# Patient Record
Sex: Female | Born: 1935
Health system: Southern US, Community
[De-identification: ages and names within clinical notes are randomized; demographics above are authoritative.]

## PROBLEM LIST (undated history)

## (undated) DIAGNOSIS — J45909 Unspecified asthma, uncomplicated: Secondary | ICD-10-CM

## (undated) DIAGNOSIS — C50919 Malignant neoplasm of unspecified site of unspecified female breast: Secondary | ICD-10-CM

## (undated) DIAGNOSIS — R609 Edema, unspecified: Secondary | ICD-10-CM

## (undated) DIAGNOSIS — T7840XA Allergy, unspecified, initial encounter: Secondary | ICD-10-CM

## (undated) DIAGNOSIS — I10 Essential (primary) hypertension: Secondary | ICD-10-CM

## (undated) DIAGNOSIS — K219 Gastro-esophageal reflux disease without esophagitis: Secondary | ICD-10-CM

## (undated) DIAGNOSIS — F329 Major depressive disorder, single episode, unspecified: Secondary | ICD-10-CM

## (undated) DIAGNOSIS — E785 Hyperlipidemia, unspecified: Secondary | ICD-10-CM

## (undated) DIAGNOSIS — F32A Depression, unspecified: Secondary | ICD-10-CM

## (undated) DIAGNOSIS — C801 Malignant (primary) neoplasm, unspecified: Secondary | ICD-10-CM

## (undated) DIAGNOSIS — Z923 Personal history of irradiation: Secondary | ICD-10-CM

## (undated) DIAGNOSIS — M109 Gout, unspecified: Secondary | ICD-10-CM

## (undated) HISTORY — DX: Hyperlipidemia, unspecified: E78.5

## (undated) HISTORY — PX: BREAST EXCISIONAL BIOPSY: SUR124

## (undated) HISTORY — DX: Unspecified asthma, uncomplicated: J45.909

## (undated) HISTORY — PX: BREAST BIOPSY: SHX20

## (undated) HISTORY — PX: BREAST LUMPECTOMY: SHX2

## (undated) HISTORY — DX: Gout, unspecified: M10.9

## (undated) HISTORY — DX: Edema, unspecified: R60.9

## (undated) HISTORY — DX: Gastro-esophageal reflux disease without esophagitis: K21.9

## (undated) HISTORY — PX: VAGINAL HYSTERECTOMY: SUR661

## (undated) HISTORY — DX: Essential (primary) hypertension: I10

## (undated) HISTORY — DX: Allergy, unspecified, initial encounter: T78.40XA

## (undated) HISTORY — DX: Depression, unspecified: F32.A

## (undated) HISTORY — DX: Major depressive disorder, single episode, unspecified: F32.9

---

## 2004-01-22 ENCOUNTER — Ambulatory Visit: Payer: Self-pay | Admitting: Internal Medicine

## 2004-04-30 ENCOUNTER — Ambulatory Visit: Payer: Self-pay | Admitting: Internal Medicine

## 2004-05-16 ENCOUNTER — Ambulatory Visit: Payer: Self-pay | Admitting: Internal Medicine

## 2005-02-10 ENCOUNTER — Ambulatory Visit: Payer: Self-pay | Admitting: Internal Medicine

## 2005-02-13 ENCOUNTER — Ambulatory Visit: Payer: Self-pay | Admitting: Internal Medicine

## 2006-01-18 ENCOUNTER — Ambulatory Visit: Payer: Self-pay | Admitting: Internal Medicine

## 2006-02-24 ENCOUNTER — Ambulatory Visit: Payer: Self-pay

## 2006-02-25 ENCOUNTER — Inpatient Hospital Stay: Payer: Self-pay | Admitting: Internal Medicine

## 2006-04-01 ENCOUNTER — Ambulatory Visit: Payer: Self-pay | Admitting: Internal Medicine

## 2006-05-06 ENCOUNTER — Ambulatory Visit: Payer: Self-pay | Admitting: Internal Medicine

## 2007-03-30 ENCOUNTER — Ambulatory Visit: Payer: Self-pay | Admitting: Gastroenterology

## 2007-04-05 ENCOUNTER — Ambulatory Visit: Payer: Self-pay | Admitting: Internal Medicine

## 2007-04-12 ENCOUNTER — Ambulatory Visit: Payer: Self-pay | Admitting: Gastroenterology

## 2008-04-19 ENCOUNTER — Ambulatory Visit: Payer: Self-pay | Admitting: Internal Medicine

## 2008-07-12 ENCOUNTER — Ambulatory Visit: Payer: Self-pay | Admitting: Podiatry

## 2010-03-10 ENCOUNTER — Ambulatory Visit: Payer: Self-pay | Admitting: Family Medicine

## 2010-03-27 ENCOUNTER — Ambulatory Visit: Payer: Self-pay | Admitting: Family Medicine

## 2010-04-09 ENCOUNTER — Ambulatory Visit: Payer: Self-pay | Admitting: Family Medicine

## 2010-04-20 DIAGNOSIS — C801 Malignant (primary) neoplasm, unspecified: Secondary | ICD-10-CM

## 2010-04-20 HISTORY — DX: Malignant (primary) neoplasm, unspecified: C80.1

## 2010-04-29 ENCOUNTER — Ambulatory Visit: Payer: Self-pay | Admitting: Surgery

## 2010-05-06 ENCOUNTER — Ambulatory Visit: Payer: Self-pay | Admitting: Surgery

## 2010-05-23 ENCOUNTER — Ambulatory Visit: Payer: Self-pay | Admitting: Surgery

## 2010-05-23 LAB — PATHOLOGY REPORT

## 2010-05-27 LAB — PATHOLOGY REPORT

## 2010-06-05 ENCOUNTER — Ambulatory Visit: Payer: Self-pay | Admitting: Internal Medicine

## 2010-06-06 LAB — CANCER ANTIGEN 27.29: CA 27.29: 11 U/mL (ref 0.0–38.6)

## 2010-06-19 ENCOUNTER — Ambulatory Visit: Payer: Self-pay | Admitting: Internal Medicine

## 2010-07-20 ENCOUNTER — Ambulatory Visit: Payer: Self-pay | Admitting: Internal Medicine

## 2010-08-19 ENCOUNTER — Ambulatory Visit: Payer: Self-pay | Admitting: Internal Medicine

## 2010-09-05 LAB — CANCER ANTIGEN 27.29: CA 27.29: 16.4 U/mL (ref 0.0–38.6)

## 2010-09-19 ENCOUNTER — Ambulatory Visit: Payer: Self-pay | Admitting: Internal Medicine

## 2010-10-19 ENCOUNTER — Ambulatory Visit: Payer: Self-pay | Admitting: Internal Medicine

## 2010-11-17 ENCOUNTER — Emergency Department: Payer: Self-pay | Admitting: Emergency Medicine

## 2010-11-21 ENCOUNTER — Ambulatory Visit: Payer: Self-pay | Admitting: Gastroenterology

## 2010-11-25 LAB — PATHOLOGY REPORT

## 2011-01-08 ENCOUNTER — Ambulatory Visit: Payer: Self-pay | Admitting: Internal Medicine

## 2011-01-09 LAB — CANCER ANTIGEN 27.29: CA 27.29: 10.3 U/mL (ref 0.0–38.6)

## 2011-01-19 ENCOUNTER — Ambulatory Visit: Payer: Self-pay | Admitting: Internal Medicine

## 2011-01-21 ENCOUNTER — Ambulatory Visit: Payer: Self-pay | Admitting: Surgery

## 2011-01-21 DIAGNOSIS — I10 Essential (primary) hypertension: Secondary | ICD-10-CM

## 2011-01-22 ENCOUNTER — Ambulatory Visit: Payer: Self-pay | Admitting: Internal Medicine

## 2011-01-28 ENCOUNTER — Ambulatory Visit: Payer: Self-pay | Admitting: Surgery

## 2011-03-30 ENCOUNTER — Ambulatory Visit: Payer: Self-pay | Admitting: Internal Medicine

## 2011-04-29 DIAGNOSIS — F329 Major depressive disorder, single episode, unspecified: Secondary | ICD-10-CM | POA: Diagnosis not present

## 2011-04-29 DIAGNOSIS — F4321 Adjustment disorder with depressed mood: Secondary | ICD-10-CM | POA: Diagnosis not present

## 2011-06-23 DIAGNOSIS — M722 Plantar fascial fibromatosis: Secondary | ICD-10-CM | POA: Diagnosis not present

## 2011-06-23 DIAGNOSIS — M216X9 Other acquired deformities of unspecified foot: Secondary | ICD-10-CM | POA: Diagnosis not present

## 2011-06-23 DIAGNOSIS — M76829 Posterior tibial tendinitis, unspecified leg: Secondary | ICD-10-CM | POA: Diagnosis not present

## 2011-07-14 DIAGNOSIS — M76829 Posterior tibial tendinitis, unspecified leg: Secondary | ICD-10-CM | POA: Diagnosis not present

## 2011-07-14 DIAGNOSIS — M216X9 Other acquired deformities of unspecified foot: Secondary | ICD-10-CM | POA: Diagnosis not present

## 2011-07-14 DIAGNOSIS — M722 Plantar fascial fibromatosis: Secondary | ICD-10-CM | POA: Diagnosis not present

## 2011-07-16 ENCOUNTER — Ambulatory Visit: Payer: Self-pay | Admitting: Internal Medicine

## 2011-07-16 DIAGNOSIS — M1990 Unspecified osteoarthritis, unspecified site: Secondary | ICD-10-CM | POA: Diagnosis not present

## 2011-07-16 DIAGNOSIS — I10 Essential (primary) hypertension: Secondary | ICD-10-CM | POA: Diagnosis not present

## 2011-07-16 DIAGNOSIS — M109 Gout, unspecified: Secondary | ICD-10-CM | POA: Diagnosis not present

## 2011-07-16 DIAGNOSIS — J45909 Unspecified asthma, uncomplicated: Secondary | ICD-10-CM | POA: Diagnosis not present

## 2011-07-16 DIAGNOSIS — Z90710 Acquired absence of both cervix and uterus: Secondary | ICD-10-CM | POA: Diagnosis not present

## 2011-07-16 DIAGNOSIS — C50919 Malignant neoplasm of unspecified site of unspecified female breast: Secondary | ICD-10-CM | POA: Diagnosis not present

## 2011-07-16 DIAGNOSIS — Z79899 Other long term (current) drug therapy: Secondary | ICD-10-CM | POA: Diagnosis not present

## 2011-07-16 DIAGNOSIS — Z79811 Long term (current) use of aromatase inhibitors: Secondary | ICD-10-CM | POA: Diagnosis not present

## 2011-07-16 DIAGNOSIS — Z170 Estrogen receptor positive status [ER+]: Secondary | ICD-10-CM | POA: Diagnosis not present

## 2011-07-16 DIAGNOSIS — E669 Obesity, unspecified: Secondary | ICD-10-CM | POA: Diagnosis not present

## 2011-07-16 LAB — CBC CANCER CENTER
Basophil #: 0 x10 3/mm (ref 0.0–0.1)
Basophil %: 0.3 %
Eosinophil #: 0 x10 3/mm (ref 0.0–0.7)
Eosinophil %: 0.3 %
HCT: 37.5 % (ref 35.0–47.0)
HGB: 12.5 g/dL (ref 12.0–16.0)
Lymphocyte #: 2.1 x10 3/mm (ref 1.0–3.6)
Lymphocyte %: 36 %
MCH: 32.1 pg (ref 26.0–34.0)
MCHC: 33.3 g/dL (ref 32.0–36.0)
MCV: 96.5 fL (ref 80–100)
Monocyte #: 0.5 x10 3/mm (ref 0.0–0.7)
Monocyte %: 8.7 %
Neutrophil #: 3.1 x10 3/mm (ref 1.4–6.5)
Neutrophil %: 54.7 %
Platelet: 314 x10 3/mm (ref 150–440)
RBC: 3.89 10*6/uL (ref 3.80–5.20)
RDW: 13 % (ref 11.5–14.5)
WBC: 5.7 x10 3/mm (ref 3.6–11.0)

## 2011-07-16 LAB — COMPREHENSIVE METABOLIC PANEL
Albumin: 3.5 g/dL (ref 3.4–5.0)
Alkaline Phosphatase: 124 U/L (ref 50–136)
Anion Gap: 6 — ABNORMAL LOW (ref 7–16)
BUN: 17 mg/dL (ref 7–18)
Bilirubin,Total: 0.6 mg/dL (ref 0.2–1.0)
Calcium, Total: 8.5 mg/dL (ref 8.5–10.1)
Chloride: 105 mmol/L (ref 98–107)
Co2: 32 mmol/L (ref 21–32)
Creatinine: 0.93 mg/dL (ref 0.60–1.30)
EGFR (African American): >60
EGFR (Non-African Amer.): >60
Glucose: 132 mg/dL — ABNORMAL HIGH (ref 65–99)
Osmolality: 288 (ref 275–301)
Potassium: 3.9 mmol/L (ref 3.5–5.1)
SGOT(AST): 19 U/L (ref 15–37)
SGPT (ALT): 29 U/L
Sodium: 143 mmol/L (ref 136–145)
Total Protein: 6.8 g/dL (ref 6.4–8.2)

## 2011-07-20 ENCOUNTER — Ambulatory Visit: Payer: Self-pay | Admitting: Internal Medicine

## 2011-07-20 LAB — CANCER ANTIGEN 27.29: CA 27.29: 17.9 U/mL (ref 0.0–38.6)

## 2011-08-04 DIAGNOSIS — M76829 Posterior tibial tendinitis, unspecified leg: Secondary | ICD-10-CM | POA: Diagnosis not present

## 2011-08-04 DIAGNOSIS — M722 Plantar fascial fibromatosis: Secondary | ICD-10-CM | POA: Diagnosis not present

## 2011-08-11 DIAGNOSIS — M76829 Posterior tibial tendinitis, unspecified leg: Secondary | ICD-10-CM | POA: Diagnosis not present

## 2011-08-11 DIAGNOSIS — M216X9 Other acquired deformities of unspecified foot: Secondary | ICD-10-CM | POA: Diagnosis not present

## 2011-10-06 DIAGNOSIS — Z961 Presence of intraocular lens: Secondary | ICD-10-CM | POA: Diagnosis not present

## 2011-10-06 DIAGNOSIS — H2510 Age-related nuclear cataract, unspecified eye: Secondary | ICD-10-CM | POA: Diagnosis not present

## 2011-10-07 DIAGNOSIS — C50919 Malignant neoplasm of unspecified site of unspecified female breast: Secondary | ICD-10-CM | POA: Diagnosis not present

## 2011-10-27 DIAGNOSIS — M76829 Posterior tibial tendinitis, unspecified leg: Secondary | ICD-10-CM | POA: Diagnosis not present

## 2011-10-27 DIAGNOSIS — M216X9 Other acquired deformities of unspecified foot: Secondary | ICD-10-CM | POA: Diagnosis not present

## 2011-12-01 DIAGNOSIS — E785 Hyperlipidemia, unspecified: Secondary | ICD-10-CM | POA: Diagnosis not present

## 2011-12-01 DIAGNOSIS — I10 Essential (primary) hypertension: Secondary | ICD-10-CM | POA: Diagnosis not present

## 2011-12-07 DIAGNOSIS — M79609 Pain in unspecified limb: Secondary | ICD-10-CM | POA: Diagnosis not present

## 2011-12-07 DIAGNOSIS — M76829 Posterior tibial tendinitis, unspecified leg: Secondary | ICD-10-CM | POA: Diagnosis not present

## 2011-12-07 DIAGNOSIS — M216X9 Other acquired deformities of unspecified foot: Secondary | ICD-10-CM | POA: Diagnosis not present

## 2012-01-18 ENCOUNTER — Ambulatory Visit: Payer: Self-pay | Admitting: Family Medicine

## 2012-01-18 DIAGNOSIS — F329 Major depressive disorder, single episode, unspecified: Secondary | ICD-10-CM | POA: Diagnosis not present

## 2012-01-18 DIAGNOSIS — E785 Hyperlipidemia, unspecified: Secondary | ICD-10-CM | POA: Diagnosis not present

## 2012-01-18 DIAGNOSIS — J40 Bronchitis, not specified as acute or chronic: Secondary | ICD-10-CM | POA: Diagnosis not present

## 2012-01-18 DIAGNOSIS — I10 Essential (primary) hypertension: Secondary | ICD-10-CM | POA: Diagnosis not present

## 2012-01-18 DIAGNOSIS — M5137 Other intervertebral disc degeneration, lumbosacral region: Secondary | ICD-10-CM | POA: Diagnosis not present

## 2012-04-07 ENCOUNTER — Ambulatory Visit: Payer: Self-pay | Admitting: Surgery

## 2012-04-07 ENCOUNTER — Ambulatory Visit: Payer: Self-pay | Admitting: Family Medicine

## 2012-04-07 DIAGNOSIS — R0989 Other specified symptoms and signs involving the circulatory and respiratory systems: Secondary | ICD-10-CM | POA: Diagnosis not present

## 2012-04-07 DIAGNOSIS — R05 Cough: Secondary | ICD-10-CM | POA: Diagnosis not present

## 2012-04-07 DIAGNOSIS — J45909 Unspecified asthma, uncomplicated: Secondary | ICD-10-CM | POA: Diagnosis not present

## 2012-04-07 DIAGNOSIS — R059 Cough, unspecified: Secondary | ICD-10-CM | POA: Diagnosis not present

## 2012-04-07 DIAGNOSIS — J40 Bronchitis, not specified as acute or chronic: Secondary | ICD-10-CM | POA: Diagnosis not present

## 2012-04-07 DIAGNOSIS — R928 Other abnormal and inconclusive findings on diagnostic imaging of breast: Secondary | ICD-10-CM | POA: Diagnosis not present

## 2012-04-07 DIAGNOSIS — Z853 Personal history of malignant neoplasm of breast: Secondary | ICD-10-CM | POA: Diagnosis not present

## 2012-04-08 ENCOUNTER — Emergency Department: Payer: Self-pay | Admitting: Emergency Medicine

## 2012-04-08 DIAGNOSIS — R05 Cough: Secondary | ICD-10-CM | POA: Diagnosis not present

## 2012-04-08 DIAGNOSIS — R0602 Shortness of breath: Secondary | ICD-10-CM | POA: Diagnosis not present

## 2012-04-08 DIAGNOSIS — R911 Solitary pulmonary nodule: Secondary | ICD-10-CM | POA: Diagnosis not present

## 2012-04-08 DIAGNOSIS — Z79899 Other long term (current) drug therapy: Secondary | ICD-10-CM | POA: Diagnosis not present

## 2012-04-08 DIAGNOSIS — J189 Pneumonia, unspecified organism: Secondary | ICD-10-CM | POA: Diagnosis not present

## 2012-04-08 DIAGNOSIS — Z853 Personal history of malignant neoplasm of breast: Secondary | ICD-10-CM | POA: Diagnosis not present

## 2012-04-08 LAB — CBC
HCT: 38 % (ref 35.0–47.0)
HGB: 12.7 g/dL (ref 12.0–16.0)
MCH: 32 pg (ref 26.0–34.0)
MCHC: 33.5 g/dL (ref 32.0–36.0)
MCV: 95 fL (ref 80–100)
Platelet: 249 10*3/uL (ref 150–440)
RBC: 3.98 10*6/uL (ref 3.80–5.20)
RDW: 13.9 % (ref 11.5–14.5)
WBC: 4.8 10*3/uL (ref 3.6–11.0)

## 2012-04-08 LAB — COMPREHENSIVE METABOLIC PANEL
Albumin: 4.1 g/dL (ref 3.4–5.0)
Alkaline Phosphatase: 118 U/L (ref 50–136)
Anion Gap: 7 (ref 7–16)
BUN: 10 mg/dL (ref 7–18)
Bilirubin,Total: 0.8 mg/dL (ref 0.2–1.0)
Calcium, Total: 8.7 mg/dL (ref 8.5–10.1)
Chloride: 102 mmol/L (ref 98–107)
Co2: 28 mmol/L (ref 21–32)
Creatinine: 0.77 mg/dL (ref 0.60–1.30)
EGFR (African American): >60
EGFR (Non-African Amer.): >60
Glucose: 104 mg/dL — ABNORMAL HIGH (ref 65–99)
Osmolality: 273 (ref 275–301)
Potassium: 3.6 mmol/L (ref 3.5–5.1)
SGOT(AST): 28 U/L (ref 15–37)
SGPT (ALT): 25 U/L (ref 12–78)
Sodium: 137 mmol/L (ref 136–145)
Total Protein: 7.4 g/dL (ref 6.4–8.2)

## 2012-04-08 LAB — TROPONIN I: Troponin-I: <0.02 ng/mL

## 2012-04-08 LAB — PRO B NATRIURETIC PEPTIDE: B-Type Natriuretic Peptide: 109 pg/mL (ref 0–450)

## 2012-04-11 DIAGNOSIS — J189 Pneumonia, unspecified organism: Secondary | ICD-10-CM | POA: Diagnosis not present

## 2012-04-15 DIAGNOSIS — C50919 Malignant neoplasm of unspecified site of unspecified female breast: Secondary | ICD-10-CM | POA: Diagnosis not present

## 2012-04-15 DIAGNOSIS — N6019 Diffuse cystic mastopathy of unspecified breast: Secondary | ICD-10-CM | POA: Diagnosis not present

## 2012-04-15 DIAGNOSIS — N63 Unspecified lump in unspecified breast: Secondary | ICD-10-CM | POA: Diagnosis not present

## 2012-04-25 ENCOUNTER — Ambulatory Visit: Payer: Self-pay | Admitting: Family Medicine

## 2012-04-25 DIAGNOSIS — J984 Other disorders of lung: Secondary | ICD-10-CM | POA: Diagnosis not present

## 2012-04-25 DIAGNOSIS — J9819 Other pulmonary collapse: Secondary | ICD-10-CM | POA: Diagnosis not present

## 2012-04-25 DIAGNOSIS — R918 Other nonspecific abnormal finding of lung field: Secondary | ICD-10-CM | POA: Diagnosis not present

## 2012-06-30 DIAGNOSIS — J218 Acute bronchiolitis due to other specified organisms: Secondary | ICD-10-CM | POA: Diagnosis not present

## 2012-08-18 ENCOUNTER — Ambulatory Visit: Payer: Self-pay | Admitting: Internal Medicine

## 2012-08-18 DIAGNOSIS — R911 Solitary pulmonary nodule: Secondary | ICD-10-CM | POA: Diagnosis not present

## 2012-08-18 DIAGNOSIS — I10 Essential (primary) hypertension: Secondary | ICD-10-CM | POA: Diagnosis not present

## 2012-08-18 DIAGNOSIS — Z170 Estrogen receptor positive status [ER+]: Secondary | ICD-10-CM | POA: Diagnosis not present

## 2012-08-18 DIAGNOSIS — Z79899 Other long term (current) drug therapy: Secondary | ICD-10-CM | POA: Diagnosis not present

## 2012-08-18 DIAGNOSIS — M1710 Unilateral primary osteoarthritis, unspecified knee: Secondary | ICD-10-CM | POA: Diagnosis not present

## 2012-08-18 DIAGNOSIS — E669 Obesity, unspecified: Secondary | ICD-10-CM | POA: Diagnosis not present

## 2012-08-18 DIAGNOSIS — J45909 Unspecified asthma, uncomplicated: Secondary | ICD-10-CM | POA: Diagnosis not present

## 2012-08-18 DIAGNOSIS — Z79811 Long term (current) use of aromatase inhibitors: Secondary | ICD-10-CM | POA: Diagnosis not present

## 2012-08-18 DIAGNOSIS — R635 Abnormal weight gain: Secondary | ICD-10-CM | POA: Diagnosis not present

## 2012-08-18 DIAGNOSIS — M1990 Unspecified osteoarthritis, unspecified site: Secondary | ICD-10-CM | POA: Diagnosis not present

## 2012-08-18 DIAGNOSIS — C50919 Malignant neoplasm of unspecified site of unspecified female breast: Secondary | ICD-10-CM | POA: Diagnosis not present

## 2012-08-18 DIAGNOSIS — M109 Gout, unspecified: Secondary | ICD-10-CM | POA: Diagnosis not present

## 2012-09-15 DIAGNOSIS — C50919 Malignant neoplasm of unspecified site of unspecified female breast: Secondary | ICD-10-CM | POA: Diagnosis not present

## 2012-09-15 DIAGNOSIS — Z170 Estrogen receptor positive status [ER+]: Secondary | ICD-10-CM | POA: Diagnosis not present

## 2012-09-15 DIAGNOSIS — Z79811 Long term (current) use of aromatase inhibitors: Secondary | ICD-10-CM | POA: Diagnosis not present

## 2012-09-15 DIAGNOSIS — R635 Abnormal weight gain: Secondary | ICD-10-CM | POA: Diagnosis not present

## 2012-09-15 LAB — CREATININE, SERUM
Creatinine: 0.82 mg/dL (ref 0.60–1.30)
EGFR (African American): >60
EGFR (Non-African Amer.): >60

## 2012-09-15 LAB — CBC CANCER CENTER
Basophil #: 0 x10 3/mm (ref 0.0–0.1)
Basophil %: 0.6 %
Eosinophil #: 0.3 x10 3/mm (ref 0.0–0.7)
Eosinophil %: 5.1 %
HCT: 36.9 % (ref 35.0–47.0)
HGB: 12.5 g/dL (ref 12.0–16.0)
Lymphocyte #: 1.9 x10 3/mm (ref 1.0–3.6)
Lymphocyte %: 35.3 %
MCH: 31.8 pg (ref 26.0–34.0)
MCHC: 34 g/dL (ref 32.0–36.0)
MCV: 94 fL (ref 80–100)
Monocyte #: 0.4 x10 3/mm (ref 0.2–0.9)
Monocyte %: 7.3 %
Neutrophil #: 2.8 x10 3/mm (ref 1.4–6.5)
Neutrophil %: 51.7 %
Platelet: 265 x10 3/mm (ref 150–440)
RBC: 3.95 10*6/uL (ref 3.80–5.20)
RDW: 13.9 % (ref 11.5–14.5)
WBC: 5.4 x10 3/mm (ref 3.6–11.0)

## 2012-09-15 LAB — HEPATIC FUNCTION PANEL A (ARMC)
Albumin: 3.5 g/dL (ref 3.4–5.0)
Alkaline Phosphatase: 106 U/L (ref 50–136)
Bilirubin, Direct: 0.1 mg/dL (ref 0.00–0.20)
Bilirubin,Total: 0.6 mg/dL (ref 0.2–1.0)
SGOT(AST): 18 U/L (ref 15–37)
SGPT (ALT): 22 U/L (ref 12–78)
Total Protein: 7 g/dL (ref 6.4–8.2)

## 2012-09-15 LAB — TSH: Thyroid Stimulating Horm: 2.39 u[IU]/mL

## 2012-09-15 LAB — T4, FREE: Free Thyroxine: 0.67 ng/dL — ABNORMAL LOW (ref 0.76–1.46)

## 2012-09-16 LAB — CANCER ANTIGEN 27.29: CA 27.29: 15.3 U/mL (ref 0.0–38.6)

## 2012-09-18 ENCOUNTER — Ambulatory Visit: Payer: Self-pay | Admitting: Internal Medicine

## 2012-09-18 DIAGNOSIS — Z170 Estrogen receptor positive status [ER+]: Secondary | ICD-10-CM | POA: Diagnosis not present

## 2012-09-18 DIAGNOSIS — C50919 Malignant neoplasm of unspecified site of unspecified female breast: Secondary | ICD-10-CM | POA: Diagnosis not present

## 2012-09-18 DIAGNOSIS — Z79811 Long term (current) use of aromatase inhibitors: Secondary | ICD-10-CM | POA: Diagnosis not present

## 2012-09-18 DIAGNOSIS — Z13820 Encounter for screening for osteoporosis: Secondary | ICD-10-CM | POA: Diagnosis not present

## 2012-09-22 DIAGNOSIS — Z79899 Other long term (current) drug therapy: Secondary | ICD-10-CM | POA: Diagnosis not present

## 2012-10-17 DIAGNOSIS — N3090 Cystitis, unspecified without hematuria: Secondary | ICD-10-CM | POA: Diagnosis not present

## 2012-10-18 ENCOUNTER — Ambulatory Visit: Payer: Self-pay | Admitting: Internal Medicine

## 2012-11-29 DIAGNOSIS — B372 Candidiasis of skin and nail: Secondary | ICD-10-CM | POA: Diagnosis not present

## 2012-11-29 DIAGNOSIS — M65839 Other synovitis and tenosynovitis, unspecified forearm: Secondary | ICD-10-CM | POA: Diagnosis not present

## 2012-12-13 ENCOUNTER — Ambulatory Visit: Payer: Self-pay | Admitting: Family Medicine

## 2012-12-13 DIAGNOSIS — M25569 Pain in unspecified knee: Secondary | ICD-10-CM | POA: Diagnosis not present

## 2012-12-13 DIAGNOSIS — M65839 Other synovitis and tenosynovitis, unspecified forearm: Secondary | ICD-10-CM | POA: Diagnosis not present

## 2012-12-13 DIAGNOSIS — M25069 Hemarthrosis, unspecified knee: Secondary | ICD-10-CM | POA: Diagnosis not present

## 2012-12-16 DIAGNOSIS — M23329 Other meniscus derangements, posterior horn of medial meniscus, unspecified knee: Secondary | ICD-10-CM | POA: Diagnosis not present

## 2012-12-16 DIAGNOSIS — M1710 Unilateral primary osteoarthritis, unspecified knee: Secondary | ICD-10-CM | POA: Diagnosis not present

## 2012-12-21 ENCOUNTER — Ambulatory Visit: Payer: Self-pay | Admitting: Unknown Physician Specialty

## 2012-12-21 DIAGNOSIS — M25469 Effusion, unspecified knee: Secondary | ICD-10-CM | POA: Diagnosis not present

## 2012-12-21 DIAGNOSIS — M899 Disorder of bone, unspecified: Secondary | ICD-10-CM | POA: Diagnosis not present

## 2012-12-21 DIAGNOSIS — IMO0002 Reserved for concepts with insufficient information to code with codable children: Secondary | ICD-10-CM | POA: Diagnosis not present

## 2012-12-26 DIAGNOSIS — M84453A Pathological fracture, unspecified femur, initial encounter for fracture: Secondary | ICD-10-CM | POA: Diagnosis not present

## 2012-12-26 DIAGNOSIS — M1710 Unilateral primary osteoarthritis, unspecified knee: Secondary | ICD-10-CM | POA: Diagnosis not present

## 2013-01-24 DIAGNOSIS — M84453A Pathological fracture, unspecified femur, initial encounter for fracture: Secondary | ICD-10-CM | POA: Diagnosis not present

## 2013-03-24 DIAGNOSIS — Z23 Encounter for immunization: Secondary | ICD-10-CM | POA: Diagnosis not present

## 2013-03-28 DIAGNOSIS — M19049 Primary osteoarthritis, unspecified hand: Secondary | ICD-10-CM | POA: Diagnosis not present

## 2013-03-28 DIAGNOSIS — M84453A Pathological fracture, unspecified femur, initial encounter for fracture: Secondary | ICD-10-CM | POA: Diagnosis not present

## 2013-03-28 DIAGNOSIS — M654 Radial styloid tenosynovitis [de Quervain]: Secondary | ICD-10-CM | POA: Diagnosis not present

## 2013-04-11 ENCOUNTER — Ambulatory Visit: Payer: Self-pay | Admitting: Surgery

## 2013-04-11 DIAGNOSIS — R920 Mammographic microcalcification found on diagnostic imaging of breast: Secondary | ICD-10-CM | POA: Diagnosis not present

## 2013-04-11 DIAGNOSIS — R922 Inconclusive mammogram: Secondary | ICD-10-CM | POA: Diagnosis not present

## 2013-04-11 DIAGNOSIS — N63 Unspecified lump in unspecified breast: Secondary | ICD-10-CM | POA: Diagnosis not present

## 2013-04-24 DIAGNOSIS — Z853 Personal history of malignant neoplasm of breast: Secondary | ICD-10-CM | POA: Diagnosis not present

## 2013-05-04 DIAGNOSIS — I10 Essential (primary) hypertension: Secondary | ICD-10-CM | POA: Diagnosis not present

## 2013-05-04 DIAGNOSIS — K219 Gastro-esophageal reflux disease without esophagitis: Secondary | ICD-10-CM | POA: Diagnosis not present

## 2013-05-04 DIAGNOSIS — B372 Candidiasis of skin and nail: Secondary | ICD-10-CM | POA: Diagnosis not present

## 2013-05-04 DIAGNOSIS — E785 Hyperlipidemia, unspecified: Secondary | ICD-10-CM | POA: Diagnosis not present

## 2013-07-17 ENCOUNTER — Ambulatory Visit: Payer: Self-pay | Admitting: Family Medicine

## 2013-07-17 DIAGNOSIS — R059 Cough, unspecified: Secondary | ICD-10-CM | POA: Diagnosis not present

## 2013-07-17 DIAGNOSIS — J45909 Unspecified asthma, uncomplicated: Secondary | ICD-10-CM | POA: Diagnosis not present

## 2013-07-17 DIAGNOSIS — R911 Solitary pulmonary nodule: Secondary | ICD-10-CM | POA: Diagnosis not present

## 2013-07-17 DIAGNOSIS — E669 Obesity, unspecified: Secondary | ICD-10-CM | POA: Diagnosis not present

## 2013-07-17 DIAGNOSIS — J218 Acute bronchiolitis due to other specified organisms: Secondary | ICD-10-CM | POA: Diagnosis not present

## 2013-08-01 ENCOUNTER — Ambulatory Visit: Payer: Self-pay | Admitting: Family Medicine

## 2013-08-01 DIAGNOSIS — M479 Spondylosis, unspecified: Secondary | ICD-10-CM | POA: Diagnosis not present

## 2013-08-01 DIAGNOSIS — F329 Major depressive disorder, single episode, unspecified: Secondary | ICD-10-CM | POA: Diagnosis not present

## 2013-08-01 DIAGNOSIS — F3289 Other specified depressive episodes: Secondary | ICD-10-CM | POA: Diagnosis not present

## 2013-08-01 DIAGNOSIS — J218 Acute bronchiolitis due to other specified organisms: Secondary | ICD-10-CM | POA: Diagnosis not present

## 2013-08-01 DIAGNOSIS — R5381 Other malaise: Secondary | ICD-10-CM | POA: Diagnosis not present

## 2013-08-01 DIAGNOSIS — R5383 Other fatigue: Secondary | ICD-10-CM | POA: Diagnosis not present

## 2013-08-01 DIAGNOSIS — M5137 Other intervertebral disc degeneration, lumbosacral region: Secondary | ICD-10-CM | POA: Diagnosis not present

## 2013-10-19 ENCOUNTER — Ambulatory Visit: Payer: Self-pay | Admitting: Internal Medicine

## 2013-10-19 DIAGNOSIS — E669 Obesity, unspecified: Secondary | ICD-10-CM | POA: Diagnosis not present

## 2013-10-19 DIAGNOSIS — Z170 Estrogen receptor positive status [ER+]: Secondary | ICD-10-CM | POA: Diagnosis not present

## 2013-10-19 DIAGNOSIS — J45909 Unspecified asthma, uncomplicated: Secondary | ICD-10-CM | POA: Diagnosis not present

## 2013-10-19 DIAGNOSIS — M1710 Unilateral primary osteoarthritis, unspecified knee: Secondary | ICD-10-CM | POA: Diagnosis not present

## 2013-10-19 DIAGNOSIS — M1990 Unspecified osteoarthritis, unspecified site: Secondary | ICD-10-CM | POA: Diagnosis not present

## 2013-10-19 DIAGNOSIS — I10 Essential (primary) hypertension: Secondary | ICD-10-CM | POA: Diagnosis not present

## 2013-10-19 DIAGNOSIS — Z79811 Long term (current) use of aromatase inhibitors: Secondary | ICD-10-CM | POA: Diagnosis not present

## 2013-10-19 DIAGNOSIS — M109 Gout, unspecified: Secondary | ICD-10-CM | POA: Diagnosis not present

## 2013-10-19 DIAGNOSIS — Z79899 Other long term (current) drug therapy: Secondary | ICD-10-CM | POA: Diagnosis not present

## 2013-10-19 DIAGNOSIS — C50919 Malignant neoplasm of unspecified site of unspecified female breast: Secondary | ICD-10-CM | POA: Diagnosis not present

## 2013-10-19 LAB — CBC CANCER CENTER
Basophil #: 0.1 x10 3/mm (ref 0.0–0.1)
Basophil %: 1.2 %
Eosinophil #: 0.4 x10 3/mm (ref 0.0–0.7)
Eosinophil %: 5.9 %
HCT: 38.1 % (ref 35.0–47.0)
HGB: 12.8 g/dL (ref 12.0–16.0)
Lymphocyte #: 1.8 x10 3/mm (ref 1.0–3.6)
Lymphocyte %: 28.8 %
MCH: 32.3 pg (ref 26.0–34.0)
MCHC: 33.7 g/dL (ref 32.0–36.0)
MCV: 96 fL (ref 80–100)
Monocyte #: 0.4 x10 3/mm (ref 0.2–0.9)
Monocyte %: 7 %
Neutrophil #: 3.6 x10 3/mm (ref 1.4–6.5)
Neutrophil %: 57.1 %
Platelet: 301 x10 3/mm (ref 150–440)
RBC: 3.98 10*6/uL (ref 3.80–5.20)
RDW: 13.5 % (ref 11.5–14.5)
WBC: 6.3 x10 3/mm (ref 3.6–11.0)

## 2013-10-19 LAB — HEPATIC FUNCTION PANEL A (ARMC)
Albumin: 3.5 g/dL (ref 3.4–5.0)
Alkaline Phosphatase: 97 U/L
Bilirubin, Direct: 0.1 mg/dL (ref 0.00–0.20)
Bilirubin,Total: 0.6 mg/dL (ref 0.2–1.0)
SGOT(AST): 18 U/L (ref 15–37)
SGPT (ALT): 20 U/L (ref 12–78)
Total Protein: 7.2 g/dL (ref 6.4–8.2)

## 2013-10-19 LAB — CREATININE, SERUM
Creatinine: 0.8 mg/dL (ref 0.60–1.30)
EGFR (African American): >60
EGFR (Non-African Amer.): >60

## 2013-11-18 ENCOUNTER — Ambulatory Visit: Payer: Self-pay | Admitting: Internal Medicine

## 2013-12-19 DIAGNOSIS — B373 Candidiasis of vulva and vagina: Secondary | ICD-10-CM | POA: Diagnosis not present

## 2013-12-19 DIAGNOSIS — B3731 Acute candidiasis of vulva and vagina: Secondary | ICD-10-CM | POA: Diagnosis not present

## 2013-12-19 DIAGNOSIS — N3090 Cystitis, unspecified without hematuria: Secondary | ICD-10-CM | POA: Diagnosis not present

## 2014-01-26 DIAGNOSIS — Z23 Encounter for immunization: Secondary | ICD-10-CM | POA: Diagnosis not present

## 2014-03-07 DIAGNOSIS — Z86010 Personal history of colonic polyps: Secondary | ICD-10-CM | POA: Diagnosis not present

## 2014-03-22 DIAGNOSIS — M109 Gout, unspecified: Secondary | ICD-10-CM | POA: Diagnosis not present

## 2014-03-22 DIAGNOSIS — I10 Essential (primary) hypertension: Secondary | ICD-10-CM | POA: Diagnosis not present

## 2014-03-22 DIAGNOSIS — F329 Major depressive disorder, single episode, unspecified: Secondary | ICD-10-CM | POA: Diagnosis not present

## 2014-03-22 DIAGNOSIS — E784 Other hyperlipidemia: Secondary | ICD-10-CM | POA: Diagnosis not present

## 2014-03-22 DIAGNOSIS — K219 Gastro-esophageal reflux disease without esophagitis: Secondary | ICD-10-CM | POA: Diagnosis not present

## 2014-03-27 ENCOUNTER — Ambulatory Visit: Payer: Self-pay | Admitting: Gastroenterology

## 2014-03-27 DIAGNOSIS — Z86010 Personal history of colonic polyps: Secondary | ICD-10-CM | POA: Diagnosis not present

## 2014-03-27 DIAGNOSIS — J45909 Unspecified asthma, uncomplicated: Secondary | ICD-10-CM | POA: Diagnosis not present

## 2014-03-27 DIAGNOSIS — Z853 Personal history of malignant neoplasm of breast: Secondary | ICD-10-CM | POA: Diagnosis not present

## 2014-03-27 DIAGNOSIS — D123 Benign neoplasm of transverse colon: Secondary | ICD-10-CM | POA: Diagnosis not present

## 2014-03-27 DIAGNOSIS — D124 Benign neoplasm of descending colon: Secondary | ICD-10-CM | POA: Diagnosis not present

## 2014-03-27 DIAGNOSIS — I10 Essential (primary) hypertension: Secondary | ICD-10-CM | POA: Diagnosis not present

## 2014-03-27 DIAGNOSIS — K5730 Diverticulosis of large intestine without perforation or abscess without bleeding: Secondary | ICD-10-CM | POA: Diagnosis not present

## 2014-03-27 DIAGNOSIS — K219 Gastro-esophageal reflux disease without esophagitis: Secondary | ICD-10-CM | POA: Diagnosis not present

## 2014-03-27 DIAGNOSIS — E669 Obesity, unspecified: Secondary | ICD-10-CM | POA: Diagnosis not present

## 2014-04-11 DIAGNOSIS — J40 Bronchitis, not specified as acute or chronic: Secondary | ICD-10-CM | POA: Diagnosis not present

## 2014-04-17 ENCOUNTER — Ambulatory Visit: Payer: Self-pay | Admitting: Surgery

## 2014-04-17 DIAGNOSIS — Z853 Personal history of malignant neoplasm of breast: Secondary | ICD-10-CM | POA: Diagnosis not present

## 2014-04-17 DIAGNOSIS — R922 Inconclusive mammogram: Secondary | ICD-10-CM | POA: Diagnosis not present

## 2014-08-13 LAB — SURGICAL PATHOLOGY

## 2014-09-21 ENCOUNTER — Other Ambulatory Visit: Payer: Self-pay | Admitting: Family Medicine

## 2014-10-24 ENCOUNTER — Other Ambulatory Visit: Payer: Self-pay

## 2014-10-24 DIAGNOSIS — C50911 Malignant neoplasm of unspecified site of right female breast: Secondary | ICD-10-CM

## 2014-10-25 ENCOUNTER — Other Ambulatory Visit: Payer: Self-pay

## 2014-10-25 ENCOUNTER — Ambulatory Visit (INDEPENDENT_AMBULATORY_CARE_PROVIDER_SITE_OTHER): Payer: Medicare Other | Admitting: Family Medicine

## 2014-10-25 ENCOUNTER — Encounter: Payer: Self-pay | Admitting: Family Medicine

## 2014-10-25 ENCOUNTER — Ambulatory Visit (HOSPITAL_BASED_OUTPATIENT_CLINIC_OR_DEPARTMENT_OTHER): Payer: Medicare Other | Admitting: Internal Medicine

## 2014-10-25 ENCOUNTER — Inpatient Hospital Stay: Payer: Medicare Other | Attending: Internal Medicine

## 2014-10-25 VITALS — BP 120/80 | HR 72 | Ht 63.0 in | Wt 228.0 lb

## 2014-10-25 VITALS — BP 143/71 | HR 81 | Temp 98.3°F | Resp 18 | Ht 63.0 in | Wt 227.5 lb

## 2014-10-25 DIAGNOSIS — M109 Gout, unspecified: Secondary | ICD-10-CM

## 2014-10-25 DIAGNOSIS — Z79899 Other long term (current) drug therapy: Secondary | ICD-10-CM

## 2014-10-25 DIAGNOSIS — F329 Major depressive disorder, single episode, unspecified: Secondary | ICD-10-CM

## 2014-10-25 DIAGNOSIS — E790 Hyperuricemia without signs of inflammatory arthritis and tophaceous disease: Secondary | ICD-10-CM | POA: Diagnosis not present

## 2014-10-25 DIAGNOSIS — E669 Obesity, unspecified: Secondary | ICD-10-CM | POA: Insufficient documentation

## 2014-10-25 DIAGNOSIS — E7849 Other hyperlipidemia: Secondary | ICD-10-CM | POA: Insufficient documentation

## 2014-10-25 DIAGNOSIS — M170 Bilateral primary osteoarthritis of knee: Secondary | ICD-10-CM | POA: Diagnosis not present

## 2014-10-25 DIAGNOSIS — C50911 Malignant neoplasm of unspecified site of right female breast: Secondary | ICD-10-CM | POA: Diagnosis not present

## 2014-10-25 DIAGNOSIS — I10 Essential (primary) hypertension: Secondary | ICD-10-CM

## 2014-10-25 DIAGNOSIS — E785 Hyperlipidemia, unspecified: Secondary | ICD-10-CM

## 2014-10-25 DIAGNOSIS — J301 Allergic rhinitis due to pollen: Secondary | ICD-10-CM | POA: Diagnosis not present

## 2014-10-25 DIAGNOSIS — K219 Gastro-esophageal reflux disease without esophagitis: Secondary | ICD-10-CM | POA: Diagnosis not present

## 2014-10-25 DIAGNOSIS — F32A Depression, unspecified: Secondary | ICD-10-CM | POA: Insufficient documentation

## 2014-10-25 DIAGNOSIS — Z87891 Personal history of nicotine dependence: Secondary | ICD-10-CM | POA: Insufficient documentation

## 2014-10-25 DIAGNOSIS — Z90710 Acquired absence of both cervix and uterus: Secondary | ICD-10-CM

## 2014-10-25 DIAGNOSIS — M1710 Unilateral primary osteoarthritis, unspecified knee: Secondary | ICD-10-CM | POA: Insufficient documentation

## 2014-10-25 DIAGNOSIS — Z7982 Long term (current) use of aspirin: Secondary | ICD-10-CM

## 2014-10-25 DIAGNOSIS — E784 Other hyperlipidemia: Secondary | ICD-10-CM | POA: Diagnosis not present

## 2014-10-25 DIAGNOSIS — Z13820 Encounter for screening for osteoporosis: Secondary | ICD-10-CM

## 2014-10-25 DIAGNOSIS — Z170 Estrogen receptor positive status [ER+]: Secondary | ICD-10-CM | POA: Insufficient documentation

## 2014-10-25 DIAGNOSIS — K922 Gastrointestinal hemorrhage, unspecified: Secondary | ICD-10-CM | POA: Insufficient documentation

## 2014-10-25 DIAGNOSIS — F4321 Adjustment disorder with depressed mood: Secondary | ICD-10-CM | POA: Insufficient documentation

## 2014-10-25 DIAGNOSIS — Z79811 Long term (current) use of aromatase inhibitors: Secondary | ICD-10-CM | POA: Diagnosis not present

## 2014-10-25 DIAGNOSIS — Z8739 Personal history of other diseases of the musculoskeletal system and connective tissue: Secondary | ICD-10-CM | POA: Insufficient documentation

## 2014-10-25 DIAGNOSIS — M1990 Unspecified osteoarthritis, unspecified site: Secondary | ICD-10-CM | POA: Insufficient documentation

## 2014-10-25 DIAGNOSIS — J45909 Unspecified asthma, uncomplicated: Secondary | ICD-10-CM | POA: Insufficient documentation

## 2014-10-25 DIAGNOSIS — J4520 Mild intermittent asthma, uncomplicated: Secondary | ICD-10-CM

## 2014-10-25 DIAGNOSIS — R609 Edema, unspecified: Secondary | ICD-10-CM

## 2014-10-25 LAB — HEPATIC FUNCTION PANEL
ALBUMIN: 4 g/dL (ref 3.5–5.0)
ALK PHOS: 90 U/L (ref 38–126)
ALT: 13 U/L — AB (ref 14–54)
AST: 19 U/L (ref 15–41)
BILIRUBIN TOTAL: 0.7 mg/dL (ref 0.3–1.2)
Total Protein: 7.1 g/dL (ref 6.5–8.1)

## 2014-10-25 LAB — CBC WITH DIFFERENTIAL/PLATELET
Basophils Absolute: 0 10*3/uL (ref 0–0.1)
Basophils Relative: 1 %
EOS ABS: 0.3 10*3/uL (ref 0–0.7)
Eosinophils Relative: 5 %
HCT: 37.2 % (ref 35.0–47.0)
Hemoglobin: 12.5 g/dL (ref 12.0–16.0)
Lymphocytes Relative: 34 %
Lymphs Abs: 1.9 10*3/uL (ref 1.0–3.6)
MCH: 31.1 pg (ref 26.0–34.0)
MCHC: 33.5 g/dL (ref 32.0–36.0)
MCV: 92.9 fL (ref 80.0–100.0)
Monocytes Absolute: 0.4 10*3/uL (ref 0.2–0.9)
Monocytes Relative: 7 %
Neutro Abs: 3.1 10*3/uL (ref 1.4–6.5)
Neutrophils Relative %: 53 %
PLATELETS: 286 10*3/uL (ref 150–440)
RBC: 4 MIL/uL (ref 3.80–5.20)
RDW: 14 % (ref 11.5–14.5)
WBC: 5.7 10*3/uL (ref 3.6–11.0)

## 2014-10-25 LAB — CREATININE, SERUM: Creatinine, Ser: 0.71 mg/dL (ref 0.44–1.00)

## 2014-10-25 MED ORDER — LORATADINE 10 MG PO TABS: 10.0000 mg | ORAL_TABLET | Freq: Every day | ORAL | Status: DC

## 2014-10-25 MED ORDER — FLUTICASONE-SALMETEROL 100-50 MCG/DOSE IN AEPB
1.0000 | INHALATION_SPRAY | Freq: Two times a day (BID) | RESPIRATORY_TRACT | Status: DC
Start: 2014-10-25 — End: 2015-05-20

## 2014-10-25 MED ORDER — FUROSEMIDE 40 MG PO TABS: 40.0000 mg | ORAL_TABLET | Freq: Every day | ORAL | Status: DC

## 2014-10-25 MED ORDER — ALLOPURINOL 100 MG PO TABS: 100.0000 mg | ORAL_TABLET | Freq: Every day | ORAL | Status: DC

## 2014-10-25 MED ORDER — OMEPRAZOLE 20 MG PO CPDR: 20.0000 mg | DELAYED_RELEASE_CAPSULE | Freq: Every day | ORAL | Status: DC

## 2014-10-25 MED ORDER — SERTRALINE HCL 50 MG PO TABS: 50.0000 mg | ORAL_TABLET | Freq: Every day | ORAL | Status: DC

## 2014-10-25 MED ORDER — LOVASTATIN 40 MG PO TABS: 40.0000 mg | ORAL_TABLET | Freq: Every day | ORAL | Status: DC

## 2014-10-25 MED ORDER — NIACIN ER (ANTIHYPERLIPIDEMIC) 1000 MG PO TBCR: 1000.0000 mg | EXTENDED_RELEASE_TABLET | Freq: Every day | ORAL | Status: DC

## 2014-10-25 MED ORDER — LOSARTAN POTASSIUM 100 MG PO TABS: 100.0000 mg | ORAL_TABLET | Freq: Every day | ORAL | Status: DC

## 2014-10-25 NOTE — Progress Notes (Signed)
Name: Kirsten Snyder   MRN: [id number]    DOB: 26-Dec-1933   Date:10/25/2014       Progress Note  Subjective  Chief Complaint  Chief Complaint  Patient presents with  . Hypertension  . Hyperlipidemia  . Foot Swelling    takes Furosemide  . Allergic Rhinitis   . Gastrophageal Reflux  . Depression  . Gout    Hypertension This is a recurrent problem. The current episode started more than 1 year ago. The problem has been gradually worsening since onset. Pertinent negatives include no anxiety, blurred vision, chest pain, headaches, malaise/fatigue, neck pain, orthopnea, palpitations, peripheral edema, PND, shortness of breath or sweats. There are no associated agents to hypertension. Risk factors for coronary artery disease include diabetes mellitus and dyslipidemia. The current treatment provides moderate improvement. There are no compliance problems.  There is no history of angina, kidney disease, CAD/MI, CVA, heart failure, left ventricular hypertrophy, PVD or retinopathy. There is no history of chronic renal disease.  Hyperlipidemia This is a chronic problem. The current episode started more than 1 year ago. Recent lipid tests were reviewed and are low. She has no history of chronic renal disease, diabetes, hypothyroidism, liver disease or nephrotic syndrome. Pertinent negatives include no chest pain, focal weakness, myalgias or shortness of breath. There are no compliance problems.   Gastrophageal Reflux She reports no abdominal pain, no belching, no chest pain, no choking, no coughing, no dysphagia, no early satiety, no globus sensation, no heartburn, no hoarse voice, no nausea, no sore throat, no stridor, no tooth decay, no water brash or no wheezing. This is a recurrent problem. The current episode started more than 1 year ago. The problem occurs frequently. The problem has been gradually improving. Nothing aggravates the symptoms. Associated symptoms include fatigue. Pertinent negatives  include no melena or weight loss. She has tried a PPI for the symptoms. The treatment provided moderate relief.  Mental Health Problem The primary symptoms include dysphoric mood. The primary symptoms do not include delusions, hallucinations, bizarre behavior, disorganized speech, negative symptoms or somatic symptoms. The current episode started more than 1 month ago. This is a chronic problem.  The dysphoric mood began more than 2 weeks ago. The mood has been improving since its onset. The mood includes feelings of emptiness. Her change in mood was precipitated by the death of a loved one.  The onset of the illness is precipitated by a stressful event and emotional stress. The degree of incapacity that she is experiencing as a consequence of her illness is mild. Additional symptoms of the illness include fatigue. Additional symptoms of the illness do not include no anhedonia, no insomnia, no attention impairment, no euphoric mood, no headaches or no abdominal pain. She does not admit to suicidal ideas.    No problem-specific assessment & plan notes found for this encounter.   Past Medical History  Diagnosis Date  . Allergy   . Depression   . GERD (gastroesophageal reflux disease)   . Hyperlipidemia   . Hypertension   . Gout   . Asthma   . Edema     Past Surgical History  Procedure Laterality Date  . Vaginal hysterectomy    . Breast lumpectomy Right     removed a cancerous lump    History reviewed. No pertinent family history.  History   Social History  . Marital Status: Widowed    Spouse Name: N/A  . Number of Children: N/A  . Years of Education: N/A  Occupational History  . Not on file.   Social History Main Topics  . Smoking status: Former Research scientist (life sciences)  . Smokeless tobacco: Not on file  . Alcohol Use: No  . Drug Use: No  . Sexual Activity: Not Currently   Other Topics Concern  . Not on file   Social History Narrative  . No narrative on file    Allergies   Allergen Reactions  . Sulfa Antibiotics      Review of Systems  Constitutional: Positive for fatigue. Negative for fever, chills, weight loss and malaise/fatigue.  HENT: Negative for ear discharge, ear pain, hoarse voice and sore throat.   Eyes: Negative for blurred vision.  Respiratory: Negative for cough, sputum production, choking, shortness of breath and wheezing.   Cardiovascular: Negative for chest pain, palpitations, orthopnea, leg swelling and PND.  Gastrointestinal: Negative for heartburn, dysphagia, nausea, abdominal pain, diarrhea, constipation, blood in stool and melena.  Genitourinary: Negative for dysuria, urgency, frequency and hematuria.  Musculoskeletal: Negative for myalgias, back pain, joint pain and neck pain.  Skin: Negative for rash.  Neurological: Negative for dizziness, tingling, sensory change, focal weakness and headaches.  Endo/Heme/Allergies: Negative for environmental allergies and polydipsia. Does not bruise/bleed easily.  Psychiatric/Behavioral: Positive for dysphoric mood. Negative for depression, suicidal ideas and hallucinations. The patient is not nervous/anxious and does not have insomnia.      Objective  Filed Vitals:   10/25/14 1455  BP: 120/80  Pulse: 72  Height: 5\' 3"  (1.6 m)  Weight: 228 lb (103.42 kg)    Physical Exam  Constitutional: She is well-developed, well-nourished, and in no distress. No distress.  HENT:  Head: Normocephalic and atraumatic.  Right Ear: External ear normal.  Left Ear: External ear normal.  Nose: Nose normal.  Mouth/Throat: Oropharynx is clear and moist.  Eyes: Conjunctivae and EOM are normal. Pupils are equal, round, and reactive to light. Right eye exhibits no discharge. Left eye exhibits no discharge.  Neck: Normal range of motion. Neck supple. No JVD present. No thyromegaly present.  Cardiovascular: Normal rate, regular rhythm, normal heart sounds and intact distal pulses.  Exam reveals no gallop and no  friction rub.   No murmur heard. Pulmonary/Chest: Effort normal and breath sounds normal.  Abdominal: Soft. Bowel sounds are normal. She exhibits no mass. There is no tenderness. There is no guarding.  Musculoskeletal: Normal range of motion. She exhibits no edema.  Lymphadenopathy:    She has no cervical adenopathy.  Neurological: She is alert. She has normal reflexes.  Skin: Skin is warm and dry. She is not diaphoretic.  Psychiatric: Mood and affect normal.  Nursing note and vitals reviewed.     Assessment & Plan  Problem List Items Addressed This Visit      Respiratory   Airway hyperreactivity   Relevant Medications   Fluticasone-Salmeterol (ADVAIR DISKUS) 100-50 MCG/DOSE AEPB     Digestive   Acid reflux   Relevant Medications   omeprazole (PRILOSEC) 20 MG capsule     Other   Familial multiple lipoprotein-type hyperlipidemia   Relevant Medications   aspirin 81 MG tablet   furosemide (LASIX) 40 MG tablet   losartan (COZAAR) 100 MG tablet   lovastatin (MEVACOR) 40 MG tablet   niacin (NIASPAN) 1000 MG CR tablet   Other Relevant Orders   Lipid Profile   Clinical depression   Relevant Medications   sertraline (ZOLOFT) 50 MG tablet    Other Visit Diagnoses    Essential hypertension    -  Primary    Relevant Medications    aspirin 81 MG tablet    furosemide (LASIX) 40 MG tablet    losartan (COZAAR) 100 MG tablet    lovastatin (MEVACOR) 40 MG tablet    niacin (NIASPAN) 1000 MG CR tablet    Other Relevant Orders    Renal Function Panel    Hyperuricemia        Relevant Medications    allopurinol (ZYLOPRIM) 100 MG tablet    Allergic rhinitis due to pollen        Edema        Relevant Medications    furosemide (LASIX) 40 MG tablet    loratadine (CLARITIN) 10 MG tablet    Breast cancer, right        Relevant Medications    aspirin 81 MG tablet    allopurinol (ZYLOPRIM) 100 MG tablet    furosemide (LASIX) 40 MG tablet    loratadine (CLARITIN) 10 MG tablet     lovastatin (MEVACOR) 40 MG tablet    niacin (NIASPAN) 1000 MG CR tablet    omeprazole (PRILOSEC) 20 MG capsule    sertraline (ZOLOFT) 50 MG tablet    Osteoporosis screening        Relevant Medications    allopurinol (ZYLOPRIM) 100 MG tablet    furosemide (LASIX) 40 MG tablet    loratadine (CLARITIN) 10 MG tablet    lovastatin (MEVACOR) 40 MG tablet    niacin (NIASPAN) 1000 MG CR tablet    omeprazole (PRILOSEC) 20 MG capsule    sertraline (ZOLOFT) 50 MG tablet         Dr. Deanna Jones [facility]  10/25/2014

## 2014-10-25 NOTE — Progress Notes (Signed)
Patient states that overall she has been doing good. She states that she had a mammogram in December. She denies any new problems.

## 2014-10-26 LAB — RENAL FUNCTION PANEL
Albumin: 4.3 g/dL (ref 3.5–4.8)
BUN/Creatinine Ratio: 13 (ref 11–26)
BUN: 9 mg/dL (ref 8–27)
CALCIUM: 9.5 mg/dL (ref 8.7–10.3)
CO2: 30 mmol/L — ABNORMAL HIGH (ref 18–29)
Chloride: 96 mmol/L — ABNORMAL LOW (ref 97–108)
Creatinine, Ser: 0.68 mg/dL (ref 0.57–1.00)
GFR calc non Af Amer: 83 mL/min/{1.73_m2} (ref >59)
GFR, EST AFRICAN AMERICAN: 96 mL/min/{1.73_m2} (ref >59)
Glucose: 110 mg/dL — ABNORMAL HIGH (ref 65–99)
POTASSIUM: 4.6 mmol/L (ref 3.5–5.2)
Phosphorus: 3.8 mg/dL (ref 2.5–4.5)
SODIUM: 141 mmol/L (ref 134–144)

## 2014-10-26 LAB — LIPID PANEL
CHOLESTEROL TOTAL: 218 mg/dL — AB (ref 100–199)
Chol/HDL Ratio: 4.4 ratio units (ref 0.0–4.4)
HDL: 49 mg/dL (ref >39)
LDL Calculated: 130 mg/dL — ABNORMAL HIGH (ref 0–99)
TRIGLYCERIDES: 194 mg/dL — AB (ref 0–149)
VLDL CHOLESTEROL CAL: 39 mg/dL (ref 5–40)

## 2014-11-04 NOTE — Progress Notes (Signed)
[place]  [phone] [phone]     ID: Kirsten Snyder OB: 30-Aug-1933  MR#: [id number]  KPT#:[id number]  Patient Care Team: [name], MD as PCP - General (Family Medicine)  CHIEF COMPLAINT/DIAGNOSIS:  Pathologic stage IA (T1 B. N0 M0) infiltrating ductal carcinoma of right breast ER/PR positive HER-2/neu negative 06/16/10: Oncotype Dx: recurrence score = 14 Arimidex 1 mg po daily 07/02/10.   HISTORY OF PRESENT ILLNESS:  Patient returns for continued oncology followup, she was last seen one year ago. States that she is doing steady and denies any new complaints. Appetite is good, denies unintentional weight loss. She is taking anastrozole and denies any new side effects from this, states that chronic arthritis symptoms mostly in the knees are unchanged, no new bone pains. Denies feeling any new breast masses on self exam.  No new cough, chest pain or hemoptysis.  No new mood disturbances.  REVIEW OF SYSTEMS:   ROS As in HPI above. In addition, no fever, chills or sweats. No new headaches or focal weakness.  No new sore throat, cough, shortness of breath, sputum, hemoptysis or chest pain. No dizziness or palpitation. No abdominal pain, constipation, diarrhea, dysuria or hematuria. No new skin rash or bleeding symptoms. No new paresthesias in extremities. PS ECOG 0.    PAST MEDICAL HISTORY: Reviewed. Past Medical History  Diagnosis Date  . Allergy   . Depression   . GERD (gastroesophageal reflux disease)   . Hyperlipidemia   . Hypertension   . Gout   . Asthma   . Edema           1. lung nodule noted in January 2000.  Follows with Dr. Delilah Shan at [facility].  2. Hypertension  3. Asthma  4. History of dyspepsia  5. Osteoarthritis  6. Gout  7. Obesity  8. Status post total abdominal hysterectomy in 1970 for fibroids  9. Status post breast nodule removal which showed no cancer  10. On 1-17 -12 right breast lumpectomy with sentinel lymph node  dissection Dr. Lavone Neri   PAST SURGICAL HISTORY: Reviewed. Past Surgical History  Procedure Laterality Date  . Vaginal hysterectomy    . Breast lumpectomy Right     removed a cancerous lump    FAMILY HISTORY: Reviewed. Noncontributory.  SOCIAL HISTORY: Reviewed. History  Substance Use Topics  . Smoking status: Former Research scientist (life sciences)  . Smokeless tobacco: Not on file  . Alcohol Use: No    Allergies  Allergen Reactions  . Sulfa Antibiotics     Current Outpatient Prescriptions  Medication Sig Dispense Refill  . anastrozole (ARIMIDEX) 1 MG tablet Take 1 mg by mouth daily.    . polyethylene glycol (MIRALAX / GLYCOLAX) packet Take 17 g by mouth daily.    Marland Kitchen allopurinol (ZYLOPRIM) 100 MG tablet Take 1 tablet (100 mg total) by mouth daily. 90 tablet 1  . aspirin 81 MG tablet Take 81 mg by mouth daily.    . Fluticasone-Salmeterol (ADVAIR DISKUS) 100-50 MCG/DOSE AEPB Inhale 1 puff into the lungs 2 (two) times daily. 3 each 1  . furosemide (LASIX) 40 MG tablet Take 1 tablet (40 mg total) by mouth daily. Taking 1/2 tablet daily 90 tablet 1  . loratadine (CLARITIN) 10 MG tablet Take 1 tablet (10 mg total) by mouth daily. 98 tablet 1  . losartan (COZAAR) 100 MG tablet Take 1 tablet (100 mg total) by mouth daily. 90 tablet 1  . lovastatin (MEVACOR) 40 MG tablet Take 1 tablet (40 mg  total) by mouth at bedtime. 90 tablet 1  . niacin (NIASPAN) 1000 MG CR tablet Take 1 tablet (1,000 mg total) by mouth at bedtime. 90 tablet 1  . nystatin cream (MYCOSTATIN) Apply 1 application topically as needed.    Marland Kitchen omeprazole (PRILOSEC) 20 MG capsule Take 1 capsule (20 mg total) by mouth daily. 90 capsule 1  . sertraline (ZOLOFT) 50 MG tablet Take 1 tablet (50 mg total) by mouth daily. 90 tablet 1   No current facility-administered medications for this visit.    PHYSICAL EXAM: Filed Vitals:   10/25/14 1050  BP: 143/71  Pulse: 81  Temp: 98.3 F (36.8 C)  Resp: 18     Body mass index is 40.31 kg/(m^2).    ECOG  FS:0 - Asymptomatic  GENERAL: Patient is alert and oriented and in no acute distress. There is no icterus. HEENT: EOMs intact. Oral exam negative for thrush or lesions. No cervical lymphadenopathy. CVS: S1S2, regular LUNGS: Bilaterally clear to auscultation, no rhonchi. ABDOMEN: Soft, nontender. No hepatosplenomegaly clinically.  EXTREMITIES: No pedal edema. BREASTS: No dominant masses palpable in either breast.  No axillary adenopathy in either side.  Exam performed in presence of a nurse.   LAB RESULTS:    Component Value Date/Time   NA 141 10/25/2014 1541   NA 137 04/08/2012 1311   K 4.6 10/25/2014 1541   K 3.6 04/08/2012 1311   CL 96* 10/25/2014 1541   CL 102 04/08/2012 1311   CO2 30* 10/25/2014 1541   CO2 28 04/08/2012 1311   GLUCOSE 110* 10/25/2014 1541   GLUCOSE 104* 04/08/2012 1311   BUN 9 10/25/2014 1541   BUN 10 04/08/2012 1311   CREATININE 0.68 10/25/2014 1541   CREATININE 0.80 10/19/2013 1023   CALCIUM 9.5 10/25/2014 1541   CALCIUM 8.7 04/08/2012 1311   PROT 7.1 10/25/2014 1019   PROT 7.2 10/19/2013 1023   ALBUMIN 4.0 10/25/2014 1019   ALBUMIN 3.5 10/19/2013 1023   AST 19 10/25/2014 1019   AST 18 10/19/2013 1023   ALT 13* 10/25/2014 1019   ALT 20 10/19/2013 1023   ALKPHOS 90 10/25/2014 1019   ALKPHOS 97 10/19/2013 1023   BILITOT 0.7 10/25/2014 1019   GFRNONAA 83 10/25/2014 1541   GFRNONAA >60 10/19/2013 1023   GFRAA 96 10/25/2014 1541   GFRAA >60 10/19/2013 1023   Lab Results  Component Value Date   WBC 5.7 10/25/2014   NEUTROABS 3.1 10/25/2014   HGB 12.5 10/25/2014   HCT 37.2 10/25/2014   MCV 92.9 10/25/2014   PLT 286 10/25/2014     STUDIES: Apr 17, 2014 - Mammogram. BI-RADS CATEGORY  2: Benign Finding(s)  June 2014 - DEXA scan unremarkable. T-score is -0.6.   ASSESSMENT / PLAN:   1. Pathologic stage IA (T1 B. N0 M0) infiltrating ductal carcinoma of right breast ER/PR positive HER-2/neu negative  status post wide local excision and sentinel  node biopsy in 81 year old female -   Reviewed labs from today and d/w patient. She is doing steady with no clinical evidence to suggest recurrent or metastatic breast cancer.  Last mammogram in December was also negative, BIRADS-2.  She is tolerating anastrozole fairly well, have discussed further options including exemestane or tamoxifen given her joint symptoms but patient wants to continue on anastrozole and will notify us if they get worse.  Will schedule her next annual bilateral mammogram in December 2014. Otherwise will continue surveillance, see her back in one year with repeat labs. Will get next  Bilateral mammogram on 04/19/15.  2. Osteoporosis surveillance - DEXA scan June 0214 was unremarkable with T-score is -0.6. She was reminded to continue to take calcium plus vitamin D twice daily. Get repeat DEXA scan on 11/01/14. 3. In between visits patient was to call in case of any new breast masses felt on self-exam, side effects from anastrozole or other new symptoms. She is agreeable to this plan.   Leia Alf, MD   11/04/2014 10:54 AM

## 2014-11-12 ENCOUNTER — Other Ambulatory Visit: Payer: Self-pay | Admitting: Internal Medicine

## 2014-11-15 ENCOUNTER — Ambulatory Visit
Admission: RE | Admit: 2014-11-15 | Discharge: 2014-11-15 | Disposition: A | Discharge status: Home / Self Care | Payer: Medicare Other | Source: Ambulatory Visit | Attending: Internal Medicine | Admitting: Internal Medicine

## 2014-11-15 DIAGNOSIS — M8580 Other specified disorders of bone density and structure, unspecified site: Secondary | ICD-10-CM | POA: Insufficient documentation

## 2014-11-15 DIAGNOSIS — M8588 Other specified disorders of bone density and structure, other site: Secondary | ICD-10-CM | POA: Diagnosis not present

## 2014-11-15 DIAGNOSIS — C50911 Malignant neoplasm of unspecified site of right female breast: Secondary | ICD-10-CM | POA: Diagnosis not present

## 2014-11-15 DIAGNOSIS — Z13820 Encounter for screening for osteoporosis: Secondary | ICD-10-CM

## 2014-11-15 DIAGNOSIS — Z780 Asymptomatic menopausal state: Secondary | ICD-10-CM | POA: Diagnosis not present

## 2014-11-30 DIAGNOSIS — D2262 Melanocytic nevi of left upper limb, including shoulder: Secondary | ICD-10-CM | POA: Diagnosis not present

## 2014-11-30 DIAGNOSIS — D2261 Melanocytic nevi of right upper limb, including shoulder: Secondary | ICD-10-CM | POA: Diagnosis not present

## 2014-11-30 DIAGNOSIS — Z08 Encounter for follow-up examination after completed treatment for malignant neoplasm: Secondary | ICD-10-CM | POA: Diagnosis not present

## 2014-11-30 DIAGNOSIS — Z7189 Other specified counseling: Secondary | ICD-10-CM | POA: Diagnosis not present

## 2014-11-30 DIAGNOSIS — B078 Other viral warts: Secondary | ICD-10-CM | POA: Diagnosis not present

## 2014-11-30 DIAGNOSIS — L821 Other seborrheic keratosis: Secondary | ICD-10-CM | POA: Diagnosis not present

## 2014-11-30 DIAGNOSIS — D225 Melanocytic nevi of trunk: Secondary | ICD-10-CM | POA: Diagnosis not present

## 2014-11-30 DIAGNOSIS — D485 Neoplasm of uncertain behavior of skin: Secondary | ICD-10-CM | POA: Diagnosis not present

## 2014-11-30 DIAGNOSIS — Z85828 Personal history of other malignant neoplasm of skin: Secondary | ICD-10-CM | POA: Diagnosis not present

## 2014-11-30 DIAGNOSIS — L814 Other melanin hyperpigmentation: Secondary | ICD-10-CM | POA: Diagnosis not present

## 2015-02-26 DIAGNOSIS — Z23 Encounter for immunization: Secondary | ICD-10-CM | POA: Diagnosis not present

## 2015-03-29 ENCOUNTER — Encounter: Payer: Self-pay | Admitting: Family Medicine

## 2015-03-29 ENCOUNTER — Ambulatory Visit (INDEPENDENT_AMBULATORY_CARE_PROVIDER_SITE_OTHER): Payer: Medicare Other | Admitting: Family Medicine

## 2015-03-29 VITALS — BP 112/70 | HR 72 | Temp 98.3°F | Ht 63.0 in | Wt 233.0 lb

## 2015-03-29 DIAGNOSIS — J219 Acute bronchiolitis, unspecified: Secondary | ICD-10-CM | POA: Diagnosis not present

## 2015-03-29 DIAGNOSIS — J0100 Acute maxillary sinusitis, unspecified: Secondary | ICD-10-CM

## 2015-03-29 MED ORDER — GUAIFENESIN-CODEINE 100-10 MG/5ML PO SOLN: 5.0000 mL | Freq: Three times a day (TID) | ORAL | Status: DC | PRN

## 2015-03-29 MED ORDER — AMOXICILLIN 500 MG PO CAPS: 500.0000 mg | ORAL_CAPSULE | Freq: Three times a day (TID) | ORAL | Status: DC

## 2015-03-29 NOTE — Progress Notes (Signed)
Name: Kirsten Snyder   MRN: [id number]    DOB: [birth year]   Date:03/29/2015       Progress Note  Subjective  Chief Complaint  Chief Complaint  Patient presents with  . Sinusitis    cough and congestion- no production    Sinusitis This is a new problem. The current episode started 1 to 4 weeks ago. The problem has been gradually worsening since onset. There has been no fever. She is experiencing no pain. Associated symptoms include chills, congestion, coughing, ear pain, shortness of breath, sinus pressure and a sore throat. Pertinent negatives include no diaphoresis, headaches, hoarse voice, neck pain or sneezing. Past treatments include acetaminophen and oral decongestants. The treatment provided no relief.    No problem-specific assessment & plan notes found for this encounter.   Past Medical History  Diagnosis Date  . Allergy   . Depression   . GERD (gastroesophageal reflux disease)   . Hyperlipidemia   . Hypertension   . Gout   . Asthma   . Edema     Past Surgical History  Procedure Laterality Date  . Vaginal hysterectomy    . Breast lumpectomy Right     removed a cancerous lump    History reviewed. No pertinent family history.  Social History   Social History  . Marital Status: Widowed    Spouse Name: N/A  . Number of Children: N/A  . Years of Education: N/A   Occupational History  . Not on file.   Social History Main Topics  . Smoking status: Former Research scientist (life sciences)  . Smokeless tobacco: Not on file  . Alcohol Use: No  . Drug Use: No  . Sexual Activity: Not Currently   Other Topics Concern  . Not on file   Social History Narrative    Allergies  Allergen Reactions  . Sulfa Antibiotics      Review of Systems  Constitutional: Positive for chills. Negative for fever, weight loss, malaise/fatigue and diaphoresis.  HENT: Positive for congestion, ear pain, sinus pressure and sore throat. Negative for ear discharge, hoarse voice and sneezing.   Eyes:  Negative for blurred vision.  Respiratory: Positive for cough and shortness of breath. Negative for sputum production and wheezing.   Cardiovascular: Negative for chest pain, palpitations and leg swelling.  Gastrointestinal: Negative for heartburn, nausea, abdominal pain, diarrhea, constipation, blood in stool and melena.  Genitourinary: Negative for dysuria, urgency, frequency and hematuria.  Musculoskeletal: Negative for myalgias, back pain, joint pain and neck pain.  Skin: Negative for rash.  Neurological: Negative for dizziness, tingling, sensory change, focal weakness and headaches.  Endo/Heme/Allergies: Negative for environmental allergies and polydipsia. Does not bruise/bleed easily.  Psychiatric/Behavioral: Negative for depression and suicidal ideas. The patient is not nervous/anxious and does not have insomnia.      Objective  Filed Vitals:   03/29/15 1104  BP: 112/70  Pulse: 72  Temp: 98.3 F (36.8 C)  TempSrc: Oral  Height: 5\' 3"  (1.6 m)  Weight: 233 lb (105.688 kg)    Physical Exam  Constitutional: She is well-developed, well-nourished, and in no distress. No distress.  HENT:  Head: Normocephalic and atraumatic.  Right Ear: External ear normal.  Left Ear: External ear normal.  Nose: Nose normal.  Mouth/Throat: Oropharynx is clear and moist.  Eyes: Conjunctivae and EOM are normal. Pupils are equal, round, and reactive to light. Right eye exhibits no discharge. Left eye exhibits no discharge.  Neck: Normal range of motion. Neck supple. No JVD present. No  thyromegaly present.  Cardiovascular: Normal rate, regular rhythm, normal heart sounds and intact distal pulses.  Exam reveals no gallop and no friction rub.   No murmur heard. Pulmonary/Chest: Effort normal and breath sounds normal.  Abdominal: Soft. Bowel sounds are normal. She exhibits no mass. There is no tenderness. There is no guarding.  Musculoskeletal: Normal range of motion. She exhibits no edema.   Lymphadenopathy:    She has no cervical adenopathy.  Neurological: She is alert. She has normal reflexes.  Skin: Skin is warm and dry. She is not diaphoretic.  Psychiatric: Mood and affect normal.      Assessment & Plan  Problem List Items Addressed This Visit    None    Visit Diagnoses    Acute maxillary sinusitis, recurrence not specified    -  Primary    Relevant Medications    amoxicillin (AMOXIL) 500 MG capsule    guaiFENesin-codeine 100-10 MG/5ML syrup    Bronchiolitis        Relevant Medications    guaiFENesin-codeine 100-10 MG/5ML syrup         Dr. Kwesi Sangha [facility]  03/29/2015

## 2015-04-05 ENCOUNTER — Other Ambulatory Visit: Payer: Self-pay

## 2015-04-05 DIAGNOSIS — R059 Cough, unspecified: Secondary | ICD-10-CM

## 2015-04-05 DIAGNOSIS — R05 Cough: Secondary | ICD-10-CM

## 2015-04-05 MED ORDER — BENZONATATE 100 MG PO CAPS: 100.0000 mg | ORAL_CAPSULE | Freq: Three times a day (TID) | ORAL | Status: DC | PRN

## 2015-04-19 ENCOUNTER — Other Ambulatory Visit: Payer: Self-pay | Admitting: Internal Medicine

## 2015-04-19 ENCOUNTER — Ambulatory Visit
Admission: RE | Admit: 2015-04-19 | Discharge: 2015-04-19 | Disposition: A | Discharge status: Home / Self Care | Payer: Medicare Other | Source: Ambulatory Visit | Attending: Internal Medicine | Admitting: Internal Medicine

## 2015-04-19 DIAGNOSIS — Z13820 Encounter for screening for osteoporosis: Secondary | ICD-10-CM

## 2015-04-19 DIAGNOSIS — R928 Other abnormal and inconclusive findings on diagnostic imaging of breast: Secondary | ICD-10-CM | POA: Diagnosis not present

## 2015-04-19 DIAGNOSIS — C50911 Malignant neoplasm of unspecified site of right female breast: Secondary | ICD-10-CM

## 2015-04-19 HISTORY — DX: Malignant (primary) neoplasm, unspecified: C80.1

## 2015-04-23 ENCOUNTER — Other Ambulatory Visit: Payer: Self-pay

## 2015-05-10 ENCOUNTER — Other Ambulatory Visit: Payer: Self-pay

## 2015-05-14 ENCOUNTER — Other Ambulatory Visit: Payer: Self-pay

## 2015-05-14 DIAGNOSIS — H3589 Other specified retinal disorders: Secondary | ICD-10-CM | POA: Diagnosis not present

## 2015-05-17 ENCOUNTER — Ambulatory Visit: Payer: Medicare Other | Admitting: Family Medicine

## 2015-05-17 ENCOUNTER — Telehealth: Payer: Self-pay | Admitting: *Deleted

## 2015-05-17 NOTE — Telephone Encounter (Signed)
rcvd fax for refill of anastrozole. Per sch. Pt not coming until July 2017.  Per Dr. Grayland Ormond on call he says med can be one time 90 days with no refills and get in touch with pt to move appt up and get acquainted with new provider since pandit left because typically pt on AI needs to be monitored every 6 months.  I called pt and she is ok with coming in February and seeing anybody as long as they coem to [place] office.  She did mention that she has cyst and it gets red and some bruising.  She did tell the radiologist that did her mammogram and it is notated in her record of Mammogram that it is oily cyst.  i will enter order for [place] appt and let her know when it is sch.

## 2015-05-20 ENCOUNTER — Other Ambulatory Visit: Payer: Self-pay | Admitting: *Deleted

## 2015-05-20 ENCOUNTER — Telehealth: Payer: Self-pay | Admitting: *Deleted

## 2015-05-20 ENCOUNTER — Encounter: Payer: Self-pay | Admitting: Family Medicine

## 2015-05-20 ENCOUNTER — Ambulatory Visit (INDEPENDENT_AMBULATORY_CARE_PROVIDER_SITE_OTHER): Payer: Medicare Other | Admitting: Family Medicine

## 2015-05-20 VITALS — BP 120/80 | HR 80 | Ht 63.0 in | Wt 234.0 lb

## 2015-05-20 DIAGNOSIS — Z23 Encounter for immunization: Secondary | ICD-10-CM | POA: Diagnosis not present

## 2015-05-20 DIAGNOSIS — K219 Gastro-esophageal reflux disease without esophagitis: Secondary | ICD-10-CM | POA: Diagnosis not present

## 2015-05-20 DIAGNOSIS — F329 Major depressive disorder, single episode, unspecified: Secondary | ICD-10-CM

## 2015-05-20 DIAGNOSIS — J301 Allergic rhinitis due to pollen: Secondary | ICD-10-CM

## 2015-05-20 DIAGNOSIS — E790 Hyperuricemia without signs of inflammatory arthritis and tophaceous disease: Secondary | ICD-10-CM | POA: Diagnosis not present

## 2015-05-20 DIAGNOSIS — E784 Other hyperlipidemia: Secondary | ICD-10-CM

## 2015-05-20 DIAGNOSIS — J0191 Acute recurrent sinusitis, unspecified: Secondary | ICD-10-CM | POA: Diagnosis not present

## 2015-05-20 DIAGNOSIS — C50911 Malignant neoplasm of unspecified site of right female breast: Secondary | ICD-10-CM

## 2015-05-20 DIAGNOSIS — Z13820 Encounter for screening for osteoporosis: Secondary | ICD-10-CM | POA: Diagnosis not present

## 2015-05-20 DIAGNOSIS — J4520 Mild intermittent asthma, uncomplicated: Secondary | ICD-10-CM | POA: Diagnosis not present

## 2015-05-20 DIAGNOSIS — I10 Essential (primary) hypertension: Secondary | ICD-10-CM

## 2015-05-20 DIAGNOSIS — E7849 Other hyperlipidemia: Secondary | ICD-10-CM

## 2015-05-20 DIAGNOSIS — B372 Candidiasis of skin and nail: Secondary | ICD-10-CM | POA: Diagnosis not present

## 2015-05-20 DIAGNOSIS — F32A Depression, unspecified: Secondary | ICD-10-CM

## 2015-05-20 MED ORDER — ALLOPURINOL 100 MG PO TABS: 100.0000 mg | ORAL_TABLET | Freq: Every day | ORAL | Status: DC

## 2015-05-20 MED ORDER — LORATADINE 10 MG PO TABS: 10.0000 mg | ORAL_TABLET | Freq: Every day | ORAL | Status: DC

## 2015-05-20 MED ORDER — OMEPRAZOLE 20 MG PO CPDR: 20.0000 mg | DELAYED_RELEASE_CAPSULE | Freq: Every day | ORAL | Status: DC

## 2015-05-20 MED ORDER — LOSARTAN POTASSIUM 100 MG PO TABS: 100.0000 mg | ORAL_TABLET | Freq: Every day | ORAL | Status: DC

## 2015-05-20 MED ORDER — FLUTICASONE-SALMETEROL 100-50 MCG/DOSE IN AEPB: 1.0000 | INHALATION_SPRAY | Freq: Two times a day (BID) | RESPIRATORY_TRACT | Status: DC

## 2015-05-20 MED ORDER — AMOXICILLIN 500 MG PO CAPS: 500.0000 mg | ORAL_CAPSULE | Freq: Three times a day (TID) | ORAL | Status: DC

## 2015-05-20 MED ORDER — FUROSEMIDE 40 MG PO TABS: 40.0000 mg | ORAL_TABLET | Freq: Every day | ORAL | Status: DC

## 2015-05-20 MED ORDER — NIACIN ER (ANTIHYPERLIPIDEMIC) 1000 MG PO TBCR: 1000.0000 mg | EXTENDED_RELEASE_TABLET | Freq: Every day | ORAL | Status: DC

## 2015-05-20 MED ORDER — LOVASTATIN 40 MG PO TABS: 40.0000 mg | ORAL_TABLET | Freq: Every day | ORAL | Status: DC

## 2015-05-20 MED ORDER — SERTRALINE HCL 50 MG PO TABS: 50.0000 mg | ORAL_TABLET | Freq: Every day | ORAL | Status: DC

## 2015-05-20 MED ORDER — NYSTATIN 100000 UNIT/GM EX CREA: 1.0000 "application " | TOPICAL_CREAM | CUTANEOUS | Status: DC | PRN

## 2015-05-20 MED ORDER — BENZONATATE 100 MG PO CAPS: 100.0000 mg | ORAL_CAPSULE | Freq: Two times a day (BID) | ORAL | Status: DC | PRN

## 2015-05-20 NOTE — Progress Notes (Signed)
Name: Kirsten Snyder   MRN: [id number]    DOB: 1933/08/19   Date:05/20/2015       Progress Note  Subjective  Chief Complaint  Chief Complaint  Patient presents with  . Gastroesophageal Reflux  . Hypertension  . Hyperlipidemia  . Asthma  . Sinusitis    needs refill on Amox and Tessalon Perles  . Allergic Rhinitis   . Depression  . Gout    Gastroesophageal Reflux She reports no abdominal pain, no chest pain, no coughing, no heartburn, no nausea, no sore throat or no wheezing. This is a chronic problem. The problem occurs occasionally. The problem has been gradually improving. The symptoms are aggravated by certain foods. Pertinent negatives include no anemia, fatigue, melena, muscle weakness, orthopnea or weight loss. The treatment provided mild relief.  Hypertension This is a chronic problem. The current episode started more than 1 year ago. The problem has been waxing and waning since onset. The problem is controlled. Pertinent negatives include no blurred vision, chest pain, headaches, malaise/fatigue, neck pain, orthopnea, palpitations or shortness of breath. There are no associated agents to hypertension. Risk factors for coronary artery disease include dyslipidemia and obesity. Past treatments include angiotensin blockers and diuretics. The current treatment provides moderate improvement. There are no compliance problems.  There is no history of angina, kidney disease, CAD/MI, CVA, heart failure, left ventricular hypertrophy, PVD, renovascular disease or retinopathy. There is no history of chronic renal disease or a hypertension causing med.  Hyperlipidemia This is a chronic problem. The current episode started more than 1 year ago. The problem is controlled. She has no history of chronic renal disease. There are no known factors aggravating her hyperlipidemia. Pertinent negatives include no chest pain, focal weakness, myalgias or shortness of breath. Current antihyperlipidemic treatment  includes statins. The current treatment provides mild improvement of lipids. There are no compliance problems.   Asthma There is no chest tightness, cough, frequent throat clearing, shortness of breath, sputum production or wheezing. This is a chronic problem. The current episode started more than 1 year ago. The problem has been waxing and waning. Pertinent negatives include no chest pain, ear congestion, ear pain, fever, headaches, heartburn, malaise/fatigue, myalgias, orthopnea, postnasal drip, rhinorrhea, sore throat or weight loss. Her symptoms are aggravated by change in weather. Her symptoms are alleviated by steroid inhaler and beta-agonist. She reports moderate improvement on treatment. Her past medical history is significant for asthma.  Sinusitis This is a recurrent problem. The problem has been waxing and waning since onset. Pertinent negatives include no chills, coughing, ear pain, headaches, neck pain, shortness of breath or sore throat. Past treatments include oral decongestants (nasal steroid). The treatment provided mild relief.  Depression        This is a recurrent problem.  The current episode started more than 1 year ago.   The onset quality is gradual.   The problem has been waxing and waning since onset.  Associated symptoms include no fatigue, no helplessness, no hopelessness, does not have insomnia, not irritable, no decreased interest, no myalgias, no headaches, not sad and no suicidal ideas.  Past treatments include SSRIs - Selective serotonin reuptake inhibitors.   No problem-specific assessment & plan notes found for this encounter.   Past Medical History  Diagnosis Date  . Allergy   . Depression   . GERD (gastroesophageal reflux disease)   . Hyperlipidemia   . Hypertension   . Gout   . Asthma   . Edema   .  Cancer [facility])     right breast cancer 2012    Past Surgical History  Procedure Laterality Date  . Vaginal hysterectomy    . Breast lumpectomy Right      removed a cancerous lump  . Breast biopsy Bilateral     negative  . Breast excisional biopsy Right     right positive 04/2010    Family History  Problem Relation Age of Onset  . Breast cancer Neg Hx     Social History   Social History  . Marital Status: Widowed    Spouse Name: N/A  . Number of Children: N/A  . Years of Education: N/A   Occupational History  . Not on file.   Social History Main Topics  . Smoking status: Former Research scientist (life sciences)  . Smokeless tobacco: Not on file  . Alcohol Use: No  . Drug Use: No  . Sexual Activity: Not Currently   Other Topics Concern  . Not on file   Social History Narrative    Allergies  Allergen Reactions  . Sulfa Antibiotics      Review of Systems  Constitutional: Negative for fever, chills, weight loss, malaise/fatigue and fatigue.  HENT: Negative for ear discharge, ear pain, postnasal drip, rhinorrhea and sore throat.   Eyes: Negative for blurred vision.  Respiratory: Negative for cough, sputum production, shortness of breath and wheezing.   Cardiovascular: Negative for chest pain, palpitations, orthopnea and leg swelling.  Gastrointestinal: Negative for heartburn, nausea, abdominal pain, diarrhea, constipation, blood in stool and melena.  Genitourinary: Negative for dysuria, urgency, frequency and hematuria.  Musculoskeletal: Negative for myalgias, back pain, joint pain, muscle weakness and neck pain.  Skin: Negative for rash.  Neurological: Negative for dizziness, tingling, sensory change, focal weakness and headaches.  Endo/Heme/Allergies: Negative for environmental allergies and polydipsia. Does not bruise/bleed easily.  Psychiatric/Behavioral: Positive for depression. Negative for suicidal ideas. The patient is not nervous/anxious and does not have insomnia.      Objective  Filed Vitals:   05/20/15 1012  BP: 120/80  Pulse: 80  Height: 5\' 3"  (1.6 m)  Weight: 234 lb (106.142 kg)    Physical Exam  Constitutional: She is  well-developed, well-nourished, and in no distress. She is not irritable. No distress.  HENT:  Head: Normocephalic and atraumatic.  Right Ear: Tympanic membrane, external ear and ear canal normal.  Left Ear: Tympanic membrane, external ear and ear canal normal.  Nose: Nose normal.  Mouth/Throat: Oropharynx is clear and moist.  Eyes: Conjunctivae and EOM are normal. Pupils are equal, round, and reactive to light. Right eye exhibits no discharge. Left eye exhibits no discharge.  Neck: Normal range of motion. Neck supple. No JVD present. No thyromegaly present.  Cardiovascular: Normal rate, regular rhythm, normal heart sounds and intact distal pulses.  Exam reveals no gallop and no friction rub.   No murmur heard. Pulmonary/Chest: Effort normal and breath sounds normal. She has no wheezes. She has no rales. She exhibits no tenderness.  Abdominal: Soft. Bowel sounds are normal. She exhibits no mass. There is no tenderness. There is no guarding.  Musculoskeletal: Normal range of motion. She exhibits no edema.  Lymphadenopathy:    She has no cervical adenopathy.  Neurological: She is alert. She has normal reflexes.  Skin: Skin is warm and dry. She is not diaphoretic.  Psychiatric: Mood and affect normal.  Nursing note and vitals reviewed.     Assessment & Plan  Problem List Items Addressed This Visit  Respiratory   Airway hyperreactivity   Relevant Medications   Fluticasone-Salmeterol (ADVAIR DISKUS) 100-50 MCG/DOSE AEPB     Digestive   Acid reflux   Relevant Medications   omeprazole (PRILOSEC) 20 MG capsule     Other   Familial multiple lipoprotein-type hyperlipidemia   Relevant Medications   niacin (NIASPAN) 1000 MG CR tablet   lovastatin (MEVACOR) 40 MG tablet   losartan (COZAAR) 100 MG tablet   furosemide (LASIX) 40 MG tablet   Other Relevant Orders   Lipid Profile   Clinical depression   Relevant Medications   sertraline (ZOLOFT) 50 MG tablet    Other Visit  Diagnoses    Essential hypertension    -  Primary    Relevant Medications    niacin (NIASPAN) 1000 MG CR tablet    lovastatin (MEVACOR) 40 MG tablet    losartan (COZAAR) 100 MG tablet    furosemide (LASIX) 40 MG tablet    Other Relevant Orders    Renal Function Panel    Osteoporosis screening        Hyperuricemia        Relevant Medications    allopurinol (ZYLOPRIM) 100 MG tablet    Other Relevant Orders    Uric acid    Yeast infection of the skin        Relevant Medications    nystatin cream (MYCOSTATIN)    Allergic rhinitis due to pollen        Relevant Medications    loratadine (CLARITIN) 10 MG tablet    Fluticasone-Salmeterol (ADVAIR DISKUS) 100-50 MCG/DOSE AEPB    Acute recurrent sinusitis, unspecified location        Relevant Medications    loratadine (CLARITIN) 10 MG tablet    amoxicillin (AMOXIL) 500 MG capsule    benzonatate (TESSALON) 100 MG capsule    Need for Tdap vaccination        Relevant Orders    Tdap vaccine greater than or equal to 7yo IM (Completed)         Dr. [facility]  05/20/2015

## 2015-05-20 NOTE — Telephone Encounter (Signed)
Called pt to let her know that she has appt 2/24 at 10:30 for labs and 10:45 to see md in [place]. Dr. Grayland Ormond.  I left her a message with date and time and mailed appt card to her. Gave her my phone number if she has questions on her voicemail

## 2015-05-21 LAB — RENAL FUNCTION PANEL
ALBUMIN: 4.3 g/dL (ref 3.5–4.8)
BUN/Creatinine Ratio: 16 (ref 11–26)
BUN: 13 mg/dL (ref 8–27)
CALCIUM: 9.3 mg/dL (ref 8.7–10.3)
CO2: 29 mmol/L (ref 18–29)
CREATININE: 0.81 mg/dL (ref 0.57–1.00)
Chloride: 98 mmol/L (ref 96–106)
GFR calc Af Amer: 80 mL/min/{1.73_m2} (ref >59)
GFR calc non Af Amer: 69 mL/min/{1.73_m2} (ref >59)
Glucose: 107 mg/dL — ABNORMAL HIGH (ref 65–99)
Phosphorus: 4 mg/dL (ref 2.5–4.5)
Potassium: 4.4 mmol/L (ref 3.5–5.2)
Sodium: 143 mmol/L (ref 134–144)

## 2015-05-21 LAB — LIPID PANEL
CHOL/HDL RATIO: 4.1 ratio (ref 0.0–4.4)
Cholesterol, Total: 218 mg/dL — ABNORMAL HIGH (ref 100–199)
HDL: 53 mg/dL (ref >39)
LDL Calculated: 136 mg/dL — ABNORMAL HIGH (ref 0–99)
TRIGLYCERIDES: 144 mg/dL (ref 0–149)
VLDL CHOLESTEROL CAL: 29 mg/dL (ref 5–40)

## 2015-05-21 LAB — URIC ACID: Uric Acid: 4.1 mg/dL (ref 2.5–7.1)

## 2015-06-14 ENCOUNTER — Inpatient Hospital Stay: Payer: Medicare Other | Attending: Oncology

## 2015-06-14 ENCOUNTER — Other Ambulatory Visit: Payer: Self-pay | Admitting: *Deleted

## 2015-06-14 ENCOUNTER — Ambulatory Visit (HOSPITAL_BASED_OUTPATIENT_CLINIC_OR_DEPARTMENT_OTHER): Payer: Medicare Other | Admitting: Oncology

## 2015-06-14 VITALS — BP 135/54 | HR 83 | Temp 97.7°F | Resp 20 | Wt 234.3 lb

## 2015-06-14 DIAGNOSIS — K219 Gastro-esophageal reflux disease without esophagitis: Secondary | ICD-10-CM | POA: Diagnosis not present

## 2015-06-14 DIAGNOSIS — C50919 Malignant neoplasm of unspecified site of unspecified female breast: Secondary | ICD-10-CM

## 2015-06-14 DIAGNOSIS — Z79811 Long term (current) use of aromatase inhibitors: Secondary | ICD-10-CM

## 2015-06-14 DIAGNOSIS — E785 Hyperlipidemia, unspecified: Secondary | ICD-10-CM

## 2015-06-14 DIAGNOSIS — N63 Unspecified lump in breast: Secondary | ICD-10-CM | POA: Insufficient documentation

## 2015-06-14 DIAGNOSIS — Z79899 Other long term (current) drug therapy: Secondary | ICD-10-CM | POA: Insufficient documentation

## 2015-06-14 DIAGNOSIS — Z87891 Personal history of nicotine dependence: Secondary | ICD-10-CM | POA: Diagnosis not present

## 2015-06-14 DIAGNOSIS — Z170 Estrogen receptor positive status [ER+]: Secondary | ICD-10-CM

## 2015-06-14 DIAGNOSIS — I10 Essential (primary) hypertension: Secondary | ICD-10-CM | POA: Diagnosis not present

## 2015-06-14 DIAGNOSIS — C50911 Malignant neoplasm of unspecified site of right female breast: Secondary | ICD-10-CM

## 2015-06-14 DIAGNOSIS — M8580 Other specified disorders of bone density and structure, unspecified site: Secondary | ICD-10-CM | POA: Insufficient documentation

## 2015-06-14 LAB — CBC WITH DIFFERENTIAL/PLATELET
BASOS ABS: 0.1 10*3/uL (ref 0–0.1)
BASOS PCT: 1 %
Eosinophils Absolute: 0.4 10*3/uL (ref 0–0.7)
Eosinophils Relative: 6 %
HEMATOCRIT: 36 % (ref 35.0–47.0)
Hemoglobin: 12.2 g/dL (ref 12.0–16.0)
LYMPHS PCT: 34 %
Lymphs Abs: 2.4 10*3/uL (ref 1.0–3.6)
MCH: 31.7 pg (ref 26.0–34.0)
MCHC: 33.9 g/dL (ref 32.0–36.0)
MCV: 93.5 fL (ref 80.0–100.0)
MONO ABS: 0.4 10*3/uL (ref 0.2–0.9)
Monocytes Relative: 6 %
NEUTROS ABS: 3.7 10*3/uL (ref 1.4–6.5)
NEUTROS PCT: 53 %
Platelets: 362 10*3/uL (ref 150–440)
RBC: 3.85 MIL/uL (ref 3.80–5.20)
RDW: 13.5 % (ref 11.5–14.5)
WBC: 7 10*3/uL (ref 3.6–11.0)

## 2015-06-14 LAB — COMPREHENSIVE METABOLIC PANEL
ALT: 18 U/L (ref 14–54)
ANION GAP: 7 (ref 5–15)
AST: 24 U/L (ref 15–41)
Albumin: 3.8 g/dL (ref 3.5–5.0)
Alkaline Phosphatase: 112 U/L (ref 38–126)
BUN: 10 mg/dL (ref 6–20)
CO2: 30 mmol/L (ref 22–32)
Calcium: 9 mg/dL (ref 8.9–10.3)
Chloride: 101 mmol/L (ref 101–111)
Creatinine, Ser: 0.72 mg/dL (ref 0.44–1.00)
GFR calc Af Amer: >60 mL/min (ref >60)
Glucose, Bld: 130 mg/dL — ABNORMAL HIGH (ref 65–99)
POTASSIUM: 3.8 mmol/L (ref 3.5–5.1)
Sodium: 138 mmol/L (ref 135–145)
TOTAL PROTEIN: 7.4 g/dL (ref 6.5–8.1)
Total Bilirubin: 0.5 mg/dL (ref 0.3–1.2)

## 2015-06-14 NOTE — Progress Notes (Signed)
Kirsten Snyder  [phone]  Fax:([phone]) [place] DOB: 1933/11/28  MR#: [id number]  IRW#:[id number]  Patient Care Team: [name], MD as PCP - General (Family Medicine)  CHIEF COMPLAINT:  Chief Complaint  Patient presents with  . Breast Cancer    INTERVAL HISTORY: Patient returns to clinic for routine 6 month follow up. She feels well and is asymptomatic. Her only concern today is a lump on her right breast that has been there for several months. It has not grown or changed in appearance and is not painful. She denies shortness of breath or cough. She denies chest pain or palpitations. She has no pain. She has no neurological complaints.   REVIEW OF SYSTEMS:   Review of Systems  Constitutional: Negative.   HENT: Negative.   Respiratory: Negative.   Cardiovascular: Negative.   Gastrointestinal: Negative.   Genitourinary: Negative.   Musculoskeletal: Negative.     As per HPI. Otherwise, a complete review of systems is negatve.  ONCOLOGY HISTORY:  No history exists.    PAST MEDICAL HISTORY: Past Medical History  Diagnosis Date  . Allergy   . Depression   . GERD (gastroesophageal reflux disease)   . Hyperlipidemia   . Hypertension   . Gout   . Asthma   . Edema   . Cancer [facility])     right breast cancer 2012    PAST SURGICAL HISTORY: Past Surgical History  Procedure Laterality Date  . Vaginal hysterectomy    . Breast lumpectomy Right     removed a cancerous lump  . Breast biopsy Bilateral     negative  . Breast excisional biopsy Right     right positive 04/2010    FAMILY HISTORY Family History  Problem Relation Age of Onset  . Breast cancer Neg Hx     GYNECOLOGIC HISTORY:  No LMP recorded. Patient has had a hysterectomy.     ADVANCED DIRECTIVES:    HEALTH MAINTENANCE: Social History  Substance Use Topics  . Smoking status: Former Research scientist (life sciences)  . Smokeless tobacco: Not on file  . Alcohol Use: No     Allergies  Allergen Reactions  . Sulfa Antibiotics     Current Outpatient Prescriptions  Medication Sig Dispense Refill  . allopurinol (ZYLOPRIM) 100 MG tablet Take 1 tablet (100 mg total) by mouth daily. 90 tablet 1  . amoxicillin (AMOXIL) 500 MG capsule Take 1 capsule (500 mg total) by mouth 3 (three) times daily. 30 capsule 0  . anastrozole (ARIMIDEX) 1 MG tablet TAKE (1) TABLET BY MOUTH EVERY DAY 90 tablet 0  . aspirin 81 MG tablet Take 81 mg by mouth daily.    . benzonatate (TESSALON) 100 MG capsule Take 1 capsule (100 mg total) by mouth 2 (two) times daily as needed for cough. 20 capsule 5  . Fluticasone-Salmeterol (ADVAIR DISKUS) 100-50 MCG/DOSE AEPB Inhale 1 puff into the lungs 2 (two) times daily. 3 each 1  . furosemide (LASIX) 40 MG tablet Take 1 tablet (40 mg total) by mouth daily. Taking 1/2 tablet daily 90 tablet 1  . loratadine (CLARITIN) 10 MG tablet Take 1 tablet (10 mg total) by mouth daily. 90 tablet 1  . losartan (COZAAR) 100 MG tablet Take 1 tablet (100 mg total) by mouth daily. 90 tablet 1  . lovastatin (MEVACOR) 40 MG tablet Take 1 tablet (40 mg total) by mouth at bedtime. 90 tablet 1  . niacin (NIASPAN) 1000 MG CR tablet Take  1 tablet (1,000 mg total) by mouth at bedtime. 90 tablet 1  . nystatin cream (MYCOSTATIN) Apply 1 application topically as needed. 30 g 11  . omeprazole (PRILOSEC) 20 MG capsule Take 1 capsule (20 mg total) by mouth daily. 90 capsule 1  . sertraline (ZOLOFT) 50 MG tablet Take 1 tablet (50 mg total) by mouth daily. 90 tablet 1   No current facility-administered medications for this visit.    OBJECTIVE: BP 135/54 mmHg  Pulse 83  Temp(Src) 97.7 F (36.5 C) (Tympanic)  Resp 20  Wt 234 lb 5.6 oz (106.3 kg)   Body mass index is 41.52 kg/(m^2).    ECOG FS:0 - Asymptomatic  General: Well-developed, well-nourished, no acute distress. Eyes: Pink conjunctiva, anicteric sclera. Lungs: Clear to auscultation bilaterally. Heart: Regular rate and  rhythm. No rubs, murmurs, or gallops. Abdomen: Soft, nontender, nondistended. No organomegaly noted, normoactive bowel sounds. Breast: Right breast lump tender to touch, no distinct mass, mild discoloration without erythema. Musculoskeletal: No edema, cyanosis, or clubbing. Neuro: Alert, answering all questions appropriately. Cranial nerves grossly intact. Skin: No rashes or petechiae noted. Psych: Normal affect.   LAB RESULTS:  Appointment on 06/14/2015  Component Date Value Ref Range Status  . Sodium 06/14/2015 138  135 - 145 mmol/L Final  . Potassium 06/14/2015 3.8  3.5 - 5.1 mmol/L Final  . Chloride 06/14/2015 101  101 - 111 mmol/L Final  . CO2 06/14/2015 30  22 - 32 mmol/L Final  . Glucose, Bld 06/14/2015 130* 65 - 99 mg/dL Final  . BUN 06/14/2015 10  6 - 20 mg/dL Final  . Creatinine, Ser 06/14/2015 0.72  0.44 - 1.00 mg/dL Final  . Calcium 06/14/2015 9.0  8.9 - 10.3 mg/dL Final  . Total Protein 06/14/2015 7.4  6.5 - 8.1 g/dL Final  . Albumin 06/14/2015 3.8  3.5 - 5.0 g/dL Final  . AST 06/14/2015 24  15 - 41 U/L Final  . ALT 06/14/2015 18  14 - 54 U/L Final  . Alkaline Phosphatase 06/14/2015 112  38 - 126 U/L Final  . Total Bilirubin 06/14/2015 0.5  0.3 - 1.2 mg/dL Final  . GFR calc non Af Amer 06/14/2015 >60  >60 mL/min Final  . GFR calc Af Amer 06/14/2015 >60  >60 mL/min Final   Comment: (NOTE) The eGFR has been calculated using the CKD EPI equation. This calculation has not been validated in all clinical situations. eGFR's persistently <60 mL/min signify possible Chronic Kidney Disease.   . Anion gap 06/14/2015 7  5 - 15 Final  . WBC 06/14/2015 7.0  3.6 - 11.0 K/uL Final  . RBC 06/14/2015 3.85  3.80 - 5.20 MIL/uL Final  . Hemoglobin 06/14/2015 12.2  12.0 - 16.0 g/dL Final  . HCT 06/14/2015 36.0  35.0 - 47.0 % Final  . MCV 06/14/2015 93.5  80.0 - 100.0 fL Final  . MCH 06/14/2015 31.7  26.0 - 34.0 pg Final  . MCHC 06/14/2015 33.9  32.0 - 36.0 g/dL Final  . RDW  06/14/2015 13.5  11.5 - 14.5 % Final  . Platelets 06/14/2015 362  150 - 440 K/uL Final  . Neutrophils Relative % 06/14/2015 53   Final  . Neutro Abs 06/14/2015 3.7  1.4 - 6.5 K/uL Final  . Lymphocytes Relative 06/14/2015 34   Final  . Lymphs Abs 06/14/2015 2.4  1.0 - 3.6 K/uL Final  . Monocytes Relative 06/14/2015 6   Final  . Monocytes Absolute 06/14/2015 0.4  0.2 - 0.9 K/uL Final  .  Eosinophils Relative 06/14/2015 6   Final  . Eosinophils Absolute 06/14/2015 0.4  0 - 0.7 K/uL Final  . Basophils Relative 06/14/2015 1   Final  . Basophils Absolute 06/14/2015 0.1  0 - 0.1 K/uL Final    STUDIES: No results found.  ASSESSMENT: Pathologic stage IA (T1 B. N0 M0) infiltrating ductal carcinoma of right breast ER/PR positive HER-2/neu negative 06/16/10: Oncotype Dx: recurrence score = 14  PLAN:   1. Pathologic stage IA (T1 B. N0 M0) infiltrating ductal carcinoma of right breast ER/PR positive HER-2/neu negativestatus post wide local excision and sentinel node biopsy. Last mammogram December 2016 reports Bi-rads 2.  Repeat Mammogram December 2017. She is tolerating anastrozole and will finish in March 2017.  2. Breast lump: Not worrisome. Patient to call the office if it gets changes or gets larger. 3. Osteopenia: Last Bone density scan results -1.5 osteopenic which was worse than the previous -0.6 in July 2014. Patient encouraged to start taking Calcium and Vitamin D. She had not been taking calcium/vitamin d in the past. Repeat Bone density scan July 2018.   Patient expressed understanding and was in agreement with this plan. She also understands that She can call clinic at any time with any questions, concerns, or complaints.    No matching staging information was found for the patient.  Mayra Reel, NP   06/14/2015 11:08 AM  Patient seen and evaluated independently and I agree with the assessment and plan as dictated above.  Lloyd Huger, MD 06/15/2015 8:12 AM

## 2015-06-14 NOTE — Progress Notes (Signed)
Patient here today for ongoing follow up regarding breast cancer. Patient reports her last mammogram was in December 2016. Patient reports she has red, tender area to side of right breast that is concerning to her.

## 2015-10-24 ENCOUNTER — Ambulatory Visit: Payer: Medicare Other

## 2015-10-24 ENCOUNTER — Other Ambulatory Visit: Payer: Medicare Other

## 2015-11-06 ENCOUNTER — Telehealth: Payer: Self-pay | Admitting: *Deleted

## 2015-11-06 ENCOUNTER — Ambulatory Visit: Payer: Medicare Other | Admitting: Oncology

## 2015-11-06 NOTE — Telephone Encounter (Signed)
Patient called with concerns about red spot on her breast. Patient reports breast had same red spot at last clinic visit but now has developed foul smelling odor. I spoke with Trish who agrees to evaluate patient as add on tomorrow. Patient called and given appointment for tomorrow in [place].

## 2015-11-07 ENCOUNTER — Inpatient Hospital Stay: Payer: Medicare Other | Attending: Internal Medicine | Admitting: Oncology

## 2015-11-07 VITALS — BP 142/82 | HR 98 | Temp 97.1°F | Ht 63.0 in | Wt 239.3 lb

## 2015-11-07 DIAGNOSIS — Z170 Estrogen receptor positive status [ER+]: Secondary | ICD-10-CM | POA: Diagnosis not present

## 2015-11-07 DIAGNOSIS — Z79811 Long term (current) use of aromatase inhibitors: Secondary | ICD-10-CM | POA: Diagnosis not present

## 2015-11-07 DIAGNOSIS — K219 Gastro-esophageal reflux disease without esophagitis: Secondary | ICD-10-CM | POA: Diagnosis not present

## 2015-11-07 DIAGNOSIS — N611 Abscess of the breast and nipple: Secondary | ICD-10-CM | POA: Diagnosis not present

## 2015-11-07 DIAGNOSIS — E785 Hyperlipidemia, unspecified: Secondary | ICD-10-CM | POA: Diagnosis not present

## 2015-11-07 DIAGNOSIS — L723 Sebaceous cyst: Secondary | ICD-10-CM | POA: Diagnosis not present

## 2015-11-07 DIAGNOSIS — M109 Gout, unspecified: Secondary | ICD-10-CM | POA: Diagnosis not present

## 2015-11-07 DIAGNOSIS — Z792 Long term (current) use of antibiotics: Secondary | ICD-10-CM | POA: Insufficient documentation

## 2015-11-07 DIAGNOSIS — I10 Essential (primary) hypertension: Secondary | ICD-10-CM | POA: Insufficient documentation

## 2015-11-07 DIAGNOSIS — M8580 Other specified disorders of bone density and structure, unspecified site: Secondary | ICD-10-CM | POA: Diagnosis not present

## 2015-11-07 DIAGNOSIS — Z79899 Other long term (current) drug therapy: Secondary | ICD-10-CM | POA: Diagnosis not present

## 2015-11-07 DIAGNOSIS — C50911 Malignant neoplasm of unspecified site of right female breast: Secondary | ICD-10-CM | POA: Diagnosis not present

## 2015-11-07 DIAGNOSIS — Z87891 Personal history of nicotine dependence: Secondary | ICD-10-CM | POA: Diagnosis not present

## 2015-11-07 DIAGNOSIS — Z7982 Long term (current) use of aspirin: Secondary | ICD-10-CM | POA: Insufficient documentation

## 2015-11-07 DIAGNOSIS — L089 Local infection of the skin and subcutaneous tissue, unspecified: Secondary | ICD-10-CM | POA: Diagnosis not present

## 2015-11-07 MED ORDER — AMOXICILLIN-POT CLAVULANATE 875-125 MG PO TABS: 1.0000 | ORAL_TABLET | Freq: Two times a day (BID) | ORAL | Status: DC

## 2015-11-07 NOTE — Progress Notes (Signed)
Patient her for sick visit has a aprrox 3 cm reddened area on right breast with a yellow scab center. Patient states it drains from time to time and has a foul oder.

## 2015-11-07 NOTE — Progress Notes (Signed)
[place]  [phone]) [phone]  Fax:([phone]) [place] DOB: August 09, 1933  MR#: [id number]  OHY#:[id number]  Patient Care Team: [name], MD as PCP - General (Family Medicine)  CHIEF COMPLAINT:  Chief Complaint  Patient presents with  . Breast Cancer  . Follow-up    INTERVAL HISTORY: Patient returns to clinic today for a sore on her breast that has a foul odor. The patient had a small lump area on her right breast at her last visit that had been there for several months and was instructed that if it changed its appearance to call the office. She reports that about a month ago it started to change in appearance and had a yellow center. About a week ago she noticed that it started to have a foul odor. It does not hurt or itch. She denies fever and has no other concerns today.   REVIEW OF SYSTEMS:   Review of Systems  Constitutional: Negative.   HENT: Negative.   Respiratory: Negative.   Cardiovascular: Negative.   Gastrointestinal: Negative.   Genitourinary: Negative.   Musculoskeletal: Negative.   Skin:       3 cm reddened area on right breast with a yellow scab center    As per HPI. Otherwise, a complete review of systems is negatve.  ONCOLOGY HISTORY:  No history exists.    PAST MEDICAL HISTORY: Past Medical History  Diagnosis Date  . Allergy   . Depression   . GERD (gastroesophageal reflux disease)   . Hyperlipidemia   . Hypertension   . Gout   . Asthma   . Edema   . Cancer [facility])     right breast cancer 2012    PAST SURGICAL HISTORY: Past Surgical History  Procedure Laterality Date  . Vaginal hysterectomy    . Breast lumpectomy Right     removed a cancerous lump  . Breast biopsy Bilateral     negative  . Breast excisional biopsy Right     right positive 04/2010    FAMILY HISTORY Family History  Problem Relation Age of Onset  . Breast cancer Neg Hx     GYNECOLOGIC HISTORY:  No LMP recorded. Patient has had a  hysterectomy.     ADVANCED DIRECTIVES:    HEALTH MAINTENANCE: Social History  Substance Use Topics  . Smoking status: Former Research scientist (life sciences)  . Smokeless tobacco: Not on file  . Alcohol Use: No    Allergies  Allergen Reactions  . Sulfa Antibiotics     Current Outpatient Prescriptions  Medication Sig Dispense Refill  . allopurinol (ZYLOPRIM) 100 MG tablet Take 1 tablet (100 mg total) by mouth daily. 90 tablet 1  . anastrozole (ARIMIDEX) 1 MG tablet TAKE (1) TABLET BY MOUTH EVERY DAY 90 tablet 0  . aspirin 81 MG tablet Take 81 mg by mouth daily.    . benzonatate (TESSALON) 100 MG capsule Take 1 capsule (100 mg total) by mouth 2 (two) times daily as needed for cough. 20 capsule 5  . Fluticasone-Salmeterol (ADVAIR DISKUS) 100-50 MCG/DOSE AEPB Inhale 1 puff into the lungs 2 (two) times daily. 3 each 1  . furosemide (LASIX) 40 MG tablet Take 1 tablet (40 mg total) by mouth daily. Taking 1/2 tablet daily 90 tablet 1  . loratadine (CLARITIN) 10 MG tablet Take 1 tablet (10 mg total) by mouth daily. 90 tablet 1  . losartan (COZAAR) 100 MG tablet Take 1 tablet (100 mg total) by mouth daily. [place]  tablet 1  . lovastatin (MEVACOR) 40 MG tablet Take 1 tablet (40 mg total) by mouth at bedtime. 90 tablet 1  . niacin (NIASPAN) 1000 MG CR tablet Take 1 tablet (1,000 mg total) by mouth at bedtime. 90 tablet 1  . nystatin cream (MYCOSTATIN) Apply 1 application topically as needed. 30 g 11  . omeprazole (PRILOSEC) 20 MG capsule Take 1 capsule (20 mg total) by mouth daily. 90 capsule 1  . sertraline (ZOLOFT) 50 MG tablet Take 1 tablet (50 mg total) by mouth daily. 90 tablet 1  . amoxicillin-clavulanate (AUGMENTIN) 875-125 MG tablet Take 1 tablet by mouth 2 (two) times daily. 28 tablet 0   No current facility-administered medications for this visit.    OBJECTIVE: BP 142/82 mmHg  Pulse 98  Temp(Src) 97.1 F (36.2 C) (Tympanic)  Ht _0  (1.6 m)  Wt 239 lb 5 oz (108.55 kg)  BMI 42.40 kg/m2   Body mass index  is 42.4 kg/(m^2).    ECOG FS:0 - Asymptomatic  General: Well-developed, well-nourished, no acute distress. Eyes: Pink conjunctiva, anicteric sclera. Lungs: Clear to auscultation bilaterally. Heart: Regular rate and rhythm. No rubs, murmurs, or gallops. Abdomen: Soft, nontender, nondistended. No organomegaly noted, normoactive bowel sounds. Breast: Right breast lump, about 3 cm, erythremic with crusted center, foul odorous discharge Musculoskeletal: No edema, cyanosis, or clubbing. Neuro: Alert, answering all questions appropriately. Cranial nerves grossly intact. Skin: No rashes or petechiae noted. Psych: Normal affect.   LAB RESULTS:  No visits with results within 3 Day(s) from this visit. Latest known visit with results is:  Appointment on 06/14/2015  Component Date Value Ref Range Status  . Sodium 06/14/2015 138  135 - 145 mmol/L Final  . Potassium 06/14/2015 3.8  3.5 - 5.1 mmol/L Final  . Chloride 06/14/2015 101  101 - 111 mmol/L Final  . CO2 06/14/2015 30  22 - 32 mmol/L Final  . Glucose, Bld 06/14/2015 130* 65 - 99 mg/dL Final  . BUN 06/14/2015 10  6 - 20 mg/dL Final  . Creatinine, Ser 06/14/2015 0.72  0.44 - 1.00 mg/dL Final  . Calcium 06/14/2015 9.0  8.9 - 10.3 mg/dL Final  . Total Protein 06/14/2015 7.4  6.5 - 8.1 g/dL Final  . Albumin 06/14/2015 3.8  3.5 - 5.0 g/dL Final  . AST 06/14/2015 24  15 - 41 U/L Final  . ALT 06/14/2015 18  14 - 54 U/L Final  . Alkaline Phosphatase 06/14/2015 112  38 - 126 U/L Final  . Total Bilirubin 06/14/2015 0.5  0.3 - 1.2 mg/dL Final  . GFR calc non Af Amer 06/14/2015 >60  >60 mL/min Final  . GFR calc Af Amer 06/14/2015 >60  >60 mL/min Final   Comment: (NOTE) The eGFR has been calculated using the CKD EPI equation. This calculation has not been validated in all clinical situations. eGFR's persistently <60 mL/min signify possible Chronic Kidney Disease.   . Anion gap 06/14/2015 7  5 - 15 Final  . WBC 06/14/2015 7.0  3.6 - 11.0 K/uL  Final  . RBC 06/14/2015 3.85  3.80 - 5.20 MIL/uL Final  . Hemoglobin 06/14/2015 12.2  12.0 - 16.0 g/dL Final  . HCT 06/14/2015 36.0  35.0 - 47.0 % Final  . MCV 06/14/2015 93.5  80.0 - 100.0 fL Final  . MCH 06/14/2015 31.7  26.0 - 34.0 pg Final  . MCHC 06/14/2015 33.9  32.0 - 36.0 g/dL Final  . RDW 06/14/2015 13.5  11.5 - 14.5 % Final  .  Platelets 06/14/2015 362  150 - 440 K/uL Final  . Neutrophils Relative % 06/14/2015 53   Final  . Neutro Abs 06/14/2015 3.7  1.4 - 6.5 K/uL Final  . Lymphocytes Relative 06/14/2015 34   Final  . Lymphs Abs 06/14/2015 2.4  1.0 - 3.6 K/uL Final  . Monocytes Relative 06/14/2015 6   Final  . Monocytes Absolute 06/14/2015 0.4  0.2 - 0.9 K/uL Final  . Eosinophils Relative 06/14/2015 6   Final  . Eosinophils Absolute 06/14/2015 0.4  0 - 0.7 K/uL Final  . Basophils Relative 06/14/2015 1   Final  . Basophils Absolute 06/14/2015 0.1  0 - 0.1 K/uL Final    STUDIES: No results found.  ASSESSMENT: Pathologic stage IA (T1 B. N0 M0) infiltrating ductal carcinoma of right breast ER/PR positive HER-2/neu negative 06/16/10: Oncotype Dx: recurrence score = 14  PLAN:   1. Pathologic stage IA (T1 B. N0 M0) infiltrating ductal carcinoma of right breast ER/PR positive HER-2/neu negativestatus post wide local excision and sentinel node biopsy. Last mammogram December 2016 reports Bi-rads 2.  Repeat Mammogram December 2017. She is tolerating anastrozole and will finish in March 2017.  2. Breast lump: Augmentin BID x 14 days, Follow up with surgeon Dr Tamala Julian, he is willing to see her today. 3. Osteopenia: Last Bone density scan results -1.5 osteopenic which was worse than the previous -0.6 in July 2014. Patient encouraged to start taking Calcium and Vitamin D. She had not been taking calcium/vitamin d in the past. Repeat Bone density scan July 2018.   Patient expressed understanding and was in agreement with this plan. She also understands that She can call clinic at any time  with any questions, concerns, or complaints.   Dr. Rogue Bussing was available for consultation and review of plan of care for this patient.   Mayra Reel, NP   11/07/2015 2:36 PM

## 2015-11-07 NOTE — Progress Notes (Signed)
Opened in error

## 2015-12-10 ENCOUNTER — Other Ambulatory Visit: Payer: Self-pay

## 2015-12-13 ENCOUNTER — Encounter: Payer: Self-pay | Admitting: Family Medicine

## 2015-12-13 ENCOUNTER — Ambulatory Visit (INDEPENDENT_AMBULATORY_CARE_PROVIDER_SITE_OTHER): Payer: Medicare Other | Admitting: Family Medicine

## 2015-12-13 VITALS — BP 110/78 | HR 82 | Ht 63.0 in | Wt 238.0 lb

## 2015-12-13 DIAGNOSIS — K219 Gastro-esophageal reflux disease without esophagitis: Secondary | ICD-10-CM

## 2015-12-13 DIAGNOSIS — M129 Arthropathy, unspecified: Secondary | ICD-10-CM

## 2015-12-13 DIAGNOSIS — F329 Major depressive disorder, single episode, unspecified: Secondary | ICD-10-CM | POA: Diagnosis not present

## 2015-12-13 DIAGNOSIS — J4520 Mild intermittent asthma, uncomplicated: Secondary | ICD-10-CM | POA: Diagnosis not present

## 2015-12-13 DIAGNOSIS — E784 Other hyperlipidemia: Secondary | ICD-10-CM

## 2015-12-13 DIAGNOSIS — E790 Hyperuricemia without signs of inflammatory arthritis and tophaceous disease: Secondary | ICD-10-CM | POA: Diagnosis not present

## 2015-12-13 DIAGNOSIS — I10 Essential (primary) hypertension: Secondary | ICD-10-CM | POA: Diagnosis not present

## 2015-12-13 DIAGNOSIS — F32A Depression, unspecified: Secondary | ICD-10-CM

## 2015-12-13 DIAGNOSIS — M1710 Unilateral primary osteoarthritis, unspecified knee: Secondary | ICD-10-CM

## 2015-12-13 DIAGNOSIS — E7849 Other hyperlipidemia: Secondary | ICD-10-CM

## 2015-12-13 DIAGNOSIS — J301 Allergic rhinitis due to pollen: Secondary | ICD-10-CM

## 2015-12-13 MED ORDER — LOSARTAN POTASSIUM 100 MG PO TABS: 100.0000 mg | ORAL_TABLET | Freq: Every day | ORAL | 1 refills | Status: DC

## 2015-12-13 MED ORDER — ALLOPURINOL 100 MG PO TABS: 100.0000 mg | ORAL_TABLET | Freq: Every day | ORAL | 1 refills | Status: DC

## 2015-12-13 MED ORDER — LORATADINE 10 MG PO TABS: 10.0000 mg | ORAL_TABLET | Freq: Every day | ORAL | 1 refills | Status: DC

## 2015-12-13 MED ORDER — LOVASTATIN 40 MG PO TABS: 40.0000 mg | ORAL_TABLET | Freq: Every day | ORAL | 1 refills | Status: DC

## 2015-12-13 MED ORDER — FUROSEMIDE 40 MG PO TABS
40.0000 mg | ORAL_TABLET | Freq: Every day | ORAL | 1 refills | Status: DC
Start: 2015-12-13 — End: 2016-12-04

## 2015-12-13 MED ORDER — FLUTICASONE-SALMETEROL 100-50 MCG/DOSE IN AEPB: 1.0000 | INHALATION_SPRAY | Freq: Two times a day (BID) | RESPIRATORY_TRACT | 1 refills | Status: DC

## 2015-12-13 MED ORDER — SERTRALINE HCL 50 MG PO TABS
50.0000 mg | ORAL_TABLET | Freq: Every day | ORAL | 1 refills | Status: DC
Start: 2015-12-13 — End: 2016-10-16

## 2015-12-13 MED ORDER — OMEPRAZOLE 20 MG PO CPDR: 20.0000 mg | DELAYED_RELEASE_CAPSULE | Freq: Every day | ORAL | 1 refills | Status: DC

## 2015-12-13 NOTE — Progress Notes (Signed)
Name: Kirsten Snyder   MRN: [id number]    DOB: 02/05/1934   Date:12/13/2015       Progress Note  Subjective  Chief Complaint  Chief Complaint  Patient presents with  . Depression  . Gastroesophageal Reflux  . Hypertension  . Edema    takes Furosemide for swelling  . Gout  . Hyperlipidemia  . Allergic Rhinitis     Depression         This is a chronic problem.  The current episode started more than 1 year ago.   The onset quality is gradual.   Associated symptoms include decreased concentration.  Associated symptoms include no fatigue, no helplessness, no hopelessness, does not have insomnia, not irritable, no restlessness, no decreased interest, no appetite change, no body aches, no myalgias, no headaches, no indigestion, not sad and no suicidal ideas.     The symptoms are aggravated by nothing.  Past treatments include SSRIs - Selective serotonin reuptake inhibitors. Gastroesophageal Reflux  She reports no abdominal pain, no belching, no chest pain, no choking, no coughing, no heartburn, no nausea, no sore throat or no wheezing. This is a chronic problem. The current episode started more than 1 year ago. Pertinent negatives include no fatigue, melena or weight loss. She has tried a PPI for the symptoms. The treatment provided moderate relief. Past procedures do not include an EGD, esophageal manometry, esophageal pH monitoring, H. pylori antibody titer or a UGI.  Hypertension  This is a chronic problem. The problem has been gradually improving since onset. The problem is controlled. Pertinent negatives include no blurred vision, chest pain, headaches, malaise/fatigue, neck pain, palpitations or shortness of breath. Agents associated with hypertension include steroids. Risk factors for coronary artery disease include post-menopausal state. Past treatments include angiotensin blockers and diuretics. The current treatment provides mild improvement. There are no compliance problems.  There is no  history of angina, kidney disease, CAD/MI, CVA, heart failure, left ventricular hypertrophy, PVD, renovascular disease or retinopathy. There is no history of a hypertension causing med.  Hyperlipidemia  This is a chronic problem. The problem is controlled. Pertinent negatives include no chest pain, focal weakness, myalgias or shortness of breath. Current antihyperlipidemic treatment includes statins. The current treatment provides moderate improvement of lipids. There are no compliance problems.  Risk factors for coronary artery disease include obesity.    No problem-specific Assessment & Plan notes found for this encounter.   Past Medical History:  Diagnosis Date  . Allergy   . Asthma   . Cancer [facility])    right breast cancer 2012  . Depression   . Edema   . GERD (gastroesophageal reflux disease)   . Gout   . Hyperlipidemia   . Hypertension     Past Surgical History:  Procedure Laterality Date  . BREAST BIOPSY Bilateral    negative  . BREAST EXCISIONAL BIOPSY Right    right positive 04/2010  . BREAST LUMPECTOMY Right    removed a cancerous lump  . VAGINAL HYSTERECTOMY      Family History  Problem Relation Age of Onset  . Breast cancer Neg Hx     Social History   Social History  . Marital status: Widowed    Spouse name: N/A  . Number of children: N/A  . Years of education: N/A   Occupational History  . Not on file.   Social History Main Topics  . Smoking status: Former Research scientist (life sciences)  . Smokeless tobacco: Not on file  . Alcohol use  No  . Drug use: No  . Sexual activity: Not Currently   Other Topics Concern  . Not on file   Social History Narrative  . No narrative on file    Allergies  Allergen Reactions  . Sulfa Antibiotics      Review of Systems  Constitutional: Negative for appetite change, chills, fatigue, fever, malaise/fatigue and weight loss.  HENT: Negative for ear discharge, ear pain and sore throat.   Eyes: Negative for blurred vision.   Respiratory: Negative for cough, sputum production, choking, shortness of breath and wheezing.   Cardiovascular: Negative for chest pain, palpitations and leg swelling.  Gastrointestinal: Negative for abdominal pain, blood in stool, constipation, diarrhea, heartburn, melena and nausea.  Genitourinary: Negative for dysuria, frequency, hematuria and urgency.  Musculoskeletal: Negative for back pain, joint pain, myalgias and neck pain.  Skin: Negative for rash.  Neurological: Negative for dizziness, tingling, sensory change, focal weakness and headaches.  Endo/Heme/Allergies: Negative for environmental allergies and polydipsia. Does not bruise/bleed easily.  Psychiatric/Behavioral: Positive for decreased concentration and depression. Negative for suicidal ideas. The patient is not nervous/anxious and does not have insomnia.      Objective  Vitals:   12/13/15 1335  BP: 110/78  Pulse: 82  Weight: 238 lb (108 kg)  Height: 5\' 3"  (1.6 m)    Physical Exam  Constitutional: She is well-developed, well-nourished, and in no distress. She is not irritable. No distress.  HENT:  Head: Normocephalic and atraumatic.  Right Ear: External ear normal.  Left Ear: External ear normal.  Nose: Nose normal.  Mouth/Throat: Oropharynx is clear and moist.  Eyes: Conjunctivae and EOM are normal. Pupils are equal, round, and reactive to light. Right eye exhibits no discharge. Left eye exhibits no discharge.  Neck: Normal range of motion. Neck supple. No JVD present. No thyromegaly present.  Cardiovascular: Normal rate, regular rhythm, normal heart sounds and intact distal pulses.  Exam reveals no gallop and no friction rub.   No murmur heard. Pulmonary/Chest: Effort normal and breath sounds normal. She has no wheezes. She has no rales.  Abdominal: Soft. Bowel sounds are normal. She exhibits no mass. There is no tenderness. There is no guarding.  Musculoskeletal: Normal range of motion. She exhibits no edema.   Lymphadenopathy:    She has no cervical adenopathy.  Neurological: She is alert. She has normal reflexes.  Skin: Skin is warm and dry. She is not diaphoretic.  Psychiatric: Mood and affect normal.  Nursing note and vitals reviewed.     Assessment & Plan  Problem List Items Addressed This Visit      Cardiovascular and Mediastinum   Essential hypertension - Primary   Relevant Medications   furosemide (LASIX) 40 MG tablet   losartan (COZAAR) 100 MG tablet   lovastatin (MEVACOR) 40 MG tablet   Other Relevant Orders   Renal Function Panel     Respiratory   Airway hyperreactivity   Relevant Medications   Fluticasone-Salmeterol (ADVAIR DISKUS) 100-50 MCG/DOSE AEPB     Digestive   Acid reflux   Relevant Medications   omeprazole (PRILOSEC) 20 MG capsule     Musculoskeletal and Integument   Arthritis of knee   Relevant Medications   allopurinol (ZYLOPRIM) 100 MG tablet     Other   Familial multiple lipoprotein-type hyperlipidemia   Relevant Medications   furosemide (LASIX) 40 MG tablet   losartan (COZAAR) 100 MG tablet   lovastatin (MEVACOR) 40 MG tablet   Other Relevant Orders   Lipid  Profile   Clinical depression   Relevant Medications   sertraline (ZOLOFT) 50 MG tablet    Other Visit Diagnoses    Seasonal allergic rhinitis due to pollen       Relevant Medications   Fluticasone-Salmeterol (ADVAIR DISKUS) 100-50 MCG/DOSE AEPB   loratadine (CLARITIN) 10 MG tablet   Hyperuricemia       Relevant Medications   allopurinol (ZYLOPRIM) 100 MG tablet        Dr. [facility]  12/13/15

## 2015-12-14 LAB — RENAL FUNCTION PANEL
ALBUMIN: 4.2 g/dL (ref 3.5–4.7)
BUN/Creatinine Ratio: 14 (ref 12–28)
BUN: 10 mg/dL (ref 8–27)
CHLORIDE: 98 mmol/L (ref 96–106)
CO2: 29 mmol/L (ref 18–29)
CREATININE: 0.72 mg/dL (ref 0.57–1.00)
Calcium: 9.2 mg/dL (ref 8.7–10.3)
GFR, EST AFRICAN AMERICAN: 91 mL/min/{1.73_m2} (ref >59)
GFR, EST NON AFRICAN AMERICAN: 79 mL/min/{1.73_m2} (ref >59)
GLUCOSE: 104 mg/dL — AB (ref 65–99)
POTASSIUM: 4.6 mmol/L (ref 3.5–5.2)
Phosphorus: 3.3 mg/dL (ref 2.5–4.5)
SODIUM: 142 mmol/L (ref 134–144)

## 2015-12-14 LAB — LIPID PANEL
CHOL/HDL RATIO: 4.2 ratio (ref 0.0–4.4)
Cholesterol, Total: 200 mg/dL — ABNORMAL HIGH (ref 100–199)
HDL: 48 mg/dL (ref >39)
LDL CALC: 112 mg/dL — AB (ref 0–99)
Triglycerides: 200 mg/dL — ABNORMAL HIGH (ref 0–149)
VLDL CHOLESTEROL CAL: 40 mg/dL (ref 5–40)

## 2016-01-03 ENCOUNTER — Encounter: Payer: Self-pay | Admitting: Family Medicine

## 2016-01-03 ENCOUNTER — Ambulatory Visit (INDEPENDENT_AMBULATORY_CARE_PROVIDER_SITE_OTHER): Payer: Medicare Other | Admitting: Family Medicine

## 2016-01-03 VITALS — BP 110/62 | HR 68 | Temp 98.3°F | Ht 63.0 in | Wt 238.0 lb

## 2016-01-03 DIAGNOSIS — J40 Bronchitis, not specified as acute or chronic: Secondary | ICD-10-CM | POA: Diagnosis not present

## 2016-01-03 MED ORDER — GUAIFENESIN-CODEINE 100-10 MG/5ML PO SYRP: 5.0000 mL | ORAL_SOLUTION | Freq: Three times a day (TID) | ORAL | 0 refills | Status: DC | PRN

## 2016-01-03 MED ORDER — AMOXICILLIN 500 MG PO CAPS: 500.0000 mg | ORAL_CAPSULE | Freq: Three times a day (TID) | ORAL | 0 refills | Status: DC

## 2016-01-03 NOTE — Progress Notes (Signed)
Name: Kirsten Snyder   MRN: [id number]    DOB: 03/24/1934   Date:01/03/2016       Progress Note  Subjective  Chief Complaint  Chief Complaint  Patient presents with  . Sinusitis    cough and cong- no production    Sinusitis  This is a new problem. The current episode started in the past 7 days. The problem has been gradually worsening since onset. There has been no fever. The pain is mild. Associated symptoms include chills, congestion, coughing, ear pain, headaches, a hoarse voice, shortness of breath, sinus pressure, sneezing, a sore throat and swollen glands. Pertinent negatives include no diaphoresis or neck pain. Past treatments include nothing. The treatment provided mild relief.    No problem-specific Assessment & Plan notes found for this encounter.   Past Medical History:  Diagnosis Date  . Allergy   . Asthma   . Cancer [facility])    right breast cancer 2012  . Depression   . Edema   . GERD (gastroesophageal reflux disease)   . Gout   . Hyperlipidemia   . Hypertension     Past Surgical History:  Procedure Laterality Date  . BREAST BIOPSY Bilateral    negative  . BREAST EXCISIONAL BIOPSY Right    right positive 04/2010  . BREAST LUMPECTOMY Right    removed a cancerous lump  . VAGINAL HYSTERECTOMY      Family History  Problem Relation Age of Onset  . Breast cancer Neg Hx     Social History   Social History  . Marital status: Widowed    Spouse name: N/A  . Number of children: N/A  . Years of education: N/A   Occupational History  . Not on file.   Social History Main Topics  . Smoking status: Former Research scientist (life sciences)  . Smokeless tobacco: Not on file  . Alcohol use No  . Drug use: No  . Sexual activity: Not Currently   Other Topics Concern  . Not on file   Social History Narrative  . No narrative on file    Allergies  Allergen Reactions  . Sulfa Antibiotics      Review of Systems  Constitutional: Positive for chills. Negative for diaphoresis, fever,  malaise/fatigue and weight loss.  HENT: Positive for congestion, ear pain, hoarse voice, sinus pressure, sneezing and sore throat. Negative for ear discharge.   Eyes: Negative for blurred vision.  Respiratory: Positive for cough and shortness of breath. Negative for sputum production and wheezing.   Cardiovascular: Negative for chest pain, palpitations and leg swelling.  Gastrointestinal: Negative for abdominal pain, blood in stool, constipation, diarrhea, heartburn, melena and nausea.  Genitourinary: Negative for dysuria, frequency, hematuria and urgency.  Musculoskeletal: Negative for back pain, joint pain, myalgias and neck pain.  Skin: Negative for rash.  Neurological: Positive for headaches. Negative for dizziness, tingling, sensory change and focal weakness.  Endo/Heme/Allergies: Negative for environmental allergies and polydipsia. Does not bruise/bleed easily.  Psychiatric/Behavioral: Negative for depression and suicidal ideas. The patient is not nervous/anxious and does not have insomnia.      Objective  Vitals:   01/03/16 1557  BP: 110/62  Pulse: 68  Temp: 98.3 F (36.8 C)  TempSrc: Oral  Weight: 238 lb (108 kg)  Height: 5\' 3"  (1.6 m)    Physical Exam  Constitutional: She is well-developed, well-nourished, and in no distress. No distress.  HENT:  Head: Normocephalic and atraumatic.  Right Ear: External ear normal.  Left Ear: External ear normal.  Nose: Nose normal.  Mouth/Throat: Oropharynx is clear and moist.  Eyes: Conjunctivae and EOM are normal. Pupils are equal, round, and reactive to light. Right eye exhibits no discharge. Left eye exhibits no discharge.  Neck: Normal range of motion. Neck supple. No JVD present. No thyromegaly present.  Cardiovascular: Normal rate, regular rhythm, normal heart sounds and intact distal pulses.  Exam reveals no gallop and no friction rub.   No murmur heard. Pulmonary/Chest: Effort normal and breath sounds normal. She has no  wheezes. She has no rales.  Abdominal: Soft. Bowel sounds are normal. She exhibits no mass. There is no tenderness. There is no guarding.  Musculoskeletal: Normal range of motion. She exhibits no edema.  Lymphadenopathy:    She has no cervical adenopathy.  Neurological: She is alert.  Skin: Skin is warm and dry. She is not diaphoretic.  Psychiatric: Mood and affect normal.  Nursing note and vitals reviewed.     Assessment & Plan  Problem List Items Addressed This Visit    None    Visit Diagnoses    Bronchitis    -  Primary   Relevant Medications   amoxicillin (AMOXIL) 500 MG capsule   guaiFENesin-codeine (ROBITUSSIN AC) 100-10 MG/5ML syrup        Dr. [facility]  01/03/16

## 2016-01-15 DIAGNOSIS — Z853 Personal history of malignant neoplasm of breast: Secondary | ICD-10-CM | POA: Diagnosis not present

## 2016-01-15 DIAGNOSIS — N6081 Other benign mammary dysplasias of right breast: Secondary | ICD-10-CM | POA: Diagnosis not present

## 2016-03-24 ENCOUNTER — Encounter: Payer: Self-pay | Admitting: Family Medicine

## 2016-03-24 ENCOUNTER — Other Ambulatory Visit: Payer: Self-pay | Admitting: *Deleted

## 2016-03-24 ENCOUNTER — Encounter: Payer: Self-pay | Admitting: Emergency Medicine

## 2016-03-24 ENCOUNTER — Emergency Department: Payer: Medicare Other

## 2016-03-24 ENCOUNTER — Ambulatory Visit (INDEPENDENT_AMBULATORY_CARE_PROVIDER_SITE_OTHER): Payer: Medicare Other | Admitting: Family Medicine

## 2016-03-24 ENCOUNTER — Emergency Department
Admission: EM | Admit: 2016-03-24 | Discharge: 2016-03-24 | Disposition: A | Discharge status: Home / Self Care | Payer: Medicare Other | Attending: Emergency Medicine | Admitting: Emergency Medicine

## 2016-03-24 VITALS — BP 116/82 | HR 68 | Temp 101.8°F | Resp 12 | Ht 63.0 in | Wt 238.0 lb

## 2016-03-24 DIAGNOSIS — E860 Dehydration: Secondary | ICD-10-CM | POA: Diagnosis not present

## 2016-03-24 DIAGNOSIS — R05 Cough: Secondary | ICD-10-CM | POA: Diagnosis present

## 2016-03-24 DIAGNOSIS — R0602 Shortness of breath: Secondary | ICD-10-CM | POA: Diagnosis not present

## 2016-03-24 DIAGNOSIS — J45909 Unspecified asthma, uncomplicated: Secondary | ICD-10-CM | POA: Insufficient documentation

## 2016-03-24 DIAGNOSIS — J189 Pneumonia, unspecified organism: Secondary | ICD-10-CM | POA: Diagnosis not present

## 2016-03-24 DIAGNOSIS — I10 Essential (primary) hypertension: Secondary | ICD-10-CM | POA: Diagnosis not present

## 2016-03-24 DIAGNOSIS — Z79899 Other long term (current) drug therapy: Secondary | ICD-10-CM | POA: Insufficient documentation

## 2016-03-24 DIAGNOSIS — J40 Bronchitis, not specified as acute or chronic: Secondary | ICD-10-CM | POA: Diagnosis not present

## 2016-03-24 DIAGNOSIS — Z7982 Long term (current) use of aspirin: Secondary | ICD-10-CM | POA: Insufficient documentation

## 2016-03-24 DIAGNOSIS — Z853 Personal history of malignant neoplasm of breast: Secondary | ICD-10-CM | POA: Diagnosis not present

## 2016-03-24 DIAGNOSIS — Z87891 Personal history of nicotine dependence: Secondary | ICD-10-CM | POA: Diagnosis not present

## 2016-03-24 LAB — COMPREHENSIVE METABOLIC PANEL
ALBUMIN: 3.8 g/dL (ref 3.5–5.0)
ALK PHOS: 118 U/L (ref 38–126)
ALT: 21 U/L (ref 14–54)
ANION GAP: 9 (ref 5–15)
AST: 25 U/L (ref 15–41)
BILIRUBIN TOTAL: 1.1 mg/dL (ref 0.3–1.2)
BUN: 12 mg/dL (ref 6–20)
CALCIUM: 8.9 mg/dL (ref 8.9–10.3)
CO2: 29 mmol/L (ref 22–32)
Chloride: 97 mmol/L — ABNORMAL LOW (ref 101–111)
Creatinine, Ser: 0.91 mg/dL (ref 0.44–1.00)
GFR calc Af Amer: >60 mL/min (ref >60)
GFR calc non Af Amer: 58 mL/min — ABNORMAL LOW (ref >60)
GLUCOSE: 118 mg/dL — AB (ref 65–99)
Potassium: 3.7 mmol/L (ref 3.5–5.1)
SODIUM: 135 mmol/L (ref 135–145)
TOTAL PROTEIN: 7.7 g/dL (ref 6.5–8.1)

## 2016-03-24 LAB — CBC WITH DIFFERENTIAL/PLATELET
BASOS ABS: 0 10*3/uL (ref 0–0.1)
BASOS PCT: 0 %
EOS PCT: 2 %
Eosinophils Absolute: 0.3 10*3/uL (ref 0–0.7)
HEMATOCRIT: 34.6 % — AB (ref 35.0–47.0)
Hemoglobin: 12 g/dL (ref 12.0–16.0)
Lymphocytes Relative: 14 %
Lymphs Abs: 2.1 10*3/uL (ref 1.0–3.6)
MCH: 32.3 pg (ref 26.0–34.0)
MCHC: 34.6 g/dL (ref 32.0–36.0)
MCV: 93.5 fL (ref 80.0–100.0)
MONO ABS: 1 10*3/uL — AB (ref 0.2–0.9)
MONOS PCT: 6 %
NEUTROS ABS: 11.5 10*3/uL — AB (ref 1.4–6.5)
Neutrophils Relative %: 78 %
PLATELETS: 345 10*3/uL (ref 150–440)
RBC: 3.7 MIL/uL — ABNORMAL LOW (ref 3.80–5.20)
RDW: 13.5 % (ref 11.5–14.5)
WBC: 15 10*3/uL — ABNORMAL HIGH (ref 3.6–11.0)

## 2016-03-24 LAB — URINALYSIS, COMPLETE (UACMP) WITH MICROSCOPIC
BACTERIA UA: NONE SEEN
Bilirubin Urine: NEGATIVE
Glucose, UA: NEGATIVE mg/dL
Hgb urine dipstick: NEGATIVE
Ketones, ur: NEGATIVE mg/dL
Nitrite: NEGATIVE
PROTEIN: 30 mg/dL — AB
SPECIFIC GRAVITY, URINE: 1.017 (ref 1.005–1.030)
pH: 6 (ref 5.0–8.0)

## 2016-03-24 LAB — POCT INFLUENZA A/B
INFLUENZA A, POC: NEGATIVE
INFLUENZA B, POC: NEGATIVE

## 2016-03-24 LAB — LACTIC ACID, PLASMA: Lactic Acid, Venous: 1 mmol/L (ref 0.5–1.9)

## 2016-03-24 MED ORDER — ALBUTEROL SULFATE (2.5 MG/3ML) 0.083% IN NEBU: 2.5000 mg | INHALATION_SOLUTION | Freq: Four times a day (QID) | RESPIRATORY_TRACT | 12 refills | Status: DC | PRN

## 2016-03-24 MED ORDER — AZITHROMYCIN 500 MG PO TABS
500.0000 mg | ORAL_TABLET | Freq: Once | ORAL | Status: AC
  Administered 2016-03-24: 500 mg via ORAL
  Filled 2016-03-24: qty 1

## 2016-03-24 MED ORDER — HYDROCOD POLST-CPM POLST ER 10-8 MG/5ML PO SUER: 5.0000 mL | Freq: Every evening | ORAL | 0 refills | Status: DC | PRN

## 2016-03-24 MED ORDER — AZITHROMYCIN 250 MG PO TABS: ORAL_TABLET | ORAL | 0 refills | Status: DC

## 2016-03-24 MED ORDER — AMOXICILLIN-POT CLAVULANATE 875-125 MG PO TABS: 1.0000 | ORAL_TABLET | Freq: Two times a day (BID) | ORAL | 0 refills | Status: DC

## 2016-03-24 NOTE — Discharge Instructions (Signed)

## 2016-03-24 NOTE — Progress Notes (Signed)
Name: Kirsten Snyder   MRN: [id number]    DOB: Jan 31, 1934   Date:03/24/2016       Progress Note  Subjective  Chief Complaint  Chief Complaint  Patient presents with  . Cough    Pt stated coughing, congested, losing voice for 1 week    Cough  This is a new problem. The current episode started in the past 7 days. The problem has been gradually worsening. The problem occurs every few minutes. The cough is productive of purulent sputum ('brown "beyond green"). Associated symptoms include chest pain, nasal congestion and rhinorrhea. Pertinent negatives include no chills, ear congestion, ear pain, fever, headaches, heartburn, hemoptysis, myalgias, postnasal drip, rash, sore throat, shortness of breath, sweats, weight loss or wheezing. Associated symptoms comments: "catch in this breast". Treatments tried: tylenol. The treatment provided no relief. There is no history of asthma, bronchiectasis, bronchitis, COPD, emphysema, environmental allergies or pneumonia.    No problem-specific Assessment & Plan notes found for this encounter.   Past Medical History:  Diagnosis Date  . Allergy   . Asthma   . Cancer [facility])    right breast cancer 2012  . Depression   . Edema   . GERD (gastroesophageal reflux disease)   . Gout   . Hyperlipidemia   . Hypertension     Past Surgical History:  Procedure Laterality Date  . BREAST BIOPSY Bilateral    negative  . BREAST EXCISIONAL BIOPSY Right    right positive 04/2010  . BREAST LUMPECTOMY Right    removed a cancerous lump  . VAGINAL HYSTERECTOMY      Family History  Problem Relation Age of Onset  . Breast cancer Neg Hx     Social History   Social History  . Marital status: Widowed    Spouse name: N/A  . Number of children: N/A  . Years of education: N/A   Occupational History  . Not on file.   Social History Main Topics  . Smoking status: Former Research scientist (life sciences)  . Smokeless tobacco: Not on file  . Alcohol use No  . Drug use: No  . Sexual  activity: Not Currently   Other Topics Concern  . Not on file   Social History Narrative  . No narrative on file    Allergies  Allergen Reactions  . Sulfa Antibiotics      Review of Systems  Constitutional: Negative for chills, fever, malaise/fatigue and weight loss.  HENT: Positive for rhinorrhea. Negative for ear discharge, ear pain, postnasal drip and sore throat.   Eyes: Negative for blurred vision.  Respiratory: Positive for cough. Negative for hemoptysis, sputum production, shortness of breath and wheezing.   Cardiovascular: Positive for chest pain. Negative for palpitations and leg swelling.  Gastrointestinal: Negative for abdominal pain, blood in stool, constipation, diarrhea, heartburn, melena and nausea.  Genitourinary: Negative for dysuria, frequency, hematuria and urgency.  Musculoskeletal: Negative for back pain, joint pain, myalgias and neck pain.  Skin: Negative for rash.  Neurological: Negative for dizziness, tingling, sensory change, focal weakness and headaches.  Endo/Heme/Allergies: Negative for environmental allergies and polydipsia. Does not bruise/bleed easily.  Psychiatric/Behavioral: Negative for depression and suicidal ideas. The patient is not nervous/anxious and does not have insomnia.      Objective  Vitals:   03/24/16 1354 03/24/16 1433  BP: 116/82   Pulse: 68   Resp: 12   Temp: 99.1 F (37.3 C) (!) 101.8 F (38.8 C)  SpO2:  (!) 82%  Weight: 238 lb (108 kg)  Height: 5\' 3"  (1.6 m)     Physical Exam  Constitutional: She is well-developed, well-nourished, and in no distress. No distress.  HENT:  Head: Normocephalic and atraumatic.  Right Ear: Tympanic membrane, external ear and ear canal normal.  Left Ear: Tympanic membrane, external ear and ear canal normal.  Nose: Nose normal.  Mouth/Throat: Oropharynx is clear and moist.  Eyes: Conjunctivae and EOM are normal. Pupils are equal, round, and reactive to light. Right eye exhibits no  discharge. Left eye exhibits no discharge.  Neck: Normal range of motion. Neck supple. No JVD present. No thyromegaly present.  Cardiovascular: Normal rate, regular rhythm, normal heart sounds and intact distal pulses.  Exam reveals no gallop and no friction rub.   No murmur heard. Pulmonary/Chest: Effort normal and breath sounds normal. She has no wheezes. She has no rales.  Abdominal: Soft. Bowel sounds are normal. She exhibits no mass. There is no tenderness. There is no guarding.  Musculoskeletal: Normal range of motion. She exhibits no edema.  Lymphadenopathy:    She has no cervical adenopathy.  Neurological: She is alert.  Skin: Skin is warm and dry. She is not diaphoretic.  Psychiatric: Mood and affect normal.      Assessment & Plan  Problem List Items Addressed This Visit    None    Visit Diagnoses    Bronchitis    -  Primary   Relevant Medications   amoxicillin-clavulanate (AUGMENTIN) 875-125 MG tablet   Other Relevant Orders   POCT Influenza A/B   Pneumonia due to infectious organism, unspecified laterality, unspecified part of lung       referral er   Relevant Medications   amoxicillin-clavulanate (AUGMENTIN) 875-125 MG tablet   albuterol (PROVENTIL) (2.5 MG/3ML) 0.083% nebulizer solution   Dehydration       pt experienced vasovagal/ improved with supine/head elevation        Dr. [facility]  03/24/16

## 2016-03-24 NOTE — ED Triage Notes (Signed)
Pt to ED via EMS from [facility] for possible pneumonia. Pt c/o nasal congestion , cough symptoms x1wk, became febrile today at 101 at clinic. Per EMS pt was tested for flu with negative results. Per EMS pt was initially 86% on RA, received duo neb en route o2 sasts increased to 95% . Pt denies any CP or SOB at this time. Pt states she has lost her voice since yesterday. Pt A&Ox4. VS stable

## 2016-03-24 NOTE — ED Provider Notes (Signed)
[facility] Emergency Department Provider Note  ____________________________________________  Time seen: Approximately 4:42 PM  I have reviewed the triage vital signs and the nursing notes.   HISTORY  Chief Complaint Cough and Fever   HPI Kirsten Snyder is a 82 y.o. female history of breast cancer 2012, asthma, hypertension, and hyperlipidemia who presents from her primary care doctor for cough and fever. Patient has also had body aches. Patient reports 2-3 days of cough productive of yellow sputum. Today she went to see her primary care doctor and had a fever of 101.7F, she had a negative flu swab and he was sent to the emergency room for evaluation of pneumonia. Patient denies wheezing. She does endorse intermittent mild chest pain diffuse only when she coughs. No chest pain when she is not coughing. She reports that she had fever today for the first time. No nausea or vomiting, no diarrhea, no abdominal pain, no dysuria. Patient reports that she has not received a flu shot this season.  Past Medical History:  Diagnosis Date  . Allergy   . Asthma   . Cancer [facility])    right breast cancer 2012  . Depression   . Edema   . GERD (gastroesophageal reflux disease)   . Gout   . Hyperlipidemia   . Hypertension     Patient Active Problem List   Diagnosis Date Noted  . Essential hypertension 12/13/2015  . Breast abscess of female 11/07/2015  . Familial multiple lipoprotein-type hyperlipidemia 10/25/2014  . Clinical depression 10/25/2014  . Acid reflux 10/25/2014  . Acute gastrointestinal bleeding 10/25/2014  . Aggrieved 10/25/2014  . Airway hyperreactivity 10/25/2014  . Arthritis of knee 10/25/2014  . H/O: gout 10/25/2014  . Adiposity 10/25/2014  . Arthritis, degenerative 10/25/2014  . H/O adenomatous polyp of colon 03/07/2014    Past Surgical History:  Procedure Laterality Date  . BREAST BIOPSY Bilateral    negative  . BREAST EXCISIONAL BIOPSY  Right    right positive 04/2010  . BREAST LUMPECTOMY Right    removed a cancerous lump  . VAGINAL HYSTERECTOMY      Prior to Admission medications   Medication Sig Start Date End Date Taking? Authorizing Provider  acetaminophen (TYLENOL) 325 MG tablet Take by mouth.   Yes Historical Provider, MD  allopurinol (ZYLOPRIM) 100 MG tablet Take 1 tablet (100 mg total) by mouth daily. 12/13/15  Yes [name], MD  amoxicillin-clavulanate (AUGMENTIN) 875-125 MG tablet Take 1 tablet by mouth 2 (two) times daily. 03/24/16  Yes [name], MD  anastrozole (ARIMIDEX) 1 MG tablet TAKE (1) TABLET BY MOUTH EVERY DAY 11/14/14  Yes Leia Alf, MD  aspirin 81 MG tablet Take 81 mg by mouth daily.   Yes Historical Provider, MD  Fluticasone-Salmeterol (ADVAIR DISKUS) 100-50 MCG/DOSE AEPB Inhale 1 puff into the lungs 2 (two) times daily. 12/13/15  Yes [name], MD  furosemide (LASIX) 40 MG tablet Take 1 tablet (40 mg total) by mouth daily. Taking 1/2 tablet daily 12/13/15  Yes [name], MD  loratadine (CLARITIN) 10 MG tablet Take 1 tablet (10 mg total) by mouth daily. 12/13/15  Yes [name], MD  losartan (COZAAR) 100 MG tablet Take 1 tablet (100 mg total) by mouth daily. 12/13/15  Yes [name], MD  lovastatin (MEVACOR) 40 MG tablet Take 1 tablet (40 mg total) by mouth at bedtime. 12/13/15  Yes [name], MD  omeprazole (PRILOSEC) 20 MG capsule Take 1  capsule (20 mg total) by mouth daily. 12/13/15  Yes [name], MD  sertraline (ZOLOFT) 50 MG tablet Take 1 tablet (50 mg total) by mouth daily. 12/13/15  Yes [name], MD  albuterol (PROVENTIL) (2.5 MG/3ML) 0.083% nebulizer solution Take 3 mLs (2.5 mg total) by nebulization every 6 (six) hours as needed for wheezing or shortness of breath. 03/24/16   [name], MD  Aspirin-Calcium Carbonate [phone] MG TABS Take by mouth.    Historical Provider, MD  azithromycin (ZITHROMAX) 250 MG tablet Take 1 a day for 4 days 03/24/16    Rudene Re, MD  benzonatate (TESSALON) 100 MG capsule Take 1 capsule (100 mg total) by mouth 2 (two) times daily as needed for cough. Patient not taking: Reported on 03/24/2016 05/20/15   [name], MD  chlorpheniramine-HYDROcodone [facility] PENNKINETIC ER) 10-8 MG/5ML SUER Take 5 mLs by mouth at bedtime as needed for cough. 03/24/16   Rudene Re, MD  guaiFENesin-codeine [facility]) 100-10 MG/5ML syrup Take 5 mLs by mouth 3 (three) times daily as needed for cough. Patient not taking: Reported on 03/24/2016 01/03/16   [name], MD  nystatin cream (MYCOSTATIN) Apply 1 application topically as needed. Patient not taking: Reported on 03/24/2016 05/20/15   [name], MD    Allergies Sulfa antibiotics  Family History  Problem Relation Age of Onset  . Breast cancer Neg Hx     Social History Social History  Substance Use Topics  . Smoking status: Former Research scientist (life sciences)  . Smokeless tobacco: Not on file  . Alcohol use No    Review of Systems  Constitutional: + fever and body aches Eyes: Negative for visual changes. ENT: Negative for sore throat. Neck: No neck pain  Cardiovascular: + chest pain. Respiratory: Negative for shortness of breath. + cough Gastrointestinal: Negative for abdominal pain, vomiting or diarrhea. Genitourinary: Negative for dysuria. Musculoskeletal: Negative for back pain. Skin: Negative for rash. Neurological: Negative for headaches, weakness or numbness. Psych: No SI or HI  ____________________________________________   PHYSICAL EXAM:  VITAL SIGNS: ED Triage Vitals  Enc Vitals Group     BP 03/24/16 1538 134/60     Pulse Rate 03/24/16 1537 86     Resp 03/24/16 1537 18     Temp 03/24/16 1533 (!) 100.4 F (38 C)     Temp Source 03/24/16 1533 Oral     SpO2 03/24/16 1537 97 %     Weight --      Height --      Head Circumference --      Peak Flow --      Pain Score 03/24/16 1533 0     Pain Loc --      Pain Edu? --      Excl. in  [place]? --     Constitutional: Alert and oriented. Well appearing and in no apparent distress. HEENT:      Head: Normocephalic and atraumatic.         Eyes: Conjunctivae are normal. Sclera is non-icteric. EOMI. PERRL      Mouth/Throat: Mucous membranes are moist.       Neck: Supple with no signs of meningismus. Cardiovascular: Regular rate and rhythm. No murmurs, gallops, or rubs. 2+ symmetrical distal pulses are present in all extremities. No JVD. Respiratory: Normal respiratory effort. Lungs are clear to auscultation bilaterally. No wheezes, crackles, or rhonchi.  Gastrointestinal: Soft, non tender, and non distended with positive bowel sounds. No rebound or guarding. Genitourinary: No  CVA tenderness. Musculoskeletal: Nontender with normal range of motion in all extremities. No edema, cyanosis, or erythema of extremities. Neurologic: Normal speech and language. Face is symmetric. Moving all extremities. No gross focal neurologic deficits are appreciated. Skin: Skin is warm, dry and intact. No rash noted. Psychiatric: Mood and affect are normal. Speech and behavior are normal.  ____________________________________________   LABS (all labs ordered are listed, but only abnormal results are displayed)  Labs Reviewed  COMPREHENSIVE METABOLIC PANEL - Abnormal; Notable for the following:       Result Value   Chloride 97 (*)    Glucose, Bld 118 (*)    GFR calc non Af Amer 58 (*)    All other components within normal limits  CBC WITH DIFFERENTIAL/PLATELET - Abnormal; Notable for the following:    WBC 15.0 (*)    RBC 3.70 (*)    HCT 34.6 (*)    Neutro Abs 11.5 (*)    Monocytes Absolute 1.0 (*)    All other components within normal limits  CULTURE, BLOOD (ROUTINE X 2)  CULTURE, BLOOD (ROUTINE X 2)  URINE CULTURE  LACTIC ACID, PLASMA  LACTIC ACID, PLASMA  URINALYSIS, COMPLETE (UACMP) WITH MICROSCOPIC   ____________________________________________  EKG  ED ECG REPORT I, Rudene Re, the attending physician, personally viewed and interpreted this ECG.  Normal sinus rhythm, rate of 87, normal intervals, normal axis, no ST elevations or depressions. ____________________________________________  RADIOLOGY  CXR: No acute disease. ____________________________________________   PROCEDURES  Procedure(s) performed: None Procedures Critical Care performed:  None ____________________________________________   INITIAL IMPRESSION / ASSESSMENT AND PLAN / ED COURSE   82 y.o. female history of breast cancer 2012, asthma, hypertension, and hyperlipidemia who presents from her primary care doctor for cough and fever and body aches. Negative flu at the PCPs office. Patient here has a temp of 100.70F, no tachycardia. Her lungs are clear to auscultation with no crackles or wheezing, moving good air, normal work of breathing with normal sats. Will check blood work and CXR.   Clinical Course as of Mar 25 1919  Tue Mar 24, 2016  1730 Chest x-ray with no evidence of pneumonia however with the fever, productive cough, body aches, and a white count of 15,000. Patient on azithromycin. Patient remains breathing comfortably with no respiratory distress and no wheezing.  [CV]    Clinical Course User Index [CV] Rudene Re, MD    Pertinent labs & imaging results that were available during my care of the patient were reviewed by me and considered in my medical decision making (see chart for details).    ____________________________________________   FINAL CLINICAL IMPRESSION(S) / ED DIAGNOSES  Final diagnoses:  Community acquired pneumonia, unspecified laterality      NEW MEDICATIONS STARTED DURING THIS VISIT:  New Prescriptions   AZITHROMYCIN (ZITHROMAX) 250 MG TABLET    Take 1 a day for 4 days   CHLORPHENIRAMINE-HYDROCODONE (TUSSIONEX PENNKINETIC ER) 10-8 MG/5ML SUER    Take 5 mLs by mouth at bedtime as needed for cough.     Note:  This document was  prepared using Dragon voice recognition software and may include unintentional dictation errors.    Rudene Re, MD 03/26/16 1539

## 2016-03-26 ENCOUNTER — Other Ambulatory Visit: Payer: Self-pay | Admitting: *Deleted

## 2016-03-26 ENCOUNTER — Other Ambulatory Visit: Payer: Self-pay | Admitting: Family Medicine

## 2016-03-26 DIAGNOSIS — R69 Illness, unspecified: Secondary | ICD-10-CM

## 2016-03-26 DIAGNOSIS — J45909 Unspecified asthma, uncomplicated: Secondary | ICD-10-CM

## 2016-03-26 LAB — URINE CULTURE

## 2016-03-26 MED ORDER — ALBUTEROL SULFATE (2.5 MG/3ML) 0.083% IN NEBU: 2.5000 mg | INHALATION_SOLUTION | Freq: Four times a day (QID) | RESPIRATORY_TRACT | 12 refills | Status: DC | PRN

## 2016-03-29 LAB — CULTURE, BLOOD (ROUTINE X 2)
Culture: NO GROWTH
Culture: NO GROWTH

## 2016-04-21 ENCOUNTER — Ambulatory Visit
Admission: RE | Admit: 2016-04-21 | Discharge: 2016-04-21 | Disposition: A | Discharge status: Home / Self Care | Payer: Medicare Other | Source: Ambulatory Visit | Attending: Oncology | Admitting: Oncology

## 2016-04-21 ENCOUNTER — Other Ambulatory Visit: Payer: Self-pay | Admitting: Oncology

## 2016-04-21 DIAGNOSIS — C50919 Malignant neoplasm of unspecified site of unspecified female breast: Secondary | ICD-10-CM

## 2016-04-21 DIAGNOSIS — Z853 Personal history of malignant neoplasm of breast: Secondary | ICD-10-CM

## 2016-04-21 DIAGNOSIS — R922 Inconclusive mammogram: Secondary | ICD-10-CM | POA: Diagnosis not present

## 2016-05-15 DIAGNOSIS — Z23 Encounter for immunization: Secondary | ICD-10-CM | POA: Diagnosis not present

## 2016-05-28 ENCOUNTER — Ambulatory Visit (INDEPENDENT_AMBULATORY_CARE_PROVIDER_SITE_OTHER): Payer: Medicare Other | Admitting: Family Medicine

## 2016-05-28 VITALS — BP 120/80 | HR 88 | Temp 98.2°F | Ht 63.0 in | Wt 229.0 lb

## 2016-05-28 DIAGNOSIS — J40 Bronchitis, not specified as acute or chronic: Secondary | ICD-10-CM | POA: Diagnosis not present

## 2016-05-28 DIAGNOSIS — B379 Candidiasis, unspecified: Secondary | ICD-10-CM

## 2016-05-28 DIAGNOSIS — J4520 Mild intermittent asthma, uncomplicated: Secondary | ICD-10-CM | POA: Diagnosis not present

## 2016-05-28 DIAGNOSIS — B372 Candidiasis of skin and nail: Secondary | ICD-10-CM

## 2016-05-28 MED ORDER — ALBUTEROL SULFATE HFA 108 (90 BASE) MCG/ACT IN AERS: 2.0000 | INHALATION_SPRAY | Freq: Four times a day (QID) | RESPIRATORY_TRACT | 0 refills | Status: DC | PRN

## 2016-05-28 MED ORDER — NYSTATIN 100000 UNIT/GM EX CREA: 1.0000 "application " | TOPICAL_CREAM | CUTANEOUS | 11 refills | Status: DC | PRN

## 2016-05-28 MED ORDER — DOXYCYCLINE HYCLATE 100 MG PO TABS: 100.0000 mg | ORAL_TABLET | Freq: Two times a day (BID) | ORAL | 0 refills | Status: DC

## 2016-05-28 NOTE — Progress Notes (Signed)
Name: Kirsten Snyder   MRN: [id number]    DOB: [birth year]   Date:05/28/2016       Progress Note  Subjective  Chief Complaint  Chief Complaint  Patient presents with  . Cough    cong    Cough  This is a new problem. The current episode started in the past 7 days. The problem has been gradually worsening. The cough is non-productive. Associated symptoms include chills, nasal congestion, postnasal drip, rhinorrhea and wheezing. Pertinent negatives include no chest pain, ear congestion, ear pain, fever, headaches, heartburn, hemoptysis, myalgias, rash, sore throat, shortness of breath, sweats or weight loss. The symptoms are aggravated by cold air. She has tried nothing for the symptoms. The treatment provided mild relief. There is no history of environmental allergies.  Rash  This is a new problem. The current episode started in the past 7 days. The problem is unchanged. Location: beneath breast/pannus. The rash is characterized by redness (odor). She was exposed to nothing. Associated symptoms include coughing and rhinorrhea. Pertinent negatives include no diarrhea, facial edema, fatigue, fever, joint pain, shortness of breath or sore throat.    No problem-specific Assessment & Plan notes found for this encounter.   Past Medical History:  Diagnosis Date  . Allergy   . Asthma   . Cancer [facility]) 2012   right breast cancer lumpectomy with rad tx  . Depression   . Edema   . GERD (gastroesophageal reflux disease)   . Gout   . Hyperlipidemia   . Hypertension     Past Surgical History:  Procedure Laterality Date  . BREAST BIOPSY Bilateral    negative  . BREAST EXCISIONAL BIOPSY Right    right positive 04/2010  . BREAST LUMPECTOMY Right    removed a cancerous lump  . VAGINAL HYSTERECTOMY      Family History  Problem Relation Age of Onset  . Breast cancer Neg Hx     Social History   Social History  . Marital status: Widowed    Spouse name: N/A  . Number of children: N/A  .  Years of education: N/A   Occupational History  . Not on file.   Social History Main Topics  . Smoking status: Former Research scientist (life sciences)  . Smokeless tobacco: Not on file  . Alcohol use No  . Drug use: No  . Sexual activity: Not Currently   Other Topics Concern  . Not on file   Social History Narrative  . No narrative on file    Allergies  Allergen Reactions  . Sulfa Antibiotics      Review of Systems  Constitutional: Positive for chills. Negative for fatigue, fever, malaise/fatigue and weight loss.  HENT: Positive for postnasal drip and rhinorrhea. Negative for ear discharge, ear pain and sore throat.   Eyes: Negative for blurred vision.  Respiratory: Positive for cough and wheezing. Negative for hemoptysis, sputum production and shortness of breath.   Cardiovascular: Negative for chest pain, palpitations and leg swelling.  Gastrointestinal: Negative for abdominal pain, blood in stool, constipation, diarrhea, heartburn, melena and nausea.  Genitourinary: Negative for dysuria, frequency, hematuria and urgency.  Musculoskeletal: Negative for back pain, joint pain, myalgias and neck pain.  Skin: Negative for rash.  Neurological: Negative for dizziness, tingling, sensory change, focal weakness and headaches.  Endo/Heme/Allergies: Negative for environmental allergies and polydipsia. Does not bruise/bleed easily.  Psychiatric/Behavioral: Negative for depression and suicidal ideas. The patient is not nervous/anxious and does not have insomnia.      Objective  Vitals:   05/28/16 1432  BP: 120/80  Pulse: 88  Temp: 98.2 F (36.8 C)  TempSrc: Oral  SpO2: 97%  Weight: 229 lb (103.9 kg)  Height: 5\' 3"  (1.6 m)    Physical Exam  Constitutional: She is well-developed, well-nourished, and in no distress. No distress.  HENT:  Head: Normocephalic and atraumatic.  Right Ear: Tympanic membrane, external ear and ear canal normal.  Left Ear: Tympanic membrane, external ear and ear canal  normal.  Nose: Nose normal.  Mouth/Throat: Oropharynx is clear and moist.  Eyes: Conjunctivae and EOM are normal. Pupils are equal, round, and reactive to light. Right eye exhibits no discharge. Left eye exhibits no discharge.  Neck: Normal range of motion. Neck supple. No JVD present. No thyromegaly present.  Cardiovascular: Normal rate, regular rhythm, normal heart sounds and intact distal pulses.  Exam reveals no gallop and no friction rub.   No murmur heard. Pulmonary/Chest: Effort normal and breath sounds normal. No respiratory distress. She has no wheezes. She has no rales.  Abdominal: Soft. Bowel sounds are normal. She exhibits no mass. There is no tenderness. There is no rebound and no guarding.  Musculoskeletal: Normal range of motion. She exhibits no edema.  Lymphadenopathy:    She has no cervical adenopathy.  Neurological: She is alert. She has normal reflexes.  Skin: Skin is warm and dry. She is not diaphoretic.  Psychiatric: Mood and affect normal.  Nursing note and vitals reviewed.     Assessment & Plan  Problem List Items Addressed This Visit      Respiratory   Airway hyperreactivity - Primary   Relevant Medications   albuterol (PROVENTIL HFA;VENTOLIN HFA) 108 (90 Base) MCG/ACT inhaler    Other Visit Diagnoses    Bronchitis       Relevant Medications   doxycycline (VIBRA-TABS) 100 MG tablet   Candidiasis       Relevant Medications   nystatin cream (MYCOSTATIN)   Yeast infection of the skin       Relevant Medications   nystatin cream (MYCOSTATIN)        Dr. Shalee Paolo [facility]  05/28/16

## 2016-06-12 ENCOUNTER — Ambulatory Visit: Payer: Medicare Other | Admitting: Oncology

## 2016-06-17 ENCOUNTER — Other Ambulatory Visit: Payer: Self-pay | Admitting: Surgery

## 2016-06-17 DIAGNOSIS — Z853 Personal history of malignant neoplasm of breast: Secondary | ICD-10-CM | POA: Diagnosis not present

## 2016-06-17 DIAGNOSIS — N63 Unspecified lump in unspecified breast: Secondary | ICD-10-CM

## 2016-06-17 DIAGNOSIS — C50911 Malignant neoplasm of unspecified site of right female breast: Secondary | ICD-10-CM | POA: Insufficient documentation

## 2016-06-17 NOTE — Progress Notes (Signed)
[place]  [phone]  Fax:([phone]) [place] DOB: [birth year]  MR#: [id number]  HYI#:[id number]  Patient Care Team: [name], MD as PCP - General (Family Medicine)  CHIEF COMPLAINT:  ER/PR positive, HER-2 negative malignant neoplasm of the right breast, unspecified site.  INTERVAL HISTORY: Patient returns to clinic today for routine 6 month evaluation. She noted a right breast lump and was evaluated by surgery earlier this week. She is highly anxious, but otherwise feels well. She has no neurologic complaints. She denies any recent fevers or illnesses. She has a good appetite and denies weight loss. She denies any chest pain or shortness of breath. She has no nausea, vomiting, constipation, or diarrhea. She has no urinary complaints. Patient otherwise feels well and offers no further specific complaints.  REVIEW OF SYSTEMS:   Review of Systems  Constitutional: Negative.  Negative for fever, malaise/fatigue and weight loss.  HENT: Negative.   Respiratory: Negative.  Negative for cough and shortness of breath.   Cardiovascular: Negative.  Negative for chest pain and leg swelling.  Gastrointestinal: Negative.  Negative for abdominal pain.  Musculoskeletal: Negative.   Neurological: Negative.  Negative for sensory change and weakness.  Psychiatric/Behavioral: The patient is nervous/anxious.     As per HPI. Otherwise, a complete review of systems is negative.  ONCOLOGY HISTORY:  No history exists.    PAST MEDICAL HISTORY: Past Medical History:  Diagnosis Date  . Allergy   . Asthma   . Cancer [facility]) 2012   right breast cancer lumpectomy with rad tx  . Depression   . Edema   . GERD (gastroesophageal reflux disease)   . Gout   . Hyperlipidemia   . Hypertension     PAST SURGICAL HISTORY: Past Surgical History:  Procedure Laterality Date  . BREAST BIOPSY Bilateral    negative  . BREAST EXCISIONAL BIOPSY Right    right  positive 04/2010  . BREAST LUMPECTOMY Right    removed a cancerous lump  . VAGINAL HYSTERECTOMY      FAMILY HISTORY Family History  Problem Relation Age of Onset  . Breast cancer Neg Hx     GYNECOLOGIC HISTORY:  No LMP recorded. Patient has had a hysterectomy.     ADVANCED DIRECTIVES:    HEALTH MAINTENANCE: Social History  Substance Use Topics  . Smoking status: Former Research scientist (life sciences)  . Smokeless tobacco: Not on file  . Alcohol use No    Allergies  Allergen Reactions  . Sulfa Antibiotics     Current Outpatient Prescriptions  Medication Sig Dispense Refill  . acetaminophen (TYLENOL) 325 MG tablet Take by mouth.    Marland Kitchen albuterol (PROVENTIL HFA;VENTOLIN HFA) 108 (90 Base) MCG/ACT inhaler Inhale 2 puffs into the lungs every 6 (six) hours as needed for wheezing or shortness of breath. 1 Inhaler 0  . albuterol (PROVENTIL) (2.5 MG/3ML) 0.083% nebulizer solution Take 3 mLs (2.5 mg total) by nebulization every 6 (six) hours as needed for wheezing or shortness of breath. 75 mL 12  . allopurinol (ZYLOPRIM) 100 MG tablet Take 1 tablet (100 mg total) by mouth daily. 90 tablet 1  . aspirin 81 MG tablet Take 81 mg by mouth daily.    . Aspirin-Calcium Carbonate 81-777 MG TABS Take by mouth.    . doxycycline (VIBRA-TABS) 100 MG tablet Take 1 tablet (100 mg total) by mouth 2 (two) times daily. 20 tablet 0  . Fluticasone-Salmeterol (ADVAIR DISKUS) 100-50 MCG/DOSE AEPB Inhale 1 puff into  the lungs 2 (two) times daily. 3 each 1  . furosemide (LASIX) 40 MG tablet Take 1 tablet (40 mg total) by mouth daily. Taking 1/2 tablet daily 90 tablet 1  . loratadine (CLARITIN) 10 MG tablet Take 1 tablet (10 mg total) by mouth daily. 90 tablet 1  . losartan (COZAAR) 100 MG tablet Take 1 tablet (100 mg total) by mouth daily. 90 tablet 1  . lovastatin (MEVACOR) 40 MG tablet Take 1 tablet (40 mg total) by mouth at bedtime. 90 tablet 1  . nystatin cream (MYCOSTATIN) Apply 1 application topically as needed. 30 g 11  .  omeprazole (PRILOSEC) 20 MG capsule Take 1 capsule (20 mg total) by mouth daily. 90 capsule 1  . sertraline (ZOLOFT) 50 MG tablet Take 1 tablet (50 mg total) by mouth daily. 90 tablet 1   No current facility-administered medications for this visit.     OBJECTIVE: BP (!) 159/83 (BP Location: Left Arm, Patient Position: Sitting)   Pulse 86   Temp 98.7 F (37.1 C) (Tympanic)   Resp 18   Wt 233 lb 7.5 oz (105.9 kg)   BMI 41.36 kg/m    Body mass index is 41.36 kg/m.    ECOG FS:0 - Asymptomatic  General: Well-developed, well-nourished, no acute distress. Eyes: Pink conjunctiva, anicteric sclera. Breasts: Right breast with 1 cm fibrous mass palpated in lower outer quadrant. Lungs: Clear to auscultation bilaterally. Heart: Regular rate and rhythm. No rubs, murmurs, or gallops. Abdomen: Soft, nontender, nondistended. No organomegaly noted, normoactive bowel sounds. Musculoskeletal: No edema, cyanosis, or clubbing. Neuro: Alert, answering all questions appropriately. Cranial nerves grossly intact. Skin: No rashes or petechiae noted. Psych: Normal affect.   LAB RESULTS:  No visits with results within 3 Day(s) from this visit.  Latest known visit with results is:  Admission on 03/24/2016, Discharged on 03/24/2016  Component Date Value Ref Range Status  . Sodium 03/24/2016 135  135 - 145 mmol/L Final  . Potassium 03/24/2016 3.7  3.5 - 5.1 mmol/L Final  . Chloride 03/24/2016 97* 101 - 111 mmol/L Final  . CO2 03/24/2016 29  22 - 32 mmol/L Final  . Glucose, Bld 03/24/2016 118* 65 - 99 mg/dL Final  . BUN 03/24/2016 12  6 - 20 mg/dL Final  . Creatinine, Ser 03/24/2016 0.91  0.44 - 1.00 mg/dL Final  . Calcium 03/24/2016 8.9  8.9 - 10.3 mg/dL Final  . Total Protein 03/24/2016 7.7  6.5 - 8.1 g/dL Final  . Albumin 03/24/2016 3.8  3.5 - 5.0 g/dL Final  . AST 03/24/2016 25  15 - 41 U/L Final  . ALT 03/24/2016 21  14 - 54 U/L Final  . Alkaline Phosphatase 03/24/2016 118  38 - 126 U/L Final  .  Total Bilirubin 03/24/2016 1.1  0.3 - 1.2 mg/dL Final  . GFR calc non Af Amer 03/24/2016 58* >60 mL/min Final  . GFR calc Af Amer 03/24/2016 >60  >60 mL/min Final   Comment: (NOTE) The eGFR has been calculated using the CKD EPI equation. This calculation has not been validated in all clinical situations. eGFR's persistently <60 mL/min signify possible Chronic Kidney Disease.   . Anion gap 03/24/2016 9  5 - 15 Final  . WBC 03/24/2016 15.0* 3.6 - 11.0 K/uL Final  . RBC 03/24/2016 3.70* 3.80 - 5.20 MIL/uL Final  . Hemoglobin 03/24/2016 12.0  12.0 - 16.0 g/dL Final  . HCT 03/24/2016 34.6* 35.0 - 47.0 % Final  . MCV 03/24/2016 93.5  80.0 -  100.0 fL Final  . MCH 03/24/2016 32.3  26.0 - 34.0 pg Final  . MCHC 03/24/2016 34.6  32.0 - 36.0 g/dL Final  . RDW 03/24/2016 13.5  11.5 - 14.5 % Final  . Platelets 03/24/2016 345  150 - 440 K/uL Final  . Neutrophils Relative % 03/24/2016 78  % Final  . Neutro Abs 03/24/2016 11.5* 1.4 - 6.5 K/uL Final  . Lymphocytes Relative 03/24/2016 14  % Final  . Lymphs Abs 03/24/2016 2.1  1.0 - 3.6 K/uL Final  . Monocytes Relative 03/24/2016 6  % Final  . Monocytes Absolute 03/24/2016 1.0* 0.2 - 0.9 K/uL Final  . Eosinophils Relative 03/24/2016 2  % Final  . Eosinophils Absolute 03/24/2016 0.3  0 - 0.7 K/uL Final  . Basophils Relative 03/24/2016 0  % Final  . Basophils Absolute 03/24/2016 0.0  0 - 0.1 K/uL Final  . Specimen Description 03/24/2016 BLOOD RIGHT AC   Final  . Special Requests 03/24/2016 BOTTLES DRAWN AEROBIC AND ANAEROBIC ANA13CC [id number]    Final  . Culture 03/24/2016 NO GROWTH 5 DAYS   Final  . Report Status 03/24/2016 03/29/2016 FINAL   Final  . Specimen Description 03/24/2016 BLOOD RIGHT AC   Final  . Special Requests 03/24/2016 BOTTLES DRAWN AEROBIC AND ANAEROBIC ANA5CC [place]   Final  . Culture 03/24/2016 NO GROWTH 5 DAYS   Final  . Report Status 03/24/2016 03/29/2016 FINAL   Final  . Specimen Description 03/24/2016 URINE, RANDOM   Final  .  Special Requests 03/24/2016 NONE   Final  . Culture 03/24/2016 MULTIPLE SPECIES PRESENT, SUGGEST RECOLLECTION*  Final  . Report Status 03/24/2016 03/26/2016 FINAL   Final  . Lactic Acid, Venous 03/24/2016 1.0  0.5 - 1.9 mmol/L Final  . Color, Urine 03/24/2016 AMBER* YELLOW Final  . APPearance 03/24/2016 HAZY* CLEAR Final  . Specific Gravity, Urine 03/24/2016 1.017  1.005 - 1.030 Final  . pH 03/24/2016 6.0  5.0 - 8.0 Final  . Glucose, UA 03/24/2016 NEGATIVE  NEGATIVE mg/dL Final  . Hgb urine dipstick 03/24/2016 NEGATIVE  NEGATIVE Final  . Bilirubin Urine 03/24/2016 NEGATIVE  NEGATIVE Final  . Ketones, ur 03/24/2016 NEGATIVE  NEGATIVE mg/dL Final  . Protein, ur 03/24/2016 30* NEGATIVE mg/dL Final  . Nitrite 03/24/2016 NEGATIVE  NEGATIVE Final  . Leukocytes, UA 03/24/2016 LARGE* NEGATIVE Final  . RBC / HPF 03/24/2016 0-5  0 - 5 RBC/hpf Final  . WBC, UA 03/24/2016 TOO NUMEROUS TO COUNT  0 - 5 WBC/hpf Final  . Bacteria, UA 03/24/2016 NONE SEEN  NONE SEEN Final  . Squamous Epithelial / LPF 03/24/2016 0-5* NONE SEEN Final  . Mucous 03/24/2016 PRESENT   Final    STUDIES: No results found.  ASSESSMENT: ER/PR positive, HER-2 negative malignant neoplasm of the right breast, unspecified site.  PLAN:   1. ER/PR positive, HER-2 negative malignant neoplasm of the right breast, unspecified site: Patient was noted to be a pathologic stage IA (T1b,N0,M0). Oncotype DX score was low risk and 14. She is status post wide local excision and sentinel node biopsy. Patient completed 5 years of anastrozole in March 2017. Her most recent mammogram on April 21, 2016 was reported as BI-RADS 2. Return to clinic in 1 year for further evaluation.  2. Breast lump: Patient was evaluated by surgery yesterday. She has an ultrasound and possible biopsy scheduled for next 1-2 weeks.  3. Osteopenia: Continue calcium and vitamin D. Bone mineral densities are monitored by primary care.   Patient expressed understanding  and  was in agreement with this plan. She also understands that She can call clinic at any time with any questions, concerns, or complaints.    Lloyd Huger, MD   06/19/2016 10:48 AM

## 2016-06-19 ENCOUNTER — Inpatient Hospital Stay: Payer: Medicare Other | Attending: Oncology | Admitting: Oncology

## 2016-06-19 ENCOUNTER — Other Ambulatory Visit: Payer: Self-pay | Admitting: Surgery

## 2016-06-19 DIAGNOSIS — Z853 Personal history of malignant neoplasm of breast: Secondary | ICD-10-CM | POA: Diagnosis not present

## 2016-06-19 DIAGNOSIS — M109 Gout, unspecified: Secondary | ICD-10-CM | POA: Diagnosis not present

## 2016-06-19 DIAGNOSIS — Z7951 Long term (current) use of inhaled steroids: Secondary | ICD-10-CM | POA: Insufficient documentation

## 2016-06-19 DIAGNOSIS — I10 Essential (primary) hypertension: Secondary | ICD-10-CM | POA: Diagnosis not present

## 2016-06-19 DIAGNOSIS — J45909 Unspecified asthma, uncomplicated: Secondary | ICD-10-CM | POA: Diagnosis not present

## 2016-06-19 DIAGNOSIS — F419 Anxiety disorder, unspecified: Secondary | ICD-10-CM | POA: Diagnosis not present

## 2016-06-19 DIAGNOSIS — Z79899 Other long term (current) drug therapy: Secondary | ICD-10-CM | POA: Insufficient documentation

## 2016-06-19 DIAGNOSIS — Z923 Personal history of irradiation: Secondary | ICD-10-CM | POA: Diagnosis not present

## 2016-06-19 DIAGNOSIS — N6310 Unspecified lump in the right breast, unspecified quadrant: Secondary | ICD-10-CM

## 2016-06-19 DIAGNOSIS — Z7982 Long term (current) use of aspirin: Secondary | ICD-10-CM | POA: Diagnosis not present

## 2016-06-19 DIAGNOSIS — R609 Edema, unspecified: Secondary | ICD-10-CM | POA: Diagnosis not present

## 2016-06-19 DIAGNOSIS — M8580 Other specified disorders of bone density and structure, unspecified site: Secondary | ICD-10-CM

## 2016-06-19 DIAGNOSIS — E785 Hyperlipidemia, unspecified: Secondary | ICD-10-CM | POA: Diagnosis not present

## 2016-06-19 DIAGNOSIS — F329 Major depressive disorder, single episode, unspecified: Secondary | ICD-10-CM | POA: Insufficient documentation

## 2016-06-19 DIAGNOSIS — Z170 Estrogen receptor positive status [ER+]: Secondary | ICD-10-CM

## 2016-06-19 DIAGNOSIS — K219 Gastro-esophageal reflux disease without esophagitis: Secondary | ICD-10-CM | POA: Diagnosis not present

## 2016-06-19 DIAGNOSIS — Z87891 Personal history of nicotine dependence: Secondary | ICD-10-CM

## 2016-06-19 DIAGNOSIS — N63 Unspecified lump in unspecified breast: Secondary | ICD-10-CM

## 2016-06-19 DIAGNOSIS — C50911 Malignant neoplasm of unspecified site of right female breast: Secondary | ICD-10-CM

## 2016-06-19 NOTE — Progress Notes (Signed)
States is feeling very anxious today due to Dr. Tamala Julian finding lump in right breast. Pt states is waiting for an appt to have an ultrasound performed on her right breast. Had breast exam performed by Dr. Tamala Julian on 2/28 but requests to have another breast exam today for further evaluation for right breast lump.

## 2016-06-25 ENCOUNTER — Ambulatory Visit (INDEPENDENT_AMBULATORY_CARE_PROVIDER_SITE_OTHER): Payer: Medicare Other | Admitting: Family Medicine

## 2016-06-25 ENCOUNTER — Encounter: Payer: Self-pay | Admitting: Family Medicine

## 2016-06-25 VITALS — BP 110/80 | HR 88 | Ht 63.0 in | Wt 232.0 lb

## 2016-06-25 DIAGNOSIS — J301 Allergic rhinitis due to pollen: Secondary | ICD-10-CM

## 2016-06-25 DIAGNOSIS — J4520 Mild intermittent asthma, uncomplicated: Secondary | ICD-10-CM

## 2016-06-25 DIAGNOSIS — E782 Mixed hyperlipidemia: Secondary | ICD-10-CM | POA: Diagnosis not present

## 2016-06-25 MED ORDER — FLUTICASONE-SALMETEROL 100-50 MCG/DOSE IN AEPB: 1.0000 | INHALATION_SPRAY | Freq: Two times a day (BID) | RESPIRATORY_TRACT | 1 refills | Status: DC

## 2016-06-25 MED ORDER — ALBUTEROL SULFATE HFA 108 (90 BASE) MCG/ACT IN AERS: 2.0000 | INHALATION_SPRAY | Freq: Four times a day (QID) | RESPIRATORY_TRACT | 0 refills | Status: DC | PRN

## 2016-06-25 NOTE — Progress Notes (Signed)
Name: Kirsten Snyder   MRN: [id number]    DOB: 1933-05-02   Date:06/25/2016       Progress Note  Subjective  Chief Complaint  Chief Complaint  Patient presents with  . Follow-up    had a round of Doxycycline- feeling "alot better"    Asthma  There is no chest tightness, cough, difficulty breathing, frequent throat clearing, hemoptysis, hoarse voice, shortness of breath, sputum production or wheezing. This is a chronic problem. The problem occurs intermittently. The problem has been gradually improving. Pertinent negatives include no appetite change, chest pain, dyspnea on exertion, ear congestion, ear pain, fever, headaches, heartburn, malaise/fatigue, myalgias, nasal congestion, orthopnea, PND, postnasal drip, rhinorrhea, sneezing, sore throat, sweats, trouble swallowing or weight loss. Her symptoms are aggravated by change in weather. Her symptoms are alleviated by beta-agonist (doxycycline). She reports moderate improvement on treatment. Her past medical history is significant for asthma and bronchitis. There is no history of bronchiectasis, COPD, emphysema or pneumonia.    No problem-specific Assessment & Plan notes found for this encounter.   Past Medical History:  Diagnosis Date  . Allergy   . Asthma   . Cancer [facility]) 2012   right breast cancer lumpectomy with rad tx  . Depression   . Edema   . GERD (gastroesophageal reflux disease)   . Gout   . Hyperlipidemia   . Hypertension     Past Surgical History:  Procedure Laterality Date  . BREAST BIOPSY Bilateral    negative  . BREAST EXCISIONAL BIOPSY Right    right positive 04/2010  . BREAST LUMPECTOMY Right    removed a cancerous lump  . VAGINAL HYSTERECTOMY      Family History  Problem Relation Age of Onset  . Breast cancer Neg Hx     Social History   Social History  . Marital status: Widowed    Spouse name: N/A  . Number of children: N/A  . Years of education: N/A   Occupational History  . Not on file.    Social History Main Topics  . Smoking status: Former Research scientist (life sciences)  . Smokeless tobacco: Never Used  . Alcohol use No  . Drug use: No  . Sexual activity: Not Currently   Other Topics Concern  . Not on file   Social History Narrative  . No narrative on file    Allergies  Allergen Reactions  . Sulfa Antibiotics     Outpatient Medications Prior to Visit  Medication Sig Dispense Refill  . acetaminophen (TYLENOL) 325 MG tablet Take by mouth.    Marland Kitchen albuterol (PROVENTIL) (2.5 MG/3ML) 0.083% nebulizer solution Take 3 mLs (2.5 mg total) by nebulization every 6 (six) hours as needed for wheezing or shortness of breath. 75 mL 12  . allopurinol (ZYLOPRIM) 100 MG tablet Take 1 tablet (100 mg total) by mouth daily. 90 tablet 1  . aspirin 81 MG tablet Take 81 mg by mouth daily.    . furosemide (LASIX) 40 MG tablet Take 1 tablet (40 mg total) by mouth daily. Taking 1/2 tablet daily 90 tablet 1  . loratadine (CLARITIN) 10 MG tablet Take 1 tablet (10 mg total) by mouth daily. 90 tablet 1  . losartan (COZAAR) 100 MG tablet Take 1 tablet (100 mg total) by mouth daily. 90 tablet 1  . lovastatin (MEVACOR) 40 MG tablet Take 1 tablet (40 mg total) by mouth at bedtime. 90 tablet 1  . nystatin cream (MYCOSTATIN) Apply 1 application topically as needed. 30 g 11  .  omeprazole (PRILOSEC) 20 MG capsule Take 1 capsule (20 mg total) by mouth daily. 90 capsule 1  . sertraline (ZOLOFT) 50 MG tablet Take 1 tablet (50 mg total) by mouth daily. 90 tablet 1  . albuterol (PROVENTIL HFA;VENTOLIN HFA) 108 (90 Base) MCG/ACT inhaler Inhale 2 puffs into the lungs every 6 (six) hours as needed for wheezing or shortness of breath. 1 Inhaler 0  . Aspirin-Calcium Carbonate 81-777 MG TABS Take by mouth.    . Fluticasone-Salmeterol (ADVAIR DISKUS) 100-50 MCG/DOSE AEPB Inhale 1 puff into the lungs 2 (two) times daily. 3 each 1  . doxycycline (VIBRA-TABS) 100 MG tablet Take 1 tablet (100 mg total) by mouth 2 (two) times daily. 20 tablet  0   No facility-administered medications prior to visit.     Review of Systems  Constitutional: Negative for appetite change, chills, fever, malaise/fatigue and weight loss.  HENT: Negative for ear discharge, ear pain, hoarse voice, postnasal drip, rhinorrhea, sneezing, sore throat and trouble swallowing.   Eyes: Negative for blurred vision.  Respiratory: Negative for cough, hemoptysis, sputum production, shortness of breath and wheezing.   Cardiovascular: Negative for chest pain, dyspnea on exertion, palpitations, leg swelling and PND.  Gastrointestinal: Negative for abdominal pain, blood in stool, constipation, diarrhea, heartburn, melena and nausea.  Genitourinary: Negative for dysuria, frequency, hematuria and urgency.  Musculoskeletal: Negative for back pain, joint pain, myalgias and neck pain.  Skin: Negative for rash.  Neurological: Negative for dizziness, tingling, sensory change, focal weakness and headaches.  Endo/Heme/Allergies: Negative for environmental allergies and polydipsia. Does not bruise/bleed easily.  Psychiatric/Behavioral: Negative for depression and suicidal ideas. The patient is not nervous/anxious and does not have insomnia.      Objective  Vitals:   06/25/16 1334  BP: 110/80  Pulse: 88  Weight: 232 lb (105.2 kg)  Height: 5\' 3"  (1.6 m)    Physical Exam  Constitutional: She is well-developed, well-nourished, and in no distress. No distress.  HENT:  Head: Normocephalic and atraumatic.  Right Ear: External ear normal.  Left Ear: External ear normal.  Nose: Nose normal.  Mouth/Throat: Oropharynx is clear and moist.  Eyes: Conjunctivae and EOM are normal. Pupils are equal, round, and reactive to light. Right eye exhibits no discharge. Left eye exhibits no discharge.  Neck: Normal range of motion. Neck supple. No JVD present. No thyromegaly present.  Cardiovascular: Normal rate, regular rhythm, normal heart sounds and intact distal pulses.  Exam reveals no  gallop and no friction rub.   No murmur heard. Pulmonary/Chest: Effort normal. No respiratory distress. She has no wheezes. She has no rales.  Abdominal: Soft. Bowel sounds are normal. She exhibits no mass. There is no tenderness. There is no guarding.  Musculoskeletal: Normal range of motion. She exhibits no edema.  Lymphadenopathy:    She has no cervical adenopathy.  Neurological: She is alert.  Skin: Skin is warm and dry. She is not diaphoretic.  Psychiatric: Mood and affect normal.      Assessment & Plan  Problem List Items Addressed This Visit      Respiratory   Airway hyperreactivity - Primary   Relevant Medications   albuterol (PROVENTIL HFA;VENTOLIN HFA) 108 (90 Base) MCG/ACT inhaler   Fluticasone-Salmeterol (ADVAIR DISKUS) 100-50 MCG/DOSE AEPB    Other Visit Diagnoses    Chronic seasonal allergic rhinitis due to pollen       Mixed hyperlipidemia       Relevant Orders   Lipid Profile      Meds ordered  this encounter  Medications  . albuterol (PROVENTIL HFA;VENTOLIN HFA) 108 (90 Base) MCG/ACT inhaler    Sig: Inhale 2 puffs into the lungs every 6 (six) hours as needed for wheezing or shortness of breath.    Dispense:  1 Inhaler    Refill:  0  . Fluticasone-Salmeterol (ADVAIR DISKUS) 100-50 MCG/DOSE AEPB    Sig: Inhale 1 puff into the lungs 2 (two) times daily.    Dispense:  3 each    Refill:  1      Dr. Macon [facility]  06/25/16

## 2016-06-26 LAB — LIPID PANEL
CHOLESTEROL TOTAL: 197 mg/dL (ref 100–199)
Chol/HDL Ratio: 4.8 ratio units — ABNORMAL HIGH (ref 0.0–4.4)
HDL: 41 mg/dL (ref >39)
LDL Calculated: 103 mg/dL — ABNORMAL HIGH (ref 0–99)
Triglycerides: 263 mg/dL — ABNORMAL HIGH (ref 0–149)
VLDL CHOLESTEROL CAL: 53 mg/dL — AB (ref 5–40)

## 2016-07-01 ENCOUNTER — Ambulatory Visit
Admission: RE | Admit: 2016-07-01 | Discharge: 2016-07-01 | Disposition: A | Discharge status: Home / Self Care | Payer: Medicare Other | Source: Ambulatory Visit | Attending: Surgery | Admitting: Surgery

## 2016-07-01 ENCOUNTER — Ambulatory Visit
Admission: RE | Admit: 2016-07-01 | Discharge: 2016-07-01 | Disposition: A | Discharge status: Home / Self Care | Payer: Medicare Other | Source: Ambulatory Visit | Attending: Oncology | Admitting: Oncology

## 2016-07-01 DIAGNOSIS — N63 Unspecified lump in unspecified breast: Secondary | ICD-10-CM | POA: Diagnosis present

## 2016-07-01 DIAGNOSIS — N6001 Solitary cyst of right breast: Secondary | ICD-10-CM | POA: Diagnosis not present

## 2016-07-01 DIAGNOSIS — C50911 Malignant neoplasm of unspecified site of right female breast: Secondary | ICD-10-CM | POA: Diagnosis not present

## 2016-07-01 DIAGNOSIS — Z170 Estrogen receptor positive status [ER+]: Secondary | ICD-10-CM | POA: Insufficient documentation

## 2016-07-01 DIAGNOSIS — N6311 Unspecified lump in the right breast, upper outer quadrant: Secondary | ICD-10-CM | POA: Diagnosis not present

## 2016-07-01 DIAGNOSIS — N6313 Unspecified lump in the right breast, lower outer quadrant: Secondary | ICD-10-CM | POA: Diagnosis not present

## 2016-09-10 ENCOUNTER — Other Ambulatory Visit: Payer: Self-pay | Admitting: Family Medicine

## 2016-09-10 DIAGNOSIS — K219 Gastro-esophageal reflux disease without esophagitis: Secondary | ICD-10-CM

## 2016-10-16 ENCOUNTER — Other Ambulatory Visit: Payer: Self-pay | Admitting: Family Medicine

## 2016-10-16 DIAGNOSIS — F329 Major depressive disorder, single episode, unspecified: Secondary | ICD-10-CM

## 2016-10-16 DIAGNOSIS — F32A Depression, unspecified: Secondary | ICD-10-CM

## 2016-10-16 DIAGNOSIS — E790 Hyperuricemia without signs of inflammatory arthritis and tophaceous disease: Secondary | ICD-10-CM

## 2016-10-30 ENCOUNTER — Other Ambulatory Visit: Payer: Self-pay | Admitting: Family Medicine

## 2016-10-30 DIAGNOSIS — I10 Essential (primary) hypertension: Secondary | ICD-10-CM

## 2016-11-30 ENCOUNTER — Ambulatory Visit (INDEPENDENT_AMBULATORY_CARE_PROVIDER_SITE_OTHER): Payer: Medicare Other | Admitting: Family Medicine

## 2016-11-30 ENCOUNTER — Encounter: Payer: Self-pay | Admitting: Family Medicine

## 2016-11-30 VITALS — BP 110/60 | HR 84 | Ht 63.0 in | Wt 237.0 lb

## 2016-11-30 DIAGNOSIS — J40 Bronchitis, not specified as acute or chronic: Secondary | ICD-10-CM | POA: Diagnosis not present

## 2016-11-30 MED ORDER — GUAIFENESIN-CODEINE 100-10 MG/5ML PO SYRP: 5.0000 mL | ORAL_SOLUTION | Freq: Three times a day (TID) | ORAL | 0 refills | Status: DC | PRN

## 2016-11-30 MED ORDER — LEVOFLOXACIN 500 MG PO TABS: 500.0000 mg | ORAL_TABLET | Freq: Every day | ORAL | 0 refills | Status: DC

## 2016-11-30 NOTE — Progress Notes (Signed)
Name: Kirsten Snyder   MRN: [id number]    DOB: Jan 13, 1934   Date:11/30/2016       Progress Note  Subjective  Chief Complaint  Chief Complaint  Patient presents with  . URI    cough and cong- went to [place]/ sore from coughing- green production    URI   This is a new problem. The current episode started in the past 7 days (Thursday). The problem has been gradually worsening. There has been no fever. Associated symptoms include congestion, coughing, ear pain, sinus pain and a sore throat. Pertinent negatives include no abdominal pain, chest pain, diarrhea, dysuria, headaches, joint pain, joint swelling, nausea, neck pain, plugged ear sensation, rash, rhinorrhea, sneezing, swollen glands, vomiting or wheezing. Associated symptoms comments: Sore all over due to cough. She has tried antihistamine for the symptoms. The treatment provided mild relief.  Cough  This is a new problem. The current episode started in the past 7 days. The problem has been gradually worsening. The cough is productive of brown sputum. Associated symptoms include ear pain and a sore throat. Pertinent negatives include no chest pain, chills, fever, headaches, heartburn, myalgias, rash, rhinorrhea, shortness of breath, weight loss or wheezing. The symptoms are aggravated by pollens. There is no history of environmental allergies.    No problem-specific Assessment & Plan notes found for this encounter.   Past Medical History:  Diagnosis Date  . Allergy   . Asthma   . Cancer [facility]) 2012   right breast cancer lumpectomy with rad tx  . Depression   . Edema   . GERD (gastroesophageal reflux disease)   . Gout   . Hyperlipidemia   . Hypertension     Past Surgical History:  Procedure Laterality Date  . BREAST BIOPSY Bilateral    negative  . BREAST EXCISIONAL BIOPSY Right    right positive 04/2010  . BREAST LUMPECTOMY Right    removed a cancerous lump  . VAGINAL HYSTERECTOMY      Family History  Problem Relation  Age of Onset  . Breast cancer Neg Hx     Social History   Social History  . Marital status: Widowed    Spouse name: N/A  . Number of children: N/A  . Years of education: N/A   Occupational History  . Not on file.   Social History Main Topics  . Smoking status: Former Research scientist (life sciences)  . Smokeless tobacco: Never Used  . Alcohol use No  . Drug use: No  . Sexual activity: Not Currently   Other Topics Concern  . Not on file   Social History Narrative  . No narrative on file    Allergies  Allergen Reactions  . Sulfa Antibiotics     Outpatient Medications Prior to Visit  Medication Sig Dispense Refill  . acetaminophen (TYLENOL) 325 MG tablet Take by mouth.    Marland Kitchen albuterol (PROVENTIL HFA;VENTOLIN HFA) 108 (90 Base) MCG/ACT inhaler Inhale 2 puffs into the lungs every 6 (six) hours as needed for wheezing or shortness of breath. 1 Inhaler 0  . albuterol (PROVENTIL) (2.5 MG/3ML) 0.083% nebulizer solution Take 3 mLs (2.5 mg total) by nebulization every 6 (six) hours as needed for wheezing or shortness of breath. 75 mL 12  . allopurinol (ZYLOPRIM) 100 MG tablet TAKE (1) TABLET BY MOUTH EVERY DAY 90 tablet 0  . aspirin 81 MG tablet Take 81 mg by mouth daily.    . Fluticasone-Salmeterol (ADVAIR DISKUS) 100-50 MCG/DOSE AEPB Inhale 1 puff into the lungs  2 (two) times daily. 3 each 1  . furosemide (LASIX) 40 MG tablet Take 1 tablet (40 mg total) by mouth daily. Taking 1/2 tablet daily 90 tablet 1  . loratadine (CLARITIN) 10 MG tablet Take 1 tablet (10 mg total) by mouth daily. 90 tablet 1  . losartan (COZAAR) 100 MG tablet TAKE (1) TABLET BY MOUTH EVERY DAY 90 tablet 0  . lovastatin (MEVACOR) 40 MG tablet Take 1 tablet (40 mg total) by mouth at bedtime. 90 tablet 1  . nystatin cream (MYCOSTATIN) Apply 1 application topically as needed. 30 g 11  . omeprazole (PRILOSEC) 20 MG capsule TAKE (1) CAPSULE BY MOUTH EVERY DAY 90 capsule 0  . sertraline (ZOLOFT) 50 MG tablet TAKE (1) TABLET BY MOUTH EVERY  DAY 90 tablet 0   No facility-administered medications prior to visit.     Review of Systems  Constitutional: Negative for chills, fever, malaise/fatigue and weight loss.  HENT: Positive for congestion, ear pain, sinus pain and sore throat. Negative for ear discharge, rhinorrhea and sneezing.   Eyes: Negative for blurred vision.  Respiratory: Positive for cough. Negative for sputum production, shortness of breath and wheezing.   Cardiovascular: Negative for chest pain, palpitations and leg swelling.  Gastrointestinal: Negative for abdominal pain, blood in stool, constipation, diarrhea, heartburn, melena, nausea and vomiting.  Genitourinary: Negative for dysuria, frequency, hematuria and urgency.  Musculoskeletal: Negative for back pain, joint pain, myalgias and neck pain.  Skin: Negative for rash.  Neurological: Negative for dizziness, tingling, sensory change, focal weakness and headaches.  Endo/Heme/Allergies: Negative for environmental allergies and polydipsia. Does not bruise/bleed easily.  Psychiatric/Behavioral: Negative for depression and suicidal ideas. The patient is not nervous/anxious and does not have insomnia.      Objective  Vitals:   11/30/16 1511  BP: 110/60  Pulse: 84  SpO2: 98%  Weight: 237 lb (107.5 kg)  Height: 5\' 3"  (1.6 m)    Physical Exam  Constitutional: She is well-developed, well-nourished, and in no distress. No distress.  HENT:  Head: Normocephalic and atraumatic.  Right Ear: External ear normal.  Left Ear: External ear normal.  Nose: Nose normal.  Mouth/Throat: Oropharynx is clear and moist.  Eyes: Pupils are equal, round, and reactive to light. Conjunctivae and EOM are normal. Right eye exhibits no discharge. Left eye exhibits no discharge.  Neck: Normal range of motion. Neck supple. No JVD present. No thyromegaly present.  Cardiovascular: Normal rate, regular rhythm, normal heart sounds and intact distal pulses.  Exam reveals no gallop and no  friction rub.   No murmur heard. Pulmonary/Chest: Effort normal. She has no wheezes. She has no rales.  Abdominal: Soft. Bowel sounds are normal. She exhibits no mass. There is no tenderness. There is no guarding.  Musculoskeletal: Normal range of motion. She exhibits no edema.  Lymphadenopathy:    She has no cervical adenopathy.  Neurological: She is alert.  Skin: Skin is warm and dry. No rash noted. She is not diaphoretic.  Psychiatric: Mood and affect normal.  Nursing note and vitals reviewed.     Assessment & Plan  Problem List Items Addressed This Visit    None    Visit Diagnoses    Bronchitis    -  Primary   Relevant Medications   levofloxacin (LEVAQUIN) 500 MG tablet   guaiFENesin-codeine (ROBITUSSIN AC) 100-10 MG/5ML syrup      Meds ordered this encounter  Medications  . levofloxacin (LEVAQUIN) 500 MG tablet    Sig: Take 1 tablet (  500 mg total) by mouth daily.    Dispense:  7 tablet    Refill:  0  . guaiFENesin-codeine (ROBITUSSIN AC) 100-10 MG/5ML syrup    Sig: Take 5 mLs by mouth 3 (three) times daily as needed for cough.    Dispense:  150 mL    Refill:  0      Dr. Otilio Miu [facility]  11/30/16

## 2016-11-30 NOTE — Addendum Note (Signed)
Addended by: Fredderick Severance on: 11/30/2016 04:25 PM   Modules accepted: Orders

## 2016-12-04 ENCOUNTER — Other Ambulatory Visit: Payer: Self-pay | Admitting: Family Medicine

## 2016-12-04 DIAGNOSIS — K219 Gastro-esophageal reflux disease without esophagitis: Secondary | ICD-10-CM

## 2016-12-04 DIAGNOSIS — I10 Essential (primary) hypertension: Secondary | ICD-10-CM

## 2016-12-07 ENCOUNTER — Other Ambulatory Visit: Payer: Self-pay

## 2016-12-07 ENCOUNTER — Telehealth: Payer: Self-pay

## 2016-12-07 DIAGNOSIS — J40 Bronchitis, not specified as acute or chronic: Secondary | ICD-10-CM

## 2016-12-07 MED ORDER — AMOXICILLIN-POT CLAVULANATE 875-125 MG PO TABS: 1.0000 | ORAL_TABLET | Freq: Two times a day (BID) | ORAL | 0 refills | Status: DC

## 2016-12-07 MED ORDER — BENZONATATE 100 MG PO CAPS: 100.0000 mg | ORAL_CAPSULE | Freq: Two times a day (BID) | ORAL | 0 refills | Status: DC | PRN

## 2016-12-07 NOTE — Telephone Encounter (Signed)
Pt called in stating that she "just ain't quite cleared up." Sent in Augmentin 875 BID and she requested Tessalon Perles instead of cough syrup due to insurance not paying. Sent to [facility]

## 2016-12-31 ENCOUNTER — Ambulatory Visit (INDEPENDENT_AMBULATORY_CARE_PROVIDER_SITE_OTHER): Payer: Medicare Other | Admitting: Family Medicine

## 2016-12-31 ENCOUNTER — Encounter: Payer: Self-pay | Admitting: Family Medicine

## 2016-12-31 VITALS — BP 122/70 | HR 64 | Ht 63.0 in | Wt 235.0 lb

## 2016-12-31 DIAGNOSIS — J4520 Mild intermittent asthma, uncomplicated: Secondary | ICD-10-CM | POA: Diagnosis not present

## 2016-12-31 DIAGNOSIS — K219 Gastro-esophageal reflux disease without esophagitis: Secondary | ICD-10-CM

## 2016-12-31 DIAGNOSIS — E790 Hyperuricemia without signs of inflammatory arthritis and tophaceous disease: Secondary | ICD-10-CM | POA: Diagnosis not present

## 2016-12-31 DIAGNOSIS — E784 Other hyperlipidemia: Secondary | ICD-10-CM | POA: Diagnosis not present

## 2016-12-31 DIAGNOSIS — E7849 Other hyperlipidemia: Secondary | ICD-10-CM

## 2016-12-31 DIAGNOSIS — I10 Essential (primary) hypertension: Secondary | ICD-10-CM | POA: Diagnosis not present

## 2016-12-31 DIAGNOSIS — R69 Illness, unspecified: Secondary | ICD-10-CM | POA: Diagnosis not present

## 2016-12-31 DIAGNOSIS — F3341 Major depressive disorder, recurrent, in partial remission: Secondary | ICD-10-CM | POA: Diagnosis not present

## 2016-12-31 DIAGNOSIS — Z23 Encounter for immunization: Secondary | ICD-10-CM

## 2016-12-31 MED ORDER — OMEPRAZOLE 20 MG PO CPDR: DELAYED_RELEASE_CAPSULE | ORAL | 1 refills | Status: DC

## 2016-12-31 MED ORDER — LOSARTAN POTASSIUM 100 MG PO TABS: ORAL_TABLET | ORAL | 1 refills | Status: DC

## 2016-12-31 MED ORDER — SERTRALINE HCL 50 MG PO TABS: ORAL_TABLET | ORAL | 1 refills | Status: DC

## 2016-12-31 MED ORDER — ALLOPURINOL 100 MG PO TABS: ORAL_TABLET | ORAL | 1 refills | Status: DC

## 2016-12-31 MED ORDER — FLUTICASONE-SALMETEROL 100-50 MCG/DOSE IN AEPB: 1.0000 | INHALATION_SPRAY | Freq: Two times a day (BID) | RESPIRATORY_TRACT | 1 refills | Status: DC

## 2016-12-31 MED ORDER — FUROSEMIDE 20 MG PO TABS: 20.0000 mg | ORAL_TABLET | Freq: Every day | ORAL | 1 refills | Status: DC

## 2016-12-31 MED ORDER — LOVASTATIN 40 MG PO TABS: 40.0000 mg | ORAL_TABLET | Freq: Every day | ORAL | 1 refills | Status: DC

## 2016-12-31 MED ORDER — ALBUTEROL SULFATE HFA 108 (90 BASE) MCG/ACT IN AERS: 2.0000 | INHALATION_SPRAY | Freq: Four times a day (QID) | RESPIRATORY_TRACT | 0 refills | Status: DC | PRN

## 2016-12-31 NOTE — Progress Notes (Signed)
Name: Kirsten Snyder   MRN: [id number]    DOB: 06/17/1933   Date:12/31/2016       Progress Note  Subjective  Chief Complaint  Chief Complaint  Patient presents with  . Asthma  . Gastroesophageal Reflux  . Hyperlipidemia  . Hypertension  . Depression  . Gout  . Leg Swelling    takes half furosemide 40mg  daily    Asthma  She complains of cough. There is no chest tightness, difficulty breathing, frequent throat clearing, hemoptysis, hoarse voice, shortness of breath, sputum production or wheezing. This is a recurrent problem. The current episode started more than 1 month ago. The problem occurs intermittently. The problem has been waxing and waning. The cough is non-productive. Pertinent negatives include no chest pain, dyspnea on exertion, ear pain, fever, headaches, heartburn, malaise/fatigue, myalgias, nasal congestion, orthopnea, PND, rhinorrhea, sneezing, sore throat, sweats or weight loss. Her symptoms are aggravated by change in weather. She reports moderate improvement on treatment. Her symptoms are not alleviated by diuretics. There are no known risk factors for lung disease. Her past medical history is significant for asthma.  Gastroesophageal Reflux  She complains of coughing. She reports no abdominal pain, no chest pain, no choking, no heartburn, no hoarse voice, no nausea, no sore throat, no stridor or no wheezing. The current episode started 1 to 4 weeks ago. The problem occurs frequently. The problem has been waxing and waning. Pertinent negatives include no melena, orthopnea or weight loss. She has tried a PPI for the symptoms.  Hyperlipidemia  This is a chronic problem. The current episode started more than 1 year ago. The problem is controlled. Recent lipid tests were reviewed and are normal. She has no history of chronic renal disease. Pertinent negatives include no chest pain, focal weakness, myalgias or shortness of breath. Current antihyperlipidemic treatment includes  statins. The current treatment provides moderate improvement of lipids. There are no compliance problems.   Hypertension  The problem is controlled. Pertinent negatives include no blurred vision, chest pain, headaches, malaise/fatigue, neck pain, palpitations, PND, shortness of breath or sweats. Risk factors for coronary artery disease include dyslipidemia. Past treatments include diuretics and angiotensin blockers. The current treatment provides mild improvement. There are no compliance problems.  There is no history of angina, kidney disease, CAD/MI, CVA, heart failure, left ventricular hypertrophy, PVD or retinopathy. There is no history of chronic renal disease, a hypertension causing med or renovascular disease.  Depression         This is a chronic problem.  The current episode started more than 1 year ago.   The onset quality is gradual.   The problem has been waxing and waning since onset.  Associated symptoms include no helplessness, no hopelessness, does not have insomnia, not irritable, no decreased interest, no myalgias, no headaches, not sad and no suicidal ideas.  Past treatments include SSRIs - Selective serotonin reuptake inhibitors.   No problem-specific Assessment & Plan notes found for this encounter.   Past Medical History:  Diagnosis Date  . Allergy   . Asthma   . Cancer [facility]) 2012   right breast cancer lumpectomy with rad tx  . Depression   . Edema   . GERD (gastroesophageal reflux disease)   . Gout   . Hyperlipidemia   . Hypertension     Past Surgical History:  Procedure Laterality Date  . BREAST BIOPSY Bilateral    negative  . BREAST EXCISIONAL BIOPSY Right    right positive 04/2010  . BREAST  LUMPECTOMY Right    removed a cancerous lump  . VAGINAL HYSTERECTOMY      Family History  Problem Relation Age of Onset  . Breast cancer Neg Hx     Social History   Social History  . Marital status: Widowed    Spouse name: N/A  . Number of children: N/A  .  Years of education: N/A   Occupational History  . Not on file.   Social History Main Topics  . Smoking status: Former Research scientist (life sciences)  . Smokeless tobacco: Never Used  . Alcohol use No  . Drug use: No  . Sexual activity: Not Currently   Other Topics Concern  . Not on file   Social History Narrative  . No narrative on file    Allergies  Allergen Reactions  . Sulfa Antibiotics     Outpatient Medications Prior to Visit  Medication Sig Dispense Refill  . acetaminophen (TYLENOL) 325 MG tablet Take by mouth.    Marland Kitchen albuterol (PROVENTIL) (2.5 MG/3ML) 0.083% nebulizer solution Take 3 mLs (2.5 mg total) by nebulization every 6 (six) hours as needed for wheezing or shortness of breath. 75 mL 12  . aspirin 81 MG tablet Take 81 mg by mouth daily.    Marland Kitchen loratadine (CLARITIN) 10 MG tablet Take 1 tablet (10 mg total) by mouth daily. 90 tablet 1  . nystatin cream (MYCOSTATIN) Apply 1 application topically as needed. 30 g 11  . albuterol (PROVENTIL HFA;VENTOLIN HFA) 108 (90 Base) MCG/ACT inhaler Inhale 2 puffs into the lungs every 6 (six) hours as needed for wheezing or shortness of breath. 1 Inhaler 0  . allopurinol (ZYLOPRIM) 100 MG tablet TAKE (1) TABLET BY MOUTH EVERY DAY 90 tablet 0  . Fluticasone-Salmeterol (ADVAIR DISKUS) 100-50 MCG/DOSE AEPB Inhale 1 puff into the lungs 2 (two) times daily. 3 each 1  . furosemide (LASIX) 40 MG tablet TAKE (1) TABLET BY MOUTH EVERY DAY (Patient taking differently: 1/2 tablet daily) 90 tablet 0  . losartan (COZAAR) 100 MG tablet TAKE (1) TABLET BY MOUTH EVERY DAY 90 tablet 0  . lovastatin (MEVACOR) 40 MG tablet Take 1 tablet (40 mg total) by mouth at bedtime. 90 tablet 1  . omeprazole (PRILOSEC) 20 MG capsule TAKE ONE (1) CAPSULE EACH DAY. 90 capsule 0  . sertraline (ZOLOFT) 50 MG tablet TAKE (1) TABLET BY MOUTH EVERY DAY 90 tablet 0  . amoxicillin-clavulanate (AUGMENTIN) 875-125 MG tablet Take 1 tablet by mouth 2 (two) times daily. 20 tablet 0  . benzonatate  (TESSALON) 100 MG capsule Take 1 capsule (100 mg total) by mouth 2 (two) times daily as needed for cough. 20 capsule 0  . guaiFENesin-codeine (ROBITUSSIN AC) 100-10 MG/5ML syrup Take 5 mLs by mouth 3 (three) times daily as needed for cough. 150 mL 0  . levofloxacin (LEVAQUIN) 500 MG tablet Take 1 tablet (500 mg total) by mouth daily. 7 tablet 0   No facility-administered medications prior to visit.     Review of Systems  Constitutional: Negative for chills, fever, malaise/fatigue and weight loss.  HENT: Negative for ear discharge, ear pain, hoarse voice, rhinorrhea, sneezing and sore throat.   Eyes: Negative for blurred vision.  Respiratory: Positive for cough. Negative for hemoptysis, sputum production, choking, shortness of breath and wheezing.   Cardiovascular: Negative for chest pain, dyspnea on exertion, palpitations, leg swelling and PND.  Gastrointestinal: Negative for abdominal pain, blood in stool, constipation, diarrhea, heartburn, melena and nausea.  Genitourinary: Negative for dysuria, frequency, hematuria and urgency.  Musculoskeletal: Negative for back pain, joint pain, myalgias and neck pain.  Skin: Negative for rash.  Neurological: Negative for dizziness, tingling, sensory change, focal weakness and headaches.  Endo/Heme/Allergies: Negative for environmental allergies and polydipsia. Does not bruise/bleed easily.  Psychiatric/Behavioral: Positive for depression. Negative for suicidal ideas. The patient is not nervous/anxious and does not have insomnia.      Objective  Vitals:   12/31/16 1003  BP: 122/70  Pulse: 64  Weight: 235 lb (106.6 kg)  Height: 5\' 3"  (1.6 m)    Physical Exam  Constitutional: She is well-developed, well-nourished, and in no distress. She is not irritable. No distress.  HENT:  Head: Normocephalic and atraumatic.  Right Ear: External ear normal.  Left Ear: External ear normal.  Nose: Nose normal.  Mouth/Throat: Oropharynx is clear and moist.   Eyes: Pupils are equal, round, and reactive to light. Conjunctivae and EOM are normal. Right eye exhibits no discharge. Left eye exhibits no discharge.  Neck: Normal range of motion. Neck supple. No JVD present. No thyromegaly present.  Cardiovascular: Normal rate, regular rhythm, normal heart sounds and intact distal pulses.  Exam reveals no gallop and no friction rub.   No murmur heard. Pulmonary/Chest: Effort normal and breath sounds normal. She has no wheezes. She has no rales.  Abdominal: Soft. Bowel sounds are normal. She exhibits no mass. There is no tenderness. There is no guarding.  Musculoskeletal: Normal range of motion. She exhibits no edema.  Lymphadenopathy:    She has no cervical adenopathy.  Neurological: She is alert.  Skin: Skin is warm and dry. She is not diaphoretic.  Psychiatric: Mood and affect normal.  Nursing note and vitals reviewed.     Assessment & Plan  Problem List Items Addressed This Visit      Cardiovascular and Mediastinum   Essential hypertension - Primary   Relevant Medications   losartan (COZAAR) 100 MG tablet   lovastatin (MEVACOR) 40 MG tablet   furosemide (LASIX) 20 MG tablet   Other Relevant Orders   Renal Function Panel     Respiratory   Airway hyperreactivity   Relevant Medications   Fluticasone-Salmeterol (ADVAIR DISKUS) 100-50 MCG/DOSE AEPB   albuterol (PROVENTIL HFA;VENTOLIN HFA) 108 (90 Base) MCG/ACT inhaler     Digestive   Acid reflux   Relevant Medications   omeprazole (PRILOSEC) 20 MG capsule     Other   Familial multiple lipoprotein-type hyperlipidemia   Relevant Medications   losartan (COZAAR) 100 MG tablet   lovastatin (MEVACOR) 40 MG tablet   furosemide (LASIX) 20 MG tablet   Other Relevant Orders   Lipid Profile   Clinical depression   Relevant Medications   sertraline (ZOLOFT) 50 MG tablet    Other Visit Diagnoses    Hyperuricemia       Relevant Medications   allopurinol (ZYLOPRIM) 100 MG tablet    Influenza vaccine needed       Relevant Orders   Flu vaccine HIGH DOSE PF (Completed)   Need for vaccination against Streptococcus pneumoniae using pneumococcal conjugate vaccine 13       Relevant Orders   Pneumococcal conjugate vaccine 13-valent IM (Completed)   Taking medication for chronic disease       Relevant Orders   Hepatic Function Panel (6)      Meds ordered this encounter  Medications  . sertraline (ZOLOFT) 50 MG tablet    Sig: TAKE (1) TABLET BY MOUTH EVERY DAY    Dispense:  90 tablet  Refill:  1  . losartan (COZAAR) 100 MG tablet    Sig: TAKE (1) TABLET BY MOUTH EVERY DAY    Dispense:  90 tablet    Refill:  1  . lovastatin (MEVACOR) 40 MG tablet    Sig: Take 1 tablet (40 mg total) by mouth at bedtime.    Dispense:  90 tablet    Refill:  1  . omeprazole (PRILOSEC) 20 MG capsule    Sig: TAKE ONE (1) CAPSULE EACH DAY.    Dispense:  90 capsule    Refill:  1  . allopurinol (ZYLOPRIM) 100 MG tablet    Sig: TAKE (1) TABLET BY MOUTH EVERY DAY    Dispense:  90 tablet    Refill:  1  . Fluticasone-Salmeterol (ADVAIR DISKUS) 100-50 MCG/DOSE AEPB    Sig: Inhale 1 puff into the lungs 2 (two) times daily.    Dispense:  3 each    Refill:  1  . albuterol (PROVENTIL HFA;VENTOLIN HFA) 108 (90 Base) MCG/ACT inhaler    Sig: Inhale 2 puffs into the lungs every 6 (six) hours as needed for wheezing or shortness of breath.    Dispense:  1 Inhaler    Refill:  0  . furosemide (LASIX) 20 MG tablet    Sig: Take 1 tablet (20 mg total) by mouth daily.    Dispense:  90 tablet    Refill:  1      Dr. Otilio Miu [facility]  12/31/16

## 2017-01-01 LAB — RENAL FUNCTION PANEL
ALBUMIN: 4.4 g/dL (ref 3.5–4.7)
BUN/Creatinine Ratio: 20 (ref 12–28)
BUN: 16 mg/dL (ref 8–27)
CHLORIDE: 99 mmol/L (ref 96–106)
CO2: 29 mmol/L (ref 20–29)
Calcium: 9.3 mg/dL (ref 8.7–10.3)
Creatinine, Ser: 0.79 mg/dL (ref 0.57–1.00)
GFR calc Af Amer: 81 mL/min/{1.73_m2} (ref >59)
GFR calc non Af Amer: 70 mL/min/{1.73_m2} (ref >59)
GLUCOSE: 96 mg/dL (ref 65–99)
PHOSPHORUS: 3.3 mg/dL (ref 2.5–4.5)
POTASSIUM: 4.5 mmol/L (ref 3.5–5.2)
Sodium: 143 mmol/L (ref 134–144)

## 2017-01-01 LAB — HEPATIC FUNCTION PANEL (6)
ALT: 13 IU/L (ref 0–32)
AST: 18 IU/L (ref 0–40)
Alkaline Phosphatase: 90 IU/L (ref 39–117)
Bilirubin Total: 0.5 mg/dL (ref 0.0–1.2)
Bilirubin, Direct: 0.14 mg/dL (ref 0.00–0.40)

## 2017-01-01 LAB — LIPID PANEL
CHOLESTEROL TOTAL: 200 mg/dL — AB (ref 100–199)
Chol/HDL Ratio: 4.4 ratio (ref 0.0–4.4)
HDL: 45 mg/dL (ref >39)
LDL Calculated: 125 mg/dL — ABNORMAL HIGH (ref 0–99)
TRIGLYCERIDES: 149 mg/dL (ref 0–149)
VLDL Cholesterol Cal: 30 mg/dL (ref 5–40)

## 2017-02-01 ENCOUNTER — Other Ambulatory Visit: Payer: Self-pay

## 2017-03-04 ENCOUNTER — Ambulatory Visit (INDEPENDENT_AMBULATORY_CARE_PROVIDER_SITE_OTHER): Payer: Medicare Other | Admitting: Family Medicine

## 2017-03-04 ENCOUNTER — Encounter: Payer: Self-pay | Admitting: Family Medicine

## 2017-03-04 VITALS — BP 130/80 | HR 78 | Ht 63.0 in | Wt 239.0 lb

## 2017-03-04 DIAGNOSIS — J40 Bronchitis, not specified as acute or chronic: Secondary | ICD-10-CM | POA: Diagnosis not present

## 2017-03-04 DIAGNOSIS — J4520 Mild intermittent asthma, uncomplicated: Secondary | ICD-10-CM | POA: Diagnosis not present

## 2017-03-04 MED ORDER — LEVOFLOXACIN 500 MG PO TABS: 500.0000 mg | ORAL_TABLET | Freq: Every day | ORAL | 0 refills | Status: DC

## 2017-03-04 NOTE — Progress Notes (Signed)
Name: Kirsten Snyder   MRN: [id number]    DOB: 11-16-1933   Date:03/04/2017       Progress Note  Subjective  Chief Complaint  Chief Complaint  Patient presents with  . Cough    cough in am and pm- green production especially in am    Cough  This is a new problem. The current episode started 1 to 4 weeks ago. The problem has been gradually worsening. The problem occurs every few minutes. The cough is productive of purulent sputum. Associated symptoms include chills, nasal congestion, postnasal drip and wheezing. Pertinent negatives include no chest pain, ear congestion, ear pain, fever, headaches, heartburn, hemoptysis, myalgias, rash, rhinorrhea, sore throat, shortness of breath or weight loss. Nothing aggravates the symptoms. The treatment provided moderate relief. There is no history of asthma, bronchiectasis, COPD, emphysema, environmental allergies or pneumonia.    No problem-specific Assessment & Plan notes found for this encounter.   Past Medical History:  Diagnosis Date  . Allergy   . Asthma   . Cancer [facility]) 2012   right breast cancer lumpectomy with rad tx  . Depression   . Edema   . GERD (gastroesophageal reflux disease)   . Gout   . Hyperlipidemia   . Hypertension     Past Surgical History:  Procedure Laterality Date  . BREAST BIOPSY Bilateral    negative  . BREAST EXCISIONAL BIOPSY Right    right positive 04/2010  . BREAST LUMPECTOMY Right    removed a cancerous lump  . VAGINAL HYSTERECTOMY      Family History  Problem Relation Age of Onset  . Breast cancer Neg Hx     Social History   Socioeconomic History  . Marital status: Widowed    Spouse name: Not on file  . Number of children: Not on file  . Years of education: Not on file  . Highest education level: Not on file  Social Needs  . Financial resource strain: Not on file  . Food insecurity - worry: Not on file  . Food insecurity - inability: Not on file  . Transportation needs - medical: Not on  file  . Transportation needs - non-medical: Not on file  Occupational History  . Not on file  Tobacco Use  . Smoking status: Former Research scientist (life sciences)  . Smokeless tobacco: Never Used  Substance and Sexual Activity  . Alcohol use: No    Alcohol/week: 0.0 oz  . Drug use: No  . Sexual activity: Not Currently  Other Topics Concern  . Not on file  Social History Narrative  . Not on file    Allergies  Allergen Reactions  . Sulfa Antibiotics     Outpatient Medications Prior to Visit  Medication Sig Dispense Refill  . acetaminophen (TYLENOL) 325 MG tablet Take by mouth.    Marland Kitchen albuterol (PROVENTIL HFA;VENTOLIN HFA) 108 (90 Base) MCG/ACT inhaler Inhale 2 puffs into the lungs every 6 (six) hours as needed for wheezing or shortness of breath. 1 Inhaler 0  . albuterol (PROVENTIL) (2.5 MG/3ML) 0.083% nebulizer solution Take 3 mLs (2.5 mg total) by nebulization every 6 (six) hours as needed for wheezing or shortness of breath. 75 mL 12  . allopurinol (ZYLOPRIM) 100 MG tablet TAKE (1) TABLET BY MOUTH EVERY DAY 90 tablet 1  . aspirin 81 MG tablet Take 81 mg by mouth daily.    . Fluticasone-Salmeterol (ADVAIR DISKUS) 100-50 MCG/DOSE AEPB Inhale 1 puff into the lungs 2 (two) times daily. 3 each 1  .  furosemide (LASIX) 20 MG tablet Take 1 tablet (20 mg total) by mouth daily. 90 tablet 1  . loratadine (CLARITIN) 10 MG tablet Take 1 tablet (10 mg total) by mouth daily. 90 tablet 1  . losartan (COZAAR) 100 MG tablet TAKE (1) TABLET BY MOUTH EVERY DAY 90 tablet 1  . lovastatin (MEVACOR) 40 MG tablet Take 1 tablet (40 mg total) by mouth at bedtime. 90 tablet 1  . nystatin cream (MYCOSTATIN) Apply 1 application topically as needed. 30 g 11  . omeprazole (PRILOSEC) 20 MG capsule TAKE ONE (1) CAPSULE EACH DAY. 90 capsule 1  . sertraline (ZOLOFT) 50 MG tablet TAKE (1) TABLET BY MOUTH EVERY DAY 90 tablet 1   No facility-administered medications prior to visit.     Review of Systems  Constitutional: Positive for  chills. Negative for fever, malaise/fatigue and weight loss.  HENT: Positive for postnasal drip. Negative for ear discharge, ear pain, rhinorrhea and sore throat.   Eyes: Negative for blurred vision.  Respiratory: Positive for cough and wheezing. Negative for hemoptysis, sputum production and shortness of breath.   Cardiovascular: Negative for chest pain, palpitations and leg swelling.  Gastrointestinal: Negative for abdominal pain, blood in stool, constipation, diarrhea, heartburn, melena and nausea.  Genitourinary: Negative for dysuria, frequency, hematuria and urgency.  Musculoskeletal: Negative for back pain, joint pain, myalgias and neck pain.  Skin: Negative for rash.  Neurological: Negative for dizziness, tingling, sensory change, focal weakness and headaches.  Endo/Heme/Allergies: Negative for environmental allergies and polydipsia. Does not bruise/bleed easily.  Psychiatric/Behavioral: Negative for depression and suicidal ideas. The patient is not nervous/anxious and does not have insomnia.      Objective  Vitals:   03/04/17 1532  BP: 130/80  Pulse: 78  Weight: 239 lb (108.4 kg)  Height: 5\' 3"  (1.6 m)    Physical Exam  Constitutional: She is well-developed, well-nourished, and in no distress. No distress.  HENT:  Head: Normocephalic and atraumatic.  Right Ear: External ear normal.  Left Ear: External ear normal.  Nose: Nose normal.  Mouth/Throat: Oropharynx is clear and moist.  Eyes: Conjunctivae and EOM are normal. Pupils are equal, round, and reactive to light. Right eye exhibits no discharge. Left eye exhibits no discharge.  Neck: Normal range of motion. Neck supple. No JVD present. No thyromegaly present.  Cardiovascular: Normal rate, regular rhythm, normal heart sounds and intact distal pulses. Exam reveals no gallop and no friction rub.  No murmur heard. Pulmonary/Chest: Effort normal and breath sounds normal. She has no wheezes. She has no rales.  Abdominal:  Soft. Bowel sounds are normal. She exhibits no mass. There is no tenderness. There is no guarding.  Musculoskeletal: Normal range of motion. She exhibits no edema.  Lymphadenopathy:    She has no cervical adenopathy.  Neurological: She is alert. She has normal reflexes.  Skin: Skin is warm and dry. She is not diaphoretic.  Psychiatric: Mood and affect normal.  Nursing note and vitals reviewed.     Assessment & Plan  Problem List Items Addressed This Visit      Respiratory   Airway hyperreactivity    Other Visit Diagnoses    Bronchitis    -  Primary      Meds ordered this encounter  Medications  . levofloxacin (LEVAQUIN) 500 MG tablet    Sig: Take 1 tablet (500 mg total) daily by mouth.    Dispense:  7 tablet    Refill:  0      Dr. Tilda Burrow  [facility]  03/04/17

## 2017-04-06 ENCOUNTER — Other Ambulatory Visit: Payer: Self-pay | Admitting: Family Medicine

## 2017-04-06 DIAGNOSIS — E790 Hyperuricemia without signs of inflammatory arthritis and tophaceous disease: Secondary | ICD-10-CM

## 2017-05-04 ENCOUNTER — Other Ambulatory Visit: Payer: Self-pay | Admitting: Family Medicine

## 2017-05-04 DIAGNOSIS — E7849 Other hyperlipidemia: Secondary | ICD-10-CM

## 2017-05-18 ENCOUNTER — Other Ambulatory Visit: Payer: Self-pay | Admitting: Family Medicine

## 2017-05-18 DIAGNOSIS — F3341 Major depressive disorder, recurrent, in partial remission: Secondary | ICD-10-CM

## 2017-05-28 ENCOUNTER — Ambulatory Visit
Admission: RE | Admit: 2017-05-28 | Discharge: 2017-05-28 | Disposition: A | Discharge status: Home / Self Care | Payer: Medicare Other | Source: Ambulatory Visit | Attending: Surgery | Admitting: Surgery

## 2017-05-28 ENCOUNTER — Ambulatory Visit
Admission: RE | Admit: 2017-05-28 | Discharge: 2017-05-28 | Disposition: A | Discharge status: Home / Self Care | Payer: Medicare Other | Source: Ambulatory Visit | Attending: Oncology | Admitting: Oncology

## 2017-05-28 DIAGNOSIS — C50911 Malignant neoplasm of unspecified site of right female breast: Secondary | ICD-10-CM | POA: Insufficient documentation

## 2017-05-28 DIAGNOSIS — Z853 Personal history of malignant neoplasm of breast: Secondary | ICD-10-CM

## 2017-05-28 DIAGNOSIS — Z170 Estrogen receptor positive status [ER+]: Secondary | ICD-10-CM | POA: Insufficient documentation

## 2017-05-28 DIAGNOSIS — N63 Unspecified lump in unspecified breast: Secondary | ICD-10-CM

## 2017-05-28 DIAGNOSIS — R928 Other abnormal and inconclusive findings on diagnostic imaging of breast: Secondary | ICD-10-CM | POA: Diagnosis not present

## 2017-06-13 NOTE — Progress Notes (Signed)
[facility]  [phone]  Fax:([phone]) [place] DOB: 03-18-1934  MR#: [id number]  XVQ#:[id number]  Patient Care Team: [name], MD as PCP - General (Family Medicine)  CHIEF COMPLAINT:  ER/PR positive, HER-2 negative malignant neoplasm of the right breast, unspecified site.  INTERVAL HISTORY: Patient returns to clinic today for routine yearly evaluation. She currently feels well and is asymptomatic. She has no neurologic complaints. She denies any recent fevers or illnesses. She has a good appetite and denies weight loss. She denies any chest pain or shortness of breath. She has no nausea, vomiting, constipation, or diarrhea. She has no urinary complaints. Patient offers no specific complaints today.  REVIEW OF SYSTEMS:   Review of Systems  Constitutional: Negative.  Negative for fever, malaise/fatigue and weight loss.  HENT: Negative.   Respiratory: Negative.  Negative for cough and shortness of breath.   Cardiovascular: Negative.  Negative for chest pain and leg swelling.  Gastrointestinal: Negative.  Negative for abdominal pain.  Genitourinary: Negative.   Musculoskeletal: Negative.   Skin: Negative.  Negative for rash.  Neurological: Negative.  Negative for sensory change and weakness.  Psychiatric/Behavioral: Negative.  The patient is not nervous/anxious.     As per HPI. Otherwise, a complete review of systems is negative.  ONCOLOGY HISTORY:  No history exists.    PAST MEDICAL HISTORY: Past Medical History:  Diagnosis Date  . Allergy   . Asthma   . Cancer [facility]) 2012   right breast cancer lumpectomy with rad tx  . Depression   . Edema   . GERD (gastroesophageal reflux disease)   . Gout   . Hyperlipidemia   . Hypertension     PAST SURGICAL HISTORY: Past Surgical History:  Procedure Laterality Date  . BREAST BIOPSY Bilateral    negative  . BREAST EXCISIONAL BIOPSY Right    right positive 04/2010  . BREAST  LUMPECTOMY Right    removed a cancerous lump  . VAGINAL HYSTERECTOMY      FAMILY HISTORY Family History  Problem Relation Age of Onset  . Breast cancer Neg Hx     GYNECOLOGIC HISTORY:  No LMP recorded. Patient has had a hysterectomy.     ADVANCED DIRECTIVES:    HEALTH MAINTENANCE: Social History   Tobacco Use  . Smoking status: Former Research scientist (life sciences)  . Smokeless tobacco: Never Used  Substance Use Topics  . Alcohol use: No    Alcohol/week: 0.0 oz  . Drug use: No    Allergies  Allergen Reactions  . Sulfa Antibiotics     Current Outpatient Medications  Medication Sig Dispense Refill  . acetaminophen (TYLENOL) 325 MG tablet Take by mouth.    Marland Kitchen albuterol (PROVENTIL HFA;VENTOLIN HFA) 108 (90 Base) MCG/ACT inhaler Inhale 2 puffs into the lungs every 6 (six) hours as needed for wheezing or shortness of breath. 1 Inhaler 0  . albuterol (PROVENTIL) (2.5 MG/3ML) 0.083% nebulizer solution Take 3 mLs (2.5 mg total) by nebulization every 6 (six) hours as needed for wheezing or shortness of breath. 75 mL 12  . allopurinol (ZYLOPRIM) 100 MG tablet TAKE ONE (1) TABLET BY MOUTH ONCE DAILY 90 tablet 0  . aspirin 81 MG tablet Take 81 mg by mouth daily.    . Fluticasone-Salmeterol (ADVAIR DISKUS) 100-50 MCG/DOSE AEPB Inhale 1 puff into the lungs 2 (two) times daily. 3 each 1  . furosemide (LASIX) 20 MG tablet Take 1 tablet (20 mg total) by mouth daily. 90 tablet  1  . loratadine (CLARITIN) 10 MG tablet Take 1 tablet (10 mg total) by mouth daily. 90 tablet 1  . losartan (COZAAR) 100 MG tablet TAKE (1) TABLET BY MOUTH EVERY DAY 90 tablet 1  . lovastatin (MEVACOR) 40 MG tablet TAKE ONE TABLET AT BEDTIME. 90 tablet 0  . nystatin cream (MYCOSTATIN) Apply 1 application topically as needed. 30 g 11  . omeprazole (PRILOSEC) 20 MG capsule TAKE ONE (1) CAPSULE EACH DAY. 90 capsule 1  . sertraline (ZOLOFT) 50 MG tablet TAKE ONE (1) TABLET BY MOUTH ONCE DAILY 90 tablet 0   No current facility-administered  medications for this visit.     OBJECTIVE: BP 132/78   Pulse 80   Temp (!) 97.4 F (36.3 C) (Tympanic)   Resp 20   Wt 241 lb 2.9 oz (109.4 kg)   BMI 42.72 kg/m    Body mass index is 42.72 kg/m.    ECOG FS:0 - Asymptomatic  General: Well-developed, well-nourished, no acute distress. Eyes: Pink conjunctiva, anicteric sclera. Breasts: Bilateral breast and axilla without lumps or masses. Lungs: Clear to auscultation bilaterally. Heart: Regular rate and rhythm. No rubs, murmurs, or gallops. Abdomen: Soft, nontender, nondistended. No organomegaly noted, normoactive bowel sounds. Musculoskeletal: No edema, cyanosis, or clubbing. Neuro: Alert, answering all questions appropriately. Cranial nerves grossly intact. Skin: No rashes or petechiae noted. Psych: Normal affect.   LAB RESULTS:  No visits with results within 3 Day(s) from this visit.  Latest known visit with results is:  Office Visit on 12/31/2016  Component Date Value Ref Range Status  . Glucose 12/31/2016 96  65 - 99 mg/dL Final  . BUN 12/31/2016 16  8 - 27 mg/dL Final  . Creatinine, Ser 12/31/2016 0.79  0.57 - 1.00 mg/dL Final  . GFR calc non Af Amer 12/31/2016 70  >59 mL/min/1.73 Final  . GFR calc Af Amer 12/31/2016 81  >59 mL/min/1.73 Final  . BUN/Creatinine Ratio 12/31/2016 20  12 - 28 Final  . Sodium 12/31/2016 143  134 - 144 mmol/L Final  . Potassium 12/31/2016 4.5  3.5 - 5.2 mmol/L Final  . Chloride 12/31/2016 99  96 - 106 mmol/L Final  . CO2 12/31/2016 29  20 - 29 mmol/L Final  . Calcium 12/31/2016 9.3  8.7 - 10.3 mg/dL Final  . Phosphorus 12/31/2016 3.3  2.5 - 4.5 mg/dL Final  . Albumin 12/31/2016 4.4  3.5 - 4.7 g/dL Final  . Cholesterol, Total 12/31/2016 200* 100 - 199 mg/dL Final  . Triglycerides 12/31/2016 149  0 - 149 mg/dL Final  . HDL 12/31/2016 45  >39 mg/dL Final  . VLDL Cholesterol Cal 12/31/2016 30  5 - 40 mg/dL Final  . LDL Calculated 12/31/2016 125* 0 - 99 mg/dL Final  . Chol/HDL Ratio 12/31/2016  4.4  0.0 - 4.4 ratio Final   Comment:                                   T. Chol/HDL Ratio                                             Men  Women  1/2 Avg.Risk  3.4    3.3                                   Avg.Risk  5.0    4.4                                2X Avg.Risk  9.6    7.1                                3X Avg.Risk 23.4   11.0   . Bilirubin Total 12/31/2016 0.5  0.0 - 1.2 mg/dL Final  . Bilirubin, Direct 12/31/2016 0.14  0.00 - 0.40 mg/dL Final  . Alkaline Phosphatase 12/31/2016 90  39 - 117 IU/L Final  . AST 12/31/2016 18  0 - 40 IU/L Final  . ALT 12/31/2016 13  0 - 32 IU/L Final    STUDIES: No results found.  ASSESSMENT: ER/PR positive, HER-2 negative malignant neoplasm of the right breast, unspecified site.  PLAN:   1. ER/PR positive, HER-2 negative malignant neoplasm of the right breast, unspecified site: Patient was noted to be a pathologic stage IA (T1b,N0,M0). Oncotype DX score was low risk and 14. She is status post wide local excision and sentinel node biopsy. Patient completed 5 years of anastrozole in March 2017. Her most recent mammogram on May 28, 2017 was reported as BI-RADS 2. Return to clinic in 1 year for further evaluation.  2. Osteopenia: Continue calcium and vitamin D. Bone mineral densities are monitored by primary care.   Approximately 20 minutes spent in discussion of which greater than 50% was consultation.  Patient expressed understanding and was in agreement with this plan. She also understands that She can call clinic at any time with any questions, concerns, or complaints.    Lloyd Huger, MD   06/18/2017 11:29 AM

## 2017-06-18 ENCOUNTER — Other Ambulatory Visit: Payer: Self-pay | Admitting: *Deleted

## 2017-06-18 ENCOUNTER — Inpatient Hospital Stay: Payer: Medicare Other | Attending: Oncology | Admitting: Oncology

## 2017-06-18 ENCOUNTER — Encounter: Payer: Self-pay | Admitting: Oncology

## 2017-06-18 VITALS — BP 132/78 | HR 80 | Temp 97.4°F | Resp 20 | Wt 241.2 lb

## 2017-06-18 DIAGNOSIS — Z87891 Personal history of nicotine dependence: Secondary | ICD-10-CM | POA: Insufficient documentation

## 2017-06-18 DIAGNOSIS — K219 Gastro-esophageal reflux disease without esophagitis: Secondary | ICD-10-CM | POA: Diagnosis not present

## 2017-06-18 DIAGNOSIS — Z7982 Long term (current) use of aspirin: Secondary | ICD-10-CM | POA: Diagnosis not present

## 2017-06-18 DIAGNOSIS — C50919 Malignant neoplasm of unspecified site of unspecified female breast: Secondary | ICD-10-CM

## 2017-06-18 DIAGNOSIS — M109 Gout, unspecified: Secondary | ICD-10-CM | POA: Diagnosis not present

## 2017-06-18 DIAGNOSIS — Z170 Estrogen receptor positive status [ER+]: Secondary | ICD-10-CM | POA: Diagnosis not present

## 2017-06-18 DIAGNOSIS — I10 Essential (primary) hypertension: Secondary | ICD-10-CM | POA: Diagnosis not present

## 2017-06-18 DIAGNOSIS — J45909 Unspecified asthma, uncomplicated: Secondary | ICD-10-CM | POA: Insufficient documentation

## 2017-06-18 DIAGNOSIS — Z79899 Other long term (current) drug therapy: Secondary | ICD-10-CM | POA: Diagnosis not present

## 2017-06-18 DIAGNOSIS — Z9223 Personal history of estrogen therapy: Secondary | ICD-10-CM | POA: Insufficient documentation

## 2017-06-18 DIAGNOSIS — Z853 Personal history of malignant neoplasm of breast: Secondary | ICD-10-CM | POA: Diagnosis not present

## 2017-06-18 DIAGNOSIS — Z882 Allergy status to sulfonamides status: Secondary | ICD-10-CM | POA: Diagnosis not present

## 2017-06-18 DIAGNOSIS — M8580 Other specified disorders of bone density and structure, unspecified site: Secondary | ICD-10-CM | POA: Insufficient documentation

## 2017-06-18 DIAGNOSIS — C50911 Malignant neoplasm of unspecified site of right female breast: Secondary | ICD-10-CM

## 2017-06-18 DIAGNOSIS — E785 Hyperlipidemia, unspecified: Secondary | ICD-10-CM | POA: Diagnosis not present

## 2017-06-18 NOTE — Progress Notes (Signed)
Patient denies any concerns today.  

## 2017-06-25 ENCOUNTER — Other Ambulatory Visit: Payer: Medicare Other

## 2017-07-02 ENCOUNTER — Other Ambulatory Visit: Payer: Self-pay | Admitting: Family Medicine

## 2017-07-02 DIAGNOSIS — E790 Hyperuricemia without signs of inflammatory arthritis and tophaceous disease: Secondary | ICD-10-CM

## 2017-07-29 ENCOUNTER — Other Ambulatory Visit: Payer: Self-pay | Admitting: Family Medicine

## 2017-07-29 DIAGNOSIS — E7849 Other hyperlipidemia: Secondary | ICD-10-CM

## 2017-08-31 ENCOUNTER — Other Ambulatory Visit: Payer: Self-pay | Admitting: Family Medicine

## 2017-08-31 DIAGNOSIS — K219 Gastro-esophageal reflux disease without esophagitis: Secondary | ICD-10-CM

## 2017-09-03 ENCOUNTER — Encounter: Payer: Self-pay | Admitting: Family Medicine

## 2017-09-03 ENCOUNTER — Ambulatory Visit (INDEPENDENT_AMBULATORY_CARE_PROVIDER_SITE_OTHER): Payer: Medicare Other | Admitting: Family Medicine

## 2017-09-03 VITALS — BP 120/62 | HR 100 | Temp 98.3°F | Ht 63.0 in | Wt 244.0 lb

## 2017-09-03 DIAGNOSIS — J0100 Acute maxillary sinusitis, unspecified: Secondary | ICD-10-CM

## 2017-09-03 DIAGNOSIS — J40 Bronchitis, not specified as acute or chronic: Secondary | ICD-10-CM

## 2017-09-03 MED ORDER — DOXYCYCLINE HYCLATE 100 MG PO TABS
100.0000 mg | ORAL_TABLET | Freq: Two times a day (BID) | ORAL | 0 refills | Status: DC
Start: 2017-09-03 — End: 2017-09-03

## 2017-09-03 MED ORDER — AMOXICILLIN 500 MG PO CAPS: 500.0000 mg | ORAL_CAPSULE | Freq: Three times a day (TID) | ORAL | 0 refills | Status: DC

## 2017-09-03 MED ORDER — BENZONATATE 100 MG PO CAPS: 100.0000 mg | ORAL_CAPSULE | Freq: Three times a day (TID) | ORAL | 1 refills | Status: DC | PRN

## 2017-09-03 NOTE — Progress Notes (Signed)
Name: Kirsten Snyder   MRN: [id number]    DOB: 06-11-1933   Date:09/03/2017       Progress Note  Subjective  Chief Complaint  Chief Complaint  Patient presents with  . jittery    feeling shaky inside, nauseated, congestion- greenish "if I can get it up"    Cough  This is a new problem. The current episode started 1 to 4 weeks ago ("weeks"). The problem has been waxing and waning. The cough is productive of purulent sputum (green). Associated symptoms include chills, ear congestion, ear pain, headaches, myalgias, nasal congestion, postnasal drip, shortness of breath and wheezing. Pertinent negatives include no chest pain, fever, heartburn, hemoptysis, rash, rhinorrhea, sore throat, sweats or weight loss. The symptoms are aggravated by pollens. She has tried a beta-agonist inhaler for the symptoms. There is no history of asthma or environmental allergies.    No problem-specific Assessment & Plan notes found for this encounter.   Past Medical History:  Diagnosis Date  . Allergy   . Asthma   . Cancer [facility]) 2012   right breast cancer lumpectomy with rad tx  . Depression   . Edema   . GERD (gastroesophageal reflux disease)   . Gout   . Hyperlipidemia   . Hypertension     Past Surgical History:  Procedure Laterality Date  . BREAST BIOPSY Bilateral    negative  . BREAST EXCISIONAL BIOPSY Right    right positive 04/2010  . BREAST LUMPECTOMY Right    removed a cancerous lump  . VAGINAL HYSTERECTOMY      Family History  Problem Relation Age of Onset  . Breast cancer Neg Hx     Social History   Socioeconomic History  . Marital status: Widowed    Spouse name: Not on file  . Number of children: Not on file  . Years of education: Not on file  . Highest education level: Not on file  Occupational History  . Not on file  Social Needs  . Financial resource strain: Not on file  . Food insecurity:    Worry: Not on file    Inability: Not on file  . Transportation needs:     Medical: Not on file    Non-medical: Not on file  Tobacco Use  . Smoking status: Former Research scientist (life sciences)  . Smokeless tobacco: Never Used  Substance and Sexual Activity  . Alcohol use: No    Alcohol/week: 0.0 oz  . Drug use: No  . Sexual activity: Not Currently  Lifestyle  . Physical activity:    Days per week: Not on file    Minutes per session: Not on file  . Stress: Not on file  Relationships  . Social connections:    Talks on phone: Not on file    Gets together: Not on file    Attends religious service: Not on file    Active member of club or organization: Not on file    Attends meetings of clubs or organizations: Not on file    Relationship status: Not on file  . Intimate partner violence:    Fear of current or ex partner: Not on file    Emotionally abused: Not on file    Physically abused: Not on file    Forced sexual activity: Not on file  Other Topics Concern  . Not on file  Social History Narrative  . Not on file    Allergies  Allergen Reactions  . Sulfa Antibiotics     Outpatient  Medications Prior to Visit  Medication Sig Dispense Refill  . acetaminophen (TYLENOL) 325 MG tablet Take by mouth.    Marland Kitchen albuterol (PROVENTIL HFA;VENTOLIN HFA) 108 (90 Base) MCG/ACT inhaler Inhale 2 puffs into the lungs every 6 (six) hours as needed for wheezing or shortness of breath. 1 Inhaler 0  . albuterol (PROVENTIL) (2.5 MG/3ML) 0.083% nebulizer solution Take 3 mLs (2.5 mg total) by nebulization every 6 (six) hours as needed for wheezing or shortness of breath. 75 mL 12  . allopurinol (ZYLOPRIM) 100 MG tablet TAKE (1) TABLET BY MOUTH EVERY DAY 90 tablet 0  . aspirin 81 MG tablet Take 81 mg by mouth daily.    . Fluticasone-Salmeterol (ADVAIR DISKUS) 100-50 MCG/DOSE AEPB Inhale 1 puff into the lungs 2 (two) times daily. 3 each 1  . furosemide (LASIX) 20 MG tablet Take 1 tablet (20 mg total) by mouth daily. 90 tablet 1  . loratadine (CLARITIN) 10 MG tablet Take 1 tablet (10 mg total) by  mouth daily. 90 tablet 1  . losartan (COZAAR) 100 MG tablet TAKE (1) TABLET BY MOUTH EVERY DAY 90 tablet 1  . lovastatin (MEVACOR) 40 MG tablet TAKE ONE TABLET AT BEDTIME. 90 tablet 0  . nystatin cream (MYCOSTATIN) Apply 1 application topically as needed. 30 g 11  . omeprazole (PRILOSEC) 20 MG capsule TAKE ONE (1) CAPSULE EACH DAY. 90 capsule 0  . sertraline (ZOLOFT) 50 MG tablet TAKE ONE (1) TABLET BY MOUTH ONCE DAILY 90 tablet 0   No facility-administered medications prior to visit.     Review of Systems  Constitutional: Positive for chills. Negative for fever, malaise/fatigue and weight loss.  HENT: Positive for ear pain and postnasal drip. Negative for ear discharge, rhinorrhea and sore throat.   Eyes: Negative for blurred vision.  Respiratory: Positive for cough, shortness of breath and wheezing. Negative for hemoptysis and sputum production.   Cardiovascular: Negative for chest pain, palpitations and leg swelling.  Gastrointestinal: Negative for abdominal pain, blood in stool, constipation, diarrhea, heartburn, melena and nausea.  Genitourinary: Negative for dysuria, frequency, hematuria and urgency.  Musculoskeletal: Positive for myalgias. Negative for back pain, joint pain and neck pain.  Skin: Negative for rash.  Neurological: Positive for headaches. Negative for dizziness, tingling, sensory change and focal weakness.  Endo/Heme/Allergies: Negative for environmental allergies and polydipsia. Does not bruise/bleed easily.  Psychiatric/Behavioral: Negative for depression and suicidal ideas. The patient is not nervous/anxious and does not have insomnia.      Objective  Vitals:   09/03/17 1102  BP: 120/62  Pulse: 100  Temp: 98.3 F (36.8 C)  TempSrc: Oral  SpO2: 95%  Weight: 244 lb (110.7 kg)  Height: 5\' 3"  (1.6 m)    Physical Exam  Constitutional: She is oriented to person, place, and time. She appears well-developed and well-nourished.  HENT:  Head: Normocephalic.   Right Ear: Tympanic membrane, external ear and ear canal normal.  Left Ear: Tympanic membrane, external ear and ear canal normal.  Nose: No mucosal edema. Right sinus exhibits maxillary sinus tenderness. Left sinus exhibits maxillary sinus tenderness.  Mouth/Throat: Uvula is midline and mucous membranes are normal. Pale mucous membranes: doxy. Posterior oropharyngeal erythema present. No oropharyngeal exudate or posterior oropharyngeal edema.  Eyes: Pupils are equal, round, and reactive to light. Conjunctivae and EOM are normal. Lids are everted and swept, no foreign bodies found. Left eye exhibits no hordeolum. No foreign body present in the left eye. Right conjunctiva is not injected. Left conjunctiva is not injected.  No scleral icterus.  Neck: Normal range of motion. Neck supple. No JVD present. No tracheal deviation present. No thyromegaly present.  Cardiovascular: Normal rate, regular rhythm, normal heart sounds and intact distal pulses. Exam reveals no gallop and no friction rub.  No murmur heard. Pulmonary/Chest: Effort normal and breath sounds normal. No respiratory distress. She has no wheezes. She has no rales.  Abdominal: Soft. Bowel sounds are normal. She exhibits no mass. There is no hepatosplenomegaly. There is no tenderness. There is no rebound and no guarding.  Musculoskeletal: Normal range of motion. She exhibits no edema or tenderness.  Lymphadenopathy:    She has no cervical adenopathy.  Neurological: She is alert and oriented to person, place, and time. She has normal strength. She displays normal reflexes. No cranial nerve deficit.  Skin: Skin is warm. No rash noted.  Psychiatric: She has a normal mood and affect. Her mood appears not anxious. She does not exhibit a depressed mood.  Nursing note and vitals reviewed.     Assessment & Plan  Problem List Items Addressed This Visit    None    Visit Diagnoses    Acute maxillary sinusitis, recurrence not specified    -   Primary   gradual worsening / maxillary tenderness/    Relevant Medications   benzonatate (TESSALON) 100 MG capsule   amoxicillin (AMOXIL) 500 MG capsule   Bronchitis       prod cough for longer than 2 weeks   Relevant Medications   benzonatate (TESSALON) 100 MG capsule      Meds ordered this encounter  Medications  . benzonatate (TESSALON) 100 MG capsule    Sig: Take 1 capsule (100 mg total) by mouth 3 (three) times daily as needed for cough.    Dispense:  20 capsule    Refill:  1  . DISCONTD: doxycycline (VIBRA-TABS) 100 MG tablet    Sig: Take 1 tablet (100 mg total) by mouth 2 (two) times daily.    Dispense:  20 tablet    Refill:  0  . amoxicillin (AMOXIL) 500 MG capsule    Sig: Take 1 capsule (500 mg total) by mouth 3 (three) times daily.    Dispense:  30 capsule    Refill:  0      Dr. Otilio Miu [facility]  09/03/17

## 2017-09-15 ENCOUNTER — Other Ambulatory Visit: Payer: Self-pay

## 2017-10-04 ENCOUNTER — Ambulatory Visit (INDEPENDENT_AMBULATORY_CARE_PROVIDER_SITE_OTHER): Payer: Medicare Other | Admitting: Family Medicine

## 2017-10-04 ENCOUNTER — Other Ambulatory Visit: Payer: Self-pay | Admitting: Family Medicine

## 2017-10-04 ENCOUNTER — Ambulatory Visit
Admission: RE | Admit: 2017-10-04 | Discharge: 2017-10-04 | Disposition: A | Discharge status: Home / Self Care | Payer: Medicare Other | Source: Ambulatory Visit | Attending: Family Medicine | Admitting: Family Medicine

## 2017-10-04 ENCOUNTER — Encounter: Payer: Self-pay | Admitting: Family Medicine

## 2017-10-04 VITALS — BP 118/70 | HR 86 | Temp 99.5°F | Ht 63.0 in | Wt 244.0 lb

## 2017-10-04 DIAGNOSIS — R0602 Shortness of breath: Secondary | ICD-10-CM | POA: Diagnosis not present

## 2017-10-04 DIAGNOSIS — R911 Solitary pulmonary nodule: Secondary | ICD-10-CM | POA: Diagnosis not present

## 2017-10-04 DIAGNOSIS — R05 Cough: Secondary | ICD-10-CM | POA: Diagnosis not present

## 2017-10-04 DIAGNOSIS — J0100 Acute maxillary sinusitis, unspecified: Secondary | ICD-10-CM | POA: Diagnosis not present

## 2017-10-04 DIAGNOSIS — J40 Bronchitis, not specified as acute or chronic: Secondary | ICD-10-CM

## 2017-10-04 DIAGNOSIS — E790 Hyperuricemia without signs of inflammatory arthritis and tophaceous disease: Secondary | ICD-10-CM

## 2017-10-04 MED ORDER — LEVOFLOXACIN 500 MG PO TABS
500.0000 mg | ORAL_TABLET | Freq: Every day | ORAL | 0 refills | Status: DC
Start: 2017-10-04 — End: 2017-11-02

## 2017-10-04 MED ORDER — BENZONATATE 100 MG PO CAPS: 100.0000 mg | ORAL_CAPSULE | Freq: Three times a day (TID) | ORAL | 0 refills | Status: DC | PRN

## 2017-10-04 NOTE — Progress Notes (Signed)
Name: Kirsten Snyder   MRN: [id number]    DOB: [birth year]   Date:10/04/2017       Progress Note  Subjective  Chief Complaint  Chief Complaint  Patient presents with  . Sore Throat    cough, ears bothering her, nauseated- finished Amox on 09/13/17    Sore Throat   This is a new problem. The current episode started in the past 7 days (saturday). The problem has been gradually worsening. The pain is worse on the left side. The maximum temperature recorded prior to her arrival was 100.4 - 100.9 F. The fever has been present for 1 to 2 days. The pain is moderate. Associated symptoms include congestion, coughing, ear pain, headaches, a hoarse voice and vomiting. Pertinent negatives include no abdominal pain, diarrhea, drooling, ear discharge, plugged ear sensation, neck pain, shortness of breath, stridor, swollen glands or trouble swallowing. She has had exposure to strep. She has had no exposure to mono. She has tried NSAIDs for the symptoms. The treatment provided no relief.  Cough  This is a new problem. The current episode started in the past 7 days. The problem has been gradually worsening. The cough is non-productive. Associated symptoms include chills, ear pain, headaches, nasal congestion, postnasal drip and a sore throat. Pertinent negatives include no chest pain, fever, heartburn, myalgias, rash, shortness of breath, weight loss or wheezing. Associated symptoms comments: diaphoresis. There is no history of environmental allergies.    No problem-specific Assessment & Plan notes found for this encounter.   Past Medical History:  Diagnosis Date  . Allergy   . Asthma   . Cancer [facility]) 2012   right breast cancer lumpectomy with rad tx  . Depression   . Edema   . GERD (gastroesophageal reflux disease)   . Gout   . Hyperlipidemia   . Hypertension     Past Surgical History:  Procedure Laterality Date  . BREAST BIOPSY Bilateral    negative  . BREAST EXCISIONAL BIOPSY Right    right  positive 04/2010  . BREAST LUMPECTOMY Right    removed a cancerous lump  . VAGINAL HYSTERECTOMY      Family History  Problem Relation Age of Onset  . Breast cancer Neg Hx     Social History   Socioeconomic History  . Marital status: Widowed    Spouse name: Not on file  . Number of children: Not on file  . Years of education: Not on file  . Highest education level: Not on file  Occupational History  . Not on file  Social Needs  . Financial resource strain: Not on file  . Food insecurity:    Worry: Not on file    Inability: Not on file  . Transportation needs:    Medical: Not on file    Non-medical: Not on file  Tobacco Use  . Smoking status: Former Research scientist (life sciences)  . Smokeless tobacco: Never Used  Substance and Sexual Activity  . Alcohol use: No    Alcohol/week: 0.0 oz  . Drug use: No  . Sexual activity: Not Currently  Lifestyle  . Physical activity:    Days per week: Not on file    Minutes per session: Not on file  . Stress: Not on file  Relationships  . Social connections:    Talks on phone: Not on file    Gets together: Not on file    Attends religious service: Not on file    Active member of club or organization: Not  on file    Attends meetings of clubs or organizations: Not on file    Relationship status: Not on file  . Intimate partner violence:    Fear of current or ex partner: Not on file    Emotionally abused: Not on file    Physically abused: Not on file    Forced sexual activity: Not on file  Other Topics Concern  . Not on file  Social History Narrative  . Not on file    Allergies  Allergen Reactions  . Sulfa Antibiotics     Outpatient Medications Prior to Visit  Medication Sig Dispense Refill  . acetaminophen (TYLENOL) 325 MG tablet Take by mouth.    Marland Kitchen albuterol (PROVENTIL HFA;VENTOLIN HFA) 108 (90 Base) MCG/ACT inhaler Inhale 2 puffs into the lungs every 6 (six) hours as needed for wheezing or shortness of breath. 1 Inhaler 0  . albuterol  (PROVENTIL) (2.5 MG/3ML) 0.083% nebulizer solution Take 3 mLs (2.5 mg total) by nebulization every 6 (six) hours as needed for wheezing or shortness of breath. 75 mL 12  . aspirin 81 MG tablet Take 81 mg by mouth daily.    . Fluticasone-Salmeterol (ADVAIR DISKUS) 100-50 MCG/DOSE AEPB Inhale 1 puff into the lungs 2 (two) times daily. 3 each 1  . furosemide (LASIX) 20 MG tablet Take 1 tablet (20 mg total) by mouth daily. 90 tablet 1  . loratadine (CLARITIN) 10 MG tablet Take 1 tablet (10 mg total) by mouth daily. 90 tablet 1  . losartan (COZAAR) 100 MG tablet TAKE (1) TABLET BY MOUTH EVERY DAY 90 tablet 1  . lovastatin (MEVACOR) 40 MG tablet TAKE ONE TABLET AT BEDTIME. 90 tablet 0  . nystatin cream (MYCOSTATIN) Apply 1 application topically as needed. 30 g 11  . omeprazole (PRILOSEC) 20 MG capsule TAKE ONE (1) CAPSULE EACH DAY. 90 capsule 0  . sertraline (ZOLOFT) 50 MG tablet TAKE ONE (1) TABLET BY MOUTH ONCE DAILY 90 tablet 0  . allopurinol (ZYLOPRIM) 100 MG tablet TAKE (1) TABLET BY MOUTH EVERY DAY 90 tablet 0  . amoxicillin (AMOXIL) 500 MG capsule Take 1 capsule (500 mg total) by mouth 3 (three) times daily. 30 capsule 0  . benzonatate (TESSALON) 100 MG capsule Take 1 capsule (100 mg total) by mouth 3 (three) times daily as needed for cough. 20 capsule 1   No facility-administered medications prior to visit.     Review of Systems  Constitutional: Positive for chills. Negative for fever, malaise/fatigue and weight loss.  HENT: Positive for congestion, ear pain, hoarse voice, postnasal drip and sore throat. Negative for drooling, ear discharge and trouble swallowing.   Eyes: Negative for blurred vision.  Respiratory: Positive for cough. Negative for sputum production, shortness of breath, wheezing and stridor.   Cardiovascular: Negative for chest pain, palpitations and leg swelling.  Gastrointestinal: Positive for vomiting. Negative for abdominal pain, blood in stool, constipation, diarrhea,  heartburn, melena and nausea.  Genitourinary: Negative for dysuria, frequency, hematuria and urgency.  Musculoskeletal: Negative for back pain, joint pain, myalgias and neck pain.  Skin: Negative for rash.  Neurological: Positive for headaches. Negative for dizziness, tingling, sensory change and focal weakness.  Endo/Heme/Allergies: Negative for environmental allergies and polydipsia. Does not bruise/bleed easily.  Psychiatric/Behavioral: Negative for depression and suicidal ideas. The patient is not nervous/anxious and does not have insomnia.      Objective  Vitals:   10/04/17 1530  BP: 118/70  Pulse: 86  Temp: 99.5 F (37.5 C)  TempSrc: Oral  Weight: 244 lb (110.7 kg)  Height: 5\' 3"  (1.6 m)    Physical Exam  Constitutional: No distress.  HENT:  Head: Normocephalic and atraumatic.  Right Ear: Tympanic membrane, external ear and ear canal normal. No middle ear effusion.  Left Ear: Tympanic membrane, external ear and ear canal normal.  No middle ear effusion.  Nose: Right sinus exhibits maxillary sinus tenderness. Left sinus exhibits maxillary sinus tenderness.  Mouth/Throat: Mucous membranes are normal. Mucous membranes are not pale, not dry and not cyanotic. Posterior oropharyngeal erythema present. No oropharyngeal exudate or posterior oropharyngeal edema.  Eyes: Pupils are equal, round, and reactive to light. Conjunctivae and EOM are normal. Right eye exhibits no discharge. Left eye exhibits no discharge.  Neck: Normal range of motion. Neck supple. No JVD present. No thyromegaly present.  Cardiovascular: Normal rate, regular rhythm, normal heart sounds and intact distal pulses. Exam reveals no gallop and no friction rub.  No murmur heard. Pulmonary/Chest: Effort normal. She has decreased breath sounds in the left upper field. She has no wheezes. She has no rales. Chest wall is dull to percussion.  Abdominal: Soft. Bowel sounds are normal. She exhibits no mass. There is no  tenderness. There is no guarding.  Musculoskeletal: Normal range of motion. She exhibits no edema.  Lymphadenopathy:       Head (right side): No submandibular adenopathy present.       Head (left side): No submandibular adenopathy present.    She has no cervical adenopathy.  Neurological: She is alert. She has normal reflexes.  Skin: Skin is warm and dry. She is not diaphoretic.  Nursing note and vitals reviewed.     Assessment & Plan  Problem List Items Addressed This Visit    None    Visit Diagnoses    Acute non-recurrent maxillary sinusitis    -  Primary   tenderness of both maxiary sinuses with low grade fever. Will start levaquin 500mg  q days for 7 days.   Relevant Medications   levofloxacin (LEVAQUIN) 500 MG tablet   benzonatate (TESSALON) 100 MG capsule   Bronchitis       Recurrent cough with decrease breath sounds of left side.  will obtain chest xray to evaluate for pneumonia.   Relevant Medications   benzonatate (TESSALON) 100 MG capsule   Other Relevant Orders   DG Chest 2 View (Completed)   Pulmonary nodule       Stable since 2013. chest xray to reevaluate.      Meds ordered this encounter  Medications  . levofloxacin (LEVAQUIN) 500 MG tablet    Sig: Take 1 tablet (500 mg total) by mouth daily.    Dispense:  7 tablet    Refill:  0  . benzonatate (TESSALON) 100 MG capsule    Sig: Take 1 capsule (100 mg total) by mouth 3 (three) times daily as needed for cough.    Dispense:  20 capsule    Refill:  0      Dr. Otilio Miu [facility]  10/04/17

## 2017-10-11 ENCOUNTER — Other Ambulatory Visit: Payer: Self-pay

## 2017-10-11 DIAGNOSIS — J40 Bronchitis, not specified as acute or chronic: Secondary | ICD-10-CM

## 2017-10-11 DIAGNOSIS — J45909 Unspecified asthma, uncomplicated: Secondary | ICD-10-CM

## 2017-10-11 MED ORDER — LEVOFLOXACIN 500 MG PO TABS: 500.0000 mg | ORAL_TABLET | Freq: Every day | ORAL | 0 refills | Status: DC

## 2017-10-11 MED ORDER — ALBUTEROL SULFATE (2.5 MG/3ML) 0.083% IN NEBU: 2.5000 mg | INHALATION_SOLUTION | Freq: Four times a day (QID) | RESPIRATORY_TRACT | 12 refills | Status: DC | PRN

## 2017-11-01 ENCOUNTER — Other Ambulatory Visit: Payer: Self-pay | Admitting: Family Medicine

## 2017-11-01 DIAGNOSIS — I10 Essential (primary) hypertension: Secondary | ICD-10-CM

## 2017-11-01 DIAGNOSIS — B379 Candidiasis, unspecified: Secondary | ICD-10-CM

## 2017-11-01 DIAGNOSIS — B372 Candidiasis of skin and nail: Secondary | ICD-10-CM

## 2017-11-02 ENCOUNTER — Ambulatory Visit (INDEPENDENT_AMBULATORY_CARE_PROVIDER_SITE_OTHER): Payer: Medicare Other | Admitting: Family Medicine

## 2017-11-02 ENCOUNTER — Encounter: Payer: Self-pay | Admitting: Family Medicine

## 2017-11-02 DIAGNOSIS — I10 Essential (primary) hypertension: Secondary | ICD-10-CM | POA: Diagnosis not present

## 2017-11-02 DIAGNOSIS — J301 Allergic rhinitis due to pollen: Secondary | ICD-10-CM | POA: Diagnosis not present

## 2017-11-02 DIAGNOSIS — E790 Hyperuricemia without signs of inflammatory arthritis and tophaceous disease: Secondary | ICD-10-CM | POA: Diagnosis not present

## 2017-11-02 DIAGNOSIS — F3341 Major depressive disorder, recurrent, in partial remission: Secondary | ICD-10-CM | POA: Diagnosis not present

## 2017-11-02 DIAGNOSIS — K219 Gastro-esophageal reflux disease without esophagitis: Secondary | ICD-10-CM | POA: Diagnosis not present

## 2017-11-02 DIAGNOSIS — E7849 Other hyperlipidemia: Secondary | ICD-10-CM

## 2017-11-02 DIAGNOSIS — J4520 Mild intermittent asthma, uncomplicated: Secondary | ICD-10-CM | POA: Diagnosis not present

## 2017-11-02 MED ORDER — FLUTICASONE-SALMETEROL 100-50 MCG/DOSE IN AEPB: 1.0000 | INHALATION_SPRAY | Freq: Two times a day (BID) | RESPIRATORY_TRACT | 5 refills | Status: DC

## 2017-11-02 MED ORDER — LOVASTATIN 40 MG PO TABS: 40.0000 mg | ORAL_TABLET | Freq: Every day | ORAL | 1 refills | Status: DC

## 2017-11-02 MED ORDER — FUROSEMIDE 20 MG PO TABS: 20.0000 mg | ORAL_TABLET | Freq: Every day | ORAL | 1 refills | Status: DC

## 2017-11-02 MED ORDER — OMEPRAZOLE 20 MG PO CPDR: DELAYED_RELEASE_CAPSULE | ORAL | 1 refills | Status: DC

## 2017-11-02 MED ORDER — ALLOPURINOL 100 MG PO TABS: ORAL_TABLET | ORAL | 1 refills | Status: DC

## 2017-11-02 MED ORDER — ALBUTEROL SULFATE (2.5 MG/3ML) 0.083% IN NEBU: 2.5000 mg | INHALATION_SOLUTION | Freq: Four times a day (QID) | RESPIRATORY_TRACT | 12 refills | Status: DC | PRN

## 2017-11-02 MED ORDER — LOSARTAN POTASSIUM 100 MG PO TABS: ORAL_TABLET | ORAL | 1 refills | Status: DC

## 2017-11-02 MED ORDER — SERTRALINE HCL 50 MG PO TABS: ORAL_TABLET | ORAL | 1 refills | Status: DC

## 2017-11-02 MED ORDER — ALBUTEROL SULFATE HFA 108 (90 BASE) MCG/ACT IN AERS: 2.0000 | INHALATION_SPRAY | Freq: Four times a day (QID) | RESPIRATORY_TRACT | 5 refills | Status: DC | PRN

## 2017-11-02 MED ORDER — LORATADINE 10 MG PO TABS: 10.0000 mg | ORAL_TABLET | Freq: Every day | ORAL | 1 refills | Status: DC

## 2017-11-02 NOTE — Assessment & Plan Note (Signed)
Chronic Stable continue mevacor 40 mg daily. Check lipid panel.

## 2017-11-02 NOTE — Assessment & Plan Note (Signed)
Chronic Controlled cont Advair/albuterol inhalers

## 2017-11-02 NOTE — Assessment & Plan Note (Signed)
Chronic Controlled Continued furosemide 20 mg and losartan 100 mg daily Check renal panel.

## 2017-11-02 NOTE — Progress Notes (Signed)
Name: Kirsten Snyder   MRN: [id number]    DOB: 04-Mar-1934   Date:11/02/2017       Progress Note  Subjective  Chief Complaint  Chief Complaint  Patient presents with  . Gastroesophageal Reflux  . Hyperlipidemia  . Hypertension  . COPD  . Depression  . Allergic Rhinitis   . Leg Swelling  . Gout    Gastroesophageal Reflux  She reports no abdominal pain, no belching, no chest pain, no choking, no coughing, no dysphagia, no early satiety, no globus sensation, no heartburn, no hoarse voice, no nausea, no sore throat, no stridor, no tooth decay, no water brash or no wheezing. This is a chronic problem. The current episode started more than 1 year ago. The problem occurs occasionally. The problem has been waxing and waning. The symptoms are aggravated by certain foods. Pertinent negatives include no anemia, fatigue, melena, muscle weakness, orthopnea or weight loss. There are no known risk factors. She has tried a PPI for the symptoms. The treatment provided moderate relief.  Hyperlipidemia  This is a chronic problem. The current episode started more than 1 year ago. The problem is controlled. Recent lipid tests were reviewed and are normal. She has no history of chronic renal disease, diabetes, hypothyroidism, liver disease, obesity or nephrotic syndrome. There are no known factors aggravating her hyperlipidemia. Pertinent negatives include no chest pain, focal sensory loss, focal weakness, leg pain, myalgias or shortness of breath. Current antihyperlipidemic treatment includes statins. The current treatment provides moderate improvement of lipids. There are no compliance problems.  Risk factors for coronary artery disease include dyslipidemia, hypertension, post-menopausal and obesity.  Hypertension  This is a chronic problem. The current episode started more than 1 year ago. The problem is controlled. Pertinent negatives include no anxiety, blurred vision, chest pain, headaches, malaise/fatigue,  neck pain, orthopnea, palpitations, peripheral edema, PND, shortness of breath or sweats. There are no associated agents to hypertension. Risk factors for coronary artery disease include dyslipidemia, obesity, post-menopausal state and stress. Past treatments include diuretics and angiotensin blockers. The current treatment provides moderate improvement. There are no compliance problems.  There is no history of angina, kidney disease, CAD/MI, CVA, heart failure, left ventricular hypertrophy, PVD or retinopathy. There is no history of chronic renal disease, a hypertension causing med, renovascular disease or a thyroid problem.  COPD  There is no chest tightness, cough, difficulty breathing, frequent throat clearing, hemoptysis, hoarse voice, shortness of breath, sputum production or wheezing. This is a chronic problem. The current episode started more than 1 year ago. Pertinent negatives include no appetite change, chest pain, dyspnea on exertion, ear congestion, ear pain, fever, headaches, heartburn, malaise/fatigue, myalgias, nasal congestion, orthopnea, PND, postnasal drip, rhinorrhea, sneezing, sore throat, sweats, trouble swallowing or weight loss. Her symptoms are alleviated by beta-agonist. She reports moderate improvement on treatment. Risk factors for lung disease include travel. Her past medical history is significant for COPD. There is no history of asthma, bronchiectasis, bronchitis, emphysema or pneumonia.  Depression       The patient presents with depression.  This is a chronic problem.  The current episode started more than 1 year ago.   The problem has been gradually improving since onset.  Associated symptoms include sad.  Associated symptoms include no decreased concentration, no fatigue, no helplessness, no hopelessness, does not have insomnia, not irritable, no restlessness, no decreased interest, no appetite change, no body aches, no myalgias, no headaches, no indigestion and no suicidal  ideas.( "have those moments")  Past treatments include SSRIs - Selective serotonin reuptake inhibitors.  Compliance with treatment is good.  Previous treatment provided moderate relief.  Past medical history includes depression.     Pertinent negatives include no chronic fatigue syndrome, no chronic pain, no fibromyalgia, no hypothyroidism, no thyroid problem, no chronic illness, no physical disability, no terminal illness, no dementia, no anxiety and no eating disorder.   Airway hyperreactivity Chronic Controlled cont Advair/albuterol inhalers  Acid reflux Chronic Controlled. Omeprazole 20 mg daily  Essential hypertension Chronic Controlled Continued furosemide 20 mg and losartan 100 mg daily Check renal panel.  Familial multiple lipoprotein-type hyperlipidemia Chronic Stable continue mevacor 40 mg daily. Check lipid panel.   Past Medical History:  Diagnosis Date  . Allergy   . Asthma   . Cancer [facility]) 2012   right breast cancer lumpectomy with rad tx  . Depression   . Edema   . GERD (gastroesophageal reflux disease)   . Gout   . Hyperlipidemia   . Hypertension     Past Surgical History:  Procedure Laterality Date  . BREAST BIOPSY Bilateral    negative  . BREAST EXCISIONAL BIOPSY Right    right positive 04/2010  . BREAST LUMPECTOMY Right    removed a cancerous lump  . VAGINAL HYSTERECTOMY      Family History  Problem Relation Age of Onset  . Breast cancer Neg Hx     Social History   Socioeconomic History  . Marital status: Widowed    Spouse name: Not on file  . Number of children: Not on file  . Years of education: Not on file  . Highest education level: Not on file  Occupational History  . Not on file  Social Needs  . Financial resource strain: Not on file  . Food insecurity:    Worry: Not on file    Inability: Not on file  . Transportation needs:    Medical: Not on file    Non-medical: Not on file  Tobacco Use  . Smoking status: Former Research scientist (life sciences)  .  Smokeless tobacco: Never Used  Substance and Sexual Activity  . Alcohol use: No    Alcohol/week: 0.0 oz  . Drug use: No  . Sexual activity: Not Currently  Lifestyle  . Physical activity:    Days per week: Not on file    Minutes per session: Not on file  . Stress: Not on file  Relationships  . Social connections:    Talks on phone: Not on file    Gets together: Not on file    Attends religious service: Not on file    Active member of club or organization: Not on file    Attends meetings of clubs or organizations: Not on file    Relationship status: Not on file  . Intimate partner violence:    Fear of current or ex partner: Not on file    Emotionally abused: Not on file    Physically abused: Not on file    Forced sexual activity: Not on file  Other Topics Concern  . Not on file  Social History Narrative  . Not on file    Allergies  Allergen Reactions  . Sulfa Antibiotics     Outpatient Medications Prior to Visit  Medication Sig Dispense Refill  . acetaminophen (TYLENOL) 325 MG tablet Take by mouth.    Marland Kitchen aspirin 81 MG tablet Take 81 mg by mouth daily.    Marland Kitchen nystatin cream (MYCOSTATIN) Apply 1 application topically as needed. Helen  g 11  . albuterol (PROVENTIL HFA;VENTOLIN HFA) 108 (90 Base) MCG/ACT inhaler Inhale 2 puffs into the lungs every 6 (six) hours as needed for wheezing or shortness of breath. 1 Inhaler 0  . albuterol (PROVENTIL) (2.5 MG/3ML) 0.083% nebulizer solution Take 3 mLs (2.5 mg total) by nebulization every 6 (six) hours as needed for wheezing or shortness of breath. 75 mL 12  . allopurinol (ZYLOPRIM) 100 MG tablet TAKE (1) TABLET BY MOUTH EVERY DAY 30 tablet 0  . Fluticasone-Salmeterol (ADVAIR DISKUS) 100-50 MCG/DOSE AEPB Inhale 1 puff into the lungs 2 (two) times daily. 3 each 1  . furosemide (LASIX) 20 MG tablet Take 1 tablet (20 mg total) by mouth daily. 90 tablet 1  . loratadine (CLARITIN) 10 MG tablet Take 1 tablet (10 mg total) by mouth daily. 90 tablet 1   . losartan (COZAAR) 100 MG tablet TAKE (1) TABLET BY MOUTH EVERY DAY 90 tablet 1  . lovastatin (MEVACOR) 40 MG tablet TAKE ONE TABLET AT BEDTIME. 90 tablet 0  . omeprazole (PRILOSEC) 20 MG capsule TAKE ONE (1) CAPSULE EACH DAY. 90 capsule 0  . sertraline (ZOLOFT) 50 MG tablet TAKE ONE (1) TABLET BY MOUTH ONCE DAILY 90 tablet 0  . benzonatate (TESSALON) 100 MG capsule Take 1 capsule (100 mg total) by mouth 3 (three) times daily as needed for cough. 20 capsule 0  . levofloxacin (LEVAQUIN) 500 MG tablet Take 1 tablet (500 mg total) by mouth daily. 7 tablet 0  . levofloxacin (LEVAQUIN) 500 MG tablet Take 1 tablet (500 mg total) by mouth daily. 3 tablet 0   No facility-administered medications prior to visit.     Review of Systems  Constitutional: Negative for appetite change, chills, fatigue, fever, malaise/fatigue and weight loss.  HENT: Negative for ear discharge, ear pain, hoarse voice, postnasal drip, rhinorrhea, sneezing, sore throat and trouble swallowing.   Eyes: Negative for blurred vision.  Respiratory: Negative for cough, hemoptysis, sputum production, choking, shortness of breath and wheezing.   Cardiovascular: Negative for chest pain, dyspnea on exertion, palpitations, orthopnea, leg swelling and PND.  Gastrointestinal: Negative for abdominal pain, blood in stool, constipation, diarrhea, dysphagia, heartburn, melena and nausea.  Genitourinary: Negative for dysuria, frequency, hematuria and urgency.  Musculoskeletal: Negative for back pain, joint pain, myalgias, muscle weakness and neck pain.  Skin: Negative for rash.  Neurological: Negative for dizziness, tingling, sensory change, focal weakness and headaches.  Endo/Heme/Allergies: Negative for environmental allergies and polydipsia. Does not bruise/bleed easily.  Psychiatric/Behavioral: Positive for depression. Negative for decreased concentration and suicidal ideas. The patient is not nervous/anxious and does not have insomnia.       Objective  Vitals:   11/02/17 1119  BP: 120/70  Pulse: 80  Weight: 242 lb (109.8 kg)  Height: 5\' 3"  (1.6 m)    Physical Exam  Constitutional: She is oriented to person, place, and time. She appears well-developed and well-nourished. She is not irritable.  HENT:  Head: Normocephalic.  Right Ear: External ear normal.  Left Ear: External ear normal.  Mouth/Throat: Oropharynx is clear and moist.  Eyes: Pupils are equal, round, and reactive to light. Conjunctivae and EOM are normal. Lids are everted and swept, no foreign bodies found. Left eye exhibits no hordeolum. No foreign body present in the left eye. Right conjunctiva is not injected. Left conjunctiva is not injected. No scleral icterus.  Neck: Normal range of motion. Neck supple. No JVD present. No tracheal deviation present. No thyromegaly present.  Cardiovascular: Normal rate, regular rhythm,  normal heart sounds and intact distal pulses. Exam reveals no gallop and no friction rub.  No murmur heard. Pulmonary/Chest: Effort normal and breath sounds normal. No respiratory distress. She has no wheezes. She has no rales.  Abdominal: Soft. Bowel sounds are normal. She exhibits no mass. There is no hepatosplenomegaly. There is no tenderness. There is no rebound and no guarding.  Musculoskeletal: Normal range of motion. She exhibits no edema or tenderness.  Lymphadenopathy:    She has no cervical adenopathy.  Neurological: She is alert and oriented to person, place, and time. She has normal strength. She displays normal reflexes. No cranial nerve deficit.  Skin: Skin is warm. No rash noted.  Psychiatric: She has a normal mood and affect. Her mood appears not anxious. She does not exhibit a depressed mood.  Nursing note and vitals reviewed.     Assessment & Plan  Problem List Items Addressed This Visit      Cardiovascular and Mediastinum   Essential hypertension    Chronic Controlled Continued furosemide 20 mg and losartan  100 mg daily Check renal panel.      Relevant Medications   furosemide (LASIX) 20 MG tablet   losartan (COZAAR) 100 MG tablet   lovastatin (MEVACOR) 40 MG tablet   Other Relevant Orders   Renal function panel     Respiratory   Airway hyperreactivity    Chronic Controlled cont Advair/albuterol inhalers      Relevant Medications   albuterol (PROVENTIL HFA;VENTOLIN HFA) 108 (90 Base) MCG/ACT inhaler   albuterol (PROVENTIL) (2.5 MG/3ML) 0.083% nebulizer solution   Fluticasone-Salmeterol (ADVAIR DISKUS) 100-50 MCG/DOSE AEPB     Digestive   Acid reflux    Chronic Controlled. Omeprazole 20 mg daily      Relevant Medications   omeprazole (PRILOSEC) 20 MG capsule     Other   Clinical depression   Relevant Medications   sertraline (ZOLOFT) 50 MG tablet   Familial multiple lipoprotein-type hyperlipidemia    Chronic Stable continue mevacor 40 mg daily. Check lipid panel.      Relevant Medications   furosemide (LASIX) 20 MG tablet   losartan (COZAAR) 100 MG tablet   lovastatin (MEVACOR) 40 MG tablet   Other Relevant Orders   Lipid panel    Other Visit Diagnoses    Hyperuricemia       Chronic Stable check uric acid /adjust accordingly   Relevant Medications   allopurinol (ZYLOPRIM) 100 MG tablet   Other Relevant Orders   Uric acid   Seasonal allergic rhinitis due to pollen       Controlled eisodically with loratadine 10 mg daily   Relevant Medications   loratadine (CLARITIN) 10 MG tablet      Meds ordered this encounter  Medications  . albuterol (PROVENTIL HFA;VENTOLIN HFA) 108 (90 Base) MCG/ACT inhaler    Sig: Inhale 2 puffs into the lungs every 6 (six) hours as needed for wheezing or shortness of breath.    Dispense:  1 Inhaler    Refill:  5  . albuterol (PROVENTIL) (2.5 MG/3ML) 0.083% nebulizer solution    Sig: Take 3 mLs (2.5 mg total) by nebulization every 6 (six) hours as needed for wheezing or shortness of breath.    Dispense:  75 mL    Refill:  12  .  allopurinol (ZYLOPRIM) 100 MG tablet    Sig: 1 tablet by mouth daily    Dispense:  90 tablet    Refill:  1    Spoke to [employer]  this evening  . Fluticasone-Salmeterol (ADVAIR DISKUS) 100-50 MCG/DOSE AEPB    Sig: Inhale 1 puff into the lungs 2 (two) times daily.    Dispense:  1 each    Refill:  5  . furosemide (LASIX) 20 MG tablet    Sig: Take 1 tablet (20 mg total) by mouth daily.    Dispense:  90 tablet    Refill:  1  . loratadine (CLARITIN) 10 MG tablet    Sig: Take 1 tablet (10 mg total) by mouth daily.    Dispense:  90 tablet    Refill:  1  . losartan (COZAAR) 100 MG tablet    Sig: TAKE (1) TABLET BY MOUTH EVERY DAY    Dispense:  90 tablet    Refill:  1  . lovastatin (MEVACOR) 40 MG tablet    Sig: Take 1 tablet (40 mg total) by mouth at bedtime.    Dispense:  90 tablet    Refill:  1  . omeprazole (PRILOSEC) 20 MG capsule    Sig: One capsule daily    Dispense:  90 capsule    Refill:  1  . sertraline (ZOLOFT) 50 MG tablet    Sig: TAKE ONE (1) TABLET BY MOUTH ONCE DAILY    Dispense:  90 tablet    Refill:  1      Dr. Kahliya [facility]  11/02/17

## 2017-11-02 NOTE — Assessment & Plan Note (Signed)
Chronic Controlled. Omeprazole 20 mg daily

## 2017-11-03 LAB — RENAL FUNCTION PANEL
Albumin: 4.4 g/dL (ref 3.5–4.7)
BUN/Creatinine Ratio: 18 (ref 12–28)
BUN: 14 mg/dL (ref 8–27)
CO2: 26 mmol/L (ref 20–29)
Calcium: 9.5 mg/dL (ref 8.7–10.3)
Chloride: 98 mmol/L (ref 96–106)
Creatinine, Ser: 0.78 mg/dL (ref 0.57–1.00)
GFR, EST AFRICAN AMERICAN: 82 mL/min/{1.73_m2} (ref >59)
GFR, EST NON AFRICAN AMERICAN: 71 mL/min/{1.73_m2} (ref >59)
GLUCOSE: 95 mg/dL (ref 65–99)
POTASSIUM: 4.1 mmol/L (ref 3.5–5.2)
Phosphorus: 3.8 mg/dL (ref 2.5–4.5)
SODIUM: 144 mmol/L (ref 134–144)

## 2017-11-03 LAB — LIPID PANEL
CHOL/HDL RATIO: 4.8 ratio — AB (ref 0.0–4.4)
Cholesterol, Total: 212 mg/dL — ABNORMAL HIGH (ref 100–199)
HDL: 44 mg/dL (ref >39)
LDL Calculated: 131 mg/dL — ABNORMAL HIGH (ref 0–99)
Triglycerides: 183 mg/dL — ABNORMAL HIGH (ref 0–149)
VLDL Cholesterol Cal: 37 mg/dL (ref 5–40)

## 2017-11-03 LAB — URIC ACID: URIC ACID: 4.4 mg/dL (ref 2.5–7.1)

## 2017-11-16 ENCOUNTER — Telehealth: Payer: Self-pay

## 2017-11-16 NOTE — Telephone Encounter (Signed)
Overdue for AWV. Called pt to sched appt w/ [facility]. LVM requesting returned call. 

## 2017-12-13 ENCOUNTER — Ambulatory Visit (INDEPENDENT_AMBULATORY_CARE_PROVIDER_SITE_OTHER): Payer: Medicare Other

## 2017-12-13 VITALS — BP 110/64 | HR 82 | Temp 98.2°F | Resp 14 | Ht 63.0 in | Wt 246.0 lb

## 2017-12-13 DIAGNOSIS — E2839 Other primary ovarian failure: Secondary | ICD-10-CM | POA: Diagnosis not present

## 2017-12-13 DIAGNOSIS — M8589 Other specified disorders of bone density and structure, multiple sites: Secondary | ICD-10-CM

## 2017-12-13 DIAGNOSIS — Z0000 Encounter for general adult medical examination without abnormal findings: Secondary | ICD-10-CM

## 2017-12-13 NOTE — Progress Notes (Signed)
Subjective:   Kirsten Snyder is a 84 y.o. female who presents for an Initial Medicare Annual Wellness Visit.  Review of Systems    N/A  Cardiac Risk Factors include: advanced age (>18men, >60 women);dyslipidemia;hypertension;obesity (BMI >30kg/m2);sedentary lifestyle     Objective:    Today's Vitals   12/13/17 1404  BP: 110/64  Pulse: 82  Resp: 14  Temp: 98.2 F (36.8 C)  SpO2: 95%  Weight: 246 lb (111.6 kg)  Height: 5\' 3"  (1.6 m)   Body mass index is 43.58 kg/m.  Advanced Directives 12/13/2017 06/18/2017 06/19/2016 03/24/2016 11/07/2015 03/29/2015 10/25/2014  Does Patient Have a Medical Advance Directive? No No No No No Yes No  Type of Advance Directive - - - - - Banker in Chart? - - - - - No - copy requested -  Would patient like information on creating a medical advance directive? Yes (MAU/Ambulatory/Procedural Areas - Information given) No - Patient declined No - Patient declined - No - patient declined information - No - patient declined information    Current Medications (verified) Outpatient Encounter Medications as of 12/13/2017  Medication Sig  . acetaminophen (TYLENOL) 325 MG tablet Take by mouth.  Marland Kitchen albuterol (PROVENTIL HFA;VENTOLIN HFA) 108 (90 Base) MCG/ACT inhaler Inhale 2 puffs into the lungs every 6 (six) hours as needed for wheezing or shortness of breath.  Marland Kitchen albuterol (PROVENTIL) (2.5 MG/3ML) 0.083% nebulizer solution Take 3 mLs (2.5 mg total) by nebulization every 6 (six) hours as needed for wheezing or shortness of breath.  . allopurinol (ZYLOPRIM) 100 MG tablet 1 tablet by mouth daily  . aspirin 81 MG tablet Take 81 mg by mouth daily.  . Fluticasone-Salmeterol (ADVAIR DISKUS) 100-50 MCG/DOSE AEPB Inhale 1 puff into the lungs 2 (two) times daily.  . furosemide (LASIX) 20 MG tablet Take 1 tablet (20 mg total) by mouth daily.  Marland Kitchen loratadine (CLARITIN) 10 MG tablet Take 1 tablet (10 mg total) by mouth  daily.  Marland Kitchen losartan (COZAAR) 100 MG tablet TAKE (1) TABLET BY MOUTH EVERY DAY  . lovastatin (MEVACOR) 40 MG tablet Take 1 tablet (40 mg total) by mouth at bedtime.  Marland Kitchen nystatin cream (MYCOSTATIN) Apply 1 application topically as needed.  Marland Kitchen omeprazole (PRILOSEC) 20 MG capsule One capsule daily  . sertraline (ZOLOFT) 50 MG tablet TAKE ONE (1) TABLET BY MOUTH ONCE DAILY   No facility-administered encounter medications on file as of 12/13/2017.     Allergies (verified) Sulfa antibiotics   History: Past Medical History:  Diagnosis Date  . Allergy   . Asthma   . Cancer [facility]) 2012   right breast cancer lumpectomy with rad tx  . Depression   . Edema   . GERD (gastroesophageal reflux disease)   . Gout   . Hyperlipidemia   . Hypertension    Past Surgical History:  Procedure Laterality Date  . BREAST BIOPSY Bilateral    negative  . BREAST EXCISIONAL BIOPSY Right    right positive 04/2010  . BREAST LUMPECTOMY Right    removed a cancerous lump  . VAGINAL HYSTERECTOMY     Family History  Problem Relation Age of Onset  . Leukemia Mother   . Heart disease Father   . Breast cancer Neg Hx    Social History   Socioeconomic History  . Marital status: Widowed    Spouse name: Not on file  . Number of children: 2  . Years of education:  some college  . Highest education level: 12th grade  Occupational History  . Occupation: Retired  Scientific laboratory technician  . Financial resource strain: Not hard at all  . Food insecurity:    Worry: Never true    Inability: Never true  . Transportation needs:    Medical: No    Non-medical: No  Tobacco Use  . Smoking status: Former Smoker    Packs/day: 0.25    Years: 2.00    Pack years: 0.50    Types: Cigarettes  . Smokeless tobacco: Never Used  . Tobacco comment: smoking cessation materials not required  Substance and Sexual Activity  . Alcohol use: No    Alcohol/week: 0.0 standard drinks  . Drug use: No  . Sexual activity: Not Currently  Lifestyle    . Physical activity:    Days per week: 0 days    Minutes per session: 0 min  . Stress: Not at all  Relationships  . Social connections:    Talks on phone: Patient refused    Gets together: Patient refused    Attends religious service: Patient refused    Active member of club or organization: Patient refused    Attends meetings of clubs or organizations: Patient refused    Relationship status: Widowed  Other Topics Concern  . Not on file  Social History Narrative  . Not on file    Tobacco Counseling Counseling given: No Comment: smoking cessation materials not required  Clinical Intake:  Pre-visit preparation completed: Yes  Pain : No/denies pain   BMI - recorded: 43.58 Nutritional Status: BMI > 30  Obese Nutritional Risks: None Diabetes: No  How often do you need to have someone help you when you read instructions, pamphlets, or other written materials from your doctor or pharmacy?: 1 - Never  Interpreter Needed?: No  Information entered by :: AEversole, LPN   Activities of Daily Living In your present state of health, do you have any difficulty performing the following activities: 12/13/2017  Hearing? N  Comment denies hearing aids  Vision? N  Comment wears eyeglasses  Difficulty concentrating or making decisions? N  Walking or climbing stairs? Y  Comment knee and foot pain  Dressing or bathing? N  Doing errands, shopping? N  Preparing Food and eating ? N  Comment partial upper dentures  Using the Toilet? N  In the past six months, have you accidently leaked urine? N  Do you have problems with loss of bowel control? N  Managing your Medications? N  Managing your Finances? N  Housekeeping or managing your Housekeeping? N  Some recent data might be hidden     Immunizations and Health Maintenance Immunization History  Administered Date(s) Administered  . Influenza, High Dose Seasonal PF 12/31/2016  . Pneumococcal Conjugate-13 12/31/2016  . Tdap  05/20/2015   Health Maintenance Due  Topic Date Due  . INFLUENZA VACCINE  11/18/2017    Patient Care Team: [name], MD as PCP - General (Family Medicine) Albertine Patricia, DPM as Consulting Physician (Podiatry) Grayland Ormond Kathlene November, MD as Consulting Physician (Oncology)  Indicate any recent Medical Services you may have received from other than [facility] providers in the past year (date may be approximate).     Assessment:   This is a routine wellness examination for [place].  Hearing/Vision screen Vision Screening Comments: [place] for annual eye exams  Dietary issues and exercise activities discussed: Current Exercise Habits: The patient does not participate in regular exercise at present,  Exercise limited by: None identified  Goals    . DIET - INCREASE WATER INTAKE     Recommend to drink at least 6-8 8oz glasses of water per day.      Depression Screen PHQ 2/9 Scores 12/13/2017 11/02/2017 09/03/2017 05/28/2016 05/20/2015 10/25/2014  PHQ - 2 Score 0 0 0 1 0 0  PHQ- 9 Score 0 3 3 - - -    Fall Risk Fall Risk  12/13/2017 11/02/2017 09/03/2017 05/28/2016 05/20/2015  Falls in the past year? No No No No No  Risk for fall due to : Impaired vision;Impaired balance/gait - - - -  Risk for fall due to: Comment wears eyeglasses; knee and foot pain; ambulates with cane - - - -    FALL RISK PREVENTION PERTAINING TO HOME: Is your home free of loose throw rugs in walkways, pet beds, electrical cords, etc? Yes Is there adequate lighting in your home to reduce risk of falls?  Yes Are there stairs in or around your home WITH handrails? Yes  ASSISTIVE DEVICES UTILIZED TO PREVENT FALLS: Use of a cane, walker or w/c? Yes, cane Grab bars in the bathroom? No  Shower chair or a place to sit while bathing? Yes An elevated toilet seat or a handicapped toilet? Yes  Timed Get Up and Go Performed: Yes. Pt ambulated 10 feet within 24 sec. Gait slow, steady and without the use of an  assistive device. No intervention required at this time. Fall risk prevention has been discussed.  Community Resource Referral:  Pt declined my offer to send [employer] Referral to Care Guide for installation of grab bars in the shower.  Cognitive Function:     6CIT Screen 12/13/2017  What Year? 0 points  What month? 0 points  What time? 0 points  Count back from 20 0 points  Months in reverse 0 points  Repeat phrase 2 points  Total Score 2    Screening Tests Health Maintenance  Topic Date Due  . INFLUENZA VACCINE  11/18/2017  . PNA vac Low Risk Adult (2 of 2 - PPSV23) 12/31/2017  . TETANUS/TDAP  05/19/2025  . DEXA SCAN  Completed   Qualifies for Shingles Vaccine? Yes. Due for Shingrix. Education has been provided regarding the importance of this vaccine. Pt has been advised to call insurance company to determine out of pocket expense. Advised may also receive vaccine at local pharmacy or Health Dept. Verbalized acceptance and understanding.  Cancer Screenings: Lung: Low Dose CT Chest recommended if Age 29-80 years, 30 pack-year currently smoking OR have quit w/in 15years. Patient does not qualify. Breast Screening: No longer required  Bone Density/Dexa: Completed 11/15/14. Results reflect osteopenia. Repeat every 2 years. Ordered today. Advised she will receive a call from our office re: her appt. Colorectal: No longer required  Additional Screenings: Hepatitis C Screening: Does not qualify   Plan:  I have personally reviewed and addressed the Medicare Annual Wellness questionnaire and have noted the following in the patient's chart:  A. Medical and social history B. Use of alcohol, tobacco or illicit drugs  C. Current medications and supplements D. Functional ability and status E.  Nutritional status F.  Physical activity G. Advance directives H. List of other physicians I.  Hospitalizations, surgeries, and ER visits in previous 12 months J.   [place] such as hearing and vision if needed, cognitive and depression L. Referrals and appointments  In addition, I have reviewed and discussed with patient certain preventive protocols, quality  metrics, and best practice recommendations. A written personalized care plan for preventive services as well as general preventive health recommendations were provided to patient.  Signed,  Aleatha Borer, LPN Nurse Health Advisor  MD Recommendations: Due for Shingrix. Education has been provided regarding the importance of this vaccine. Pt has been advised to call insurance company to determine out of pocket expense. Advised may also receive vaccine at local pharmacy or Health Dept. Verbalized acceptance and understanding.  Bone Density/Dexa: Completed 11/15/14. Results reflect osteopenia. Repeat every 2 years. Ordered today. Advised she will receive a call from our office re: her appt.

## 2017-12-13 NOTE — Patient Instructions (Signed)
Kirsten Snyder , Thank you for taking time to come for your Medicare Wellness Visit. I appreciate your ongoing commitment to your health goals. Please review the following plan we discussed and let me know if I can assist you in the future.   Screening recommendations/referrals: Colorectal Screening: No longer required Mammogram: No longer required Bone Density: You will receive a call from our office regarding your appointment  Vision and Dental Exams: Recommended annual ophthalmology exams for early detection of glaucoma and other disorders of the eye Recommended annual dental exams for proper oral hygiene  Vaccinations: Influenza vaccine: Up to date Pneumococcal vaccine: Up to date Tdap vaccine: Up to date Shingles vaccine: Please call your insurance company to determine your out of pocket expense for the Shingrix vaccine. You may also receive this vaccine at your local pharmacy or Health Dept.    Advanced directives: Advance directive discussed with you today. I have provided a copy for you to complete at home and have notarized. Once this is complete please bring a copy in to our office so we can scan it into your chart.  Goals: Recommend to drink at least 6-8 8oz glasses of water per day.  Next appointment: Please schedule your Annual Wellness Visit with your Nurse Health Advisor in one year.  Preventive Care 36 Years and Older, Female Preventive care refers to lifestyle choices and visits with your health care provider that can promote health and wellness. What does preventive care include?  A yearly physical exam. This is also called an annual well check.  Dental exams once or twice a year.  Routine eye exams. Ask your health care provider how often you should have your eyes checked.  Personal lifestyle choices, including:  Daily care of your teeth and gums.  Regular physical activity.  Eating a healthy diet.  Avoiding tobacco and drug use.  Limiting alcohol  use.  Practicing safe sex.  Taking low-dose aspirin every day.  Taking vitamin and mineral supplements as recommended by your health care provider. What happens during an annual well check? The services and screenings done by your health care provider during your annual well check will depend on your age, overall health, lifestyle risk factors, and family history of disease. Counseling  Your health care provider may ask you questions about your:  Alcohol use.  Tobacco use.  Drug use.  Emotional well-being.  Home and relationship well-being.  Sexual activity.  Eating habits.  History of falls.  Memory and ability to understand (cognition).  Work and work Statistician.  Reproductive health. Screening  You may have the following tests or measurements:  Height, weight, and BMI.  Blood pressure.  Lipid and cholesterol levels. These may be checked every 5 years, or more frequently if you are over 37 years old.  Skin check.  Lung cancer screening. You may have this screening every year starting at age 54 if you have a 30-pack-year history of smoking and currently smoke or have quit within the past 15 years.  Fecal occult blood test (FOBT) of the stool. You may have this test every year starting at age 63.  Flexible sigmoidoscopy or colonoscopy. You may have a sigmoidoscopy every 5 years or a colonoscopy every 10 years starting at age 110.  Hepatitis C blood test.  Hepatitis B blood test.  Sexually transmitted disease (STD) testing.  Diabetes screening. This is done by checking your blood sugar (glucose) after you have not eaten for a while (fasting). You may have this done  every 1-3 years.  Bone density scan. This is done to screen for osteoporosis. You may have this done starting at age 52.  Mammogram. This may be done every 1-2 years. Talk to your health care provider about how often you should have regular mammograms. Talk with your health care provider about  your test results, treatment options, and if necessary, the need for more tests. Vaccines  Your health care provider may recommend certain vaccines, such as:  Influenza vaccine. This is recommended every year.  Tetanus, diphtheria, and acellular pertussis (Tdap, Td) vaccine. You may need a Td booster every 10 years.  Zoster vaccine. You may need this after age 21.  Pneumococcal 13-valent conjugate (PCV13) vaccine. One dose is recommended after age 52.  Pneumococcal polysaccharide (PPSV23) vaccine. One dose is recommended after age 60. Talk to your health care provider about which screenings and vaccines you need and how often you need them. This information is not intended to replace advice given to you by your health care provider. Make sure you discuss any questions you have with your health care provider. Document Released: 05/03/2015 Document Revised: 12/25/2015 Document Reviewed: 02/05/2015 Elsevier Interactive Patient Education  2017 [place] Prevention in the Home Falls can cause injuries. They can happen to people of all ages. There are many things you can do to make your home safe and to help prevent falls. What can I do on the outside of my home?  Regularly fix the edges of walkways and driveways and fix any cracks.  Remove anything that might make you trip as you walk through a door, such as a raised step or threshold.  Trim any bushes or trees on the path to your home.  Use bright outdoor lighting.  Clear any walking paths of anything that might make someone trip, such as rocks or tools.  Regularly check to see if handrails are loose or broken. Make sure that both sides of any steps have handrails.  Any raised decks and porches should have guardrails on the edges.  Have any leaves, snow, or ice cleared regularly.  Use sand or salt on walking paths during winter.  Clean up any spills in your garage right away. This includes oil or grease spills. What  can I do in the bathroom?  Use night lights.  Install grab bars by the toilet and in the tub and shower. Do not use towel bars as grab bars.  Use non-skid mats or decals in the tub or shower.  If you need to sit down in the shower, use a plastic, non-slip stool.  Keep the floor dry. Clean up any water that spills on the floor as soon as it happens.  Remove soap buildup in the tub or shower regularly.  Attach bath mats securely with double-sided non-slip rug tape.  Do not have throw rugs and other things on the floor that can make you trip. What can I do in the bedroom?  Use night lights.  Make sure that you have a light by your bed that is easy to reach.  Do not use any sheets or blankets that are too big for your bed. They should not hang down onto the floor.  Have a firm chair that has side arms. You can use this for support while you get dressed.  Do not have throw rugs and other things on the floor that can make you trip. What can I do in the kitchen?  Clean up any spills right  away.  Avoid walking on wet floors.  Keep items that you use a lot in easy-to-reach places.  If you need to reach something above you, use a strong step stool that has a grab bar.  Keep electrical cords out of the way.  Do not use floor polish or wax that makes floors slippery. If you must use wax, use non-skid floor wax.  Do not have throw rugs and other things on the floor that can make you trip. What can I do with my stairs?  Do not leave any items on the stairs.  Make sure that there are handrails on both sides of the stairs and use them. Fix handrails that are broken or loose. Make sure that handrails are as long as the stairways.  Check any carpeting to make sure that it is firmly attached to the stairs. Fix any carpet that is loose or worn.  Avoid having throw rugs at the top or bottom of the stairs. If you do have throw rugs, attach them to the floor with carpet tape.  Make sure  that you have a light switch at the top of the stairs and the bottom of the stairs. If you do not have them, ask someone to add them for you. What else can I do to help prevent falls?  Wear shoes that:  Do not have high heels.  Have rubber bottoms.  Are comfortable and fit you well.  Are closed at the toe. Do not wear sandals.  If you use a stepladder:  Make sure that it is fully opened. Do not climb a closed stepladder.  Make sure that both sides of the stepladder are locked into place.  Ask someone to hold it for you, if possible.  Clearly mark and make sure that you can see:  Any grab bars or handrails.  First and last steps.  Where the edge of each step is.  Use tools that help you move around (mobility aids) if they are needed. These include:  Canes.  Walkers.  Scooters.  Crutches.  Turn on the lights when you go into a dark area. Replace any light bulbs as soon as they burn out.  Set up your furniture so you have a clear path. Avoid moving your furniture around.  If any of your floors are uneven, fix them.  If there are any pets around you, be aware of where they are.  Review your medicines with your doctor. Some medicines can make you feel dizzy. This can increase your chance of falling. Ask your doctor what other things that you can do to help prevent falls. This information is not intended to replace advice given to you by your health care provider. Make sure you discuss any questions you have with your health care provider. Document Released: 01/31/2009 Document Revised: 09/12/2015 Document Reviewed: 05/11/2014 Elsevier Interactive Patient Education  2017 [employer].

## 2017-12-29 ENCOUNTER — Other Ambulatory Visit: Payer: Self-pay | Admitting: Family Medicine

## 2017-12-29 DIAGNOSIS — J301 Allergic rhinitis due to pollen: Secondary | ICD-10-CM

## 2018-01-03 ENCOUNTER — Ambulatory Visit
Admission: RE | Admit: 2018-01-03 | Discharge: 2018-01-03 | Disposition: A | Discharge status: Home / Self Care | Payer: Medicare Other | Source: Ambulatory Visit | Attending: Family Medicine | Admitting: Family Medicine

## 2018-01-03 DIAGNOSIS — Z780 Asymptomatic menopausal state: Secondary | ICD-10-CM | POA: Diagnosis not present

## 2018-01-03 DIAGNOSIS — E2839 Other primary ovarian failure: Secondary | ICD-10-CM | POA: Diagnosis not present

## 2018-01-03 DIAGNOSIS — M8589 Other specified disorders of bone density and structure, multiple sites: Secondary | ICD-10-CM | POA: Diagnosis not present

## 2018-01-28 DIAGNOSIS — Z23 Encounter for immunization: Secondary | ICD-10-CM | POA: Diagnosis not present

## 2018-02-06 DIAGNOSIS — S52352A Displaced comminuted fracture of shaft of radius, left arm, initial encounter for closed fracture: Secondary | ICD-10-CM | POA: Diagnosis not present

## 2018-02-06 DIAGNOSIS — S52592A Other fractures of lower end of left radius, initial encounter for closed fracture: Secondary | ICD-10-CM | POA: Diagnosis not present

## 2018-02-06 DIAGNOSIS — S52562A Barton's fracture of left radius, initial encounter for closed fracture: Secondary | ICD-10-CM | POA: Diagnosis not present

## 2018-02-06 DIAGNOSIS — S52612A Displaced fracture of left ulna styloid process, initial encounter for closed fracture: Secondary | ICD-10-CM | POA: Diagnosis not present

## 2018-02-06 DIAGNOSIS — I10 Essential (primary) hypertension: Secondary | ICD-10-CM | POA: Diagnosis not present

## 2018-02-06 DIAGNOSIS — R110 Nausea: Secondary | ICD-10-CM | POA: Diagnosis not present

## 2018-02-06 DIAGNOSIS — M25532 Pain in left wrist: Secondary | ICD-10-CM | POA: Diagnosis not present

## 2018-02-06 DIAGNOSIS — S59902A Unspecified injury of left elbow, initial encounter: Secondary | ICD-10-CM | POA: Diagnosis not present

## 2018-02-06 DIAGNOSIS — S52502A Unspecified fracture of the lower end of left radius, initial encounter for closed fracture: Secondary | ICD-10-CM | POA: Diagnosis not present

## 2018-02-06 DIAGNOSIS — Z79899 Other long term (current) drug therapy: Secondary | ICD-10-CM | POA: Diagnosis not present

## 2018-02-06 DIAGNOSIS — Z882 Allergy status to sulfonamides status: Secondary | ICD-10-CM | POA: Diagnosis not present

## 2018-02-14 DIAGNOSIS — S52202A Unspecified fracture of shaft of left ulna, initial encounter for closed fracture: Secondary | ICD-10-CM | POA: Diagnosis not present

## 2018-02-14 DIAGNOSIS — S52612D Displaced fracture of left ulna styloid process, subsequent encounter for closed fracture with routine healing: Secondary | ICD-10-CM | POA: Diagnosis not present

## 2018-02-14 DIAGNOSIS — S5292XA Unspecified fracture of left forearm, initial encounter for closed fracture: Secondary | ICD-10-CM | POA: Diagnosis not present

## 2018-02-14 DIAGNOSIS — S52502A Unspecified fracture of the lower end of left radius, initial encounter for closed fracture: Secondary | ICD-10-CM | POA: Diagnosis not present

## 2018-02-14 DIAGNOSIS — S52612A Displaced fracture of left ulna styloid process, initial encounter for closed fracture: Secondary | ICD-10-CM | POA: Diagnosis not present

## 2018-02-14 DIAGNOSIS — S52202D Unspecified fracture of shaft of left ulna, subsequent encounter for closed fracture with routine healing: Secondary | ICD-10-CM | POA: Diagnosis not present

## 2018-02-22 ENCOUNTER — Other Ambulatory Visit: Payer: Self-pay

## 2018-02-22 DIAGNOSIS — B372 Candidiasis of skin and nail: Secondary | ICD-10-CM

## 2018-02-22 DIAGNOSIS — B379 Candidiasis, unspecified: Secondary | ICD-10-CM

## 2018-02-22 MED ORDER — NYSTATIN 100000 UNIT/GM EX CREA: 1.0000 "application " | TOPICAL_CREAM | CUTANEOUS | 1 refills | Status: DC | PRN

## 2018-02-28 DIAGNOSIS — S52502A Unspecified fracture of the lower end of left radius, initial encounter for closed fracture: Secondary | ICD-10-CM | POA: Diagnosis not present

## 2018-02-28 DIAGNOSIS — S52612D Displaced fracture of left ulna styloid process, subsequent encounter for closed fracture with routine healing: Secondary | ICD-10-CM | POA: Diagnosis not present

## 2018-02-28 DIAGNOSIS — S52302D Unspecified fracture of shaft of left radius, subsequent encounter for closed fracture with routine healing: Secondary | ICD-10-CM | POA: Diagnosis not present

## 2018-03-16 DIAGNOSIS — S52502D Unspecified fracture of the lower end of left radius, subsequent encounter for closed fracture with routine healing: Secondary | ICD-10-CM | POA: Diagnosis not present

## 2018-03-30 DIAGNOSIS — S52502D Unspecified fracture of the lower end of left radius, subsequent encounter for closed fracture with routine healing: Secondary | ICD-10-CM | POA: Diagnosis not present

## 2018-03-30 DIAGNOSIS — S52612D Displaced fracture of left ulna styloid process, subsequent encounter for closed fracture with routine healing: Secondary | ICD-10-CM | POA: Diagnosis not present

## 2018-03-30 DIAGNOSIS — S52302D Unspecified fracture of shaft of left radius, subsequent encounter for closed fracture with routine healing: Secondary | ICD-10-CM | POA: Diagnosis not present

## 2018-04-25 ENCOUNTER — Other Ambulatory Visit: Payer: Self-pay | Admitting: Family Medicine

## 2018-04-25 DIAGNOSIS — E7849 Other hyperlipidemia: Secondary | ICD-10-CM

## 2018-04-25 DIAGNOSIS — I10 Essential (primary) hypertension: Secondary | ICD-10-CM

## 2018-05-02 DIAGNOSIS — S52502D Unspecified fracture of the lower end of left radius, subsequent encounter for closed fracture with routine healing: Secondary | ICD-10-CM | POA: Diagnosis not present

## 2018-05-02 DIAGNOSIS — S52612D Displaced fracture of left ulna styloid process, subsequent encounter for closed fracture with routine healing: Secondary | ICD-10-CM | POA: Diagnosis not present

## 2018-05-16 ENCOUNTER — Other Ambulatory Visit: Payer: Self-pay | Admitting: Family Medicine

## 2018-05-16 DIAGNOSIS — F3341 Major depressive disorder, recurrent, in partial remission: Secondary | ICD-10-CM

## 2018-05-17 ENCOUNTER — Other Ambulatory Visit: Payer: Self-pay

## 2018-05-17 DIAGNOSIS — F3341 Major depressive disorder, recurrent, in partial remission: Secondary | ICD-10-CM

## 2018-05-17 MED ORDER — SERTRALINE HCL 50 MG PO TABS: ORAL_TABLET | ORAL | 0 refills | Status: DC

## 2018-05-23 ENCOUNTER — Other Ambulatory Visit: Payer: Self-pay | Admitting: Family Medicine

## 2018-05-23 DIAGNOSIS — I10 Essential (primary) hypertension: Secondary | ICD-10-CM

## 2018-05-23 DIAGNOSIS — K219 Gastro-esophageal reflux disease without esophagitis: Secondary | ICD-10-CM

## 2018-06-03 ENCOUNTER — Ambulatory Visit
Admission: RE | Admit: 2018-06-03 | Discharge: 2018-06-03 | Disposition: A | Discharge status: Home / Self Care | Payer: Medicare Other | Source: Ambulatory Visit | Attending: Oncology | Admitting: Oncology

## 2018-06-03 DIAGNOSIS — C50919 Malignant neoplasm of unspecified site of unspecified female breast: Secondary | ICD-10-CM | POA: Diagnosis not present

## 2018-06-03 DIAGNOSIS — Z1231 Encounter for screening mammogram for malignant neoplasm of breast: Secondary | ICD-10-CM | POA: Insufficient documentation

## 2018-06-03 HISTORY — DX: Personal history of irradiation: Z92.3

## 2018-06-22 ENCOUNTER — Other Ambulatory Visit: Payer: Self-pay | Admitting: Family Medicine

## 2018-06-22 DIAGNOSIS — I10 Essential (primary) hypertension: Secondary | ICD-10-CM

## 2018-06-22 DIAGNOSIS — K219 Gastro-esophageal reflux disease without esophagitis: Secondary | ICD-10-CM

## 2018-06-24 ENCOUNTER — Ambulatory Visit: Payer: Medicare Other | Admitting: Oncology

## 2018-06-29 ENCOUNTER — Ambulatory Visit: Payer: Medicare Other | Admitting: Hematology and Oncology

## 2018-06-29 DIAGNOSIS — M85851 Other specified disorders of bone density and structure, right thigh: Secondary | ICD-10-CM | POA: Insufficient documentation

## 2018-06-29 NOTE — Progress Notes (Deleted)
[facility] day:  06/29/2018  Chief Complaint: Kirsten Snyder is a 85 y.o. female with stage IA right breast cancer who is seen for new patient assessment.  HPI: ***  She has a history of stage IA right breast cancer s/p wide excision on 05/06/2010 by Dr. Rochel Brome.  Pathology revealed a 0.8 cm invasive ductal carcinoma with focal low grade DCIS.  Tumor was ER + (> 90%), PR + (70%), and Her2/ne -.  She underwent sentinel lymph node biopsy on 05/23/2010.  Three lymph nodes were negative.  Pathologic stage was T1bN0.  She received 5 years of anastrazole (completed 06/2015).  The patient was last seen in the medical oncology clinic on 06/18/2017 by Dr. Grayland Ormond.  At that time, she was doing well without evidence of recurrent disease.  Bilateral screening mammogram on 06/03/2018 revealed no evidence of malignancy.  Bone density on 01/03/2018 revealed osteopenia with a T-score of -1.5 in the right femoral neck and -1.1 in AP spine L1-L4.  She is on calcium and vitamin D.   Past Medical History:  Diagnosis Date  . Allergy   . Asthma   . Cancer [facility]) 2012   right breast cancer lumpectomy with rad tx  . Depression   . Edema   . GERD (gastroesophageal reflux disease)   . Gout   . Hyperlipidemia   . Hypertension   . Personal history of radiation therapy     Past Surgical History:  Procedure Laterality Date  . BREAST BIOPSY Bilateral    negative  . BREAST EXCISIONAL BIOPSY Right    right positive 04/2010  . BREAST LUMPECTOMY Right    removed a cancerous lump  . VAGINAL HYSTERECTOMY      Family History  Problem Relation Age of Onset  . Leukemia Mother   . Heart disease Father   . Breast cancer Neg Hx     Social History:  reports that she has quit smoking. Her smoking use included cigarettes. She has a 0.50 pack-year smoking history. She has never used smokeless tobacco. She reports that she does not drink alcohol or use drugs.   The patient is accompanied by *** alone today.  Allergies:  Allergies  Allergen Reactions  . Sulfa Antibiotics     Current Medications: Current Outpatient Medications  Medication Sig Dispense Refill  . acetaminophen (TYLENOL) 325 MG tablet Take by mouth.    Marland Kitchen albuterol (PROVENTIL HFA;VENTOLIN HFA) 108 (90 Base) MCG/ACT inhaler Inhale 2 puffs into the lungs every 6 (six) hours as needed for wheezing or shortness of breath. 1 Inhaler 5  . albuterol (PROVENTIL) (2.5 MG/3ML) 0.083% nebulizer solution Take 3 mLs (2.5 mg total) by nebulization every 6 (six) hours as needed for wheezing or shortness of breath. 75 mL 12  . allopurinol (ZYLOPRIM) 100 MG tablet 1 tablet by mouth daily 90 tablet 1  . aspirin 81 MG tablet Take 81 mg by mouth daily.    . Fluticasone-Salmeterol (ADVAIR DISKUS) 100-50 MCG/DOSE AEPB Inhale 1 puff into the lungs 2 (two) times daily. 1 each 5  . furosemide (LASIX) 20 MG tablet TAKE (1) TABLET BY MOUTH EVERY DAY 30 tablet 0  . loratadine (CLARITIN) 10 MG tablet TAKE (1) TABLET BY MOUTH EVERY DAY 90 tablet 1  . losartan (COZAAR) 100 MG tablet TAKE (1) TABLET BY MOUTH EVERY DAY 90 tablet 0  . lovastatin (MEVACOR) 40 MG tablet TAKE ONE TABLET BY MOUTH AT BEDTIME. 90 tablet 0  .  nystatin cream (MYCOSTATIN) Apply 1 application topically as needed. 30 g 1  . omeprazole (PRILOSEC) 20 MG capsule TAKE (1) CAPSULE BY MOUTH EVERY DAY 30 capsule 0  . sertraline (ZOLOFT) 50 MG tablet TAKE ONE (1) TABLET BY MOUTH ONCE DAILY 90 tablet 0   No current facility-administered medications for this visit.     Review of Systems:  GENERAL:  Feels good.  Active.  No fevers, sweats or weight loss. PERFORMANCE STATUS (ECOG):  *** HEENT:  No visual changes, runny nose, sore throat, mouth sores or tenderness. Lungs: No shortness of breath or cough.  No hemoptysis. Cardiac:  No chest pain, palpitations, orthopnea, or PND. GI:  No nausea, vomiting, diarrhea, constipation, melena or  hematochezia. GU:  No urgency, frequency, dysuria, or hematuria. Musculoskeletal:  No back pain.  No joint pain.  No muscle tenderness. Extremities:  No pain or swelling. Skin:  No rashes or skin changes. Neuro:  No headache, numbness or weakness, balance or coordination issues. Endocrine:  No diabetes, thyroid issues, hot flashes or night sweats. Psych:  No mood changes, depression or anxiety. Pain:  No focal pain. Review of systems:  All other systems reviewed and found to be negative.  Physical Exam: There were no vitals taken for this visit. GENERAL:  Well developed, well nourished, **man sitting comfortably in the exam room in no acute distress. MENTAL STATUS:  Alert and oriented to person, place and time. HEAD:  *** hair.  Normocephalic, atraumatic, face symmetric, no Cushingoid features. EYES:  *** eyes.  Pupils equal round and reactive to light and accomodation.  No conjunctivitis or scleral icterus. ENT:  Oropharynx clear without lesion.  Tongue normal. Mucous membranes moist.  RESPIRATORY:  Clear to auscultation without rales, wheezes or rhonchi. CARDIOVASCULAR:  Regular rate and rhythm without murmur, rub or gallop. ABDOMEN:  Soft, non-tender, with active bowel sounds, and no hepatosplenomegaly.  No masses. SKIN:  No rashes, ulcers or lesions. EXTREMITIES: No edema, no skin discoloration or tenderness.  No palpable cords. LYMPH NODES: No palpable cervical, supraclavicular, axillary or inguinal adenopathy  NEUROLOGICAL: Unremarkable. PSYCH:  Appropriate.   No visits with results within 3 Day(s) from this visit.  Latest known visit with results is:  Office Visit on 11/02/2017  Component Date Value Ref Range Status  . Glucose 11/02/2017 95  65 - 99 mg/dL Final  . BUN 11/02/2017 14  8 - 27 mg/dL Final  . Creatinine, Ser 11/02/2017 0.78  0.57 - 1.00 mg/dL Final  . GFR calc non Af Amer 11/02/2017 71  >59 mL/min/1.73 Final  . GFR calc Af Amer 11/02/2017 82  >59 mL/min/1.73  Final  . BUN/Creatinine Ratio 11/02/2017 18  12 - 28 Final  . Sodium 11/02/2017 144  134 - 144 mmol/L Final  . Potassium 11/02/2017 4.1  3.5 - 5.2 mmol/L Final  . Chloride 11/02/2017 98  96 - 106 mmol/L Final  . CO2 11/02/2017 26  20 - 29 mmol/L Final  . Calcium 11/02/2017 9.5  8.7 - 10.3 mg/dL Final  . Phosphorus 11/02/2017 3.8  2.5 - 4.5 mg/dL Final  . Albumin 11/02/2017 4.4  3.5 - 4.7 g/dL Final  . Cholesterol, Total 11/02/2017 212* 100 - 199 mg/dL Final  . Triglycerides 11/02/2017 183* 0 - 149 mg/dL Final  . HDL 11/02/2017 44  >39 mg/dL Final  . VLDL Cholesterol Cal 11/02/2017 37  5 - 40 mg/dL Final  . LDL Calculated 11/02/2017 131* 0 - 99 mg/dL Final  . Chol/HDL Ratio 11/02/2017 4.8*  0.0 - 4.4 ratio Final   Comment:                                   T. Chol/HDL Ratio                                             Men  Women                               1/2 Avg.Risk  3.4    3.3                                   Avg.Risk  5.0    4.4                                2X Avg.Risk  9.6    7.1                                3X Avg.Risk 23.4   11.0   . Uric Acid 11/02/2017 4.4  2.5 - 7.1 mg/dL Final              Therapeutic target for gout patients: <6.0    Assessment:  MICHAEL VENTRESCA is a 85 y.o. female with stage IA right breast cancer s/p wide excision on 05/06/2010.  Pathology revealed a 0.8 cm invasive ductal carcinoma with focal low grade DCIS.  Tumor was ER + (> 90%), PR + (70%), and Her2/ne -.  She underwent sentinel lymph node biopsy on 05/23/2010.  Three lymph nodes were negative.  Pathologic stage was T1bN0.  She received 5 years of anastrazole (completed 06/2015).  Bilateral screening mammogram on 06/03/2018 revealed no evidence of malignancy.  Bone density on 01/03/2018 revealed osteopenia with a T-score of -1.5 in the right femoral neck and -1.1 in AP spine L1-L4.  She is on calcium and vitamin D.  Symptomatically,  Plan: 1.   Labs today:  CBc with diff, CMP,  CA27.29.  2.   Stage IA right breast cancer  Review entire medical history, diagnosis and management of breast cancer.  Review mammogram on 06/03/2018.  Continue yearly screening bilateral mammogram. 3.   Osteopenia  4.    Lequita Asal, MD  06/29/2018, 4:18 AM   I saw and evaluated the patient, participating in the key portions of the service and reviewing pertinent diagnostic studies and records.  I reviewed the nurse practitioner's note and agree with the findings and the plan.  The assessment and plan were discussed with the patient.  Additional diagnostic studies of *** are needed to clarify *** and would change the clinical management.  A few ***multiple questions were asked by the patient and answered.   Nolon Stalls, MD 06/29/2018,4:18 AM

## 2018-07-01 ENCOUNTER — Ambulatory Visit: Payer: Medicare Other | Admitting: Oncology

## 2018-07-08 ENCOUNTER — Ambulatory Visit: Payer: Medicare Other | Admitting: Hematology and Oncology

## 2018-07-21 ENCOUNTER — Other Ambulatory Visit: Payer: Self-pay | Admitting: Family Medicine

## 2018-07-21 DIAGNOSIS — K219 Gastro-esophageal reflux disease without esophagitis: Secondary | ICD-10-CM

## 2018-07-21 DIAGNOSIS — E7849 Other hyperlipidemia: Secondary | ICD-10-CM

## 2018-07-21 DIAGNOSIS — I10 Essential (primary) hypertension: Secondary | ICD-10-CM

## 2018-07-26 ENCOUNTER — Encounter: Payer: Self-pay | Admitting: Family Medicine

## 2018-07-26 ENCOUNTER — Other Ambulatory Visit: Payer: Self-pay

## 2018-07-26 ENCOUNTER — Ambulatory Visit (INDEPENDENT_AMBULATORY_CARE_PROVIDER_SITE_OTHER): Payer: Medicare Other | Admitting: Family Medicine

## 2018-07-26 VITALS — BP 120/70 | HR 64 | Ht 63.0 in | Wt 245.0 lb

## 2018-07-26 DIAGNOSIS — J4520 Mild intermittent asthma, uncomplicated: Secondary | ICD-10-CM

## 2018-07-26 DIAGNOSIS — F3341 Major depressive disorder, recurrent, in partial remission: Secondary | ICD-10-CM

## 2018-07-26 DIAGNOSIS — K219 Gastro-esophageal reflux disease without esophagitis: Secondary | ICD-10-CM

## 2018-07-26 DIAGNOSIS — Z23 Encounter for immunization: Secondary | ICD-10-CM

## 2018-07-26 DIAGNOSIS — E790 Hyperuricemia without signs of inflammatory arthritis and tophaceous disease: Secondary | ICD-10-CM

## 2018-07-26 DIAGNOSIS — I10 Essential (primary) hypertension: Secondary | ICD-10-CM

## 2018-07-26 DIAGNOSIS — R69 Illness, unspecified: Secondary | ICD-10-CM | POA: Diagnosis not present

## 2018-07-26 DIAGNOSIS — J0100 Acute maxillary sinusitis, unspecified: Secondary | ICD-10-CM

## 2018-07-26 DIAGNOSIS — E7849 Other hyperlipidemia: Secondary | ICD-10-CM

## 2018-07-26 MED ORDER — FLUTICASONE-SALMETEROL 100-50 MCG/DOSE IN AEPB: 1.0000 | INHALATION_SPRAY | Freq: Two times a day (BID) | RESPIRATORY_TRACT | 5 refills | Status: DC

## 2018-07-26 MED ORDER — AMOXICILLIN 500 MG PO CAPS: 500.0000 mg | ORAL_CAPSULE | Freq: Three times a day (TID) | ORAL | 0 refills | Status: DC

## 2018-07-26 MED ORDER — FUROSEMIDE 20 MG PO TABS: ORAL_TABLET | ORAL | 1 refills | Status: DC

## 2018-07-26 MED ORDER — SERTRALINE HCL 50 MG PO TABS: ORAL_TABLET | ORAL | 1 refills | Status: DC

## 2018-07-26 MED ORDER — ALBUTEROL SULFATE (2.5 MG/3ML) 0.083% IN NEBU: 2.5000 mg | INHALATION_SOLUTION | Freq: Four times a day (QID) | RESPIRATORY_TRACT | 12 refills | Status: DC | PRN

## 2018-07-26 MED ORDER — OMEPRAZOLE 20 MG PO CPDR: DELAYED_RELEASE_CAPSULE | ORAL | 1 refills | Status: DC

## 2018-07-26 MED ORDER — ALLOPURINOL 100 MG PO TABS
ORAL_TABLET | ORAL | 1 refills | Status: DC
Start: 2018-07-26 — End: 2019-04-11

## 2018-07-26 MED ORDER — ALBUTEROL SULFATE HFA 108 (90 BASE) MCG/ACT IN AERS: 2.0000 | INHALATION_SPRAY | Freq: Four times a day (QID) | RESPIRATORY_TRACT | 5 refills | Status: DC | PRN

## 2018-07-26 MED ORDER — LOSARTAN POTASSIUM 100 MG PO TABS: ORAL_TABLET | ORAL | 1 refills | Status: DC

## 2018-07-26 MED ORDER — LOVASTATIN 40 MG PO TABS: 40.0000 mg | ORAL_TABLET | Freq: Every day | ORAL | 1 refills | Status: DC

## 2018-07-26 NOTE — Progress Notes (Signed)
Date:  07/26/2018   Name:  Kirsten Snyder   DOB:  December 15, 1933   MRN:  [id number]   Chief Complaint: pneum 23; Depression (PHQ9=2); Edema (lower leg swelling- takes lasix for this); COPD; Hypertension; Hyperlipidemia; Gastroesophageal Reflux; and Gout  Depression         This is a chronic problem.  The current episode started more than 1 year ago.   The onset quality is sudden.   The problem has been gradually improving since onset.  Associated symptoms include no decreased concentration, no fatigue, no helplessness, no hopelessness, does not have insomnia, not irritable, no restlessness, no decreased interest, no appetite change, no body aches, no myalgias, no headaches, no indigestion, not sad and no suicidal ideas.     The symptoms are aggravated by nothing.  Past treatments include SSRIs - Selective serotonin reuptake inhibitors.  Compliance with treatment is good.  Previous treatment provided moderate relief.   Pertinent negatives include no hypothyroidism and no anxiety. COPD  There is no chest tightness, cough, difficulty breathing, frequent throat clearing, hemoptysis, hoarse voice, shortness of breath, sputum production or wheezing. This is a chronic problem. The current episode started more than 1 year ago. The problem occurs intermittently. The problem has been gradually worsening. Pertinent negatives include no appetite change, chest pain, dyspnea on exertion, ear congestion, ear pain, fever, headaches, heartburn, malaise/fatigue, myalgias, nasal congestion, orthopnea, PND, postnasal drip, rhinorrhea, sneezing, sore throat, sweats, trouble swallowing or weight loss. Her symptoms are aggravated by nothing. Her symptoms are alleviated by nothing. She reports moderate improvement on treatment. Her symptoms are not alleviated by steroid inhaler and beta-agonist. There are no known risk factors for lung disease. Her past medical history is significant for COPD. There is no history of asthma,  bronchiectasis, bronchitis, emphysema or pneumonia.  Hypertension  The current episode started in the past 7 days. The problem has been waxing and waning since onset. The problem is controlled. Pertinent negatives include no anxiety, blurred vision, chest pain, headaches, malaise/fatigue, neck pain, orthopnea, palpitations, peripheral edema, PND, shortness of breath or sweats. There are no associated agents to hypertension. Risk factors for coronary artery disease include obesity, dyslipidemia and post-menopausal state. Past treatments include angiotensin blockers and diuretics. The current treatment provides moderate improvement. There are no compliance problems.  There is no history of angina, kidney disease, CAD/MI, CVA, heart failure, left ventricular hypertrophy, PVD or retinopathy. There is no history of chronic renal disease, a hypertension causing med or renovascular disease.  Hyperlipidemia  This is a chronic problem. The current episode started more than 1 year ago. The problem is uncontrolled. Recent lipid tests were reviewed and are normal. She has no history of chronic renal disease, diabetes, hypothyroidism, liver disease, obesity or nephrotic syndrome. There are no known factors aggravating her hyperlipidemia. Pertinent negatives include no chest pain, focal sensory loss, focal weakness, leg pain, myalgias or shortness of breath. She is currently on no antihyperlipidemic treatment. The current treatment provides no improvement of lipids. There are no compliance problems.  Risk factors for coronary artery disease include dyslipidemia, hypertension and post-menopausal.  Gastroesophageal Reflux  She reports no abdominal pain, no chest pain, no choking, no coughing, no heartburn, no hoarse voice, no nausea, no sore throat or no wheezing. Pertinent negatives include no fatigue, orthopnea or weight loss.  Sinusitis  This is a new problem. The current episode started more than 1 year ago. The problem  has been gradually worsening since onset. There has been  no fever. Associated symptoms include congestion and sinus pressure. Pertinent negatives include no chills, coughing, diaphoresis, ear pain, headaches, hoarse voice, neck pain, shortness of breath, sneezing, sore throat or swollen glands.    Review of Systems  Constitutional: Negative.  Negative for appetite change, chills, diaphoresis, fatigue, fever, malaise/fatigue, unexpected weight change and weight loss.  HENT: Positive for congestion and sinus pressure. Negative for ear discharge, ear pain, hoarse voice, postnasal drip, rhinorrhea, sneezing, sore throat and trouble swallowing.   Eyes: Negative for blurred vision, photophobia, pain, discharge, redness and itching.  Respiratory: Negative for cough, hemoptysis, sputum production, choking, shortness of breath, wheezing and stridor.   Cardiovascular: Negative for chest pain, dyspnea on exertion, palpitations, orthopnea and PND.  Gastrointestinal: Negative for abdominal pain, blood in stool, constipation, diarrhea, heartburn, nausea and vomiting.  Endocrine: Negative for cold intolerance, heat intolerance, polydipsia, polyphagia and polyuria.  Genitourinary: Negative for dysuria, flank pain, frequency, hematuria, menstrual problem, pelvic pain, urgency, vaginal bleeding and vaginal discharge.  Musculoskeletal: Negative for arthralgias, back pain, myalgias and neck pain.  Skin: Negative for rash.  Allergic/Immunologic: Negative for environmental allergies and food allergies.  Neurological: Negative for dizziness, focal weakness, weakness, light-headedness, numbness and headaches.  Hematological: Negative for adenopathy. Does not bruise/bleed easily.  Psychiatric/Behavioral: Positive for depression. Negative for decreased concentration, dysphoric mood and suicidal ideas. The patient is not nervous/anxious and does not have insomnia.     Patient Active Problem List   Diagnosis Date Noted    Osteopenia of neck of right femur 06/29/2018   Breast cancer, right ([place]) 06/17/2016   Essential hypertension 12/13/2015   Breast abscess of female 11/07/2015   Familial multiple lipoprotein-type hyperlipidemia 10/25/2014   Clinical depression 10/25/2014   Acid reflux 10/25/2014   Acute gastrointestinal bleeding 10/25/2014   Aggrieved 10/25/2014   Airway hyperreactivity 10/25/2014   Arthritis of knee 10/25/2014   H/O: gout 10/25/2014   Adiposity 10/25/2014   Arthritis, degenerative 10/25/2014   H/O adenomatous polyp of colon 03/07/2014    Allergies  Allergen Reactions   Sulfa Antibiotics     Past Surgical History:  Procedure Laterality Date   BREAST BIOPSY Bilateral    negative   BREAST EXCISIONAL BIOPSY Right    right positive 04/2010   BREAST LUMPECTOMY Right    removed a cancerous lump   VAGINAL HYSTERECTOMY      Social History   Tobacco Use   Smoking status: Former Smoker    Packs/day: 0.25    Years: 2.00    Pack years: 0.50    Types: Cigarettes   Smokeless tobacco: Never Used   Tobacco comment: smoking cessation materials not required  Substance Use Topics   Alcohol use: No    Alcohol/week: 0.0 standard drinks   Drug use: No     Medication list has been reviewed and updated.  Current Meds  Medication Sig   acetaminophen (TYLENOL) 325 MG tablet Take by mouth.   albuterol (PROVENTIL HFA;VENTOLIN HFA) 108 (90 Base) MCG/ACT inhaler Inhale 2 puffs into the lungs every 6 (six) hours as needed for wheezing or shortness of breath.   albuterol (PROVENTIL) (2.5 MG/3ML) 0.083% nebulizer solution Take 3 mLs (2.5 mg total) by nebulization every 6 (six) hours as needed for wheezing or shortness of breath.   allopurinol (ZYLOPRIM) 100 MG tablet 1 tablet by mouth daily   aspirin 81 MG tablet Take 81 mg by mouth daily.   Fluticasone-Salmeterol (ADVAIR DISKUS) 100-50 MCG/DOSE AEPB Inhale 1 puff into the lungs 2 (two)  times daily.    furosemide (LASIX) 20 MG tablet TAKE ONE (1) TABLET BY MOUTH ONCE DAILY   loratadine (CLARITIN) 10 MG tablet TAKE (1) TABLET BY MOUTH EVERY DAY   losartan (COZAAR) 100 MG tablet TAKE (1) TABLET BY MOUTH EVERY DAY   lovastatin (MEVACOR) 40 MG tablet TAKE ONE TABLET BY MOUTH AT BEDTIME.   omeprazole (PRILOSEC) 20 MG capsule TAKE ONE (1) CAPSULE EACH DAY.   sertraline (ZOLOFT) 50 MG tablet TAKE ONE (1) TABLET BY MOUTH ONCE DAILY    PHQ 2/9 Scores 07/26/2018 12/13/2017 11/02/2017 09/03/2017  PHQ - 2 Score 0 0 0 0  PHQ- 9 Score 2 0 3 3    BP Readings from Last 3 Encounters:  07/26/18 120/70  12/13/17 110/64  11/02/17 120/70    Physical Exam Vitals signs and nursing note reviewed.  Constitutional:      General: She is not irritable.She is not in acute distress.    Appearance: She is not diaphoretic.  HENT:     Head: Normocephalic and atraumatic.     Right Ear: Hearing, tympanic membrane, ear canal and external ear normal.     Left Ear: Hearing, tympanic membrane, ear canal and external ear normal.     Nose: No congestion or rhinorrhea.     Right Sinus: No maxillary sinus tenderness or frontal sinus tenderness.     Left Sinus: Maxillary sinus tenderness present. No frontal sinus tenderness.     Mouth/Throat:     Lips: Pink.     Mouth: Mucous membranes are moist.  Eyes:     General:        Right eye: No discharge.        Left eye: No discharge.     Conjunctiva/sclera: Conjunctivae normal.     Pupils: Pupils are equal, round, and reactive to light.  Neck:     Musculoskeletal: Normal range of motion and neck supple. No neck rigidity or muscular tenderness.     Thyroid: No thyromegaly.     Vascular: No carotid bruit or JVD.  Cardiovascular:     Rate and Rhythm: Normal rate and regular rhythm.     Pulses: Normal pulses.     Heart sounds: Normal heart sounds. No murmur. No friction rub. No gallop.   Pulmonary:     Effort: Pulmonary effort is normal. No respiratory distress.      Breath sounds: Normal breath sounds. No stridor. No wheezing, rhonchi or rales.  Abdominal:     General: Abdomen is flat. Bowel sounds are normal.     Palpations: Abdomen is soft. There is no mass.     Tenderness: There is no abdominal tenderness. There is no guarding or rebound.     Hernia: No hernia is present.  Musculoskeletal: Normal range of motion.  Lymphadenopathy:     Cervical: No cervical adenopathy.  Skin:    General: Skin is warm and dry.  Neurological:     Mental Status: She is alert.     Deep Tendon Reflexes: Reflexes are normal and symmetric.     Wt Readings from Last 3 Encounters:  07/26/18 245 lb (111.1 kg)  12/13/17 246 lb (111.6 kg)  11/02/17 242 lb (109.8 kg)    BP 120/70    Pulse 64    Ht 5\' 3"  (1.6 m)    Wt 245 lb (111.1 kg)    BMI 43.40 kg/m   Assessment and Plan: 1. Essential hypertension Chronic.  Controlled.  Continue furosemide 20 mg once a  day and losartan 100 mg once a day will check a renal function panel. - furosemide (LASIX) 20 MG tablet; TAKE ONE (1) TABLET BY MOUTH ONCE DAILY  Dispense: 90 tablet; Refill: 1 - losartan (COZAAR) 100 MG tablet; One tablet daily  Dispense: 90 tablet; Refill: 1 - Lipid Panel With LDL/HDL Ratio - Renal Function Panel  2. Mild intermittent asthma without complication Chronic.  Controlled.  Will continue albuterol inhaler 2 puffs every 6 hours - albuterol (PROVENTIL HFA;VENTOLIN HFA) 108 (90 Base) MCG/ACT inhaler; Inhale 2 puffs into the lungs every 6 (six) hours as needed for wheezing or shortness of breath.  Dispense: 1 Inhaler; Refill: 5 - albuterol (PROVENTIL) (2.5 MG/3ML) 0.083% nebulizer solution; Take 3 mLs (2.5 mg total) by nebulization every 6 (six) hours as needed for wheezing or shortness of breath.  Dispense: 75 mL; Refill: 12 - Fluticasone-Salmeterol (ADVAIR DISKUS) 100-50 MCG/DOSE AEPB; Inhale 1 puff into the lungs 2 (two) times daily.  Dispense: 1 each; Refill: 5 worsening episodes will substitute  albuterol utilization.  Continue Advair discus.  3. Hyperuricemia Patient with history of gout and elevated uric acid will continue allopurinol and check renal function determine continue once. - allopurinol (ZYLOPRIM) 100 MG tablet; 1 tablet by mouth daily  Dispense: 90 tablet; Refill: 1 - Uric acid  4. Familial multiple lipoprotein-type hyperlipidemia .  Controlled.  Continue lovastatin 40 mg nightly and will check lipid panel for evaluation of control. - lovastatin (MEVACOR) 40 MG tablet; Take 1 tablet (40 mg total) by mouth at bedtime.  Dispense: 90 tablet; Refill: 1 - Lipid Panel With LDL/HDL Ratio  5. Gastroesophageal reflux disease, esophagitis presence not specified Controlled.  Chronic.  Continue omeprazole 20 mg once a day. - omeprazole (PRILOSEC) 20 MG capsule; TAKE ONE (1) CAPSULE EACH DAY.  Dispense: 90 capsule; Refill: 1  6. Recurrent major depressive disorder, in partial remission (HCC) Chronic.  Controlled.  Continue sertraline 50 mg once a day. - sertraline (ZOLOFT) 50 MG tablet; TAKE ONE (1) TABLET BY MOUTH ONCE DAILY  Dispense: 90 tablet; Refill: 1  7. Taking medication for chronic disease Patient is taking the statin for which we will check hepatic function panel to access for hepatotoxicity. - Hepatic function panel  8. Acute maxillary sinusitis, recurrence not specified New onset.  Acute.  Persistent.  Patient has nasal discharge with blood from left passage.  Patient has tenderness particularly of the left maxillary.  Will initiate amoxicillin 500 mg 3 times a day for 10 days - amoxicillin (AMOXIL) 500 MG capsule; Take 1 capsule (500 mg total) by mouth 3 (three) times daily.  Dispense: 30 capsule; Refill: 0  9. Need for pneumococcal vaccination Discussed and administered. - Pneumococcal polysaccharide vaccine 23-valent greater than or equal to 2yo subcutaneous/IM

## 2018-07-26 NOTE — Patient Instructions (Signed)
This information is directly available on the CDC website: https://www.cdc.gov/coronavirus/2019-ncov/if-you-are-sick/steps-when-sick.html    Source:CDC Reference to specific commercial products, manufacturers, companies, or trademarks does not constitute its endorsement or recommendation by the U.S. Government, Department of Health and Human Services, or Centers for Disease Control and Prevention.  

## 2018-07-27 LAB — HEPATIC FUNCTION PANEL
ALT: 12 IU/L (ref 0–32)
AST: 14 IU/L (ref 0–40)
Alkaline Phosphatase: 96 IU/L (ref 39–117)
Bilirubin Total: 0.6 mg/dL (ref 0.0–1.2)
Bilirubin, Direct: 0.14 mg/dL (ref 0.00–0.40)
Total Protein: 6.4 g/dL (ref 6.0–8.5)

## 2018-07-27 LAB — RENAL FUNCTION PANEL
Albumin: 4.4 g/dL (ref 3.6–4.6)
BUN/Creatinine Ratio: 24 (ref 12–28)
BUN: 18 mg/dL (ref 8–27)
CO2: 29 mmol/L (ref 20–29)
Calcium: 9.8 mg/dL (ref 8.7–10.3)
Chloride: 97 mmol/L (ref 96–106)
Creatinine, Ser: 0.75 mg/dL (ref 0.57–1.00)
GFR calc Af Amer: 85 mL/min/{1.73_m2} (ref >59)
GFR calc non Af Amer: 74 mL/min/{1.73_m2} (ref >59)
Glucose: 117 mg/dL — ABNORMAL HIGH (ref 65–99)
Phosphorus: 3.5 mg/dL (ref 3.0–4.3)
Potassium: 4.3 mmol/L (ref 3.5–5.2)
Sodium: 143 mmol/L (ref 134–144)

## 2018-07-27 LAB — LIPID PANEL WITH LDL/HDL RATIO
Cholesterol, Total: 201 mg/dL — ABNORMAL HIGH (ref 100–199)
HDL: 39 mg/dL — ABNORMAL LOW (ref >39)
LDL Calculated: 110 mg/dL — ABNORMAL HIGH (ref 0–99)
LDl/HDL Ratio: 2.8 ratio (ref 0.0–3.2)
Triglycerides: 262 mg/dL — ABNORMAL HIGH (ref 0–149)
VLDL Cholesterol Cal: 52 mg/dL — ABNORMAL HIGH (ref 5–40)

## 2018-07-27 LAB — URIC ACID: Uric Acid: 4.7 mg/dL (ref 2.5–7.1)

## 2018-08-02 ENCOUNTER — Other Ambulatory Visit: Payer: Self-pay

## 2018-08-03 ENCOUNTER — Inpatient Hospital Stay: Payer: Medicare Other | Attending: Oncology | Admitting: Hematology and Oncology

## 2018-08-03 ENCOUNTER — Encounter: Payer: Self-pay | Admitting: Hematology and Oncology

## 2018-08-03 DIAGNOSIS — M85851 Other specified disorders of bone density and structure, right thigh: Secondary | ICD-10-CM | POA: Diagnosis not present

## 2018-08-03 DIAGNOSIS — Z170 Estrogen receptor positive status [ER+]: Secondary | ICD-10-CM | POA: Diagnosis not present

## 2018-08-03 DIAGNOSIS — B372 Candidiasis of skin and nail: Secondary | ICD-10-CM | POA: Diagnosis not present

## 2018-08-03 DIAGNOSIS — B369 Superficial mycosis, unspecified: Secondary | ICD-10-CM

## 2018-08-03 DIAGNOSIS — C50911 Malignant neoplasm of unspecified site of right female breast: Secondary | ICD-10-CM

## 2018-08-03 MED ORDER — NYSTATIN 100000 UNIT/GM EX POWD: Freq: Three times a day (TID) | CUTANEOUS | 0 refills | Status: DC

## 2018-08-03 NOTE — Progress Notes (Signed)
[facility]  [address], [address] [place], [place] [address] Phone: [phone]  Fax: [phone]   Telemedicine Office Visit:  08/03/2018  Referring physician: [name], MD  I connected with Kirsten Snyder on 08/03/18 at 4:01 PM by videoconferencing and verified that I was speaking with the correct person using 2 identifiers.  The patient was at home.  I discussed the limitations, risk, security and privacy concerns of performing an evaluation and management service by videoconferencing and the availability of in person appointments.  I also discussed with the patient that there may be a patient responsible charge related to this service.  The patient expressed understanding and agreed to proceed.   Chief Complaint: Kirsten Snyder is a 85 y.o. female with stage IA right breast cancer who is seen for new patient assessment.  HPI:  The patient states that her breast cancer was found on physical exam.  Right breast lumpectomy with needle localization on 05/06/2010 by Dr Rochel Brome revealed a 0.8 cm grade I invasive ductal carcinoma with focal DCIS.  Tumor was ER + > 90%, PR+ 70% and HER-2/neu negative.  Pathologic stage was T1bNx.  Three sentinel lymph nodes on 05/23/2010 were negative for carcinoma.  Oncotype DX score was low risk (score 14).  She received radiation.  She completed 5 years of Arimidex in 06/2015.   The patient was last seen in the medical oncology clinic by Dr. Delight Hoh on 06/18/2017.  At that time  She denied any complaints.  Bone density on 01/03/2018 revealed stable osteopenia with a T score of -1.5 in the right femoral neck and -1.1 in the AP spine L1-L4.  She is on calcium and vitamin D.  Bilateral screening mammogram on 06/03/2018 revealed no evidence of malignancy.  Symptomatically, denies any breast concerns.  She performs self exams monthly.  She feels fine except for the area redness under her breasts which is  "smelly".  She confirms that this is in an area of moistness.  She has had this before.  She is using baby powder on the right side.   Past Medical History:  Diagnosis Date  . Allergy   . Asthma   . Cancer [facility]) 2012   right breast cancer lumpectomy with rad tx  . Depression   . Edema   . GERD (gastroesophageal reflux disease)   . Gout   . Hyperlipidemia   . Hypertension   . Personal history of radiation therapy     Past Surgical History:  Procedure Laterality Date  . BREAST BIOPSY Bilateral    negative  . BREAST EXCISIONAL BIOPSY Right    right positive 04/2010  . BREAST LUMPECTOMY Right    removed a cancerous lump  . VAGINAL HYSTERECTOMY      Family History  Problem Relation Age of Onset  . Leukemia Mother   . Heart disease Father   . Breast cancer Neg Hx     Social History:  reports that she has quit smoking. Her smoking use included cigarettes. She has a 0.50 pack-year smoking history. She has never used smokeless tobacco. She reports that she does not drink alcohol or use drugs.    She lives in [place].  Participants in the patient's visit and their role in the encounter included the patient and Vito Berger, CMA, today.  The intake visit was provided by Vito Berger, CMA.  Allergies:  Allergies  Allergen Reactions  . Sulfa Antibiotics     Current Medications:  Current Outpatient Medications  Medication Sig Dispense Refill  . albuterol (PROVENTIL) (2.5 MG/3ML) 0.083% nebulizer solution Take 3 mLs (2.5 mg total) by nebulization every 6 (six) hours as needed for wheezing or shortness of breath. 75 mL 12  . allopurinol (ZYLOPRIM) 100 MG tablet 1 tablet by mouth daily 90 tablet 1  . amoxicillin (AMOXIL) 500 MG capsule Take 1 capsule (500 mg total) by mouth 3 (three) times daily. 30 capsule 0  . aspirin 81 MG tablet Take 81 mg by mouth daily.    . Fluticasone-Salmeterol (ADVAIR DISKUS) 100-50 MCG/DOSE AEPB Inhale 1 puff into the lungs 2 (two) times daily. 1  each 5  . furosemide (LASIX) 20 MG tablet TAKE ONE (1) TABLET BY MOUTH ONCE DAILY 90 tablet 1  . loratadine (CLARITIN) 10 MG tablet TAKE (1) TABLET BY MOUTH EVERY DAY 90 tablet 1  . losartan (COZAAR) 100 MG tablet One tablet daily 90 tablet 1  . lovastatin (MEVACOR) 40 MG tablet Take 1 tablet (40 mg total) by mouth at bedtime. 90 tablet 1  . omeprazole (PRILOSEC) 20 MG capsule TAKE ONE (1) CAPSULE EACH DAY. 90 capsule 1  . sertraline (ZOLOFT) 50 MG tablet TAKE ONE (1) TABLET BY MOUTH ONCE DAILY 90 tablet 1  . acetaminophen (TYLENOL) 325 MG tablet Take by mouth.    Marland Kitchen albuterol (PROVENTIL HFA;VENTOLIN HFA) 108 (90 Base) MCG/ACT inhaler Inhale 2 puffs into the lungs every 6 (six) hours as needed for wheezing or shortness of breath. (Patient not taking: Reported on 08/03/2018) 1 Inhaler 5  . nystatin cream (MYCOSTATIN) Apply 1 application topically as needed. (Patient not taking: Reported on 07/26/2018) 30 g 1   No current facility-administered medications for this visit.     Review of Systems:  GENERAL:  Feels "fine".  No fevers, sweats or weight loss. PERFORMANCE STATUS (ECOG):  1 HEENT:  No visual changes, runny nose, sore throat, mouth sores or tenderness. Lungs: No shortness of breath with exertion.  Asthma, uses MDI.  Chronic cough.  No hemoptysis. Cardiac:  No chest pain, palpitations, orthopnea, or PND. GI:  No nausea, vomiting, diarrhea, constipation, melena or hematochezia. GU:  No urgency, frequency, dysuria, or hematuria. Musculoskeletal:  "Doesn't walk well" (chronic).  No back pain.  No joint pain.  No muscle tenderness. Extremities:  No pain or swelling. Skin:  Red smelly area under right breast (notes history of similar rash in groin area).  Neuro:  No headache, numbness or weakness, balance or coordination issues. Endocrine:  No diabetes, thyroid issues, hot flashes or night sweats. Psych:  No mood changes, depression or anxiety. Pain:  No focal pain. Review of systems:  All  other systems reviewed and found to be negative.  Physical Exam: There were no vitals taken for this visit. GENERAL:  Well developed, well nourished, woman sitting comfortably at home in no acute distress. MENTAL STATUS:  Alert and oriented to person, place and time. HEAD:  Short light brown/blonde hair.  Normocephalic, atraumatic, face full and symmetric, no Cushingoid features. EYES:  Glasses.  Blue eyes.  No conjunctivitis or scleral icterus. NEUROLOGICAL: Unremarkable. PSYCH:  Appropriate.    No visits with results within 3 Day(s) from this visit.  Latest known visit with results is:  Office Visit on 07/26/2018  Component Date Value Ref Range Status  . Cholesterol, Total 07/26/2018 201* 100 - 199 mg/dL Final  . Triglycerides 07/26/2018 262* 0 - 149 mg/dL Final  . HDL 07/26/2018 39* >39 mg/dL Final  . VLDL Cholesterol  Cal 07/26/2018 52* 5 - 40 mg/dL Final  . LDL Calculated 07/26/2018 110* 0 - 99 mg/dL Final  . LDl/HDL Ratio 07/26/2018 2.8  0.0 - 3.2 ratio Final   Comment:                                     LDL/HDL Ratio                                             Men  Women                               1/2 Avg.Risk  1.0    1.5                                   Avg.Risk  3.6    3.2                                2X Avg.Risk  6.2    5.0                                3X Avg.Risk  8.0    6.1   . Total Protein 07/26/2018 6.4  6.0 - 8.5 g/dL Final  . Bilirubin Total 07/26/2018 0.6  0.0 - 1.2 mg/dL Final  . Bilirubin, Direct 07/26/2018 0.14  0.00 - 0.40 mg/dL Final  . Alkaline Phosphatase 07/26/2018 96  39 - 117 IU/L Final  . AST 07/26/2018 14  0 - 40 IU/L Final  . ALT 07/26/2018 12  0 - 32 IU/L Final  . Glucose 07/26/2018 117* 65 - 99 mg/dL Final  . BUN 07/26/2018 18  8 - 27 mg/dL Final  . Creatinine, Ser 07/26/2018 0.75  0.57 - 1.00 mg/dL Final  . GFR calc non Af Amer 07/26/2018 74  >59 mL/min/1.73 Final  . GFR calc Af Amer 07/26/2018 85  >59 mL/min/1.73 Final  .  BUN/Creatinine Ratio 07/26/2018 24  12 - 28 Final  . Sodium 07/26/2018 143  134 - 144 mmol/L Final  . Potassium 07/26/2018 4.3  3.5 - 5.2 mmol/L Final  . Chloride 07/26/2018 97  96 - 106 mmol/L Final  . CO2 07/26/2018 29  20 - 29 mmol/L Final  . Calcium 07/26/2018 9.8  8.7 - 10.3 mg/dL Final  . Phosphorus 07/26/2018 3.5  3.0 - 4.3 mg/dL Final  . Albumin 07/26/2018 4.4  3.6 - 4.6 g/dL Final  . Uric Acid 07/26/2018 4.7  2.5 - 7.1 mg/dL Final              Therapeutic target for gout patients: <6.0    Assessment:  Kirsten Snyder is a 85 y.o. female with stage IA right breast cancer s/p lumpectomy with needle localization on 05/06/2010 revealed a 0.8 cm grade I invasive ductal carcinoma with focal DCIS.  Tumor was ER + > 90%, PR+ 70% and HER-2/neu negative.  Three sentinel lymph nodes on 05/23/2010 were negative for carcinoma.  Pathologic stage was T1bNx.  Oncotype DX score was low risk (score 14).  She received radiation.  She completed 5 years of Arimidex in 06/2015.   Bilateral screening mammogram on 06/03/2018 revealed no evidence of malignancy.  Bone density on 01/03/2018 revealed osteopenia with a T score of -1.5 in the right femoral neck and -1.1 in the AP spine L1-L4.  She is on calcium and vitamin D.  Symptomatically, she denies any breast concerns.  She notes an area redness under her breasts which is "smelly".  Area is moist and is likely due to a Candidal skin infection.  Plan: 1.   Discuss entire medical history, diagnoses and management for breast cancer.   2.   Review labs from 04/10-2018- CMP only.  3.   Stage IA right breast cancer  Clinically, she is doing well.    She is s/p wide excision followed by radiation and 5 years of Arimidex.  Mammogram on 06/03/2018 revealed no evidence of malignancy.  She denies any breast concerns.  Discuss breast exam in 6 months 4.   Fungal skin infection  By description, she appears to have a Candidal infection under her right breast.   Discuss importance of keeping this area dry.  Rx:  Nystatin topically TID.  RN to call patient in 1 week to assess rash.  If any concerns, patient will be seen for an exam.   5.   Osteopenia  Continue calcium and vitamin D.  Next bone density on 09/16/20201. 6.   Schedule bilateral mammogram on 06/05/2019. 7.   RTC in 6 months for MD assessment and labs (CBC with diff, CMP, CA27.29).   I discussed the assessment and treatment plan with the patient.  The patient was provided an opportunity to ask questions and all were answered.  The patient agreed with the plan and demonstrated an understanding of the instructions.  The patient was advised to call back or seek an in person evaluation if the symptoms worsen or if the condition fails to improve as anticipated.  I provided face-to-face video visit time during this this encounter and > 50% was spent counseling as documented under my assessment and plan.  I provided these services from the [facility] office.   Lequita Asal, MD, PhD  08/03/2018, 4:01 PM

## 2018-08-03 NOTE — Progress Notes (Signed)
No new changes noted today. The patient Name, DOB, and address has been verified by phone today. Cbg, CMA

## 2018-08-07 ENCOUNTER — Encounter: Payer: Self-pay | Admitting: Hematology and Oncology

## 2018-08-09 ENCOUNTER — Telehealth: Payer: Self-pay

## 2018-08-09 NOTE — Telephone Encounter (Signed)
Contacted patient to inquire on fungal infection that is present under right breast. Patient reports she has been using topical Nystatin cream and states "it's pretty much gone". Patient denies any other symptoms at this time. Advised to contact office should symptoms get worse. Patient verbalizes understanding and denies any further questions or concerns.

## 2018-08-19 ENCOUNTER — Other Ambulatory Visit: Payer: Self-pay

## 2018-08-19 ENCOUNTER — Ambulatory Visit
Admission: RE | Admit: 2018-08-19 | Discharge: 2018-08-19 | Disposition: A | Discharge status: Home / Self Care | Payer: Medicare Other | Source: Ambulatory Visit | Attending: Family Medicine | Admitting: Family Medicine

## 2018-08-19 ENCOUNTER — Ambulatory Visit (INDEPENDENT_AMBULATORY_CARE_PROVIDER_SITE_OTHER): Payer: Medicare Other | Admitting: Family Medicine

## 2018-08-19 ENCOUNTER — Encounter: Payer: Self-pay | Admitting: Family Medicine

## 2018-08-19 ENCOUNTER — Ambulatory Visit
Admission: RE | Admit: 2018-08-19 | Discharge: 2018-08-19 | Disposition: A | Discharge status: Home / Self Care | Payer: Medicare Other | Attending: Family Medicine | Admitting: Family Medicine

## 2018-08-19 VITALS — BP 110/62 | HR 72 | Temp 98.5°F | Ht 63.0 in | Wt 245.0 lb

## 2018-08-19 DIAGNOSIS — J45909 Unspecified asthma, uncomplicated: Secondary | ICD-10-CM | POA: Insufficient documentation

## 2018-08-19 DIAGNOSIS — J4520 Mild intermittent asthma, uncomplicated: Secondary | ICD-10-CM | POA: Diagnosis not present

## 2018-08-19 DIAGNOSIS — J209 Acute bronchitis, unspecified: Secondary | ICD-10-CM | POA: Insufficient documentation

## 2018-08-19 DIAGNOSIS — C50911 Malignant neoplasm of unspecified site of right female breast: Secondary | ICD-10-CM

## 2018-08-19 DIAGNOSIS — R05 Cough: Secondary | ICD-10-CM | POA: Diagnosis not present

## 2018-08-19 DIAGNOSIS — Z170 Estrogen receptor positive status [ER+]: Secondary | ICD-10-CM

## 2018-08-19 MED ORDER — BENZONATATE 100 MG PO CAPS: 100.0000 mg | ORAL_CAPSULE | Freq: Two times a day (BID) | ORAL | 0 refills | Status: DC | PRN

## 2018-08-19 MED ORDER — AZITHROMYCIN 250 MG PO TABS: ORAL_TABLET | ORAL | 0 refills | Status: DC

## 2018-08-19 NOTE — Progress Notes (Signed)
Date:  08/19/2018   Name:  Kirsten Snyder   DOB:  [birth year]   MRN:  [id number]   Chief Complaint: Shortness of Breath (had a round of Amox on 4/7-4/17- still has a cough and SOB)  Shortness of Breath  This is a new problem. The current episode started 1 to 4 weeks ago (worse over several weeks). The problem occurs every few minutes. The problem has been gradually worsening. Associated symptoms include wheezing. Pertinent negatives include no abdominal pain, chest pain, claudication, coryza, ear pain, fever, headaches, hemoptysis, leg pain, leg swelling, neck pain, orthopnea, PND, rash, rhinorrhea, sore throat, sputum production, swollen glands, syncope or vomiting. The symptoms are aggravated by any activity. She has tried beta agonist inhalers (antibiotic) for the symptoms. The treatment provided mild relief. Her past medical history is significant for asthma. There is no history of allergies, aspirin allergies, bronchiolitis, CAD, chronic lung disease, COPD, DVT, a heart failure, PE, pneumonia or a recent surgery.    Review of Systems  Constitutional: Negative.  Negative for chills, fatigue, fever and unexpected weight change.  HENT: Negative for congestion, dental problem, drooling, ear discharge, ear pain, facial swelling, hearing loss, mouth sores, nosebleeds, postnasal drip, rhinorrhea, sinus pressure, sinus pain, sneezing and sore throat.   Eyes: Negative for photophobia, pain, discharge, redness and itching.  Respiratory: Positive for cough, shortness of breath and wheezing. Negative for apnea, hemoptysis, sputum production, choking, chest tightness and stridor.   Cardiovascular: Negative for chest pain, palpitations, orthopnea, claudication, leg swelling, syncope and PND.  Gastrointestinal: Negative for abdominal pain, blood in stool, constipation, diarrhea, nausea and vomiting.  Endocrine: Negative for cold intolerance, heat intolerance, polydipsia, polyphagia and polyuria.   Genitourinary: Negative for dysuria, flank pain, frequency, hematuria, menstrual problem, pelvic pain, urgency, vaginal bleeding and vaginal discharge.  Musculoskeletal: Negative for arthralgias, back pain, myalgias and neck pain.  Skin: Negative for rash.  Allergic/Immunologic: Negative for environmental allergies and food allergies.  Neurological: Negative for dizziness, weakness, light-headedness, numbness and headaches.  Hematological: Negative for adenopathy. Does not bruise/bleed easily.  Psychiatric/Behavioral: Negative for dysphoric mood. The patient is not nervous/anxious.     Patient Active Problem List   Diagnosis Date Noted  . Osteopenia of neck of right femur 06/29/2018  . Breast cancer, right ([place]) 06/17/2016  . Essential hypertension 12/13/2015  . Breast abscess of female 11/07/2015  . Familial multiple lipoprotein-type hyperlipidemia 10/25/2014  . Clinical depression 10/25/2014  . Acid reflux 10/25/2014  . Acute gastrointestinal bleeding 10/25/2014  . Aggrieved 10/25/2014  . Airway hyperreactivity 10/25/2014  . Arthritis of knee 10/25/2014  . H/O: gout 10/25/2014  . Adiposity 10/25/2014  . Arthritis, degenerative 10/25/2014  . H/O adenomatous polyp of colon 03/07/2014    Allergies  Allergen Reactions  . Sulfa Antibiotics     Past Surgical History:  Procedure Laterality Date  . BREAST BIOPSY Bilateral    negative  . BREAST EXCISIONAL BIOPSY Right    right positive 04/2010  . BREAST LUMPECTOMY Right    removed a cancerous lump  . VAGINAL HYSTERECTOMY      Social History   Tobacco Use  . Smoking status: Former Smoker    Packs/day: 0.25    Years: 2.00    Pack years: 0.50    Types: Cigarettes  . Smokeless tobacco: Never Used  . Tobacco comment: smoking cessation materials not required  Substance Use Topics  . Alcohol use: No    Alcohol/week: 0.0 standard drinks  . Drug use: No  Medication list has been reviewed and updated.  Current Meds   Medication Sig  . acetaminophen (TYLENOL) 325 MG tablet Take by mouth.  Marland Kitchen albuterol (PROVENTIL HFA;VENTOLIN HFA) 108 (90 Base) MCG/ACT inhaler Inhale 2 puffs into the lungs every 6 (six) hours as needed for wheezing or shortness of breath.  Marland Kitchen albuterol (PROVENTIL) (2.5 MG/3ML) 0.083% nebulizer solution Take 3 mLs (2.5 mg total) by nebulization every 6 (six) hours as needed for wheezing or shortness of breath.  . allopurinol (ZYLOPRIM) 100 MG tablet 1 tablet by mouth daily  . aspirin 81 MG tablet Take 81 mg by mouth daily.  . Fluticasone-Salmeterol (ADVAIR DISKUS) 100-50 MCG/DOSE AEPB Inhale 1 puff into the lungs 2 (two) times daily.  . furosemide (LASIX) 20 MG tablet TAKE ONE (1) TABLET BY MOUTH ONCE DAILY  . loratadine (CLARITIN) 10 MG tablet TAKE (1) TABLET BY MOUTH EVERY DAY  . losartan (COZAAR) 100 MG tablet One tablet daily  . lovastatin (MEVACOR) 40 MG tablet Take 1 tablet (40 mg total) by mouth at bedtime.  Marland Kitchen nystatin (MYCOSTATIN/NYSTOP) powder Apply topically 3 (three) times daily.  Marland Kitchen omeprazole (PRILOSEC) 20 MG capsule TAKE ONE (1) CAPSULE EACH DAY.  . sertraline (ZOLOFT) 50 MG tablet TAKE ONE (1) TABLET BY MOUTH ONCE DAILY    PHQ 2/9 Scores 07/26/2018 12/13/2017 11/02/2017 09/03/2017  PHQ - 2 Score 0 0 0 0  PHQ- 9 Score 2 0 3 3    BP Readings from Last 3 Encounters:  08/19/18 110/62  07/26/18 120/70  12/13/17 110/64    Physical Exam Vitals signs and nursing note reviewed.  Constitutional:      General: She is not in acute distress.    Appearance: She is obese. She is not diaphoretic.  HENT:     Head: Normocephalic and atraumatic.     Right Ear: External ear normal.     Left Ear: External ear normal.     Nose: Nose normal.     Mouth/Throat:     Mouth: Mucous membranes are moist.  Eyes:     General:        Right eye: No discharge.        Left eye: No discharge.     Conjunctiva/sclera: Conjunctivae normal.     Pupils: Pupils are equal, round, and reactive to light.   Neck:     Musculoskeletal: Normal range of motion and neck supple.     Thyroid: No thyromegaly.     Vascular: No hepatojugular reflux or JVD.  Cardiovascular:     Rate and Rhythm: Normal rate and regular rhythm.  No extrasystoles are present.    Pulses: Normal pulses. No decreased pulses.     Heart sounds: Normal heart sounds, S1 normal and S2 normal. No murmur. No systolic murmur. No diastolic murmur. No friction rub. No gallop. No S3 or S4 sounds.   Pulmonary:     Effort: Pulmonary effort is normal. No accessory muscle usage or respiratory distress.     Breath sounds: Normal breath sounds. No decreased breath sounds, rhonchi or rales.  Abdominal:     General: Bowel sounds are normal.     Palpations: Abdomen is soft. There is no mass.     Tenderness: There is no abdominal tenderness. There is no guarding.  Musculoskeletal: Normal range of motion.     Right lower leg: 1+ Edema present.     Left lower leg: 1+ Edema present.  Lymphadenopathy:     Cervical: No cervical adenopathy.  Skin:  General: Skin is warm and dry.  Neurological:     Mental Status: She is alert.     Deep Tendon Reflexes: Reflexes are normal and symmetric.     Wt Readings from Last 3 Encounters:  08/19/18 245 lb (111.1 kg)  07/26/18 245 lb (111.1 kg)  12/13/17 246 lb (111.6 kg)    BP 110/62   Pulse 72   Temp 98.5 F (36.9 C)   Ht 5\' 3"  (1.6 m)   Wt 245 lb (111.1 kg)   SpO2 93%   BMI 43.40 kg/m   Assessment and Plan: 1. Acute bronchitis with asthma Patient called with increasing cough and some shortness of breath so was requested to come in for reevaluation.  On exam there is noticed some wheezing but no rales.  Pulse ox was noted to be decreased at 94% will initiate a azithromycin 250 mg 2 today followed by 1 a day for 4 days to cover for atypical organisms.  Patient was refilled her Tessalon Perles and a Trelegy inhaler was substituted for her seldom used Advair.  Patient was encouraged to use her  nebulization albuterol on an 8-hour.  Rather than using her at a inhaler - azithromycin (ZITHROMAX) 250 MG tablet; 2 today then 1 a day for 4 days  Dispense: 6 tablet; Refill: 0 - benzonatate (TESSALON) 100 MG capsule; Take 1 capsule (100 mg total) by mouth 2 (two) times daily as needed for cough.  Dispense: 20 capsule; Refill: 0 - DG Chest 2 View; Future  2. Malignant neoplasm of right breast in female, estrogen receptor positive, unspecified site of breast [facility]) Patient has a history of malignancy in the right breast.  Will obtain a chest x-ray to rule out any chance of pleural effusion  3. Mild intermittent asthma without complication And has a history of mild intermittent asthma without complication I feel that there is a bronchial component to this and will need some assistance with antibiotic.  Chest x-ray was obtained to rule out the possibility of atypical pneumonia.

## 2018-08-24 ENCOUNTER — Encounter: Payer: Self-pay | Admitting: Family Medicine

## 2018-08-24 ENCOUNTER — Ambulatory Visit (INDEPENDENT_AMBULATORY_CARE_PROVIDER_SITE_OTHER): Payer: Medicare Other | Admitting: Family Medicine

## 2018-08-24 ENCOUNTER — Other Ambulatory Visit: Payer: Self-pay

## 2018-08-24 VITALS — BP 132/80 | HR 70 | Ht 63.0 in | Wt 245.0 lb

## 2018-08-24 DIAGNOSIS — F5101 Primary insomnia: Secondary | ICD-10-CM

## 2018-08-24 DIAGNOSIS — J4520 Mild intermittent asthma, uncomplicated: Secondary | ICD-10-CM

## 2018-08-24 DIAGNOSIS — B379 Candidiasis, unspecified: Secondary | ICD-10-CM | POA: Diagnosis not present

## 2018-08-24 DIAGNOSIS — I50813 Acute on chronic right heart failure: Secondary | ICD-10-CM | POA: Diagnosis not present

## 2018-08-24 MED ORDER — ALBUTEROL SULFATE (2.5 MG/3ML) 0.083% IN NEBU: 2.5000 mg | INHALATION_SOLUTION | Freq: Four times a day (QID) | RESPIRATORY_TRACT | 12 refills | Status: DC | PRN

## 2018-08-24 MED ORDER — MUPIROCIN 2 % EX OINT: 1.0000 "application " | TOPICAL_OINTMENT | Freq: Two times a day (BID) | CUTANEOUS | 0 refills | Status: DC

## 2018-08-24 MED ORDER — NYSTATIN 100000 UNIT/GM EX CREA: 1.0000 "application " | TOPICAL_CREAM | Freq: Two times a day (BID) | CUTANEOUS | 0 refills | Status: DC

## 2018-08-24 MED ORDER — TRAZODONE HCL 50 MG PO TABS: 50.0000 mg | ORAL_TABLET | Freq: Every day | ORAL | 1 refills | Status: DC

## 2018-08-24 NOTE — Progress Notes (Signed)
Date:  08/24/2018   Name:  Kirsten Snyder   DOB:  09-10-1933   MRN:  [id number]   Chief Complaint: Follow-up (started Trelegy in place of Advair- feeling better) and Anxiety (needs something for 'tension'- GAD7=13)  Anxiety  Presents for follow-up visit. Symptoms include excessive worry and nervous/anxious behavior. Patient reports no chest pain, compulsions, confusion, decreased concentration, depressed mood, dizziness, dry mouth, feeling of choking, hyperventilation, impotence, insomnia, irritability, malaise, muscle tension, nausea, obsessions, palpitations, panic, restlessness, shortness of breath or suicidal ideas. Symptoms occur occasionally. The severity of symptoms is mild. The quality of sleep is fair.    Rash  This is a recurrent problem. The current episode started in the past 7 days. The affected locations include the torso and chest. The rash is characterized by redness and itchiness. She was exposed to nothing. Pertinent negatives include no anorexia, congestion, cough, diarrhea, eye pain, facial edema, fatigue, fever, joint pain, nail changes, rhinorrhea, shortness of breath, sore throat or vomiting. The treatment provided moderate relief.  Shortness of Breath  This is a chronic problem. The current episode started more than 1 month ago. The problem occurs intermittently. The problem has been gradually improving. Pertinent negatives include no abdominal pain, chest pain, ear pain, fever, headaches, PND, rash, rhinorrhea, sore throat, vomiting or wheezing.    Review of Systems  Constitutional: Negative.  Negative for chills, fatigue, fever, irritability and unexpected weight change.  HENT: Negative for congestion, ear discharge, ear pain, rhinorrhea, sinus pressure, sneezing and sore throat.   Eyes: Negative for photophobia, pain, discharge, redness and itching.  Respiratory: Negative for cough, shortness of breath, wheezing and stridor.        Orthopnea  Cardiovascular:  Negative for chest pain, palpitations and PND.  Gastrointestinal: Negative for abdominal pain, anorexia, blood in stool, constipation, diarrhea, nausea and vomiting.  Endocrine: Negative for cold intolerance, heat intolerance, polydipsia, polyphagia and polyuria.  Genitourinary: Negative for dysuria, flank pain, frequency, hematuria, impotence, menstrual problem, pelvic pain, urgency, vaginal bleeding and vaginal discharge.  Musculoskeletal: Negative for arthralgias, back pain, joint pain and myalgias.  Skin: Negative for nail changes and rash.  Allergic/Immunologic: Negative for environmental allergies and food allergies.  Neurological: Negative for dizziness, weakness, light-headedness, numbness and headaches.  Hematological: Negative for adenopathy. Does not bruise/bleed easily.  Psychiatric/Behavioral: Negative for confusion, decreased concentration, dysphoric mood and suicidal ideas. The patient is nervous/anxious. The patient does not have insomnia.     Patient Active Problem List   Diagnosis Date Noted  . Osteopenia of neck of right femur 06/29/2018  . Breast cancer, right ([place]) 06/17/2016  . Essential hypertension 12/13/2015  . Breast abscess of female 11/07/2015  . Familial multiple lipoprotein-type hyperlipidemia 10/25/2014  . Clinical depression 10/25/2014  . Acid reflux 10/25/2014  . Acute gastrointestinal bleeding 10/25/2014  . Aggrieved 10/25/2014  . Airway hyperreactivity 10/25/2014  . Arthritis of knee 10/25/2014  . H/O: gout 10/25/2014  . Adiposity 10/25/2014  . Arthritis, degenerative 10/25/2014  . H/O adenomatous polyp of colon 03/07/2014    Allergies  Allergen Reactions  . Sulfa Antibiotics     Past Surgical History:  Procedure Laterality Date  . BREAST BIOPSY Bilateral    negative  . BREAST EXCISIONAL BIOPSY Right    right positive 04/2010  . BREAST LUMPECTOMY Right    removed a cancerous lump  . VAGINAL HYSTERECTOMY      Social History   Tobacco  Use  . Smoking status: Former Smoker    Packs/day:  0.25    Years: 2.00    Pack years: 0.50    Types: Cigarettes  . Smokeless tobacco: Never Used  . Tobacco comment: smoking cessation materials not required  Substance Use Topics  . Alcohol use: No    Alcohol/week: 0.0 standard drinks  . Drug use: No     Medication list has been reviewed and updated.  Current Meds  Medication Sig  . acetaminophen (TYLENOL) 325 MG tablet Take by mouth.  Marland Kitchen albuterol (PROVENTIL HFA;VENTOLIN HFA) 108 (90 Base) MCG/ACT inhaler Inhale 2 puffs into the lungs every 6 (six) hours as needed for wheezing or shortness of breath.  Marland Kitchen albuterol (PROVENTIL) (2.5 MG/3ML) 0.083% nebulizer solution Take 3 mLs (2.5 mg total) by nebulization every 6 (six) hours as needed for wheezing or shortness of breath.  . allopurinol (ZYLOPRIM) 100 MG tablet 1 tablet by mouth daily  . aspirin 81 MG tablet Take 81 mg by mouth daily.  . benzonatate (TESSALON) 100 MG capsule Take 1 capsule (100 mg total) by mouth 2 (two) times daily as needed for cough.  . Calcium Carbonate-Vit D-Min (CALTRATE 600+D PLUS MINERALS PO) Take 1 tablet by mouth daily.  . Cholecalciferol (VITAMIN D3) 125 MCG (5000 UT) CAPS Take 1 capsule by mouth daily.  . Fluticasone-Umeclidin-Vilant (TRELEGY ELLIPTA) 100-62.5-25 MCG/INH AEPB Inhale 1 puff into the lungs daily.  . furosemide (LASIX) 20 MG tablet TAKE ONE (1) TABLET BY MOUTH ONCE DAILY (Patient taking differently: Take 20 mg by mouth 2 (two) times daily. TAKE ONE (1) TABLET BY MOUTH twice daily)  . loratadine (CLARITIN) 10 MG tablet TAKE (1) TABLET BY MOUTH EVERY DAY  . losartan (COZAAR) 100 MG tablet One tablet daily  . lovastatin (MEVACOR) 40 MG tablet Take 1 tablet (40 mg total) by mouth at bedtime.  Marland Kitchen nystatin (MYCOSTATIN/NYSTOP) powder Apply topically 3 (three) times daily.  Marland Kitchen omeprazole (PRILOSEC) 20 MG capsule TAKE ONE (1) CAPSULE EACH DAY.  . sertraline (ZOLOFT) 50 MG tablet TAKE ONE (1) TABLET BY  MOUTH ONCE DAILY  . vitamin B-12 (CYANOCOBALAMIN) 1000 MCG tablet Take 1,000 mcg by mouth daily.    PHQ 2/9 Scores 07/26/2018 12/13/2017 11/02/2017 09/03/2017  PHQ - 2 Score 0 0 0 0  PHQ- 9 Score 2 0 3 3    BP Readings from Last 3 Encounters:  08/24/18 132/80  08/19/18 110/62  07/26/18 120/70    Physical Exam Vitals signs and nursing note reviewed.  Constitutional:      General: She is not in acute distress.    Appearance: She is not diaphoretic.  HENT:     Head: Normocephalic and atraumatic.     Right Ear: Tympanic membrane, ear canal and external ear normal.     Left Ear: Tympanic membrane, ear canal and external ear normal.     Nose: Nose normal.     Mouth/Throat:     Mouth: Mucous membranes are moist.  Eyes:     General:        Right eye: No discharge.        Left eye: No discharge.     Conjunctiva/sclera: Conjunctivae normal.     Pupils: Pupils are equal, round, and reactive to light.  Neck:     Musculoskeletal: Normal range of motion and neck supple.     Thyroid: No thyromegaly.     Vascular: No JVD.  Cardiovascular:     Rate and Rhythm: Normal rate and regular rhythm.     Heart sounds: Normal heart sounds. No  murmur. No friction rub. No gallop.   Pulmonary:     Effort: Pulmonary effort is normal.     Breath sounds: Normal breath sounds. No wheezing or rhonchi.  Abdominal:     General: Bowel sounds are normal.     Palpations: Abdomen is soft. There is no mass.     Tenderness: There is no abdominal tenderness. There is no guarding.  Musculoskeletal: Normal range of motion.  Lymphadenopathy:     Cervical: No cervical adenopathy.  Skin:    General: Skin is warm and dry.  Neurological:     Mental Status: She is alert.     Deep Tendon Reflexes: Reflexes are normal and symmetric.     Wt Readings from Last 3 Encounters:  08/24/18 245 lb (111.1 kg)  08/19/18 245 lb (111.1 kg)  07/26/18 245 lb (111.1 kg)    BP 132/80   Pulse 70   Ht 5\' 3"  (1.6 m)   Wt 245 lb  (111.1 kg)   BMI 43.40 kg/m   Assessment and Plan: 1. Candidiasis Recurrent.  Acute.  Patient has areas beneath her breasts that have gotten red and painful.  A bacterial secondary infection.  We will call in nystatin cream to be mixed with Bactroban to use on a twice a day basis. - nystatin cream (MYCOSTATIN); Apply 1 application topically 2 (two) times daily.  Dispense: 30 g; Refill: 0 - mupirocin ointment (BACTROBAN) 2 %; Apply 1 application topically 2 (two) times daily.  Dispense: 22 g; Refill: 0  2. Mild intermittent asthma without complication Chronic.  Patient desires to have a refill on her nebulization solution for her recurrence of reactive airway disease. - albuterol (PROVENTIL) (2.5 MG/3ML) 0.083% nebulizer solution; Take 3 mLs (2.5 mg total) by nebulization every 6 (six) hours as needed for wheezing or shortness of breath.  Dispense: 75 mL; Refill: 12  3. Primary insomnia Discussed with patient that she has been having some issues with increased anxiety.  We also discussed about the appropriateness of benzodiazepines at age.  We will trial some trazodone at night to see if we can improve any insomnia and encouraged taking the Zoloft in the mornings. - traZODone (DESYREL) 50 MG tablet; Take 1 tablet (50 mg total) by mouth at bedtime.  Dispense: 30 tablet; Refill: 1  4. Acute on chronic right-sided congestive heart failure ([place]) Patient appears to be having some orthopnea on questioning and on review of the chest x-ray some mild cardiomegaly was noted.  Patient is continued to have some mild shortness of breath although this has improved with the Trelegy.  We will increase the Lasix to 20 mg twice a day taking in the morning and at noontime to see if we can diurese her a little bit and improve her dyspnea as well we will recheck patient in 4 to 6 weeks and will reweigh her to see if there is any weight loss from the diuresis.

## 2018-09-01 ENCOUNTER — Other Ambulatory Visit: Payer: Self-pay

## 2018-09-01 DIAGNOSIS — J301 Allergic rhinitis due to pollen: Secondary | ICD-10-CM

## 2018-09-01 MED ORDER — FLUTICASONE PROPIONATE 50 MCG/ACT NA SUSP: 2.0000 | Freq: Every day | NASAL | 6 refills | Status: DC

## 2018-09-22 ENCOUNTER — Ambulatory Visit (INDEPENDENT_AMBULATORY_CARE_PROVIDER_SITE_OTHER): Payer: Medicare Other | Admitting: Family Medicine

## 2018-09-22 ENCOUNTER — Other Ambulatory Visit: Payer: Self-pay

## 2018-09-22 ENCOUNTER — Encounter: Payer: Self-pay | Admitting: Family Medicine

## 2018-09-22 VITALS — BP 120/70 | HR 80 | Ht 63.0 in | Wt 252.0 lb

## 2018-09-22 DIAGNOSIS — I10 Essential (primary) hypertension: Secondary | ICD-10-CM | POA: Diagnosis not present

## 2018-09-22 DIAGNOSIS — I509 Heart failure, unspecified: Secondary | ICD-10-CM

## 2018-09-22 DIAGNOSIS — J4520 Mild intermittent asthma, uncomplicated: Secondary | ICD-10-CM | POA: Diagnosis not present

## 2018-09-22 MED ORDER — ALBUTEROL SULFATE HFA 108 (90 BASE) MCG/ACT IN AERS: 2.0000 | INHALATION_SPRAY | Freq: Four times a day (QID) | RESPIRATORY_TRACT | 5 refills | Status: DC | PRN

## 2018-09-22 MED ORDER — FLUTICASONE-UMECLIDIN-VILANT 100-62.5-25 MCG/INH IN AEPB: 1.0000 | INHALATION_SPRAY | Freq: Every morning | RESPIRATORY_TRACT | 5 refills | Status: DC

## 2018-09-22 MED ORDER — FUROSEMIDE 20 MG PO TABS: 20.0000 mg | ORAL_TABLET | Freq: Two times a day (BID) | ORAL | 1 refills | Status: DC

## 2018-09-22 NOTE — Progress Notes (Signed)
Date:  09/22/2018   Name:  Kirsten Snyder   DOB:  07-10-1933   MRN:  [id number]   Chief Complaint: Follow-up (SOB- doesn't seem to be peeing as much)  Shortness of Breath  This is a new problem. The current episode started more than 1 month ago (months and months and months). The problem occurs intermittently. Associated symptoms include leg swelling, orthopnea and PND. Pertinent negatives include no abdominal pain, chest pain, claudication, coryza, ear pain, fever, headaches, hemoptysis, leg pain, neck pain, rash, rhinorrhea, sore throat, sputum production, swollen glands, syncope, vomiting or wheezing. The symptoms are aggravated by any activity. The patient has no known risk factors for DVT/PE. She has tried beta agonist inhalers, ipratropium inhalers and steroid inhalers for the symptoms. The treatment provided moderate relief. Her past medical history is significant for asthma. There is no history of allergies, aspirin allergies, bronchiolitis, CAD, chronic lung disease, COPD, DVT, a heart failure, PE, pneumonia or a recent surgery.    Review of Systems  Constitutional: Negative.  Negative for chills, fatigue, fever and unexpected weight change.  HENT: Negative for congestion, ear discharge, ear pain, rhinorrhea, sinus pressure, sneezing and sore throat.   Eyes: Negative for photophobia, pain, discharge, redness and itching.  Respiratory: Positive for shortness of breath. Negative for cough, hemoptysis, sputum production, wheezing and stridor.   Cardiovascular: Positive for orthopnea, leg swelling and PND. Negative for chest pain, claudication and syncope.  Gastrointestinal: Negative for abdominal pain, blood in stool, constipation, diarrhea, nausea and vomiting.  Endocrine: Negative for cold intolerance, heat intolerance, polydipsia, polyphagia and polyuria.  Genitourinary: Negative for dysuria, flank pain, frequency, hematuria, menstrual problem, pelvic pain, urgency, vaginal bleeding and  vaginal discharge.  Musculoskeletal: Negative for arthralgias, back pain, myalgias and neck pain.  Skin: Negative for rash.  Allergic/Immunologic: Negative for environmental allergies and food allergies.  Neurological: Negative for dizziness, weakness, light-headedness, numbness and headaches.  Hematological: Negative for adenopathy. Does not bruise/bleed easily.  Psychiatric/Behavioral: Negative for dysphoric mood. The patient is not nervous/anxious.     Patient Active Problem List   Diagnosis Date Noted  . Osteopenia of neck of right femur 06/29/2018  . Breast cancer, right ([place]) 06/17/2016  . Essential hypertension 12/13/2015  . Breast abscess of female 11/07/2015  . Familial multiple lipoprotein-type hyperlipidemia 10/25/2014  . Clinical depression 10/25/2014  . Acid reflux 10/25/2014  . Acute gastrointestinal bleeding 10/25/2014  . Aggrieved 10/25/2014  . Airway hyperreactivity 10/25/2014  . Arthritis of knee 10/25/2014  . H/O: gout 10/25/2014  . Adiposity 10/25/2014  . Arthritis, degenerative 10/25/2014  . H/O adenomatous polyp of colon 03/07/2014    Allergies  Allergen Reactions  . Sulfa Antibiotics     Past Surgical History:  Procedure Laterality Date  . BREAST BIOPSY Bilateral    negative  . BREAST EXCISIONAL BIOPSY Right    right positive 04/2010  . BREAST LUMPECTOMY Right    removed a cancerous lump  . VAGINAL HYSTERECTOMY      Social History   Tobacco Use  . Smoking status: Former Smoker    Packs/day: 0.25    Years: 2.00    Pack years: 0.50    Types: Cigarettes  . Smokeless tobacco: Never Used  . Tobacco comment: smoking cessation materials not required  Substance Use Topics  . Alcohol use: No    Alcohol/week: 0.0 standard drinks  . Drug use: No     Medication list has been reviewed and updated.  Current Meds  Medication Sig  .  acetaminophen (TYLENOL) 325 MG tablet Take by mouth.  Marland Kitchen albuterol (PROVENTIL HFA;VENTOLIN HFA) 108 (90 Base)  MCG/ACT inhaler Inhale 2 puffs into the lungs every 6 (six) hours as needed for wheezing or shortness of breath.  Marland Kitchen albuterol (PROVENTIL) (2.5 MG/3ML) 0.083% nebulizer solution Take 3 mLs (2.5 mg total) by nebulization every 6 (six) hours as needed for wheezing or shortness of breath.  . allopurinol (ZYLOPRIM) 100 MG tablet 1 tablet by mouth daily  . aspirin 81 MG tablet Take 81 mg by mouth daily.  . Calcium Carbonate-Vit D-Min (CALTRATE 600+D PLUS MINERALS PO) Take 1 tablet by mouth daily.  . Cholecalciferol (VITAMIN D3) 125 MCG (5000 UT) CAPS Take 1 capsule by mouth daily.  . fluticasone (FLONASE) 50 MCG/ACT nasal spray Place 2 sprays into both nostrils daily.  . furosemide (LASIX) 20 MG tablet TAKE ONE (1) TABLET BY MOUTH ONCE DAILY (Patient taking differently: Take 20 mg by mouth 2 (two) times daily. TAKE ONE (1) TABLET BY MOUTH twice daily)  . loratadine (CLARITIN) 10 MG tablet TAKE (1) TABLET BY MOUTH EVERY DAY  . losartan (COZAAR) 100 MG tablet One tablet daily  . lovastatin (MEVACOR) 40 MG tablet Take 1 tablet (40 mg total) by mouth at bedtime.  . mupirocin ointment (BACTROBAN) 2 % Apply 1 application topically 2 (two) times daily.  Marland Kitchen nystatin (MYCOSTATIN/NYSTOP) powder Apply topically 3 (three) times daily.  Marland Kitchen nystatin cream (MYCOSTATIN) Apply 1 application topically 2 (two) times daily.  Marland Kitchen omeprazole (PRILOSEC) 20 MG capsule TAKE ONE (1) CAPSULE EACH DAY.  . sertraline (ZOLOFT) 50 MG tablet TAKE ONE (1) TABLET BY MOUTH ONCE DAILY  . traZODone (DESYREL) 50 MG tablet Take 1 tablet (50 mg total) by mouth at bedtime.  . vitamin B-12 (CYANOCOBALAMIN) 1000 MCG tablet Take 1,000 mcg by mouth daily.    PHQ 2/9 Scores 07/26/2018 12/13/2017 11/02/2017 09/03/2017  PHQ - 2 Score 0 0 0 0  PHQ- 9 Score 2 0 3 3    BP Readings from Last 3 Encounters:  09/22/18 120/70  08/24/18 132/80  08/19/18 110/62    Physical Exam Vitals signs and nursing note reviewed.  Constitutional:      Appearance: She  is well-developed. She is obese.  HENT:     Head: Normocephalic.     Right Ear: Tympanic membrane, ear canal and external ear normal.     Left Ear: Tympanic membrane, ear canal and external ear normal.     Nose: Nose normal.     Mouth/Throat:     Mouth: Mucous membranes are moist.  Eyes:     General: Lids are everted, no foreign bodies appreciated. No scleral icterus.       Left eye: No foreign body or hordeolum.     Conjunctiva/sclera: Conjunctivae normal.     Right eye: Right conjunctiva is not injected.     Left eye: Left conjunctiva is not injected.     Pupils: Pupils are equal, round, and reactive to light.  Neck:     Musculoskeletal: Normal range of motion and neck supple.     Thyroid: No thyromegaly.     Vascular: No JVD.     Trachea: No tracheal deviation.  Cardiovascular:     Rate and Rhythm: Normal rate and regular rhythm.     Pulses: Normal pulses.     Heart sounds: Normal heart sounds. No murmur. No friction rub. No gallop.   Pulmonary:     Effort: Pulmonary effort is normal. No respiratory distress.  Breath sounds: No stridor. Wheezing present. No rhonchi or rales.  Chest:     Chest wall: No tenderness.  Abdominal:     General: Bowel sounds are normal.     Palpations: Abdomen is soft. There is no mass.     Tenderness: There is no abdominal tenderness. There is no guarding or rebound.  Musculoskeletal: Normal range of motion.        General: No tenderness.  Lymphadenopathy:     Cervical: No cervical adenopathy.  Skin:    General: Skin is warm.     Findings: No rash.  Neurological:     Mental Status: She is alert and oriented to person, place, and time.     Cranial Nerves: No cranial nerve deficit.     Deep Tendon Reflexes: Reflexes normal.  Psychiatric:        Mood and Affect: Mood is not anxious or depressed.     Wt Readings from Last 3 Encounters:  09/22/18 252 lb (114.3 kg)  08/24/18 245 lb (111.1 kg)  08/19/18 245 lb (111.1 kg)    BP 120/70    Pulse 80   Ht 5\' 3"  (1.6 m)   Wt 252 lb (114.3 kg)   SpO2 93%   BMI 44.64 kg/m   Assessment and Plan: 1. Mild intermittent asthma without complication Patient with history of asthma with some confusion about her inhalers that is admitted to some compliance concerns.  Patient is only been taking rescue inhaler as needed and is continued to have shortness of breath initially she was not able to afford the Trelegy.  We have arranged for her to have Trelegy for $47.  She was also instructed to use her rescue inhaler on a more frequent basis every 6-8 hours albuterol. - albuterol (VENTOLIN HFA) 108 (90 Base) MCG/ACT inhaler; Inhale 2 puffs into the lungs every 6 (six) hours as needed for wheezing or shortness of breath.  Dispense: 1 Inhaler; Refill: 5  2. Essential hypertension Patient has had a 6 to 7 pound weight gain but this denies intake of any sodium.  Encouraged to continue taking furosemide 20 mg twice a day avoidance of sodium.  And we will check a renal function panel. - furosemide (LASIX) 20 MG tablet; Take 1 tablet (20 mg total) by mouth 2 (two) times daily. TAKE ONE (1) TABLET BY MOUTH twice daily  Dispense: 60 tablet; Refill: 1 - Renal Function Panel  3. chf patient seems to have symptoms of congestive heart failure for which we will do a BNP evaluation.  Encouraged her to continue this is and referral to cardiology was in place.

## 2018-09-22 NOTE — Patient Instructions (Signed)

## 2018-09-23 LAB — RENAL FUNCTION PANEL
Albumin: 4.4 g/dL (ref 3.6–4.6)
BUN/Creatinine Ratio: 16 (ref 12–28)
BUN: 14 mg/dL (ref 8–27)
CO2: 29 mmol/L (ref 20–29)
Calcium: 9.8 mg/dL (ref 8.7–10.3)
Chloride: 97 mmol/L (ref 96–106)
Creatinine, Ser: 0.85 mg/dL (ref 0.57–1.00)
GFR calc Af Amer: 73 mL/min/{1.73_m2} (ref >59)
GFR calc non Af Amer: 64 mL/min/{1.73_m2} (ref >59)
Glucose: 93 mg/dL (ref 65–99)
Phosphorus: 3.2 mg/dL (ref 3.0–4.3)
Potassium: 4.5 mmol/L (ref 3.5–5.2)
Sodium: 141 mmol/L (ref 134–144)

## 2018-09-23 LAB — BRAIN NATRIURETIC PEPTIDE: BNP: 24.2 pg/mL (ref 0.0–100.0)

## 2018-09-29 ENCOUNTER — Ambulatory Visit (INDEPENDENT_AMBULATORY_CARE_PROVIDER_SITE_OTHER): Payer: Medicare Other | Admitting: Family Medicine

## 2018-09-29 ENCOUNTER — Encounter: Payer: Self-pay | Admitting: Family Medicine

## 2018-09-29 ENCOUNTER — Other Ambulatory Visit: Payer: Self-pay

## 2018-09-29 VITALS — BP 120/80 | HR 68 | Ht 63.0 in | Wt 247.0 lb

## 2018-09-29 DIAGNOSIS — I10 Essential (primary) hypertension: Secondary | ICD-10-CM | POA: Diagnosis not present

## 2018-09-29 DIAGNOSIS — J4520 Mild intermittent asthma, uncomplicated: Secondary | ICD-10-CM | POA: Diagnosis not present

## 2018-09-29 DIAGNOSIS — I50813 Acute on chronic right heart failure: Secondary | ICD-10-CM

## 2018-09-29 NOTE — Progress Notes (Signed)
Date:  09/29/2018   Name:  Kirsten Snyder   DOB:  [birth year]   MRN:  [id number]   Chief Complaint: Leg Swelling (follow up- lost 5 pounds. Taking 20mg  BID)  Shortness of Breath This is a new problem. The current episode started 1 to 4 weeks ago. The problem occurs constantly. The problem has been gradually improving (on current regimen furosemide and trelegy). Pertinent negatives include no abdominal pain, chest pain, claudication, coryza, ear pain, fever, headaches, hemoptysis, leg pain, leg swelling, neck pain, orthopnea, PND, rash, rhinorrhea, sore throat, sputum production, swollen glands, syncope, vomiting or wheezing. The symptoms are aggravated by any activity. The patient has no known risk factors for DVT/PE. She has tried beta agonist inhalers, steroid inhalers and ipratropium inhalers for the symptoms. The treatment provided moderate relief.    Review of Systems  Constitutional: Negative.  Negative for chills, fatigue, fever and unexpected weight change.  HENT: Negative for congestion, ear discharge, ear pain, rhinorrhea, sinus pressure, sneezing and sore throat.   Eyes: Negative for photophobia, pain, discharge, redness and itching.  Respiratory: Positive for shortness of breath. Negative for apnea, cough, hemoptysis, sputum production, choking, chest tightness, wheezing and stridor.   Cardiovascular: Negative for chest pain, orthopnea, claudication, leg swelling, syncope and PND.  Gastrointestinal: Negative for abdominal pain, blood in stool, constipation, diarrhea, nausea and vomiting.  Endocrine: Negative for cold intolerance, heat intolerance, polydipsia, polyphagia and polyuria.  Genitourinary: Negative for dysuria, flank pain, frequency, hematuria, menstrual problem, pelvic pain, urgency, vaginal bleeding and vaginal discharge.  Musculoskeletal: Negative for arthralgias, back pain, myalgias and neck pain.  Skin: Negative for rash.  Allergic/Immunologic: Negative for  environmental allergies and food allergies.  Neurological: Negative for dizziness, weakness, light-headedness, numbness and headaches.  Hematological: Negative for adenopathy. Does not bruise/bleed easily.  Psychiatric/Behavioral: Negative for dysphoric mood. The patient is not nervous/anxious.     Patient Active Problem List   Diagnosis Date Noted  . Osteopenia of neck of right femur 06/29/2018  . Breast cancer, right ([place]) 06/17/2016  . Essential hypertension 12/13/2015  . Breast abscess of female 11/07/2015  . Familial multiple lipoprotein-type hyperlipidemia 10/25/2014  . Clinical depression 10/25/2014  . Acid reflux 10/25/2014  . Acute gastrointestinal bleeding 10/25/2014  . Aggrieved 10/25/2014  . Airway hyperreactivity 10/25/2014  . Arthritis of knee 10/25/2014  . H/O: gout 10/25/2014  . Adiposity 10/25/2014  . Arthritis, degenerative 10/25/2014  . H/O adenomatous polyp of colon 03/07/2014    Allergies  Allergen Reactions  . Sulfa Antibiotics     Past Surgical History:  Procedure Laterality Date  . BREAST BIOPSY Bilateral    negative  . BREAST EXCISIONAL BIOPSY Right    right positive 04/2010  . BREAST LUMPECTOMY Right    removed a cancerous lump  . VAGINAL HYSTERECTOMY      Social History   Tobacco Use  . Smoking status: Former Smoker    Packs/day: 0.25    Years: 2.00    Pack years: 0.50    Types: Cigarettes  . Smokeless tobacco: Never Used  . Tobacco comment: smoking cessation materials not required  Substance Use Topics  . Alcohol use: No    Alcohol/week: 0.0 standard drinks  . Drug use: No     Medication list has been reviewed and updated.  Current Meds  Medication Sig  . acetaminophen (TYLENOL) 325 MG tablet Take by mouth.  Marland Kitchen albuterol (VENTOLIN HFA) 108 (90 Base) MCG/ACT inhaler Inhale 2 puffs into the lungs every 6 (  six) hours as needed for wheezing or shortness of breath.  . allopurinol (ZYLOPRIM) 100 MG tablet 1 tablet by mouth daily  .  aspirin 81 MG tablet Take 81 mg by mouth daily.  . Calcium Carbonate-Vit D-Min (CALTRATE 600+D PLUS MINERALS PO) Take 1 tablet by mouth daily.  . Cholecalciferol (VITAMIN D3) 125 MCG (5000 UT) CAPS Take 1 capsule by mouth daily.  . fluticasone (FLONASE) 50 MCG/ACT nasal spray Place 2 sprays into both nostrils daily.  . Fluticasone-Umeclidin-Vilant (TRELEGY ELLIPTA) 100-62.5-25 MCG/INH AEPB Inhale 1 puff into the lungs every morning.  . furosemide (LASIX) 20 MG tablet Take 1 tablet (20 mg total) by mouth 2 (two) times daily. TAKE ONE (1) TABLET BY MOUTH twice daily  . loratadine (CLARITIN) 10 MG tablet TAKE (1) TABLET BY MOUTH EVERY DAY  . losartan (COZAAR) 100 MG tablet One tablet daily  . lovastatin (MEVACOR) 40 MG tablet Take 1 tablet (40 mg total) by mouth at bedtime.  . mupirocin ointment (BACTROBAN) 2 % Apply 1 application topically 2 (two) times daily.  Marland Kitchen nystatin (MYCOSTATIN/NYSTOP) powder Apply topically 3 (three) times daily.  Marland Kitchen nystatin cream (MYCOSTATIN) Apply 1 application topically 2 (two) times daily.  Marland Kitchen omeprazole (PRILOSEC) 20 MG capsule TAKE ONE (1) CAPSULE EACH DAY.  . sertraline (ZOLOFT) 50 MG tablet TAKE ONE (1) TABLET BY MOUTH ONCE DAILY  . traZODone (DESYREL) 50 MG tablet Take 1 tablet (50 mg total) by mouth at bedtime.  . vitamin B-12 (CYANOCOBALAMIN) 1000 MCG tablet Take 1,000 mcg by mouth daily.    PHQ 2/9 Scores 07/26/2018 12/13/2017 11/02/2017 09/03/2017  PHQ - 2 Score 0 0 0 0  PHQ- 9 Score 2 0 3 3    BP Readings from Last 3 Encounters:  09/29/18 120/80  09/22/18 120/70  08/24/18 132/80    Physical Exam Vitals signs and nursing note reviewed.  Constitutional:      Appearance: She is well-developed.  HENT:     Head: Normocephalic.     Right Ear: Tympanic membrane and external ear normal.     Left Ear: Tympanic membrane and external ear normal.     Nose: Nose normal.  Eyes:     General: Lids are everted, no foreign bodies appreciated. No scleral icterus.        Left eye: No foreign body or hordeolum.     Conjunctiva/sclera: Conjunctivae normal.     Right eye: Right conjunctiva is not injected.     Left eye: Left conjunctiva is not injected.     Pupils: Pupils are equal, round, and reactive to light.  Neck:     Musculoskeletal: Normal range of motion and neck supple.     Thyroid: No thyromegaly.     Vascular: No JVD.     Trachea: No tracheal deviation.  Cardiovascular:     Rate and Rhythm: Normal rate and regular rhythm.     Heart sounds: Normal heart sounds. No murmur. No friction rub. No gallop.   Pulmonary:     Effort: Pulmonary effort is normal. No respiratory distress.     Breath sounds: Normal breath sounds. No decreased breath sounds, wheezing, rhonchi or rales.  Abdominal:     General: Bowel sounds are normal.     Palpations: Abdomen is soft. There is no mass.     Tenderness: There is no abdominal tenderness. There is no guarding or rebound.  Musculoskeletal: Normal range of motion.        General: No tenderness.  Lymphadenopathy:  Cervical: No cervical adenopathy.  Skin:    General: Skin is warm.     Capillary Refill: Capillary refill takes less than 2 seconds.     Findings: No rash.  Neurological:     Mental Status: She is alert and oriented to person, place, and time.     Cranial Nerves: No cranial nerve deficit.     Deep Tendon Reflexes: Reflexes normal.  Psychiatric:        Mood and Affect: Mood is not anxious or depressed.     Wt Readings from Last 3 Encounters:  09/29/18 247 lb (112 kg)  09/22/18 252 lb (114.3 kg)  08/24/18 245 lb (111.1 kg)    BP 120/80   Pulse 68   Ht 5\' 3"  (1.6 m)   Wt 247 lb (112 kg)   BMI 43.75 kg/m   Assessment and Plan:  1. Essential hypertension Chronic.  Controlled.  Will continue the furosemide milligrams twice a day.  Patient is tolerating this well and blood pressure is appropriate at 120/80.  2. Mild intermittent asthma without complication Patient still cannot  remember when I tell her that she needs to take her rescue along with her Trelegy.  I have discussed this with her and with her son that we want her at least to have 2-3 albuterol inhalations to along with her Trelegy.  For underlying lung disease.  3. Acute on chronic right-sided congestive heart failure ([place]) I think patient probably also has a little touch of congestive heart failure despite her being normal with her brain natruretic hormone.  Continue the furosemide because she is lost 5 pounds since he is seen a benefit in her ability to breathe.  We will continue this for at least another 6 weeks and we will recheck her at that time.

## 2018-10-05 ENCOUNTER — Other Ambulatory Visit: Payer: Self-pay | Admitting: Family Medicine

## 2018-10-05 DIAGNOSIS — J301 Allergic rhinitis due to pollen: Secondary | ICD-10-CM

## 2018-10-12 DIAGNOSIS — E782 Mixed hyperlipidemia: Secondary | ICD-10-CM | POA: Diagnosis not present

## 2018-10-12 DIAGNOSIS — R0602 Shortness of breath: Secondary | ICD-10-CM | POA: Diagnosis not present

## 2018-10-12 DIAGNOSIS — I700 Atherosclerosis of aorta: Secondary | ICD-10-CM | POA: Diagnosis not present

## 2018-10-12 DIAGNOSIS — I10 Essential (primary) hypertension: Secondary | ICD-10-CM | POA: Diagnosis not present

## 2018-10-19 ENCOUNTER — Other Ambulatory Visit: Payer: Self-pay | Admitting: Family Medicine

## 2018-10-19 DIAGNOSIS — F5101 Primary insomnia: Secondary | ICD-10-CM

## 2018-10-31 DIAGNOSIS — I10 Essential (primary) hypertension: Secondary | ICD-10-CM | POA: Diagnosis not present

## 2018-10-31 DIAGNOSIS — R0602 Shortness of breath: Secondary | ICD-10-CM | POA: Diagnosis not present

## 2018-10-31 DIAGNOSIS — I700 Atherosclerosis of aorta: Secondary | ICD-10-CM | POA: Diagnosis not present

## 2018-10-31 DIAGNOSIS — E782 Mixed hyperlipidemia: Secondary | ICD-10-CM | POA: Diagnosis not present

## 2018-11-10 ENCOUNTER — Other Ambulatory Visit: Payer: Self-pay

## 2018-11-10 ENCOUNTER — Ambulatory Visit (INDEPENDENT_AMBULATORY_CARE_PROVIDER_SITE_OTHER): Payer: Medicare Other | Admitting: Family Medicine

## 2018-11-10 ENCOUNTER — Encounter: Payer: Self-pay | Admitting: Family Medicine

## 2018-11-10 VITALS — BP 110/76 | HR 68 | Ht 63.0 in | Wt 240.0 lb

## 2018-11-10 DIAGNOSIS — N3090 Cystitis, unspecified without hematuria: Secondary | ICD-10-CM | POA: Diagnosis not present

## 2018-11-10 MED ORDER — CEPHALEXIN 500 MG PO CAPS: 500.0000 mg | ORAL_CAPSULE | Freq: Three times a day (TID) | ORAL | 0 refills | Status: DC

## 2018-11-10 NOTE — Progress Notes (Signed)
Date:  11/10/2018   Name:  Kirsten Snyder   DOB:  02-29-1934   MRN:  [id number]   Chief Complaint: Urinary Tract Infection (lower back pain and pressure when starting to urinate. Has been taking AZO x 2 days)  Urinary Tract Infection  This is a new problem. The current episode started in the past 7 days. The problem occurs every urination. The problem has been gradually worsening. The quality of the pain is described as aching. The pain is moderate. There has been no fever. Associated symptoms include frequency and urgency. Pertinent negatives include no chills, discharge, flank pain, hematuria, hesitancy, nausea, sweats or vomiting. Associated symptoms comments: Urge incontinence. Treatments tried: AZO. The treatment provided no relief.    Review of Systems  Constitutional: Negative.  Negative for chills, fatigue, fever and unexpected weight change.  HENT: Negative for congestion, ear discharge, ear pain, rhinorrhea, sinus pressure, sneezing and sore throat.   Eyes: Negative for photophobia, pain, discharge, redness and itching.  Respiratory: Negative for cough, shortness of breath, wheezing and stridor.   Cardiovascular: Negative for chest pain, palpitations and leg swelling.  Gastrointestinal: Negative for abdominal pain, blood in stool, constipation, diarrhea, nausea and vomiting.  Endocrine: Negative for cold intolerance, heat intolerance, polydipsia, polyphagia and polyuria.  Genitourinary: Positive for frequency and urgency. Negative for dysuria, flank pain, hematuria, hesitancy, menstrual problem, pelvic pain, vaginal bleeding and vaginal discharge.  Musculoskeletal: Negative for arthralgias, back pain and myalgias.  Skin: Negative for rash.  Allergic/Immunologic: Negative for environmental allergies and food allergies.  Neurological: Negative for dizziness, weakness, light-headedness, numbness and headaches.  Hematological: Negative for adenopathy. Does not bruise/bleed easily.    Psychiatric/Behavioral: Negative for dysphoric mood. The patient is not nervous/anxious.     Patient Active Problem List   Diagnosis Date Noted   Osteopenia of neck of right femur 06/29/2018   Breast cancer, right ([place]) 06/17/2016   Essential hypertension 12/13/2015   Breast abscess of female 11/07/2015   Familial multiple lipoprotein-type hyperlipidemia 10/25/2014   Clinical depression 10/25/2014   Acid reflux 10/25/2014   Acute gastrointestinal bleeding 10/25/2014   Aggrieved 10/25/2014   Airway hyperreactivity 10/25/2014   Arthritis of knee 10/25/2014   H/O: gout 10/25/2014   Adiposity 10/25/2014   Arthritis, degenerative 10/25/2014   H/O adenomatous polyp of colon 03/07/2014    Allergies  Allergen Reactions   Sulfa Antibiotics     Past Surgical History:  Procedure Laterality Date   BREAST BIOPSY Bilateral    negative   BREAST EXCISIONAL BIOPSY Right    right positive 04/2010   BREAST LUMPECTOMY Right    removed a cancerous lump   VAGINAL HYSTERECTOMY      Social History   Tobacco Use   Smoking status: Former Smoker    Packs/day: 0.25    Years: 2.00    Pack years: 0.50    Types: Cigarettes   Smokeless tobacco: Never Used   Tobacco comment: smoking cessation materials not required  Substance Use Topics   Alcohol use: No    Alcohol/week: 0.0 standard drinks   Drug use: No     Medication list has been reviewed and updated.  Current Meds  Medication Sig   acetaminophen (TYLENOL) 325 MG tablet Take by mouth.   albuterol (VENTOLIN HFA) 108 (90 Base) MCG/ACT inhaler Inhale 2 puffs into the lungs every 6 (six) hours as needed for wheezing or shortness of breath.   ALLERGY RELIEF 10 MG tablet TAKE (1) TABLET BY MOUTH EVERY  DAY   allopurinol (ZYLOPRIM) 100 MG tablet 1 tablet by mouth daily   aspirin 81 MG tablet Take 81 mg by mouth daily.   Calcium Carbonate-Vit D-Min (CALTRATE 600+D PLUS MINERALS PO) Take 1 tablet by mouth  daily.   Cholecalciferol (VITAMIN D3) 125 MCG (5000 UT) CAPS Take 1 capsule by mouth daily.   fluticasone (FLONASE) 50 MCG/ACT nasal spray Place 2 sprays into both nostrils daily.   Fluticasone-Umeclidin-Vilant (TRELEGY ELLIPTA) 100-62.5-25 MCG/INH AEPB Inhale 1 puff into the lungs every morning.   furosemide (LASIX) 20 MG tablet Take 1 tablet (20 mg total) by mouth 2 (two) times daily. TAKE ONE (1) TABLET BY MOUTH twice daily   losartan (COZAAR) 100 MG tablet One tablet daily   lovastatin (MEVACOR) 40 MG tablet Take 1 tablet (40 mg total) by mouth at bedtime.   mupirocin ointment (BACTROBAN) 2 % Apply 1 application topically 2 (two) times daily.   nystatin (MYCOSTATIN/NYSTOP) powder Apply topically 3 (three) times daily.   nystatin cream (MYCOSTATIN) Apply 1 application topically 2 (two) times daily.   omeprazole (PRILOSEC) 20 MG capsule TAKE ONE (1) CAPSULE EACH DAY.   sertraline (ZOLOFT) 50 MG tablet TAKE ONE (1) TABLET BY MOUTH ONCE DAILY   traZODone (DESYREL) 50 MG tablet TAKE ONE TABLET BY MOUTH AT BEDTIME   vitamin B-12 (CYANOCOBALAMIN) 1000 MCG tablet Take 1,000 mcg by mouth daily.    PHQ 2/9 Scores 11/10/2018 07/26/2018 12/13/2017 11/02/2017  PHQ - 2 Score 0 0 0 0  PHQ- 9 Score 0 2 0 3    BP Readings from Last 3 Encounters:  11/10/18 110/76  09/29/18 120/80  09/22/18 120/70    Physical Exam Vitals signs and nursing note reviewed.  Constitutional:      Appearance: She is well-developed.  HENT:     Head: Normocephalic.     Right Ear: Tympanic membrane, ear canal and external ear normal.     Left Ear: Tympanic membrane, ear canal and external ear normal.     Nose: Nose normal. No congestion or rhinorrhea.     Mouth/Throat:     Mouth: Mucous membranes are moist.     Pharynx: No oropharyngeal exudate or posterior oropharyngeal erythema.  Eyes:     General: Lids are everted, no foreign bodies appreciated. No scleral icterus.       Left eye: No foreign body or  hordeolum.     Conjunctiva/sclera: Conjunctivae normal.     Right eye: Right conjunctiva is not injected.     Left eye: Left conjunctiva is not injected.     Pupils: Pupils are equal, round, and reactive to light.  Neck:     Musculoskeletal: Normal range of motion and neck supple.     Thyroid: No thyromegaly.     Vascular: No JVD.     Trachea: No tracheal deviation.  Cardiovascular:     Rate and Rhythm: Normal rate and regular rhythm.     Heart sounds: Normal heart sounds. No murmur. No friction rub. No gallop.   Pulmonary:     Effort: Pulmonary effort is normal. No respiratory distress.     Breath sounds: Normal breath sounds. No wheezing, rhonchi or rales.  Chest:     Chest wall: No tenderness.  Abdominal:     General: Bowel sounds are normal.     Palpations: Abdomen is soft. There is no mass.     Tenderness: There is no abdominal tenderness. There is no guarding or rebound.  Musculoskeletal: Normal range of  motion.        General: No tenderness.  Lymphadenopathy:     Cervical: No cervical adenopathy.  Skin:    General: Skin is warm.     Findings: No rash.  Neurological:     Mental Status: She is alert and oriented to person, place, and time.     Cranial Nerves: No cranial nerve deficit.     Deep Tendon Reflexes: Reflexes normal.  Psychiatric:        Mood and Affect: Mood is not anxious or depressed.     Wt Readings from Last 3 Encounters:  11/10/18 240 lb (108.9 kg)  09/29/18 247 lb (112 kg)  09/22/18 252 lb (114.3 kg)    BP 110/76    Pulse 68    Ht 5\' 3"  (1.6 m)    Wt 240 lb (108.9 kg)    BMI 42.51 kg/m   Assessment and Plan: 1. Cystitis Acute.  New onset.  Persistent.  Patient has been taking Azo with only partial relief now.  Will initiate cephalexin 500 mg 3 times a day for 3 days. - cephALEXin (KEFLEX) 500 MG capsule; Take 1 capsule (500 mg total) by mouth 3 (three) times daily.  Dispense: 15 capsule; Refill: 0

## 2018-12-13 ENCOUNTER — Other Ambulatory Visit: Payer: Self-pay | Admitting: Family Medicine

## 2018-12-13 DIAGNOSIS — F5101 Primary insomnia: Secondary | ICD-10-CM

## 2018-12-13 DIAGNOSIS — B379 Candidiasis, unspecified: Secondary | ICD-10-CM

## 2019-02-06 ENCOUNTER — Other Ambulatory Visit: Payer: Self-pay | Admitting: Family Medicine

## 2019-02-06 DIAGNOSIS — I10 Essential (primary) hypertension: Secondary | ICD-10-CM

## 2019-02-10 ENCOUNTER — Telehealth: Payer: Self-pay | Admitting: Family Medicine

## 2019-02-13 ENCOUNTER — Other Ambulatory Visit: Payer: Self-pay

## 2019-02-13 DIAGNOSIS — E7849 Other hyperlipidemia: Secondary | ICD-10-CM

## 2019-02-13 DIAGNOSIS — F5101 Primary insomnia: Secondary | ICD-10-CM

## 2019-02-13 DIAGNOSIS — I10 Essential (primary) hypertension: Secondary | ICD-10-CM

## 2019-02-13 DIAGNOSIS — F3341 Major depressive disorder, recurrent, in partial remission: Secondary | ICD-10-CM

## 2019-02-13 DIAGNOSIS — K219 Gastro-esophageal reflux disease without esophagitis: Secondary | ICD-10-CM

## 2019-02-13 MED ORDER — OMEPRAZOLE 20 MG PO CPDR: DELAYED_RELEASE_CAPSULE | ORAL | 0 refills | Status: DC

## 2019-02-13 MED ORDER — SERTRALINE HCL 50 MG PO TABS: ORAL_TABLET | ORAL | 0 refills | Status: DC

## 2019-02-13 MED ORDER — LOVASTATIN 40 MG PO TABS: 40.0000 mg | ORAL_TABLET | Freq: Every day | ORAL | 0 refills | Status: DC

## 2019-02-13 MED ORDER — LOSARTAN POTASSIUM 100 MG PO TABS: ORAL_TABLET | ORAL | 0 refills | Status: DC

## 2019-02-13 MED ORDER — TRAZODONE HCL 50 MG PO TABS: ORAL_TABLET | ORAL | 2 refills | Status: DC

## 2019-02-24 ENCOUNTER — Telehealth: Payer: Self-pay | Admitting: Family Medicine

## 2019-02-24 NOTE — Telephone Encounter (Signed)
Called to schedule Medicare Annual Wellness Visit with Nurse Health Advisor, Kasey Uthus at [facility]. If patient returns call, please schedule AWV with [facility] ~ Any date on [facility] schedule (Mon or Wed)  °Questions regarding scheduling, please call  [phone] or MS Teams > [email or web address]  ° °Kathryn Brown  °Care Guide • Embedded Care Coordination °[place]   Care Management °[email or web address]   [phone]  ° ° ° °

## 2019-03-21 ENCOUNTER — Other Ambulatory Visit: Payer: Self-pay | Admitting: Family Medicine

## 2019-04-07 ENCOUNTER — Ambulatory Visit: Payer: Self-pay | Admitting: Family Medicine

## 2019-04-11 ENCOUNTER — Ambulatory Visit (INDEPENDENT_AMBULATORY_CARE_PROVIDER_SITE_OTHER): Payer: Medicare Other | Admitting: Family Medicine

## 2019-04-11 ENCOUNTER — Other Ambulatory Visit: Payer: Self-pay

## 2019-04-11 ENCOUNTER — Encounter: Payer: Self-pay | Admitting: Family Medicine

## 2019-04-11 VITALS — BP 120/70 | HR 64 | Ht 63.0 in | Wt 233.0 lb

## 2019-04-11 DIAGNOSIS — F5101 Primary insomnia: Secondary | ICD-10-CM

## 2019-04-11 DIAGNOSIS — Z23 Encounter for immunization: Secondary | ICD-10-CM

## 2019-04-11 DIAGNOSIS — I10 Essential (primary) hypertension: Secondary | ICD-10-CM

## 2019-04-11 DIAGNOSIS — F3341 Major depressive disorder, recurrent, in partial remission: Secondary | ICD-10-CM

## 2019-04-11 DIAGNOSIS — E790 Hyperuricemia without signs of inflammatory arthritis and tophaceous disease: Secondary | ICD-10-CM | POA: Diagnosis not present

## 2019-04-11 DIAGNOSIS — J4520 Mild intermittent asthma, uncomplicated: Secondary | ICD-10-CM

## 2019-04-11 DIAGNOSIS — K219 Gastro-esophageal reflux disease without esophagitis: Secondary | ICD-10-CM

## 2019-04-11 DIAGNOSIS — E7849 Other hyperlipidemia: Secondary | ICD-10-CM | POA: Diagnosis not present

## 2019-04-11 MED ORDER — ALBUTEROL SULFATE HFA 108 (90 BASE) MCG/ACT IN AERS: 2.0000 | INHALATION_SPRAY | Freq: Four times a day (QID) | RESPIRATORY_TRACT | 1 refills | Status: DC | PRN

## 2019-04-11 MED ORDER — SERTRALINE HCL 50 MG PO TABS: ORAL_TABLET | ORAL | 1 refills | Status: DC

## 2019-04-11 MED ORDER — TRELEGY ELLIPTA 100-62.5-25 MCG/INH IN AEPB: 1.0000 | INHALATION_SPRAY | Freq: Every day | RESPIRATORY_TRACT | 5 refills | Status: DC

## 2019-04-11 MED ORDER — LOVASTATIN 40 MG PO TABS: 40.0000 mg | ORAL_TABLET | Freq: Every day | ORAL | 1 refills | Status: DC

## 2019-04-11 MED ORDER — OMEPRAZOLE 20 MG PO CPDR: DELAYED_RELEASE_CAPSULE | ORAL | 1 refills | Status: DC

## 2019-04-11 MED ORDER — FUROSEMIDE 20 MG PO TABS: ORAL_TABLET | ORAL | 1 refills | Status: DC

## 2019-04-11 MED ORDER — LOSARTAN POTASSIUM 100 MG PO TABS: ORAL_TABLET | ORAL | 1 refills | Status: DC

## 2019-04-11 MED ORDER — TRAZODONE HCL 50 MG PO TABS: ORAL_TABLET | ORAL | 2 refills | Status: DC

## 2019-04-11 MED ORDER — ALLOPURINOL 100 MG PO TABS: ORAL_TABLET | ORAL | 1 refills | Status: DC

## 2019-04-11 NOTE — Progress Notes (Signed)
Date:  04/11/2019   Name:  Kirsten Snyder   DOB:  [birth year]   MRN:  [id number]   Chief Complaint: Gastroesophageal Reflux, COPD, Depression, Insomnia, Hypertension, Hyperlipidemia, Gout, and Flu Vaccine  Gastroesophageal Reflux She reports no abdominal pain, no belching, no chest pain, no choking, no coughing, no dysphagia, no early satiety, no globus sensation, no heartburn, no hoarse voice, no nausea, no sore throat, no stridor, no tooth decay, no water brash or no wheezing. This is a chronic problem. The current episode started more than 1 year ago. The problem occurs occasionally. The problem has been waxing and waning. The symptoms are aggravated by certain foods. Pertinent negatives include no anemia, fatigue, melena, muscle weakness, orthopnea or weight loss. She has tried a PPI for the symptoms. The treatment provided moderate relief.  COPD She complains of chest tightness and shortness of breath. There is no cough, difficulty breathing, frequent throat clearing, hemoptysis, hoarse voice, sputum production or wheezing. This is a chronic problem. The current episode started more than 1 year ago. The problem occurs intermittently. Pertinent negatives include no appetite change, chest pain, headaches, heartburn, myalgias, orthopnea, sore throat or weight loss. Her past medical history is significant for COPD.  Depression        This is a chronic problem.  The current episode started more than 1 year ago.   The onset quality is undetermined.   The problem occurs intermittently.  The problem has been gradually improving since onset.  Associated symptoms include helplessness, hopelessness, insomnia, decreased interest and sad.  Associated symptoms include no decreased concentration, no fatigue, not irritable, no restlessness, no appetite change, no body aches, no myalgias, no headaches, no indigestion and no suicidal ideas.  Past treatments include SSRIs - Selective serotonin reuptake  inhibitors. Insomnia Primary symptoms: no fragmented sleep, no sleep disturbance, no somnolence, no frequent awakening.  The onset quality is gradual. The problem has been gradually improving since onset. PMH includes: depression.  Hypertension This is a chronic problem. Associated symptoms include shortness of breath. Pertinent negatives include no chest pain or headaches. There are no associated agents to hypertension. Risk factors for coronary artery disease include dyslipidemia and obesity. Past treatments include diuretics and angiotensin blockers. The current treatment provides moderate improvement.  Hyperlipidemia This is a chronic problem. The current episode started more than 1 year ago. The problem is controlled. Recent lipid tests were reviewed and are normal. Associated symptoms include shortness of breath. Pertinent negatives include no chest pain, focal weakness or myalgias. Current antihyperlipidemic treatment includes statins.    Lab Results  Component Value Date   CREATININE 0.85 09/22/2018   BUN 14 09/22/2018   NA 141 09/22/2018   K 4.5 09/22/2018   CL 97 09/22/2018   CO2 29 09/22/2018   Lab Results  Component Value Date   CHOL 201 (H) 07/26/2018   HDL 39 (L) 07/26/2018   LDLCALC 110 (H) 07/26/2018   TRIG 262 (H) 07/26/2018   CHOLHDL 4.8 (H) 11/02/2017   Lab Results  Component Value Date   TSH 2.39 09/15/2012   No results found for: HGBA1C   Review of Systems  Constitutional: Negative for appetite change, fatigue and weight loss.  HENT: Negative for hoarse voice and sore throat.   Respiratory: Positive for shortness of breath. Negative for cough, hemoptysis, sputum production, choking and wheezing.   Cardiovascular: Negative for chest pain.  Gastrointestinal: Negative for abdominal pain, dysphagia, heartburn, melena and nausea.  Musculoskeletal: Negative for myalgias  and muscle weakness.  Neurological: Negative for focal weakness and headaches.   Psychiatric/Behavioral: Positive for depression. Negative for decreased concentration, sleep disturbance and suicidal ideas. The patient has insomnia.     Patient Active Problem List   Diagnosis Date Noted  . Osteopenia of neck of right femur 06/29/2018  . Breast cancer, right ([place]) 06/17/2016  . Essential hypertension 12/13/2015  . Breast abscess of female 11/07/2015  . Familial multiple lipoprotein-type hyperlipidemia 10/25/2014  . Clinical depression 10/25/2014  . Acid reflux 10/25/2014  . Acute gastrointestinal bleeding 10/25/2014  . Aggrieved 10/25/2014  . Airway hyperreactivity 10/25/2014  . Arthritis of knee 10/25/2014  . H/O: gout 10/25/2014  . Adiposity 10/25/2014  . Arthritis, degenerative 10/25/2014  . H/O adenomatous polyp of colon 03/07/2014    Allergies  Allergen Reactions  . Sulfa Antibiotics     Past Surgical History:  Procedure Laterality Date  . BREAST BIOPSY Bilateral    negative  . BREAST EXCISIONAL BIOPSY Right    right positive 04/2010  . BREAST LUMPECTOMY Right    removed a cancerous lump  . VAGINAL HYSTERECTOMY      Social History   Tobacco Use  . Smoking status: Former Smoker    Packs/day: 0.25    Years: 2.00    Pack years: 0.50    Types: Cigarettes  . Smokeless tobacco: Never Used  . Tobacco comment: smoking cessation materials not required  Substance Use Topics  . Alcohol use: No    Alcohol/week: 0.0 standard drinks  . Drug use: No     Medication list has been reviewed and updated.  Current Meds  Medication Sig  . acetaminophen (TYLENOL) 325 MG tablet Take by mouth.  Marland Kitchen albuterol (VENTOLIN HFA) 108 (90 Base) MCG/ACT inhaler Inhale 2 puffs into the lungs every 6 (six) hours as needed for wheezing or shortness of breath.  . ALLERGY RELIEF 10 MG tablet TAKE (1) TABLET BY MOUTH EVERY DAY  . allopurinol (ZYLOPRIM) 100 MG tablet 1 tablet by mouth daily  . aspirin 81 MG tablet Take 81 mg by mouth daily.  . Calcium Carbonate-Vit D-Min  (CALTRATE 600+D PLUS MINERALS PO) Take 1 tablet by mouth daily.  . Cholecalciferol (VITAMIN D3) 125 MCG (5000 UT) CAPS Take 1 capsule by mouth daily.  . fluticasone (FLONASE) 50 MCG/ACT nasal spray Place 2 sprays into both nostrils daily.  . furosemide (LASIX) 20 MG tablet TAKE (1) TABLET BY MOUTH EVERY DAY  . losartan (COZAAR) 100 MG tablet One tablet daily  . lovastatin (MEVACOR) 40 MG tablet Take 1 tablet (40 mg total) by mouth at bedtime.  . mupirocin ointment (BACTROBAN) 2 % Apply 1 application topically 2 (two) times daily.  Marland Kitchen nystatin (MYCOSTATIN/NYSTOP) powder Apply topically 3 (three) times daily.  Marland Kitchen nystatin cream (MYCOSTATIN) APPLY 1 APPLICATION TOPICALLY TWICE DAILY  . omeprazole (PRILOSEC) 20 MG capsule TAKE ONE (1) CAPSULE EACH DAY.  . sertraline (ZOLOFT) 50 MG tablet TAKE ONE (1) TABLET BY MOUTH ONCE DAILY  . traZODone (DESYREL) 50 MG tablet TAKE (1) TABLET BY MOUTH DAILY AT BEDTIME  . TRELEGY ELLIPTA 100-62.5-25 MCG/INH AEPB INHALE 1 PUFF BY MOUTH EVERY DAY  . vitamin B-12 (CYANOCOBALAMIN) 1000 MCG tablet Take 1,000 mcg by mouth daily.    PHQ 2/9 Scores 04/11/2019 11/10/2018 07/26/2018 12/13/2017  PHQ - 2 Score 0 0 0 0  PHQ- 9 Score 0 0 2 0    BP Readings from Last 3 Encounters:  04/11/19 120/70  11/10/18 110/76  09/29/18 120/80  Physical Exam Constitutional:      General: She is not irritable.    Wt Readings from Last 3 Encounters:  04/11/19 233 lb (105.7 kg)  11/10/18 240 lb (108.9 kg)  09/29/18 247 lb (112 kg)    BP 120/70   Pulse 64   Ht 5\' 3"  (1.6 m)   Wt 233 lb (105.7 kg)   BMI 41.27 kg/m   Assessment and Plan: 1. Mild intermittent asthma without complication Chronic.  Controlled.  Intermittent.  Mild patient is currently controlling with albuterol inhaler as well as Trelegy samples which she very much has noticed improvement.  We will try to get her some more samples but we will submit a Trelegy prescription to see if her insurance may cover. -  albuterol (VENTOLIN HFA) 108 (90 Base) MCG/ACT inhaler; Inhale 2 puffs into the lungs every 6 (six) hours as needed for wheezing or shortness of breath.  Dispense: 18 g; Refill: 1  2. Hyperuricemia Chronic.  Controlled.  Stable.  From hyper uric acidemia which results in acute gout there is been no recent acute gout attacks probably secondary to control with allopurinol 100 mg daily. - allopurinol (ZYLOPRIM) 100 MG tablet; 1 tablet by mouth daily  Dispense: 90 tablet; Refill: 1  3. Essential hypertension Chronic.  Controlled.  Stable.  Continue furosemide 20 mg and losartan 100 mg daily.  Will check a CMP. - furosemide (LASIX) 20 MG tablet; TAKE (1) TABLET BY MOUTH EVERY DAY  Dispense: 90 tablet; Refill: 1 - losartan (COZAAR) 100 MG tablet; One tablet daily  Dispense: 90 tablet; Refill: 1 - Comprehensive Metabolic Panel (CMET)  4. Familial multiple lipoprotein-type hyperlipidemia Chronic.  Controlled.  Stable.  Continue lovastatin 40 mg once a day.  And we will check a lipid panel. - lovastatin (MEVACOR) 40 MG tablet; Take 1 tablet (40 mg total) by mouth at bedtime.  Dispense: 90 tablet; Refill: 1 - Lipid Panel With LDL/HDL Ratio  5. Gastroesophageal reflux disease Chronic.  Controlled.  Continue omeprazole 20 mg once a day. - omeprazole (PRILOSEC) 20 MG capsule; TAKE ONE (1) CAPSULE EACH DAY.  Dispense: 90 capsule; Refill: 1  6. Recurrent major depressive disorder, in partial remission (HCC) Chronic.  Controlled.  Stable.  PHQ was 0 with a gad score of 1.  We will continue sertraline 50 mg once a day. - sertraline (ZOLOFT) 50 MG tablet; TAKE ONE (1) TABLET BY MOUTH ONCE DAILY  Dispense: 90 tablet; Refill: 1  7. Primary insomnia Chronic.  Controlled.  Stable.  Continue trazodone 50 mg nightly patient is tolerating this well and I feel most comfortable with this given the other alternatives. - traZODone (DESYREL) 50 MG tablet; TAKE (1) TABLET BY MOUTH DAILY AT BEDTIME  Dispense: 30 tablet;  Refill: 2  8. Influenza vaccine needed Discussed and administered - Flu Vaccine QUAD High Dose(Fluad)

## 2019-04-12 LAB — COMPREHENSIVE METABOLIC PANEL
ALT: 13 IU/L (ref 0–32)
AST: 18 IU/L (ref 0–40)
Albumin/Globulin Ratio: 2.1 (ref 1.2–2.2)
Albumin: 4.7 g/dL — ABNORMAL HIGH (ref 3.6–4.6)
Alkaline Phosphatase: 120 IU/L — ABNORMAL HIGH (ref 39–117)
BUN/Creatinine Ratio: 11 — ABNORMAL LOW (ref 12–28)
BUN: 9 mg/dL (ref 8–27)
Bilirubin Total: 0.8 mg/dL (ref 0.0–1.2)
CO2: 31 mmol/L — ABNORMAL HIGH (ref 20–29)
Calcium: 9.6 mg/dL (ref 8.7–10.3)
Chloride: 98 mmol/L (ref 96–106)
Creatinine, Ser: 0.85 mg/dL (ref 0.57–1.00)
GFR calc Af Amer: 73 mL/min/{1.73_m2} (ref >59)
GFR calc non Af Amer: 64 mL/min/{1.73_m2} (ref >59)
Globulin, Total: 2.2 g/dL (ref 1.5–4.5)
Glucose: 88 mg/dL (ref 65–99)
Potassium: 4.9 mmol/L (ref 3.5–5.2)
Sodium: 142 mmol/L (ref 134–144)
Total Protein: 6.9 g/dL (ref 6.0–8.5)

## 2019-04-12 LAB — LIPID PANEL WITH LDL/HDL RATIO
Cholesterol, Total: 227 mg/dL — ABNORMAL HIGH (ref 100–199)
HDL: 40 mg/dL (ref >39)
LDL Chol Calc (NIH): 140 mg/dL — ABNORMAL HIGH (ref 0–99)
LDL/HDL Ratio: 3.5 ratio — ABNORMAL HIGH (ref 0.0–3.2)
Triglycerides: 261 mg/dL — ABNORMAL HIGH (ref 0–149)
VLDL Cholesterol Cal: 47 mg/dL — ABNORMAL HIGH (ref 5–40)

## 2019-05-10 ENCOUNTER — Ambulatory Visit (INDEPENDENT_AMBULATORY_CARE_PROVIDER_SITE_OTHER): Payer: Medicare HMO

## 2019-05-10 VITALS — BP 108/68 | HR 77 | Ht 63.0 in | Wt 236.0 lb

## 2019-05-10 DIAGNOSIS — Z0000 Encounter for general adult medical examination without abnormal findings: Secondary | ICD-10-CM

## 2019-05-10 NOTE — Patient Instructions (Signed)
[name] , Thank you for taking time to come for your Medicare Wellness Visit. I appreciate your ongoing commitment to your health goals. Please review the following plan we discussed and let me know if I can assist you in the future.   Screening recommendations/referrals: Colonoscopy: no longer required Mammogram: done 06/03/18. Please call [phone] to schedule your mammogram.  Bone Density: done 01/03/18 Recommended yearly ophthalmology/optometry visit for glaucoma screening and checkup Recommended yearly dental visit for hygiene and checkup  Vaccinations: Influenza vaccine: done 04/11/19 Pneumococcal vaccine: done 07/26/18 Tdap vaccine: done 05/20/15 Shingles vaccine: Shingrix discussed. Please contact your pharmacy for coverage information.   Advanced directives: Advance directive discussed with you today. Even though you declined this today please call our office should you change your mind and we can give you the proper paperwork for you to fill out.  Conditions/risks identified: Covid vaccine information and scheduling [facility] [phone]  Next appointment: Please follow up in one year for your Medicare Annual Wellness visit.     Preventive Care 84 Years and Older, Female Preventive care refers to lifestyle choices and visits with your health care provider that can promote health and wellness. What does preventive care include?  A yearly physical exam. This is also called an annual well check.  Dental exams once or twice a year.  Routine eye exams. Ask your health care provider how often you should have your eyes checked.  Personal lifestyle choices, including:  Daily care of your teeth and gums.  Regular physical activity.  Eating a healthy diet.  Avoiding tobacco and drug use.  Limiting alcohol use.  Practicing safe sex.  Taking low-dose aspirin every day.  Taking vitamin and mineral supplements as  recommended by your health care provider. What happens during an annual well check? The services and screenings done by your health care provider during your annual well check will depend on your age, overall health, lifestyle risk factors, and family history of disease. Counseling  Your health care provider may ask you questions about your:  Alcohol use.  Tobacco use.  Drug use.  Emotional well-being.  Home and relationship well-being.  Sexual activity.  Eating habits.  History of falls.  Memory and ability to understand (cognition).  Work and work Statistician.  Reproductive health. Screening  You may have the following tests or measurements:  Height, weight, and BMI.  Blood pressure.  Lipid and cholesterol levels. These may be checked every 5 years, or more frequently if you are over 65 years old.  Skin check.  Lung cancer screening. You may have this screening every year starting at age 42 if you have a 30-pack-year history of smoking and currently smoke or have quit within the past 15 years.  Fecal occult blood test (FOBT) of the stool. You may have this test every year starting at age 72.  Flexible sigmoidoscopy or colonoscopy. You may have a sigmoidoscopy every 5 years or a colonoscopy every 10 years starting at age 39.  Hepatitis C blood test.  Hepatitis B blood test.  Sexually transmitted disease (STD) testing.  Diabetes screening. This is done by checking your blood sugar (glucose) after you have not eaten for a while (fasting). You may have this done every 1-3 years.  Bone density scan. This is done to screen for osteoporosis. You may have this done starting at age 77.  Mammogram. This may be done every 1-2 years. Talk to your health care  provider about how often you should have regular mammograms. Talk with your health care provider about your test results, treatment options, and if necessary, the need for more tests. Vaccines  Your health care  provider may recommend certain vaccines, such as:  Influenza vaccine. This is recommended every year.  Tetanus, diphtheria, and acellular pertussis (Tdap, Td) vaccine. You may need a Td booster every 10 years.  Zoster vaccine. You may need this after age 25.  Pneumococcal 13-valent conjugate (PCV13) vaccine. One dose is recommended after age 60.  Pneumococcal polysaccharide (PPSV23) vaccine. One dose is recommended after age 17. Talk to your health care provider about which screenings and vaccines you need and how often you need them. This information is not intended to replace advice given to you by your health care provider. Make sure you discuss any questions you have with your health care provider. Document Released: 05/03/2015 Document Revised: 12/25/2015 Document Reviewed: 02/05/2015 Elsevier Interactive Patient Education  2017 [employer] Prevention in the Home Falls can cause injuries. They can happen to people of all ages. There are many things you can do to make your home safe and to help prevent falls. What can I do on the outside of my home?  Regularly fix the edges of walkways and driveways and fix any cracks.  Remove anything that might make you trip as you walk through a door, such as a raised step or threshold.  Trim any bushes or trees on the path to your home.  Use bright outdoor lighting.  Clear any walking paths of anything that might make someone trip, such as rocks or tools.  Regularly check to see if handrails are loose or broken. Make sure that both sides of any steps have handrails.  Any raised decks and porches should have guardrails on the edges.  Have any leaves, snow, or ice cleared regularly.  Use sand or salt on walking paths during winter.  Clean up any spills in your garage right away. This includes oil or grease spills. What can I do in the bathroom?  Use night lights.  Install grab bars by the toilet and in the tub and shower. Do  not use towel bars as grab bars.  Use non-skid mats or decals in the tub or shower.  If you need to sit down in the shower, use a plastic, non-slip stool.  Keep the floor dry. Clean up any water that spills on the floor as soon as it happens.  Remove soap buildup in the tub or shower regularly.  Attach bath mats securely with double-sided non-slip rug tape.  Do not have throw rugs and other things on the floor that can make you trip. What can I do in the bedroom?  Use night lights.  Make sure that you have a light by your bed that is easy to reach.  Do not use any sheets or blankets that are too big for your bed. They should not hang down onto the floor.  Have a firm chair that has side arms. You can use this for support while you get dressed.  Do not have throw rugs and other things on the floor that can make you trip. What can I do in the kitchen?  Clean up any spills right away.  Avoid walking on wet floors.  Keep items that you use a lot in easy-to-reach places.  If you need to reach something above you, use a strong step stool that has a grab bar.  Keep electrical cords out of the way.  Do not use floor polish or wax that makes floors slippery. If you must use wax, use non-skid floor wax.  Do not have throw rugs and other things on the floor that can make you trip. What can I do with my stairs?  Do not leave any items on the stairs.  Make sure that there are handrails on both sides of the stairs and use them. Fix handrails that are broken or loose. Make sure that handrails are as long as the stairways.  Check any carpeting to make sure that it is firmly attached to the stairs. Fix any carpet that is loose or worn.  Avoid having throw rugs at the top or bottom of the stairs. If you do have throw rugs, attach them to the floor with carpet tape.  Make sure that you have a light switch at the top of the stairs and the bottom of the stairs. If you do not have them,  ask someone to add them for you. What else can I do to help prevent falls?  Wear shoes that:  Do not have high heels.  Have rubber bottoms.  Are comfortable and fit you well.  Are closed at the toe. Do not wear sandals.  If you use a stepladder:  Make sure that it is fully opened. Do not climb a closed stepladder.  Make sure that both sides of the stepladder are locked into place.  Ask someone to hold it for you, if possible.  Clearly mark and make sure that you can see:  Any grab bars or handrails.  First and last steps.  Where the edge of each step is.  Use tools that help you move around (mobility aids) if they are needed. These include:  Canes.  Walkers.  Scooters.  Crutches.  Turn on the lights when you go into a dark area. Replace any light bulbs as soon as they burn out.  Set up your furniture so you have a clear path. Avoid moving your furniture around.  If any of your floors are uneven, fix them.  If there are any pets around you, be aware of where they are.  Review your medicines with your doctor. Some medicines can make you feel dizzy. This can increase your chance of falling. Ask your doctor what other things that you can do to help prevent falls. This information is not intended to replace advice given to you by your health care provider. Make sure you discuss any questions you have with your health care provider. Document Released: 01/31/2009 Document Revised: 09/12/2015 Document Reviewed: 05/11/2014 Elsevier Interactive Patient Education  2017 [employer].

## 2019-05-10 NOTE — Progress Notes (Signed)
Subjective:   Kirsten Snyder is a 86 y.o. female who presents for Medicare Annual (Subsequent) preventive examination.  Review of Systems:   Cardiac Risk Factors include: advanced age (>15men, >10 women);dyslipidemia;hypertension;obesity (BMI >30kg/m2);sedentary lifestyle     Objective:     Vitals: BP 108/68 (BP Location: Left Arm, Patient Position: Sitting, Cuff Size: Large)   Pulse 77   Ht 5\' 3"  (1.6 m)   Wt 236 lb (107 kg)   BMI 41.81 kg/m   Body mass index is 41.81 kg/m.  Advanced Directives 05/10/2019 08/03/2018 12/13/2017 06/18/2017 06/19/2016 03/24/2016 11/07/2015  Does Patient Have a Medical Advance Directive? No No No No No No No  Type of Advance Directive - - - - - - -  Copy of Healthcare Power of Attorney in Chart? - - - - - - -  Would patient like information on creating a medical advance directive? No - Patient declined No - Patient declined Yes (MAU/Ambulatory/Procedural Areas - Information given) No - Patient declined No - Patient declined - No - patient declined information    Tobacco Social History   Tobacco Use  Smoking Status Former Smoker  . Packs/day: 0.25  . Years: 2.00  . Pack years: 0.50  . Types: Cigarettes  Smokeless Tobacco Never Used  Tobacco Comment   smoking cessation materials not required     Counseling given: Not Answered Comment: smoking cessation materials not required   Clinical Intake:  Pre-visit preparation completed: Yes  Pain : (left ankle pain)     BMI - recorded: 41.81 Nutritional Status: BMI > 30  Obese Nutritional Risks: None Diabetes: No  How often do you need to have someone help you when you read instructions, pamphlets, or other written materials from your doctor or pharmacy?: 1 - Never  Interpreter Needed?: No  Information entered by :: Clemetine Marker LPN  Past Medical History:  Diagnosis Date  . Allergy   . Asthma   . Cancer [facility]) 2012   right breast cancer lumpectomy with rad tx  . Depression   . Edema     . GERD (gastroesophageal reflux disease)   . Gout   . Hyperlipidemia   . Hypertension   . Personal history of radiation therapy    Past Surgical History:  Procedure Laterality Date  . BREAST BIOPSY Bilateral    negative  . BREAST EXCISIONAL BIOPSY Right    right positive 04/2010  . BREAST LUMPECTOMY Right    removed a cancerous lump  . VAGINAL HYSTERECTOMY     Family History  Problem Relation Age of Onset  . Leukemia Mother   . Heart disease Father   . Breast cancer Neg Hx    Social History   Socioeconomic History  . Marital status: Widowed    Spouse name: Not on file  . Number of children: 2  . Years of education: some college  . Highest education level: 12th grade  Occupational History  . Occupation: Retired  Tobacco Use  . Smoking status: Former Smoker    Packs/day: 0.25    Years: 2.00    Pack years: 0.50    Types: Cigarettes  . Smokeless tobacco: Never Used  . Tobacco comment: smoking cessation materials not required  Substance and Sexual Activity  . Alcohol use: No    Alcohol/week: 0.0 standard drinks  . Drug use: No  . Sexual activity: Not Currently  Other Topics Concern  . Not on file  Social History Narrative  . Not on file  Social Determinants of Health   Financial Resource Strain: Low Risk   . Difficulty of Paying Living Expenses: Not hard at all  Food Insecurity: No Food Insecurity  . Worried About Charity fundraiser in the Last Year: Never true  . Ran Out of Food in the Last Year: Never true  Transportation Needs: No Transportation Needs  . Lack of Transportation (Medical): No  . Lack of Transportation (Non-Medical): No  Physical Activity: Inactive  . Days of Exercise per Week: 0 days  . Minutes of Exercise per Session: 0 min  Stress: No Stress Concern Present  . Feeling of Stress : Not at all  Social Connections: Unknown  . Frequency of Communication with Friends and Family: Patient refused  . Frequency of Social Gatherings with  Friends and Family: Patient refused  . Attends Religious Services: Patient refused  . Active Member of Clubs or Organizations: Patient refused  . Attends Archivist Meetings: Patient refused  . Marital Status: Widowed    Outpatient Encounter Medications as of 05/10/2019  Medication Sig  . acetaminophen (TYLENOL) 325 MG tablet Take by mouth.  Marland Kitchen albuterol (VENTOLIN HFA) 108 (90 Base) MCG/ACT inhaler Inhale 2 puffs into the lungs every 6 (six) hours as needed for wheezing or shortness of breath.  . ALLERGY RELIEF 10 MG tablet TAKE (1) TABLET BY MOUTH EVERY DAY  . allopurinol (ZYLOPRIM) 100 MG tablet 1 tablet by mouth daily  . aspirin 81 MG tablet Take 81 mg by mouth daily.  . Calcium Carbonate-Vit D-Min (CALTRATE 600+D PLUS MINERALS PO) Take 1 tablet by mouth daily.  . Cholecalciferol (VITAMIN D3) 125 MCG (5000 UT) CAPS Take 1 capsule by mouth daily.  . fluticasone (FLONASE) 50 MCG/ACT nasal spray Place 2 sprays into both nostrils daily.  . Fluticasone-Umeclidin-Vilant (TRELEGY ELLIPTA) 100-62.5-25 MCG/INH AEPB Inhale 1 puff into the lungs daily.  . furosemide (LASIX) 20 MG tablet TAKE (1) TABLET BY MOUTH EVERY DAY  . losartan (COZAAR) 100 MG tablet One tablet daily  . lovastatin (MEVACOR) 40 MG tablet Take 1 tablet (40 mg total) by mouth at bedtime.  . mupirocin ointment (BACTROBAN) 2 % Apply 1 application topically 2 (two) times daily.  . Niacin CR 1000 MG TBCR Take by mouth.  . nystatin (MYCOSTATIN/NYSTOP) powder Apply topically 3 (three) times daily.  Marland Kitchen nystatin cream (MYCOSTATIN) APPLY 1 APPLICATION TOPICALLY TWICE DAILY  . omeprazole (PRILOSEC) 20 MG capsule TAKE ONE (1) CAPSULE EACH DAY.  . sertraline (ZOLOFT) 50 MG tablet TAKE ONE (1) TABLET BY MOUTH ONCE DAILY  . traZODone (DESYREL) 50 MG tablet TAKE (1) TABLET BY MOUTH DAILY AT BEDTIME  . vitamin B-12 (CYANOCOBALAMIN) 1000 MCG tablet Take 1,000 mcg by mouth daily.  . [DISCONTINUED] Fluticasone-Salmeterol (ADVAIR DISKUS)  100-50 MCG/DOSE AEPB Inhale 1 puff into the lungs 2 (two) times daily. (Patient not taking: Reported on 11/10/2018)   No facility-administered encounter medications on file as of 05/10/2019.    Activities of Daily Living In your present state of health, do you have any difficulty performing the following activities: 05/10/2019  Hearing? N  Comment declines hearing aids  Vision? N  Difficulty concentrating or making decisions? N  Walking or climbing stairs? N  Dressing or bathing? N  Doing errands, shopping? N  Preparing Food and eating ? N  Using the Toilet? N  In the past six months, have you accidently leaked urine? Y  Comment wears depends for protection  Do you have problems with loss of bowel control?  N  Managing your Medications? N  Managing your Finances? N  Housekeeping or managing your Housekeeping? N  Some recent data might be hidden    Patient Care Team: [name], MD as PCP - General (Family Medicine) Troxler, Rodman Key, DPM as Consulting Physician (Podiatry) Lloyd Huger, MD as Consulting Physician (Oncology)    Assessment:   This is a routine wellness examination for [place].  Exercise Activities and Dietary recommendations Current Exercise Habits: The patient does not participate in regular exercise at present, Exercise limited by: orthopedic condition(s)  Goals    . DIET - INCREASE WATER INTAKE     Recommend to drink at least 6-8 8oz glasses of water per day.       Fall Risk Fall Risk  05/10/2019 07/26/2018 12/13/2017 11/02/2017 09/03/2017  Falls in the past year? 0 0 No No No  Number falls in past yr: 0 - - - -  Injury with Fall? 0 - - - -  Risk for fall due to : Impaired balance/gait;Impaired mobility - Impaired vision;Impaired balance/gait - -  Risk for fall due to: Comment - - wears eyeglasses; knee and foot pain; ambulates with cane - -  Follow up Falls prevention discussed Falls evaluation completed - - -   FALL RISK PREVENTION PERTAINING TO  THE HOME:  Any stairs in or around the home? Yes  If so, do they handrails? Yes   Home free of loose throw rugs in walkways, pet beds, electrical cords, etc? Yes  Adequate lighting in your home to reduce risk of falls? Yes   ASSISTIVE DEVICES UTILIZED TO PREVENT FALLS:  Life alert? No  Use of a cane, walker or w/c? Yes  Grab bars in the bathroom? Yes  Shower chair or bench in shower? No  Elevated toilet seat or a handicapped toilet? No   DME ORDERS:  DME order needed?  No   TIMED UP AND GO:  Was the test performed? Yes .  Length of time to ambulate 10 feet: 7 sec.   GAIT:  Appearance of gait:  Gait slow, steady and with the use of an assistive device.   Education: Fall risk prevention has been discussed.  Intervention(s) required? No   Depression Screen PHQ 2/9 Scores 05/10/2019 04/11/2019 11/10/2018 07/26/2018  PHQ - 2 Score 0 0 0 0  PHQ- 9 Score - 0 0 2     Cognitive Function     6CIT Screen 05/10/2019 12/13/2017  What Year? 0 points 0 points  What month? 0 points 0 points  What time? 0 points 0 points  Count back from 20 0 points 0 points  Months in reverse 4 points 0 points  Repeat phrase 4 points 2 points  Total Score 8 2    Immunization History  Administered Date(s) Administered  . Fluad Quad(high Dose 65+) 04/11/2019  . Influenza, High Dose Seasonal PF 12/31/2016  . Pneumococcal Conjugate-13 12/31/2016  . Pneumococcal Polysaccharide-23 07/26/2018  . Tdap 05/20/2015    Qualifies for Shingles Vaccine? Yes . Due for Shingrix. Education has been provided regarding the importance of this vaccine. Pt has been advised to call insurance company to determine out of pocket expense. Advised may also receive vaccine at local pharmacy or Health Dept. Verbalized acceptance and understanding.  Tdap: Up to date   Flu Vaccine: Up to date  Pneumococcal Vaccine: Up to date    Screening Tests Health Maintenance  Topic Date Due  . MAMMOGRAM  06/04/2019  .  TETANUS/TDAP  05/19/2025  . INFLUENZA VACCINE  Completed  . DEXA SCAN  Completed  . PNA vac Low Risk Adult  Completed    Cancer Screenings:  Colorectal Screening:  No longer required.   Mammogram: Completed 06/03/18. Repeat every year. Ordered 08/03/18. Pt provided with contact information and advised to call to schedule appt.   Bone Density: Completed 01/03/18. Results reflect  OSTEOPENIA. Repeat every 2 years.   Lung Cancer Screening: (Low Dose CT Chest recommended if Age 23-80 years, 30 pack-year currently smoking OR have quit w/in 15years.) does not qualify.   Additional Screening:  Hepatitis C Screening: no longer required  Vision Screening: Recommended annual ophthalmology exams for early detection of glaucoma and other disorders of the eye. Is the patient up to date with their annual eye exam?  Yes  Who is the provider or what is the name of the office in which the pt attends annual eye exams? [place] Screening: Recommended annual dental exams for proper oral hygiene  Community Resource Referral:  CRR required this visit?  No      Plan:     I have personally reviewed and addressed the Medicare Annual Wellness questionnaire and have noted the following in the patient's chart:  A. Medical and social history B. Use of alcohol, tobacco or illicit drugs  C. Current medications and supplements D. Functional ability and status E.  Nutritional status F.  Physical activity G. Advance directives H. List of other physicians I.  Hospitalizations, surgeries, and ER visits in previous 12 months J.  [place] such as hearing and vision if needed, cognitive and depression L. Referrals and appointments   In addition, I have reviewed and discussed with patient certain preventive protocols, quality metrics, and best practice recommendations. A written personalized care plan for preventive services as well as general preventive health recommendations were  provided to patient.   Signed,  Clemetine Marker, LPN Nurse Health Advisor   Nurse Notes: none

## 2019-06-19 ENCOUNTER — Other Ambulatory Visit: Payer: Self-pay | Admitting: Family Medicine

## 2019-06-19 DIAGNOSIS — Z1231 Encounter for screening mammogram for malignant neoplasm of breast: Secondary | ICD-10-CM

## 2019-07-04 ENCOUNTER — Other Ambulatory Visit: Payer: Self-pay | Admitting: Family Medicine

## 2019-07-04 ENCOUNTER — Ambulatory Visit
Admission: RE | Admit: 2019-07-04 | Discharge: 2019-07-04 | Disposition: A | Discharge status: Home / Self Care | Payer: Medicare HMO | Source: Ambulatory Visit | Attending: Family Medicine | Admitting: Family Medicine

## 2019-07-04 DIAGNOSIS — J301 Allergic rhinitis due to pollen: Secondary | ICD-10-CM

## 2019-07-04 DIAGNOSIS — Z1231 Encounter for screening mammogram for malignant neoplasm of breast: Secondary | ICD-10-CM | POA: Diagnosis not present

## 2019-07-04 DIAGNOSIS — B379 Candidiasis, unspecified: Secondary | ICD-10-CM

## 2019-07-04 HISTORY — DX: Malignant neoplasm of unspecified site of unspecified female breast: C50.919

## 2019-08-15 ENCOUNTER — Other Ambulatory Visit: Payer: Self-pay | Admitting: Family Medicine

## 2019-08-15 DIAGNOSIS — F3341 Major depressive disorder, recurrent, in partial remission: Secondary | ICD-10-CM

## 2019-09-04 ENCOUNTER — Other Ambulatory Visit: Payer: Self-pay | Admitting: Family Medicine

## 2019-09-04 DIAGNOSIS — E7849 Other hyperlipidemia: Secondary | ICD-10-CM

## 2019-09-14 ENCOUNTER — Encounter: Payer: Self-pay | Admitting: Family Medicine

## 2019-09-14 ENCOUNTER — Other Ambulatory Visit: Payer: Self-pay

## 2019-09-14 ENCOUNTER — Ambulatory Visit (INDEPENDENT_AMBULATORY_CARE_PROVIDER_SITE_OTHER): Payer: PRIVATE HEALTH INSURANCE | Admitting: Family Medicine

## 2019-09-14 DIAGNOSIS — R32 Unspecified urinary incontinence: Secondary | ICD-10-CM

## 2019-09-14 DIAGNOSIS — M5030 Other cervical disc degeneration, unspecified cervical region: Secondary | ICD-10-CM | POA: Diagnosis not present

## 2019-09-14 DIAGNOSIS — R509 Fever, unspecified: Secondary | ICD-10-CM | POA: Diagnosis not present

## 2019-09-14 DIAGNOSIS — R2681 Unsteadiness on feet: Secondary | ICD-10-CM | POA: Diagnosis not present

## 2019-09-14 DIAGNOSIS — R4182 Altered mental status, unspecified: Secondary | ICD-10-CM | POA: Diagnosis not present

## 2019-09-14 DIAGNOSIS — R404 Transient alteration of awareness: Secondary | ICD-10-CM | POA: Diagnosis not present

## 2019-09-14 DIAGNOSIS — M47812 Spondylosis without myelopathy or radiculopathy, cervical region: Secondary | ICD-10-CM | POA: Diagnosis not present

## 2019-09-14 DIAGNOSIS — R296 Repeated falls: Secondary | ICD-10-CM | POA: Diagnosis not present

## 2019-09-14 NOTE — Progress Notes (Signed)
Date:  09/14/2019   Name:  Kirsten Snyder   DOB:  [birth year]   MRN:  [id number]   Chief Complaint: No chief complaint on file.  Urinary Frequency  This is a new problem. The current episode started 1 to 4 weeks ago. The problem occurs every urination. The problem has been waxing and waning. The quality of the pain is described as burning. The pain is mild. Maximum temperature: 99.0. The fever has been present for less than 1 day. Associated symptoms include chills and frequency.    Lab Results  Component Value Date   CREATININE 0.85 04/11/2019   BUN 9 04/11/2019   NA 142 04/11/2019   K 4.9 04/11/2019   CL 98 04/11/2019   CO2 31 (H) 04/11/2019   Lab Results  Component Value Date   CHOL 227 (H) 04/11/2019   HDL 40 04/11/2019   LDLCALC 140 (H) 04/11/2019   TRIG 261 (H) 04/11/2019   CHOLHDL 4.8 (H) 11/02/2017   Lab Results  Component Value Date   TSH 2.39 09/15/2012   No results found for: HGBA1C Lab Results  Component Value Date   WBC 15.0 (H) 03/24/2016   HGB 12.0 03/24/2016   HCT 34.6 (L) 03/24/2016   MCV 93.5 03/24/2016   PLT 345 03/24/2016   Lab Results  Component Value Date   ALT 13 04/11/2019   AST 18 04/11/2019   ALKPHOS 120 (H) 04/11/2019   BILITOT 0.8 04/11/2019     Review of Systems  Constitutional: Positive for chills.  Genitourinary: Positive for frequency.    Patient Active Problem List   Diagnosis Date Noted  . Atherosclerosis of abdominal aorta ([place]) 10/12/2018  . SOBOE (shortness of breath on exertion) 10/12/2018  . Osteopenia of neck of right femur 06/29/2018  . Breast cancer, right ([place]) 06/17/2016  . Essential hypertension 12/13/2015  . Breast abscess of female 11/07/2015  . Familial multiple lipoprotein-type hyperlipidemia 10/25/2014  . Clinical depression 10/25/2014  . Acid reflux 10/25/2014  . Acute gastrointestinal bleeding 10/25/2014  . Aggrieved 10/25/2014  . Airway hyperreactivity 10/25/2014  . Arthritis of knee 10/25/2014   . H/O: gout 10/25/2014  . Adiposity 10/25/2014  . Arthritis, degenerative 10/25/2014  . H/O adenomatous polyp of colon 03/07/2014    Allergies  Allergen Reactions  . Sulfa Antibiotics     Past Surgical History:  Procedure Laterality Date  . BREAST BIOPSY Bilateral    negative  . BREAST EXCISIONAL BIOPSY Right    right positive 04/2010  . BREAST LUMPECTOMY Right    removed a cancerous lump  . VAGINAL HYSTERECTOMY      Social History   Tobacco Use  . Smoking status: Former Smoker    Packs/day: 0.25    Years: 2.00    Pack years: 0.50    Types: Cigarettes  . Smokeless tobacco: Never Used  . Tobacco comment: smoking cessation materials not required  Substance Use Topics  . Alcohol use: No    Alcohol/week: 0.0 standard drinks  . Drug use: No     Medication list has been reviewed and updated.  Current Meds  Medication Sig  . acetaminophen (TYLENOL) 325 MG tablet Take by mouth.  . ALLERGY RELIEF 10 MG tablet TAKE (1) TABLET BY MOUTH EVERY DAY  . allopurinol (ZYLOPRIM) 100 MG tablet 1 tablet by mouth daily  . aspirin 81 MG tablet Take 81 mg by mouth daily.  . Calcium Carbonate-Vit D-Min (CALTRATE 600+D PLUS MINERALS PO) Take 1 tablet by mouth  daily.  . Cholecalciferol (VITAMIN D3) 125 MCG (5000 UT) CAPS Take 1 capsule by mouth daily.  . fluticasone (FLONASE) 50 MCG/ACT nasal spray Place 2 sprays into both nostrils daily.  . Fluticasone-Umeclidin-Vilant (TRELEGY ELLIPTA) 100-62.5-25 MCG/INH AEPB Inhale 1 puff into the lungs daily.  . furosemide (LASIX) 20 MG tablet TAKE (1) TABLET BY MOUTH EVERY DAY  . losartan (COZAAR) 100 MG tablet One tablet daily  . lovastatin (MEVACOR) 40 MG tablet TAKE (1) TABLET BY MOUTH DAILY AT BEDTIME  . mupirocin ointment (BACTROBAN) 2 % Apply 1 application topically 2 (two) times daily.  . Niacin CR 1000 MG TBCR Take by mouth.  . nystatin cream (MYCOSTATIN) APPLY TO AFFECTED AREA TWICE A DAY  . omeprazole (PRILOSEC) 20 MG capsule TAKE ONE  (1) CAPSULE EACH DAY.  . sertraline (ZOLOFT) 50 MG tablet TAKE (1) TABLET BY MOUTH EVERY DAY  . traZODone (DESYREL) 50 MG tablet TAKE (1) TABLET BY MOUTH DAILY AT BEDTIME  . vitamin B-12 (CYANOCOBALAMIN) 1000 MCG tablet Take 1,000 mcg by mouth daily.    PHQ 2/9 Scores 09/14/2019 05/10/2019 04/11/2019 11/10/2018  PHQ - 2 Score 0 0 0 0  PHQ- 9 Score 2 - 0 0    BP Readings from Last 3 Encounters:  05/10/19 108/68  04/11/19 120/70  11/10/18 110/76    Physical Exam Vitals and nursing note reviewed.  Constitutional:      General: She is not in acute distress.    Appearance: She is not diaphoretic.  HENT:     Head: Normocephalic and atraumatic.     Right Ear: External ear normal.     Left Ear: External ear normal.     Nose: Nose normal.  Eyes:     General:        Right eye: No discharge.        Left eye: No discharge.     Conjunctiva/sclera: Conjunctivae normal.     Pupils: Pupils are equal, round, and reactive to light.  Neck:     Thyroid: No thyromegaly.     Vascular: No JVD.  Cardiovascular:     Rate and Rhythm: Normal rate and regular rhythm.     Heart sounds: Normal heart sounds, S1 normal and S2 normal. No murmur. No systolic murmur. No diastolic murmur. No friction rub. No gallop. No S3 or S4 sounds.   Pulmonary:     Effort: Pulmonary effort is normal.     Breath sounds: Normal breath sounds. No rhonchi or rales.  Abdominal:     General: Bowel sounds are normal.     Palpations: Abdomen is soft. There is no mass.     Tenderness: There is no abdominal tenderness. There is no guarding.  Musculoskeletal:        General: Normal range of motion.     Cervical back: Normal range of motion and neck supple.  Lymphadenopathy:     Cervical: No cervical adenopathy.  Skin:    General: Skin is warm and dry.  Neurological:     Mental Status: She is alert.     Deep Tendon Reflexes: Reflexes are normal and symmetric.     Wt Readings from Last 3 Encounters:  05/10/19 236 lb (107  kg)  04/11/19 233 lb (105.7 kg)  11/10/18 240 lb (108.9 kg)    There were no vitals taken for this visit.  Assessment and Plan: 1. Fever and chills New onset/concern sepsis  2. Urinary incontinence, unspecified type Two weeks / New onset  3.  Transient alteration of awareness Not as engaging  4. Repeated falls Several in past week.   5.  Unsteady gait when walking Unable to ambulate on own.  To er setting for suspected pyelo/sepsis.

## 2019-09-15 DIAGNOSIS — G9389 Other specified disorders of brain: Secondary | ICD-10-CM | POA: Diagnosis not present

## 2019-09-15 DIAGNOSIS — I672 Cerebral atherosclerosis: Secondary | ICD-10-CM | POA: Diagnosis not present

## 2019-09-15 DIAGNOSIS — M109 Gout, unspecified: Secondary | ICD-10-CM | POA: Diagnosis not present

## 2019-09-15 DIAGNOSIS — M6281 Muscle weakness (generalized): Secondary | ICD-10-CM | POA: Diagnosis not present

## 2019-09-15 DIAGNOSIS — R110 Nausea: Secondary | ICD-10-CM | POA: Diagnosis not present

## 2019-09-15 DIAGNOSIS — D329 Benign neoplasm of meninges, unspecified: Secondary | ICD-10-CM | POA: Diagnosis not present

## 2019-09-15 DIAGNOSIS — Z20822 Contact with and (suspected) exposure to covid-19: Secondary | ICD-10-CM | POA: Diagnosis not present

## 2019-09-15 DIAGNOSIS — N390 Urinary tract infection, site not specified: Secondary | ICD-10-CM | POA: Diagnosis not present

## 2019-09-15 DIAGNOSIS — M5030 Other cervical disc degeneration, unspecified cervical region: Secondary | ICD-10-CM | POA: Diagnosis not present

## 2019-09-15 DIAGNOSIS — R3915 Urgency of urination: Secondary | ICD-10-CM | POA: Insufficient documentation

## 2019-09-15 DIAGNOSIS — Z882 Allergy status to sulfonamides status: Secondary | ICD-10-CM | POA: Diagnosis not present

## 2019-09-15 DIAGNOSIS — R5381 Other malaise: Secondary | ICD-10-CM | POA: Diagnosis not present

## 2019-09-15 DIAGNOSIS — R4182 Altered mental status, unspecified: Secondary | ICD-10-CM | POA: Diagnosis not present

## 2019-09-15 DIAGNOSIS — R9402 Abnormal brain scan: Secondary | ICD-10-CM | POA: Diagnosis not present

## 2019-09-15 DIAGNOSIS — F329 Major depressive disorder, single episode, unspecified: Secondary | ICD-10-CM | POA: Diagnosis not present

## 2019-09-15 DIAGNOSIS — M47812 Spondylosis without myelopathy or radiculopathy, cervical region: Secondary | ICD-10-CM | POA: Diagnosis not present

## 2019-09-15 DIAGNOSIS — D320 Benign neoplasm of cerebral meninges: Secondary | ICD-10-CM | POA: Diagnosis not present

## 2019-09-15 DIAGNOSIS — R930 Abnormal findings on diagnostic imaging of skull and head, not elsewhere classified: Secondary | ICD-10-CM | POA: Diagnosis not present

## 2019-09-15 DIAGNOSIS — D496 Neoplasm of unspecified behavior of brain: Secondary | ICD-10-CM | POA: Diagnosis not present

## 2019-09-15 DIAGNOSIS — G936 Cerebral edema: Secondary | ICD-10-CM | POA: Diagnosis not present

## 2019-09-15 DIAGNOSIS — F419 Anxiety disorder, unspecified: Secondary | ICD-10-CM | POA: Diagnosis not present

## 2019-09-15 DIAGNOSIS — G319 Degenerative disease of nervous system, unspecified: Secondary | ICD-10-CM | POA: Diagnosis not present

## 2019-09-15 DIAGNOSIS — Z9889 Other specified postprocedural states: Secondary | ICD-10-CM | POA: Diagnosis not present

## 2019-09-15 DIAGNOSIS — I10 Essential (primary) hypertension: Secondary | ICD-10-CM | POA: Diagnosis not present

## 2019-09-15 DIAGNOSIS — R569 Unspecified convulsions: Secondary | ICD-10-CM | POA: Diagnosis not present

## 2019-09-15 DIAGNOSIS — R9401 Abnormal electroencephalogram [EEG]: Secondary | ICD-10-CM | POA: Diagnosis not present

## 2019-09-15 DIAGNOSIS — R279 Unspecified lack of coordination: Secondary | ICD-10-CM | POA: Diagnosis not present

## 2019-09-15 DIAGNOSIS — R29818 Other symptoms and signs involving the nervous system: Secondary | ICD-10-CM | POA: Diagnosis not present

## 2019-09-15 MED ORDER — MORPHINE SULFATE 2 MG/ML IJ SOLN
2.00 | INTRAMUSCULAR | Status: DC
Start: ? — End: 2019-09-15

## 2019-09-15 MED ORDER — ACETAMINOPHEN 325 MG PO TABS
650.00 | ORAL_TABLET | ORAL | Status: DC
Start: ? — End: 2019-09-15

## 2019-09-15 MED ORDER — OXYCODONE-ACETAMINOPHEN 5-325 MG PO TABS
1.00 | ORAL_TABLET | ORAL | Status: DC
Start: ? — End: 2019-09-15

## 2019-09-15 MED ORDER — SERTRALINE HCL 50 MG PO TABS
50.00 | ORAL_TABLET | ORAL | Status: DC
Start: ? — End: 2019-09-15

## 2019-09-15 MED ORDER — INSULIN REGULAR HUMAN 100 UNIT/ML IJ SOLN
0.00 | INTRAMUSCULAR | Status: DC
Start: 2019-09-22 — End: 2019-09-15

## 2019-09-15 MED ORDER — ALLOPURINOL 100 MG PO TABS
100.00 | ORAL_TABLET | ORAL | Status: DC
Start: ? — End: 2019-09-15

## 2019-09-15 MED ORDER — DEXTROSE 50 % IV SOLN
12.50 | INTRAVENOUS | Status: DC
Start: ? — End: 2019-09-15

## 2019-09-15 MED ORDER — LOSARTAN POTASSIUM 100 MG PO TABS
100.00 | ORAL_TABLET | ORAL | Status: DC
Start: ? — End: 2019-09-15

## 2019-09-15 MED ORDER — DEXAMETHASONE 4 MG PO TABS
4.00 | ORAL_TABLET | ORAL | Status: DC
Start: 2019-09-15 — End: 2019-09-15

## 2019-09-15 MED ORDER — PRAVASTATIN SODIUM 40 MG PO TABS
40.00 | ORAL_TABLET | ORAL | Status: DC
Start: 2019-09-15 — End: 2019-09-15

## 2019-09-15 MED ORDER — SERTRALINE HCL 50 MG PO TABS
50.00 | ORAL_TABLET | ORAL | Status: DC
Start: 2019-09-23 — End: 2019-09-15

## 2019-09-15 MED ORDER — FLUTICASONE-UMECLIDIN-VILANT 100-62.5-25 MCG/INH IN AEPB
1.00 | INHALATION_SPRAY | RESPIRATORY_TRACT | Status: DC
Start: ? — End: 2019-09-15

## 2019-09-15 MED ORDER — DSS 100 MG PO CAPS
100.00 | ORAL_CAPSULE | ORAL | Status: DC
Start: 2019-09-15 — End: 2019-09-15

## 2019-09-15 MED ORDER — TRAZODONE HCL 50 MG PO TABS
50.00 | ORAL_TABLET | ORAL | Status: DC
Start: 2019-09-22 — End: 2019-09-15

## 2019-09-15 MED ORDER — ONDANSETRON HCL 4 MG/2ML IJ SOLN
4.00 | INTRAMUSCULAR | Status: DC
Start: ? — End: 2019-09-15

## 2019-09-15 MED ORDER — ALBUTEROL SULFATE HFA 108 (90 BASE) MCG/ACT IN AERS
2.00 | INHALATION_SPRAY | RESPIRATORY_TRACT | Status: DC
Start: ? — End: 2019-09-15

## 2019-09-15 MED ORDER — SODIUM CHLORIDE 0.9 % IV SOLN
75.00 | INTRAVENOUS | Status: DC
Start: ? — End: 2019-09-15

## 2019-09-15 MED ORDER — PANTOPRAZOLE SODIUM 20 MG PO TBEC
20.00 | DELAYED_RELEASE_TABLET | ORAL | Status: DC
Start: ? — End: 2019-09-15

## 2019-09-19 MED ORDER — SENNOSIDES 8.6 MG PO TABS
2.00 | ORAL_TABLET | ORAL | Status: DC
Start: 2019-09-20 — End: 2019-09-19

## 2019-09-19 MED ORDER — UMECLIDINIUM BROMIDE 62.5 MCG/INH IN AEPB
1.00 | INHALATION_SPRAY | RESPIRATORY_TRACT | Status: DC
Start: 2019-09-23 — End: 2019-09-19

## 2019-09-19 MED ORDER — LOSARTAN POTASSIUM 100 MG PO TABS
100.00 | ORAL_TABLET | ORAL | Status: DC
Start: 2019-09-23 — End: 2019-09-19

## 2019-09-19 MED ORDER — OXYCODONE HCL 5 MG PO TABS
5.00 | ORAL_TABLET | ORAL | Status: DC
Start: ? — End: 2019-09-19

## 2019-09-19 MED ORDER — PRAVASTATIN SODIUM 40 MG PO TABS
40.00 | ORAL_TABLET | ORAL | Status: DC
Start: 2019-09-22 — End: 2019-09-19

## 2019-09-19 MED ORDER — GENERIC EXTERNAL MEDICATION
240.00 | Status: DC
Start: ? — End: 2019-09-19

## 2019-09-19 MED ORDER — FLUTICASONE FUROATE-VILANTEROL 100-25 MCG/INH IN AEPB
1.00 | INHALATION_SPRAY | RESPIRATORY_TRACT | Status: DC
Start: 2019-09-23 — End: 2019-09-19

## 2019-09-19 MED ORDER — PANTOPRAZOLE SODIUM 20 MG PO TBEC
20.00 | DELAYED_RELEASE_TABLET | ORAL | Status: DC
Start: 2019-09-23 — End: 2019-09-19

## 2019-09-19 MED ORDER — GENERIC EXTERNAL MEDICATION
Status: DC
Start: ? — End: 2019-09-19

## 2019-09-19 MED ORDER — POLYETHYLENE GLYCOL 3350 17 GM/SCOOP PO POWD
17.00 | ORAL | Status: DC
Start: 2019-09-20 — End: 2019-09-19

## 2019-09-19 MED ORDER — SODIUM CHLORIDE 0.9 % IV SOLN
75.00 | INTRAVENOUS | Status: DC
Start: ? — End: 2019-09-19

## 2019-09-19 MED ORDER — BISACODYL 10 MG RE SUPP
10.00 | RECTAL | Status: DC
Start: ? — End: 2019-09-19

## 2019-09-19 MED ORDER — LABETALOL HCL 5 MG/ML IV SOLN
10.00 | INTRAVENOUS | Status: DC
Start: ? — End: 2019-09-19

## 2019-09-19 MED ORDER — ACETAMINOPHEN 325 MG PO TABS
650.00 | ORAL_TABLET | ORAL | Status: DC
Start: ? — End: 2019-09-19

## 2019-09-19 MED ORDER — HYDRALAZINE HCL 20 MG/ML IJ SOLN
10.00 | INTRAMUSCULAR | Status: DC
Start: ? — End: 2019-09-19

## 2019-09-19 MED ORDER — DEXAMETHASONE 4 MG PO TABS
4.00 | ORAL_TABLET | ORAL | Status: DC
Start: 2019-09-19 — End: 2019-09-19

## 2019-09-20 MED ORDER — GENERIC EXTERNAL MEDICATION
Status: DC
Start: ? — End: 2019-09-20

## 2019-09-20 MED ORDER — CEFAZOLIN SODIUM-DEXTROSE 2-4 GM/100ML-% IV SOLN
2.00 | INTRAVENOUS | Status: DC
Start: 2019-09-20 — End: 2019-09-20

## 2019-09-20 MED ORDER — LEVETIRACETAM IN NACL 500 MG/100ML IV SOLN
500.00 | INTRAVENOUS | Status: DC
Start: 2019-09-20 — End: 2019-09-20

## 2019-09-20 MED ORDER — MORPHINE SULFATE 2 MG/ML IJ SOLN
2.00 | INTRAMUSCULAR | Status: DC
Start: ? — End: 2019-09-20

## 2019-09-20 MED ORDER — DEXAMETHASONE SODIUM PHOSPHATE 4 MG/ML IJ SOLN
4.00 | INTRAMUSCULAR | Status: DC
Start: 2019-09-20 — End: 2019-09-20

## 2019-09-22 DIAGNOSIS — R2681 Unsteadiness on feet: Secondary | ICD-10-CM | POA: Insufficient documentation

## 2019-09-22 DIAGNOSIS — F339 Major depressive disorder, recurrent, unspecified: Secondary | ICD-10-CM | POA: Insufficient documentation

## 2019-09-22 DIAGNOSIS — E785 Hyperlipidemia, unspecified: Secondary | ICD-10-CM | POA: Insufficient documentation

## 2019-09-22 DIAGNOSIS — M1990 Unspecified osteoarthritis, unspecified site: Secondary | ICD-10-CM | POA: Insufficient documentation

## 2019-09-22 DIAGNOSIS — R531 Weakness: Secondary | ICD-10-CM | POA: Insufficient documentation

## 2019-09-22 DIAGNOSIS — J45909 Unspecified asthma, uncomplicated: Secondary | ICD-10-CM | POA: Insufficient documentation

## 2019-09-22 DIAGNOSIS — M109 Gout, unspecified: Secondary | ICD-10-CM | POA: Insufficient documentation

## 2019-09-22 DIAGNOSIS — R262 Difficulty in walking, not elsewhere classified: Secondary | ICD-10-CM | POA: Insufficient documentation

## 2019-09-22 DIAGNOSIS — R4189 Other symptoms and signs involving cognitive functions and awareness: Secondary | ICD-10-CM | POA: Insufficient documentation

## 2019-09-22 DIAGNOSIS — Z741 Need for assistance with personal care: Secondary | ICD-10-CM | POA: Insufficient documentation

## 2019-09-22 DIAGNOSIS — M6281 Muscle weakness (generalized): Secondary | ICD-10-CM | POA: Insufficient documentation

## 2019-09-22 DIAGNOSIS — R278 Other lack of coordination: Secondary | ICD-10-CM | POA: Insufficient documentation

## 2019-09-22 DIAGNOSIS — G4700 Insomnia, unspecified: Secondary | ICD-10-CM | POA: Insufficient documentation

## 2019-09-22 DIAGNOSIS — Z853 Personal history of malignant neoplasm of breast: Secondary | ICD-10-CM | POA: Insufficient documentation

## 2019-09-22 DIAGNOSIS — J449 Chronic obstructive pulmonary disease, unspecified: Secondary | ICD-10-CM | POA: Insufficient documentation

## 2019-09-22 DIAGNOSIS — K219 Gastro-esophageal reflux disease without esophagitis: Secondary | ICD-10-CM | POA: Insufficient documentation

## 2019-09-22 DIAGNOSIS — Z48811 Encounter for surgical aftercare following surgery on the nervous system: Secondary | ICD-10-CM | POA: Insufficient documentation

## 2019-09-22 MED ORDER — SENNOSIDES 8.6 MG PO TABS
2.00 | ORAL_TABLET | ORAL | Status: DC
Start: 2019-09-22 — End: 2019-09-22

## 2019-09-22 MED ORDER — GENERIC EXTERNAL MEDICATION
Status: DC
Start: ? — End: 2019-09-22

## 2019-09-22 MED ORDER — LEVETIRACETAM 500 MG PO TABS
500.00 | ORAL_TABLET | ORAL | Status: DC
Start: 2019-09-22 — End: 2019-09-22

## 2019-09-22 MED ORDER — CALCIUM CARBONATE 1250 (500 CA) MG PO CHEW
CHEWABLE_TABLET | ORAL | Status: DC
Start: ? — End: 2019-09-22

## 2019-09-22 MED ORDER — OXYCODONE HCL 5 MG PO TABS
10.00 | ORAL_TABLET | ORAL | Status: DC
Start: ? — End: 2019-09-22

## 2019-09-22 MED ORDER — ENOXAPARIN SODIUM 40 MG/0.4ML ~~LOC~~ SOLN
40.00 | SUBCUTANEOUS | Status: DC
Start: 2019-09-22 — End: 2019-09-22

## 2019-09-22 MED ORDER — KETOROLAC TROMETHAMINE 15 MG/ML IJ SOLN
15.00 | INTRAMUSCULAR | Status: DC
Start: ? — End: 2019-09-22

## 2019-09-22 MED ORDER — POLYETHYLENE GLYCOL 3350 17 GM/SCOOP PO POWD
17.00 | ORAL | Status: DC
Start: 2019-09-23 — End: 2019-09-22

## 2019-09-22 MED ORDER — ACETAMINOPHEN 500 MG PO TABS
1000.00 | ORAL_TABLET | ORAL | Status: DC
Start: 2019-09-22 — End: 2019-09-22

## 2019-09-26 DIAGNOSIS — M6281 Muscle weakness (generalized): Secondary | ICD-10-CM | POA: Diagnosis not present

## 2019-09-26 DIAGNOSIS — I10 Essential (primary) hypertension: Secondary | ICD-10-CM | POA: Diagnosis not present

## 2019-09-26 DIAGNOSIS — J439 Emphysema, unspecified: Secondary | ICD-10-CM | POA: Diagnosis not present

## 2019-09-26 DIAGNOSIS — F324 Major depressive disorder, single episode, in partial remission: Secondary | ICD-10-CM

## 2019-09-26 DIAGNOSIS — C711 Malignant neoplasm of frontal lobe: Secondary | ICD-10-CM | POA: Diagnosis not present

## 2019-10-01 ENCOUNTER — Inpatient Hospital Stay
Admission: EM | Admit: 2019-10-01 | Discharge: 2019-10-03 | DRG: 871 | Disposition: A | Discharge status: Skilled Nursing Facility | Payer: Medicare HMO | Attending: Student | Admitting: Student

## 2019-10-01 ENCOUNTER — Other Ambulatory Visit: Payer: Self-pay

## 2019-10-01 ENCOUNTER — Emergency Department: Payer: Medicare HMO

## 2019-10-01 DIAGNOSIS — Z170 Estrogen receptor positive status [ER+]: Secondary | ICD-10-CM

## 2019-10-01 DIAGNOSIS — Z8249 Family history of ischemic heart disease and other diseases of the circulatory system: Secondary | ICD-10-CM

## 2019-10-01 DIAGNOSIS — I10 Essential (primary) hypertension: Secondary | ICD-10-CM

## 2019-10-01 DIAGNOSIS — A419 Sepsis, unspecified organism: Secondary | ICD-10-CM | POA: Diagnosis not present

## 2019-10-01 DIAGNOSIS — R0902 Hypoxemia: Secondary | ICD-10-CM | POA: Diagnosis not present

## 2019-10-01 DIAGNOSIS — G936 Cerebral edema: Secondary | ICD-10-CM | POA: Diagnosis present

## 2019-10-01 DIAGNOSIS — R519 Headache, unspecified: Secondary | ICD-10-CM | POA: Diagnosis not present

## 2019-10-01 DIAGNOSIS — Z853 Personal history of malignant neoplasm of breast: Secondary | ICD-10-CM

## 2019-10-01 DIAGNOSIS — R531 Weakness: Secondary | ICD-10-CM | POA: Diagnosis not present

## 2019-10-01 DIAGNOSIS — Z20822 Contact with and (suspected) exposure to covid-19: Secondary | ICD-10-CM | POA: Diagnosis present

## 2019-10-01 DIAGNOSIS — Z923 Personal history of irradiation: Secondary | ICD-10-CM

## 2019-10-01 DIAGNOSIS — Z87891 Personal history of nicotine dependence: Secondary | ICD-10-CM | POA: Diagnosis not present

## 2019-10-01 DIAGNOSIS — A4181 Sepsis due to Enterococcus: Principal | ICD-10-CM | POA: Diagnosis present

## 2019-10-01 DIAGNOSIS — J45909 Unspecified asthma, uncomplicated: Secondary | ICD-10-CM | POA: Diagnosis present

## 2019-10-01 DIAGNOSIS — E876 Hypokalemia: Secondary | ICD-10-CM | POA: Diagnosis not present

## 2019-10-01 DIAGNOSIS — F32A Depression, unspecified: Secondary | ICD-10-CM | POA: Diagnosis present

## 2019-10-01 DIAGNOSIS — R4020 Unspecified coma: Secondary | ICD-10-CM | POA: Diagnosis not present

## 2019-10-01 DIAGNOSIS — T502X5A Adverse effect of carbonic-anhydrase inhibitors, benzothiadiazides and other diuretics, initial encounter: Secondary | ICD-10-CM | POA: Diagnosis present

## 2019-10-01 DIAGNOSIS — M8580 Other specified disorders of bone density and structure, unspecified site: Secondary | ICD-10-CM | POA: Diagnosis present

## 2019-10-01 DIAGNOSIS — R410 Disorientation, unspecified: Secondary | ICD-10-CM

## 2019-10-01 DIAGNOSIS — Z7401 Bed confinement status: Secondary | ICD-10-CM | POA: Diagnosis not present

## 2019-10-01 DIAGNOSIS — M1990 Unspecified osteoarthritis, unspecified site: Secondary | ICD-10-CM | POA: Diagnosis present

## 2019-10-01 DIAGNOSIS — R110 Nausea: Secondary | ICD-10-CM | POA: Diagnosis not present

## 2019-10-01 DIAGNOSIS — N390 Urinary tract infection, site not specified: Secondary | ICD-10-CM | POA: Diagnosis present

## 2019-10-01 DIAGNOSIS — F329 Major depressive disorder, single episode, unspecified: Secondary | ICD-10-CM | POA: Diagnosis present

## 2019-10-01 DIAGNOSIS — Z79899 Other long term (current) drug therapy: Secondary | ICD-10-CM

## 2019-10-01 DIAGNOSIS — M109 Gout, unspecified: Secondary | ICD-10-CM | POA: Diagnosis present

## 2019-10-01 DIAGNOSIS — K219 Gastro-esophageal reflux disease without esophagitis: Secondary | ICD-10-CM | POA: Diagnosis present

## 2019-10-01 DIAGNOSIS — B379 Candidiasis, unspecified: Secondary | ICD-10-CM | POA: Diagnosis present

## 2019-10-01 DIAGNOSIS — I700 Atherosclerosis of aorta: Secondary | ICD-10-CM | POA: Diagnosis present

## 2019-10-01 DIAGNOSIS — M2550 Pain in unspecified joint: Secondary | ICD-10-CM | POA: Diagnosis not present

## 2019-10-01 DIAGNOSIS — E785 Hyperlipidemia, unspecified: Secondary | ICD-10-CM | POA: Diagnosis not present

## 2019-10-01 DIAGNOSIS — Z9889 Other specified postprocedural states: Secondary | ICD-10-CM

## 2019-10-01 DIAGNOSIS — I959 Hypotension, unspecified: Secondary | ICD-10-CM | POA: Diagnosis not present

## 2019-10-01 DIAGNOSIS — Z86011 Personal history of benign neoplasm of the brain: Secondary | ICD-10-CM | POA: Diagnosis not present

## 2019-10-01 DIAGNOSIS — Z806 Family history of leukemia: Secondary | ICD-10-CM | POA: Diagnosis not present

## 2019-10-01 LAB — COMPREHENSIVE METABOLIC PANEL
ALT: 20 U/L (ref 0–44)
AST: 19 U/L (ref 15–41)
Albumin: 3.5 g/dL (ref 3.5–5.0)
Alkaline Phosphatase: 59 U/L (ref 38–126)
Anion gap: 11 (ref 5–15)
BUN: 18 mg/dL (ref 8–23)
CO2: 25 mmol/L (ref 22–32)
Calcium: 8.4 mg/dL — ABNORMAL LOW (ref 8.9–10.3)
Chloride: 103 mmol/L (ref 98–111)
Creatinine, Ser: 1.11 mg/dL — ABNORMAL HIGH (ref 0.44–1.00)
GFR calc Af Amer: 53 mL/min — ABNORMAL LOW (ref >60)
GFR calc non Af Amer: 46 mL/min — ABNORMAL LOW (ref >60)
Glucose, Bld: 109 mg/dL — ABNORMAL HIGH (ref 70–99)
Potassium: 3.4 mmol/L — ABNORMAL LOW (ref 3.5–5.1)
Sodium: 139 mmol/L (ref 135–145)
Total Bilirubin: 1 mg/dL (ref 0.3–1.2)
Total Protein: 6.2 g/dL — ABNORMAL LOW (ref 6.5–8.1)

## 2019-10-01 LAB — PROTIME-INR
INR: 0.9 (ref 0.8–1.2)
Prothrombin Time: 12 seconds (ref 11.4–15.2)

## 2019-10-01 LAB — LACTIC ACID, PLASMA
Lactic Acid, Venous: 2.9 mmol/L (ref 0.5–1.9)
Lactic Acid, Venous: 2.5 mmol/L (ref 0.5–1.9)

## 2019-10-01 LAB — URINALYSIS, COMPLETE (UACMP) WITH MICROSCOPIC
Bacteria, UA: NONE SEEN
Glucose, UA: NEGATIVE mg/dL
Hgb urine dipstick: NEGATIVE
Ketones, ur: 15 mg/dL — AB
Nitrite: NEGATIVE
Protein, ur: 30 mg/dL — AB
Specific Gravity, Urine: 1.025 (ref 1.005–1.030)
WBC, UA: >50 WBC/hpf (ref 0–5)
pH: 5.5 (ref 5.0–8.0)

## 2019-10-01 LAB — CBC WITH DIFFERENTIAL/PLATELET
Abs Immature Granulocytes: 0.07 10*3/uL (ref 0.00–0.07)
Basophils Absolute: 0 10*3/uL (ref 0.0–0.1)
Basophils Relative: 0 %
Eosinophils Absolute: 0.3 10*3/uL (ref 0.0–0.5)
Eosinophils Relative: 2 %
HCT: 32.8 % — ABNORMAL LOW (ref 36.0–46.0)
Hemoglobin: 10.9 g/dL — ABNORMAL LOW (ref 12.0–15.0)
Immature Granulocytes: 0 %
Lymphocytes Relative: 11 %
Lymphs Abs: 1.7 10*3/uL (ref 0.7–4.0)
MCH: 32.3 pg (ref 26.0–34.0)
MCHC: 33.2 g/dL (ref 30.0–36.0)
MCV: 97.3 fL (ref 80.0–100.0)
Monocytes Absolute: 1.1 10*3/uL — ABNORMAL HIGH (ref 0.1–1.0)
Monocytes Relative: 7 %
Neutro Abs: 13 10*3/uL — ABNORMAL HIGH (ref 1.7–7.7)
Neutrophils Relative %: 80 %
Platelets: 291 10*3/uL (ref 150–400)
RBC: 3.37 MIL/uL — ABNORMAL LOW (ref 3.87–5.11)
RDW: 14.5 % (ref 11.5–15.5)
WBC: 16.2 10*3/uL — ABNORMAL HIGH (ref 4.0–10.5)
nRBC: 0 % (ref 0.0–0.2)

## 2019-10-01 LAB — SARS CORONAVIRUS 2 BY RT PCR (HOSPITAL ORDER, PERFORMED IN ~~LOC~~ HOSPITAL LAB): SARS Coronavirus 2: NEGATIVE

## 2019-10-01 LAB — BRAIN NATRIURETIC PEPTIDE: B Natriuretic Peptide: 26.2 pg/mL (ref 0.0–100.0)

## 2019-10-01 LAB — PROCALCITONIN: Procalcitonin: 0.12 ng/mL

## 2019-10-01 LAB — MRSA PCR SCREENING: MRSA by PCR: NEGATIVE

## 2019-10-01 LAB — LIPASE, BLOOD: Lipase: 18 U/L (ref 11–51)

## 2019-10-01 MED ORDER — SERTRALINE HCL 50 MG PO TABS
50.0000 mg | ORAL_TABLET | Freq: Every day | ORAL | Status: DC
  Administered 2019-10-01 – 2019-10-03 (×3): 50 mg via ORAL
  Filled 2019-10-01 (×3): qty 1

## 2019-10-01 MED ORDER — LACTATED RINGERS IV BOLUS (SEPSIS)
600.0000 mL | Freq: Once | INTRAVENOUS | Status: AC
  Administered 2019-10-01: 600 mL via INTRAVENOUS

## 2019-10-01 MED ORDER — SENNA 8.6 MG PO TABS
2.0000 | ORAL_TABLET | Freq: Every day | ORAL | Status: DC
  Administered 2019-10-02: 17.2 mg via ORAL
  Filled 2019-10-01: qty 2

## 2019-10-01 MED ORDER — ONDANSETRON HCL 4 MG/2ML IJ SOLN: 4.0000 mg | Freq: Four times a day (QID) | INTRAMUSCULAR | Status: DC | PRN

## 2019-10-01 MED ORDER — LORATADINE 10 MG PO TABS
10.0000 mg | ORAL_TABLET | Freq: Every day | ORAL | Status: DC
  Administered 2019-10-01 – 2019-10-03 (×3): 10 mg via ORAL
  Filled 2019-10-01 (×3): qty 1

## 2019-10-01 MED ORDER — UMECLIDINIUM BROMIDE 62.5 MCG/INH IN AEPB
1.0000 | INHALATION_SPRAY | Freq: Every day | RESPIRATORY_TRACT | Status: DC
  Administered 2019-10-01 – 2019-10-03 (×3): 1 via RESPIRATORY_TRACT
  Filled 2019-10-01: qty 7

## 2019-10-01 MED ORDER — POTASSIUM CHLORIDE CRYS ER 20 MEQ PO TBCR
40.0000 meq | EXTENDED_RELEASE_TABLET | Freq: Once | ORAL | Status: AC
  Administered 2019-10-01: 40 meq via ORAL
  Filled 2019-10-01: qty 2

## 2019-10-01 MED ORDER — ALLOPURINOL 100 MG PO TABS
100.0000 mg | ORAL_TABLET | Freq: Every day | ORAL | Status: DC
  Administered 2019-10-01 – 2019-10-02 (×2): 100 mg via ORAL
  Filled 2019-10-01 (×4): qty 1

## 2019-10-01 MED ORDER — ONDANSETRON HCL 4 MG/2ML IJ SOLN: 4.0000 mg | INTRAMUSCULAR | Status: AC

## 2019-10-01 MED ORDER — PRAVASTATIN SODIUM 10 MG PO TABS
10.0000 mg | ORAL_TABLET | Freq: Every day | ORAL | Status: DC
  Administered 2019-10-01: 10 mg via ORAL
  Filled 2019-10-01 (×2): qty 1

## 2019-10-01 MED ORDER — FLUTICASONE-UMECLIDIN-VILANT 100-62.5-25 MCG/INH IN AEPB: 1.0000 | INHALATION_SPRAY | Freq: Every day | RESPIRATORY_TRACT | Status: DC

## 2019-10-01 MED ORDER — ENOXAPARIN SODIUM 40 MG/0.4ML ~~LOC~~ SOLN: 40.0000 mg | Freq: Two times a day (BID) | SUBCUTANEOUS | Status: DC

## 2019-10-01 MED ORDER — SODIUM CHLORIDE 0.9 % IV BOLUS (SEPSIS)
500.0000 mL | Freq: Once | INTRAVENOUS | Status: AC
  Administered 2019-10-01: 500 mL via INTRAVENOUS

## 2019-10-01 MED ORDER — SODIUM CHLORIDE 0.9 % IV SOLN
2.0000 g | Freq: Once | INTRAVENOUS | Status: AC
  Administered 2019-10-01: 2 g via INTRAVENOUS
  Filled 2019-10-01: qty 2

## 2019-10-01 MED ORDER — TRAZODONE HCL 50 MG PO TABS
50.0000 mg | ORAL_TABLET | Freq: Every day | ORAL | Status: DC
  Administered 2019-10-01 – 2019-10-02 (×2): 50 mg via ORAL
  Filled 2019-10-01 (×2): qty 1

## 2019-10-01 MED ORDER — CALCIUM CARBONATE ANTACID 500 MG PO CHEW
1.0000 | CHEWABLE_TABLET | Freq: Three times a day (TID) | ORAL | Status: DC | PRN
  Administered 2019-10-02: 200 mg via ORAL
  Filled 2019-10-01: qty 1

## 2019-10-01 MED ORDER — POLYETHYLENE GLYCOL 3350 17 GM/SCOOP PO POWD
17.0000 g | Freq: Every day | ORAL | Status: DC
  Filled 2019-10-01: qty 255

## 2019-10-01 MED ORDER — POLYETHYLENE GLYCOL 3350 17 G PO PACK: 17.0000 g | PACK | Freq: Every day | ORAL | Status: DC

## 2019-10-01 MED ORDER — FLUTICASONE FUROATE-VILANTEROL 100-25 MCG/INH IN AEPB
1.0000 | INHALATION_SPRAY | Freq: Every day | RESPIRATORY_TRACT | Status: DC
  Administered 2019-10-01 – 2019-10-03 (×3): 1 via RESPIRATORY_TRACT
  Filled 2019-10-01: qty 28

## 2019-10-01 MED ORDER — TRAMADOL HCL 50 MG PO TABS
50.0000 mg | ORAL_TABLET | Freq: Once | ORAL | Status: AC
  Administered 2019-10-01: 50 mg via ORAL
  Filled 2019-10-01: qty 1

## 2019-10-01 MED ORDER — SODIUM CHLORIDE 0.9 % IV SOLN: INTRAVENOUS | Status: DC

## 2019-10-01 MED ORDER — SODIUM CHLORIDE 0.9 % IV BOLUS
1000.0000 mL | Freq: Once | INTRAVENOUS | Status: AC
  Administered 2019-10-01: 1000 mL via INTRAVENOUS

## 2019-10-01 MED ORDER — ENOXAPARIN SODIUM 40 MG/0.4ML ~~LOC~~ SOLN
40.0000 mg | SUBCUTANEOUS | Status: DC
  Administered 2019-10-01 – 2019-10-02 (×2): 40 mg via SUBCUTANEOUS
  Filled 2019-10-01 (×2): qty 0.4

## 2019-10-01 MED ORDER — ONDANSETRON HCL 4 MG PO TABS: 4.0000 mg | ORAL_TABLET | Freq: Four times a day (QID) | ORAL | Status: DC | PRN

## 2019-10-01 MED ORDER — BISACODYL 10 MG RE SUPP: 10.0000 mg | Freq: Every day | RECTAL | Status: DC | PRN

## 2019-10-01 MED ORDER — ACETAMINOPHEN 325 MG PO TABS
650.0000 mg | ORAL_TABLET | Freq: Four times a day (QID) | ORAL | Status: DC | PRN
  Administered 2019-10-01: 650 mg via ORAL
  Filled 2019-10-01: qty 2

## 2019-10-01 MED ORDER — ONDANSETRON HCL 4 MG/2ML IJ SOLN
INTRAMUSCULAR | Status: AC
  Administered 2019-10-01: 4 mg via INTRAVENOUS
  Filled 2019-10-01: qty 2

## 2019-10-01 MED ORDER — PANTOPRAZOLE SODIUM 40 MG PO TBEC
40.0000 mg | DELAYED_RELEASE_TABLET | Freq: Every day | ORAL | Status: DC
  Administered 2019-10-01 – 2019-10-03 (×3): 40 mg via ORAL
  Filled 2019-10-01 (×3): qty 1

## 2019-10-01 MED ORDER — SODIUM CHLORIDE 0.9 % IV SOLN
1.0000 g | Freq: Two times a day (BID) | INTRAVENOUS | Status: DC
  Administered 2019-10-01 – 2019-10-02 (×3): 1 g via INTRAVENOUS
  Filled 2019-10-01 (×4): qty 1

## 2019-10-01 MED ORDER — LACTATED RINGERS IV BOLUS (SEPSIS)
1000.0000 mL | Freq: Once | INTRAVENOUS | Status: AC
  Administered 2019-10-01: 1000 mL via INTRAVENOUS

## 2019-10-01 NOTE — Plan of Care (Deleted)
  Problem: Education: °Goal: Knowledge of General Education information will improve °Description: Including pain rating scale, medication(s)/side effects and non-pharmacologic comfort measures °Outcome: Progressing °  °Problem: Clinical Measurements: °Goal: Diagnostic test results will improve °Outcome: Progressing °  °Problem: Pain Managment: °Goal: General experience of comfort will improve °Outcome: Progressing °  °Problem: Safety: °Goal: Ability to remain free from injury will improve °Outcome: Progressing °  °

## 2019-10-01 NOTE — ED Notes (Signed)
Multiple attempts at PIV insertion and blood specimen collection made by April RN, Jerelyn Charles RN. Lab called for blood specimen collection. Lab phlebotomist unsuccessful at blood specimen collection. MD informed.

## 2019-10-01 NOTE — Progress Notes (Signed)
PHARMACY -  BRIEF ANTIBIOTIC NOTE   Pharmacy has received consult(s) for Cefepime from an ED provider.  The patient's profile has been reviewed for ht/wt/allergies/indication/available labs.    One time order(s) placed for Cefepime 2gm  Further antibiotics/pharmacy consults should be ordered by admitting physician if indicated.                       Thank you, Ena Dawley 10/01/2019  6:41 AM

## 2019-10-01 NOTE — Progress Notes (Addendum)
Pt BP at 98/76 MAP 60, temp at 99.8 rest of vitals were fine. Pt is resting at this time with no signs of distress. Notify MD Damita Dunnings. Will continue to monitor.  Update 2018: MD Damita Dunnings ordered 1 liter bolus sodium chloride 0.9 % once and to call MD if MAP stay at 60. Will continue to monitor.  Update 2018: Pt BP at 113/48 MAP at 68 with temp of 98.7 and the rest VS stable. MD Damita Dunnings made aware. Will continue to monitor.  Update 0500: MD Damita Dunnings added Lactic acid check once. Will continue to monitor.

## 2019-10-01 NOTE — Consult Note (Signed)
Pharmacy Antibiotic Note  Kirsten Snyder is a 86 y.o. female admitted on 10/01/2019 with sepsis. Patient recently diagnosed with frontal lobe tumor and is status post craniotomy and tumor resection at [facility]. Source of infection thought to be urine. Patient had Klebsiella pneumoniae growing in urine culture at [facility] on 5/27. Susceptibilities not available. Appears patient was treated with ceftriaxone / cefazolin during that admission. Pharmacy has been consulted for meropenem dosing.  Plan: Meropenem 1 g q12h   Continue to follow renal function and culture data.  Height: 5\' 3"  (160 cm) Weight: 107 kg (235 lb 14.3 oz) IBW/kg (Calculated) : 52.4  Temp (24hrs), Avg:100.3 F (37.9 C), Min:100.3 F (37.9 C), Max:100.3 F (37.9 C)  Recent Labs  Lab 10/01/19 0604 10/01/19 0811  WBC 16.2*  --   CREATININE 1.11*  --   LATICACIDVEN 2.9* 2.5*    Estimated Creatinine Clearance: 44.2 mL/min (A) (by C-G formula based on SCr of 1.11 mg/dL (H)).    Allergies  Allergen Reactions  . Sulfa Antibiotics     Antimicrobials this admission: Patient received cefepime 2 g x 1 in ED on 6/13  Meropenem 6/13 >>   Dose adjustments this admission: n/a  Microbiology results: 6/13 BCx: pending 6/13 UCx: pending   Thank you for allowing pharmacy to be a part of this patient's care.  [place] Resident 10/01/2019 10:37 AM

## 2019-10-01 NOTE — ED Notes (Signed)
Patient transported to CT 

## 2019-10-01 NOTE — ED Provider Notes (Signed)
-----------------------------------------   9:07 AM on 10/01/2019 -----------------------------------------  Blood pressure 101/87, pulse 85, temperature 100.3 F (37.9 C), temperature source Rectal, resp. rate 18, height 5\' 3"  (1.6 m), weight 107 kg, SpO2 95 %.  Assuming care from Dr. Karma Greaser.  In short, Kirsten Snyder is a 86 y.o. female with a chief complaint of Hypotension .  Refer to the original H&P for additional details.  The current plan of care is to discuss further with [facility] for potential transfer.  ----------------------------------------- 10:01 AM on 10/01/2019 -----------------------------------------  Case discussed with Dr. Jacqulyn Ducking of the hospitalist service at [facility].  Unfortunately, there is not currently any beds available for the patient at [facility].  As a result, we will plan for admission here at [facility], which son is agreeable to.  Patient is awake and alert at this time with improving blood pressure following additional fluid bolus.  Case was discussed with hospitalist for admission.    Blake Divine, MD 10/01/19 1002

## 2019-10-01 NOTE — Plan of Care (Signed)
  Problem: Clinical Measurements: Goal: Will remain free from infection Outcome: Progressing   Problem: Safety: Goal: Ability to remain free from injury will improve 10/01/2019 2254 by Liliane Channel, RN Outcome: Progressing 10/01/2019 2241 by Liliane Channel, RN Outcome: Progressing

## 2019-10-01 NOTE — Progress Notes (Deleted)
PHARMACIST - PHYSICIAN COMMUNICATION  CONCERNING:  Enoxaparin (Lovenox) for DVT Prophylaxis    RECOMMENDATION: Patient was prescribed enoxaparin 40mg  q24 hours for VTE prophylaxis.   Filed Weights   10/01/19 0412  Weight: 107 kg (235 lb 14.3 oz)    Body mass index is 41.79 kg/m.  Estimated Creatinine Clearance: 44.2 mL/min (A) (by C-G formula based on SCr of 1.11 mg/dL (H)).   Based on [place] patient is candidate for enoxaparin 40mg  every 12 hour dosing due to BMI being >40.   DESCRIPTION: Pharmacy has adjusted enoxaparin dose per [facility] policy.  Patient is now receiving enoxaparin 40mg  every 12 hours.   [place] Resident 10/01/2019 12:08 PM

## 2019-10-01 NOTE — ED Triage Notes (Signed)
Patient coming [facility] from [facility] for unresponsiveness. Patient awake and alert at EMS arrival. Patient hypotensive for EMS with BP of 84/37. Patient had brain surgery 2 weeks ago at [facility].

## 2019-10-01 NOTE — ED Provider Notes (Signed)
[facility] Emergency Department Provider Note  ____________________________________________   First MD Initiated Contact with Patient 10/01/19 [phone]     (approximate)  I have reviewed the triage vital signs and the nursing notes.   HISTORY  Chief Complaint Hypotension  Level 5 caveat:  history/ROS limited by acute/critical illness or acute delirium versus chronic mental status changes.  HPI Kirsten Snyder is a 86 y.o. female whose medical history is most notable for recent diagnosis of frontal lobe tumor status post craniotomy and tumor resection at [facility] within the last couple of weeks.  She presents by EMS tonight for evaluation of decreased level of consciousness and hypotension.  She has had a skilled nursing facility in [place] and EMS was called for unresponsiveness.  The patient initially seemed unresponsive in a chair but was awake and when they began transferring her to a bed.  She is a little bit confused about the situation but otherwise she has no specific complaints, just states she does not feel well in general.  She apparently had an episode of diarrhea when she was unresponsive.  No witnessed seizure activity was reported but history was limited.  The patient is alert to herself but is confused about the situation.  The patient complains of no weakness, chest pain, shortness of breath, abdominal pain, or nausea and vomiting.  She just says that she does not feel well in general.  She was not sent with the medication list but she was sent with documentation that indicates she is full code.  EMS reports that they were in the process of transporting her to [facility] but were concerned about her blood pressure which reportedly was initially in the 14H systolic but then was in the 70Y systolic so they diverted to [facility].  Subsequently one of the paramedics spoke with the patient's son who reports that her blood pressure has been in the 90s at the facility  for the last few days.         Past Medical History:  Diagnosis Date  . Allergy   . Asthma   . Breast cancer ([place])   . Cancer [facility]) 2012   right breast cancer lumpectomy with rad tx  . Depression   . Edema   . GERD (gastroesophageal reflux disease)   . Gout   . Hyperlipidemia   . Hypertension   . Personal history of radiation therapy     Patient Active Problem List   Diagnosis Date Noted  . Atherosclerosis of abdominal aorta ([place]) 10/12/2018  . SOBOE (shortness of breath on exertion) 10/12/2018  . Osteopenia of neck of right femur 06/29/2018  . Breast cancer, right ([place]) 06/17/2016  . Essential hypertension 12/13/2015  . Breast abscess of female 11/07/2015  . Familial multiple lipoprotein-type hyperlipidemia 10/25/2014  . Clinical depression 10/25/2014  . Acid reflux 10/25/2014  . Acute gastrointestinal bleeding 10/25/2014  . Aggrieved 10/25/2014  . Airway hyperreactivity 10/25/2014  . Arthritis of knee 10/25/2014  . H/O: gout 10/25/2014  . Adiposity 10/25/2014  . Arthritis, degenerative 10/25/2014  . H/O adenomatous polyp of colon 03/07/2014    Past Surgical History:  Procedure Laterality Date  . BREAST BIOPSY Bilateral    negative  . BREAST EXCISIONAL BIOPSY Right    right positive 04/2010  . BREAST LUMPECTOMY Right    removed a cancerous lump  . VAGINAL HYSTERECTOMY      Prior to Admission medications   Medication Sig Start Date End Date Taking? Authorizing Provider  albuterol (VENTOLIN HFA) 108 (90 Base) MCG/ACT inhaler Inhale 2 puffs into the lungs every 6 (six) hours as needed for wheezing or shortness of breath. 04/11/19  Yes [name], MD  allopurinol (ZYLOPRIM) 100 MG tablet 1 tablet by mouth daily Patient taking differently: Take 100 mg by mouth at bedtime. For gout 04/11/19  Yes [name], MD  bisacodyl (DULCOLAX) 10 MG suppository Place 10 mg rectally daily as needed. For no bowel movement in 2 days or more. 09/22/19 10/22/19 Yes [provider]  calcium carbonate (TUMS - DOSED IN MG ELEMENTAL CALCIUM) 500 MG chewable tablet Chew 1 tablet by mouth every 8 (eight) hours as needed for indigestion.   Yes [provider]  cetirizine (ZYRTEC) 10 MG tablet Take 10 mg by mouth daily. For allergies.   Yes [provider]  fluconazole (DIFLUCAN) 150 MG tablet Take 150 mg by mouth daily. For thrush. 09/28/19 10/01/19 Yes [provider]  Fluticasone-Umeclidin-Vilant (TRELEGY ELLIPTA) 100-62.5-25 MCG/INH AEPB Inhale 1 puff into the lungs daily. 04/11/19  Yes [name], MD  furosemide (LASIX) 20 MG tablet TAKE (1) TABLET BY MOUTH EVERY DAY 04/11/19  Yes [name], MD  losartan (COZAAR) 100 MG tablet One tablet daily Patient taking differently: Take 50 mg by mouth daily. Hold if SBP is less than or equal to 90. 04/11/19  Yes [name], MD  lovastatin (MEVACOR) 40 MG tablet TAKE (1) TABLET BY MOUTH DAILY AT BEDTIME 09/04/19  Yes [name], MD  omeprazole (PRILOSEC) 20 MG capsule TAKE ONE (1) CAPSULE EACH DAY. 04/11/19  Yes [name], MD  polyethylene glycol powder (GLYCOLAX/MIRALAX) 17 GM/SCOOP powder Take 17 g by mouth daily. 09/22/19 10/22/19 Yes [provider]  senna (SENOKOT) 8.6 MG tablet Take 2 tablets by mouth at bedtime. for constipation. 09/22/19 10/22/19 Yes [provider]  sertraline (ZOLOFT) 50 MG tablet TAKE (1) TABLET BY MOUTH EVERY DAY 08/15/19  Yes [name], MD  traZODone (DESYREL) 50 MG tablet TAKE (1) TABLET BY MOUTH DAILY AT BEDTIME 04/11/19  Yes [name], MD    Allergies Sulfa antibiotics  Family History  Problem Relation Age of Onset  . Leukemia Mother   . Heart disease Father   . Breast cancer Neg Hx     Social History Social History   Tobacco Use  . Smoking status: Former Smoker    Packs/day: 0.25    Years: 2.00    Pack years: 0.50    Types: Cigarettes  . Smokeless tobacco: Never Used  . Tobacco comment: smoking  cessation materials not required  Vaping Use  . Vaping Use: Never used  Substance Use Topics  . Alcohol use: No    Alcohol/week: 0.0 standard drinks  . Drug use: No    Review of Systems Level 5 caveat:  history/ROS limited by acute/critical illness or acute delirium versus chronic mental status changes.    ____________________________________________   PHYSICAL EXAM:  VITAL SIGNS: ED Triage Vitals  Enc Vitals Group     BP 10/01/19 0412 (!) 88/48     Pulse Rate 10/01/19 0414 82     Resp 10/01/19 0414 16     Temp --      Temp src --      SpO2 10/01/19 0414 97 %     Weight 10/01/19 0412 107 kg (235 lb 14.3 oz)     Height 10/01/19 0412 1.6 m (5\' 3" )     Head  Circumference --      Peak Flow --      Pain Score --      Pain Loc --      Pain Edu? --      Excl. in [place]? --     Constitutional: Alert and oriented but confused as to the situation. Eyes: Conjunctivae are normal.  Head: Well-appearing surgical scar status post craniotomy with staples still in place. Nose: No congestion/rhinnorhea. Mouth/Throat: Patient is wearing a mask. Neck: No stridor.  No meningeal signs.   Cardiovascular: Normal rate, regular rhythm. Good peripheral circulation. Grossly normal heart sounds. Respiratory: Normal respiratory effort.  No retractions. Gastrointestinal: Obese.  Soft and nontender. No distention.  Musculoskeletal: No lower extremity tenderness nor edema. No gross deformities of extremities. Neurologic:  Normal speech and language. No gross focal neurologic deficits are appreciated.  GCS 14.  I do not appreciate any gross focal neurological deficits. Skin:  Skin is warm, dry and intact.   ____________________________________________   LABS (all labs ordered are listed, but only abnormal results are displayed)  Labs Reviewed  LACTIC ACID, PLASMA - Abnormal; Notable for the following components:      Result Value   Lactic Acid, Venous 2.9 (*)    All other components within  normal limits  COMPREHENSIVE METABOLIC PANEL - Abnormal; Notable for the following components:   Potassium 3.4 (*)    Glucose, Bld 109 (*)    Creatinine, Ser 1.11 (*)    Calcium 8.4 (*)    Total Protein 6.2 (*)    GFR calc non Af Amer 46 (*)    GFR calc Af Amer 53 (*)    All other components within normal limits  CBC WITH DIFFERENTIAL/PLATELET - Abnormal; Notable for the following components:   WBC 16.2 (*)    RBC 3.37 (*)    Hemoglobin 10.9 (*)    HCT 32.8 (*)    Neutro Abs 13.0 (*)    Monocytes Absolute 1.1 (*)    All other components within normal limits  URINALYSIS, COMPLETE (UACMP) WITH MICROSCOPIC - Abnormal; Notable for the following components:   Bilirubin Urine SMALL (*)    Ketones, ur 15 (*)    Protein, ur 30 (*)    Leukocytes,Ua SMALL (*)    All other components within normal limits  SARS CORONAVIRUS 2 BY RT PCR (HOSPITAL ORDER, [place] [facility])  CULTURE, BLOOD (ROUTINE X 2)  CULTURE, BLOOD (ROUTINE X 2)  URINE CULTURE  LIPASE, BLOOD  BRAIN NATRIURETIC PEPTIDE  PROCALCITONIN  PROTIME-INR  LACTIC ACID, PLASMA   ____________________________________________  EKG  ED ECG REPORT I, Hinda Kehr, the attending physician, personally viewed and interpreted this ECG.  Date: 10/01/2019 EKG Time: 4:16 AM Rate: 82 Rhythm: normal sinus rhythm QRS Axis: normal Intervals: normal ST/T Wave abnormalities: normal Narrative Interpretation: no evidence of acute ischemia  ____________________________________________  RADIOLOGY I, Hinda Kehr, personally viewed and evaluated these images (plain radiographs) as part of my medical decision making, as well as reviewing the written report by the radiologist.  ED MD interpretation: No acute abnormalities identified on head CT nor chest x-ray  Official radiology report(s): CT Head Wo Contrast  Result Date: 10/01/2019 CLINICAL DATA:  Frontal mass resection EXAM: CT HEAD WITHOUT CONTRAST TECHNIQUE:  Contiguous axial images were obtained from the base of the skull through the vertex without intravenous contrast. COMPARISON:  None. FINDINGS: Brain: Encephalomalacia and white matter hypoattenuation within the right greater than left frontal lobes. No acute  intracranial hemorrhage. No midline shift or other mass effect. Generalized atrophy. Vascular: No hyperdense vessel or unexpected calcification. Skull: Recent right frontal craniotomy. Sinuses/Orbits: No acute finding. Other: None. IMPRESSION: Encephalomalacia and white matter hypoattenuation within the right greater than left frontal lobes, status post recent right frontal craniotomy. No acute findings. Electronically Signed   By: Ulyses Jarred M.D.   On: 10/01/2019 05:37   DG Chest Port 1 View  Result Date: 10/01/2019 CLINICAL DATA:  Sepsis EXAM: PORTABLE CHEST 1 VIEW COMPARISON:  08/19/2018 FINDINGS: The heart size and mediastinal contours are within normal limits. Both lungs are clear. The visualized skeletal structures are unremarkable. IMPRESSION: No active disease. Electronically Signed   By: Ulyses Jarred M.D.   On: 10/01/2019 05:53    ____________________________________________   PROCEDURES   Procedure(s) performed (including Critical Care):  .Critical Care Performed by: Hinda Kehr, MD Authorized by: Hinda Kehr, MD   Critical care provider statement:    Critical care time (minutes):  30   Critical care time was exclusive of:  Separately billable procedures and treating other patients   Critical care was time spent personally by me on the following activities:  Development of treatment plan with patient or surrogate, discussions with consultants, evaluation of patient's response to treatment, examination of patient, obtaining history from patient or surrogate, ordering and performing treatments and interventions, ordering and review of laboratory studies, ordering and review of radiographic studies, pulse oximetry,  re-evaluation of patient's condition and review of old charts .1-3 Lead EKG Interpretation Performed by: Hinda Kehr, MD Authorized by: Hinda Kehr, MD     Interpretation: normal     ECG rate:  80   ECG rate assessment: normal     Rhythm: sinus rhythm     Ectopy: none     Conduction: normal       ____________________________________________   INITIAL IMPRESSION / MDM / ASSESSMENT AND PLAN / ED COURSE  As part of my medical decision making, I reviewed the following data within the [place] History obtained from family, Nursing notes reviewed and incorporated, Labs reviewed , EKG interpreted , Old chart reviewed, Radiograph reviewed , Discussed with [facility] Neurosurgery (Dr. Camila Li) and reviewed Notes from prior ED visits   Differential diagnosis includes, but is not limited to, sepsis, bacteremia, intracranial infection, worsening vasogenic edema, intracranial hemorrhage, metabolic or electrolyte abnormality, seizure.  The patient is on the cardiac monitor to evaluate for evidence of arrhythmia and/or significant heart rate changes.  The patient's baseline is unclear to me.  I ordered essentially a septic work-up without calling code sepsis at this point that she does not meet criteria.  She is hypertensive but apparently she has been relatively hypotensive for the last few days.  She has no tachycardia and no hypoxemia.  She is alert and oriented except to situation so arguably she has a GCS of 14 for some confusion but I do not know whether this is baseline or not.  In addition to the septic work-up (without calling code sepsis) I ordered a CT of the head without contrast to look for evidence of vasogenic edema.  The comparison will be difficult since we can only see the report in care everywhere and not the actual images, but I reviewed the medical record from the neurosurgeons at [facility] regarding her care and treatments and also reviewed the imaging reports for her  recent MRI of the brain and her CT scan, both of which showed substantial vasogenic edema postoperatively.  I read that she was on a tapering course of Decadron but should have finished it within the last 48 hours.     Clinical Course as of Oct 01 798  Sun Oct 01, 2019  0510 Technically speaking the patient's rectal temperature of 100.3 does not meet SIRS criteria but it is close enough that I am increasingly concerned about the possibility of an infection.  She is getting 500 mL of normal saline by her IV in her hand, but she is proven to be a very difficult IV access patient and blood work has not yet been able to be drawn.  She remains awake and alert.  Nurses are trying with ultrasound guidance and the lab team has also been by to try to draw blood.  Temp: 100.3 F (37.9 C) [CF]  0558 No acute abnormality identified on head CT including no vasogenic edema, midline shift, mass-effect, etc.  CT Head Wo Contrast [CF]  0558 No acute abnormality identified on chest x-ray  DG Chest Port 1 View [CF]  0612 SARS Coronavirus 2: NEGATIVE [CF]  0631 WBC(!): 16.2 [CF]  0631 Urinalysis appears positive with >50 WBCs and ketonuria.  Given her low BP, leukocytosis of 16.2, and positive urine, I will treat empirically for UTI and make her code sepsis.  Urinalysis, Complete w Microscopic(!) [CF]  [id number] Ordering cefepime 2 g IV given her recent hospitalization and the strong possibility she has recently had IV antibiotics.   [CF]  [phone] The patient's son is now at bedside.  We had an extended conversation about her recent surgery and current illness.  I explained that I believe she is suffering from a urinary tract infection which is the cause of her mild fever and leukocytosis although we also talked about the possibility that this could be complication from surgery.  The patient and her son would feel most comfortable, given her recent neurosurgery and care at [facility], if we are able to transfer her to [facility] for  continued care because I do believe she warrants admission for her sepsis, symptoms consistent with delirium, and apparent urinary tract infection.  I think this is very reasonable given that we have no neurosurgical team here at [facility] and she has recently postop.  I am contacting the [facility] logistics center to discuss the case to see if they would be willing to allow me to speak with the ED physician to arrange ED to ED transport where they can then consult other services such as neurosurgery as needed.   [CF]  [phone] Further supports sepsis diagnosis  Lactic Acid, Venous(!!): 2.9 [CF]  0644 Ordering *weight-adjusted* IV fluids per sepsis protocol.  She received 500 mL NS already, and I am ordering another 1.6L LR, which will bring her greater than 24mL/kg for her ideal body weight.   [CF]  0645 I updated the patient and her son that we are waiting to hear back from [facility] about possible transfer to the medical service.  I also explained that if no bed is available it may be necessary and appropriate to admit the patient to [facility] for treatment of her UTI/sepsis with plans to transfer to [facility] if a bed becomes available and she still requires hospitalization.  The patient and the son understand.   [CF]  0727 I discussed the case by phone with Dr. Angelita Ingles with [facility] neurosurgery.  We discussed the case in detail and she agreed that this does not sound like an acute neurosurgical issue but rather a  case of sepsis secondary to a urinary tract infection.  However she agreed that the patient would be best served at [facility] and they would be able to consult if there were any concerns or any acute decline.  It is unclear if any medicine beds are available at this time.  The [facility] logistics center will call back with a medicine service representative.   [CF]  [phone] Transferring ED care to Dr. Charna Archer to follow up with the [place] center.   [CF]    Clinical Course User Index [CF] Hinda Kehr, MD      ____________________________________________  FINAL CLINICAL IMPRESSION(S) / ED DIAGNOSES  Final diagnoses:  Sepsis, due to unspecified organism, unspecified whether acute organ dysfunction present [facility])  Urinary tract infection without hematuria, site unspecified  Delirium  S/P craniotomy     MEDICATIONS GIVEN DURING THIS VISIT:  Medications  lactated ringers bolus 1,000 mL (1,000 mLs Intravenous New Bag/Given 10/01/19 0653)    And  lactated ringers bolus 600 mL (has no administration in time range)  sodium chloride 0.9 % bolus 500 mL (0 mLs Intravenous Stopped 10/01/19 0538)  ondansetron (ZOFRAN) injection 4 mg (4 mg Intravenous Given 10/01/19 0601)  ceFEPIme (MAXIPIME) 2 g in sodium chloride 0.9 % 100 mL IVPB (2 g Intravenous New Bag/Given 10/01/19 4132)     ED Discharge Orders    None      *Please note:  Kirsten Snyder was evaluated in Emergency Department on 10/01/2019 for the symptoms described in the history of present illness. She was evaluated in the context of the global COVID-19 pandemic, which necessitated consideration that the patient might be at risk for infection with the SARS-CoV-2 virus that causes COVID-19. Institutional protocols and algorithms that pertain to the evaluation of patients at risk for COVID-19 are in a state of rapid change based on information released by regulatory bodies including the CDC and federal and state organizations. These policies and algorithms were followed during the patient's care in the ED.  Some ED evaluations and interventions may be delayed as a result of limited staffing during the pandemic.*  Note:  This document was prepared using Dragon voice recognition software and may include unintentional dictation errors.   Hinda Kehr, MD 10/01/19 0800

## 2019-10-01 NOTE — H&P (Addendum)
History and Physical    Kirsten Snyder UXL:[id number] DOB: 07-15-1933 DOA: 10/01/2019  PCP: [name], MD   Patient coming from: Skilled nursing facility  I have personally briefly reviewed patient's old medical records in [place]  Chief Complaint: Unresponsiveness   HPI: Kirsten Snyder is a 86 y.o. female with medical history significant for breast cancer ER/PR positive HER2 negative status post excision and sentinel node biopsy and has completed 5 years of anastrozole without recurrence, hypertension, dyslipidemia, asthma, status post right frontal craniectomy with resection of a brain mass thought to be a meningioma (Final pathology is still pending)  on 06/01 who was brought into the emergency room from the skilled nursing facility where she was discharged to following her recent hospitalization at [facility] for evaluation of decreased responsiveness and hypotension.  EMS was called in because patient was said to be unresponsive but as soon as she was transferred from the chair to the bed the patient became more awake but was confused about the situation.   Patient was said to have had a diarrheal stool while she was unresponsive but no witnessed seizure activity was reported. Per EMS they were in the process of transporting her to [facility] but with concern about her blood pressure which was initially in the 53'G/64Q systolic and so she was brought to [facility]. Patient denies having any weakness, no chest pain, no shortness of breath, no abdominal pain, she had nausea but no vomiting but stated that she did not feel well in general. Labs revealed a potassium level of 3.4, elevated lactate at 2.9, white cell count of 16.2 with predominant neutrophils.  UA showed  pyuria even though patient was treated for UTI during her hospitalization at [facility]. CT scan of the head without contrast showed Encephalomalacia and white matter   hypoattenuation within the right  greater than left frontal lobes, status post recent right frontal craniotomy. No acute findings. Patient son initially wanted her transferred to [facility] since she was just discharged from the but they have no beds available so patient will be admitted here for presumed sepsis from a urinary source.    ED Course: Elderly female who is status post recent resection of a brain tumor who was sent for evaluation from the skilled nursing facility where she currently resides for an episode of unresponsiveness.  Patient was hypotensive in the field with systolic blood pressure in the 80s and had a low-grade temp with a T-max of 100.49F, she also has pyuria.  Code sepsis was called and patient received a dose of cefepime as well as IV fluids.  Her blood pressure has improved.  Family initially wanted her transfer to [facility] but he had no beds available.  Patient will be admitted here.  Review of Systems: As per HPI otherwise 10 point review of systems negative.    Past Medical History:  Diagnosis Date  . Allergy   . Asthma   . Breast cancer ([place])   . Cancer [facility]) 2012   right breast cancer lumpectomy with rad tx  . Depression   . Edema   . GERD (gastroesophageal reflux disease)   . Gout   . Hyperlipidemia   . Hypertension   . Personal history of radiation therapy     Past Surgical History:  Procedure Laterality Date  . BREAST BIOPSY Bilateral    negative  . BREAST EXCISIONAL BIOPSY Right    right positive 04/2010  . BREAST LUMPECTOMY  Right    removed a cancerous lump  . VAGINAL HYSTERECTOMY       reports that she has quit smoking. Her smoking use included cigarettes. She has a 0.50 pack-year smoking history. She has never used smokeless tobacco. She reports that she does not drink alcohol and does not use drugs.  Allergies  Allergen Reactions  . Sulfa Antibiotics     Family History  Problem Relation Age of Onset  . Leukemia Mother   . Heart disease Father   . Breast cancer  Neg Hx      Prior to Admission medications   Medication Sig Start Date End Date Taking? Authorizing Provider  albuterol (VENTOLIN HFA) 108 (90 Base) MCG/ACT inhaler Inhale 2 puffs into the lungs every 6 (six) hours as needed for wheezing or shortness of breath. 04/11/19  Yes [name], MD  allopurinol (ZYLOPRIM) 100 MG tablet 1 tablet by mouth daily Patient taking differently: Take 100 mg by mouth at bedtime. For gout 04/11/19  Yes [name], MD  bisacodyl (DULCOLAX) 10 MG suppository Place 10 mg rectally daily as needed. For no bowel movement in 2 days or more. 09/22/19 10/22/19 Yes [provider]  calcium carbonate (TUMS - DOSED IN MG ELEMENTAL CALCIUM) 500 MG chewable tablet Chew 1 tablet by mouth every 8 (eight) hours as needed for indigestion.   Yes [provider]  cetirizine (ZYRTEC) 10 MG tablet Take 10 mg by mouth daily. For allergies.   Yes [provider]  fluconazole (DIFLUCAN) 150 MG tablet Take 150 mg by mouth daily. For thrush. 09/28/19 10/01/19 Yes [provider]  Fluticasone-Umeclidin-Vilant (TRELEGY ELLIPTA) 100-62.5-25 MCG/INH AEPB Inhale 1 puff into the lungs daily. 04/11/19  Yes [name], MD  furosemide (LASIX) 20 MG tablet TAKE (1) TABLET BY MOUTH EVERY DAY 04/11/19  Yes [name], MD  losartan (COZAAR) 100 MG tablet One tablet daily Patient taking differently: Take 50 mg by mouth daily. Hold if SBP is less than or equal to 90. 04/11/19  Yes [name], MD  lovastatin (MEVACOR) 40 MG tablet TAKE (1) TABLET BY MOUTH DAILY AT BEDTIME 09/04/19  Yes [name], MD  omeprazole (PRILOSEC) 20 MG capsule TAKE ONE (1) CAPSULE EACH DAY. 04/11/19  Yes [name], MD  polyethylene glycol powder (GLYCOLAX/MIRALAX) 17 GM/SCOOP powder Take 17 g by mouth daily. 09/22/19 10/22/19 Yes [provider]  senna (SENOKOT) 8.6 MG tablet Take 2 tablets by mouth at bedtime. for constipation. 09/22/19 10/22/19 Yes [provider]  sertraline (ZOLOFT) 50 MG tablet TAKE (1) TABLET BY MOUTH EVERY DAY 08/15/19  Yes [name], MD  traZODone (DESYREL) 50 MG tablet TAKE (1) TABLET BY MOUTH DAILY AT BEDTIME 04/11/19  Yes [name], MD    Physical Exam: Vitals:   10/01/19 0730 10/01/19 0800 10/01/19 0843 10/01/19 1000  BP: (!) 96/39 (!) 92/44 101/87 (!) 91/45  Pulse: 84 85 85 81  Resp: _0 Temp:      TempSrc:      SpO2: 97% 98% 95% 95%  Weight:      Height:         Vitals:   10/01/19 0730 10/01/19 0800 10/01/19 0843 10/01/19 1000  BP: (!) 96/39 (!) 92/44 101/87 (!) 91/45  Pulse: 84 85 85 81  Resp: _1 Temp:      TempSrc:      SpO2: 97% 98% 95% 95%  Weight:      Height:        Constitutional: NAD, alert and oriented x 3.  Patient has staples over her forehead from recent surgery Eyes: PERRL, lids and conjunctivae pale ENMT: Mucous membranes are moist.  Neck: normal, supple, no masses, no thyromegaly Respiratory: clear to auscultation bilaterally, no wheezing, no crackles. Normal respiratory effort. No accessory muscle use.  Cardiovascular: Regular rate and rhythm, no murmurs / rubs / gallops. No extremity edema. 2+ pedal pulses. No carotid bruits.  Abdomen: no tenderness, no masses palpated. No hepatosplenomegaly. Bowel sounds positive.  Musculoskeletal: no clubbing / cyanosis. No joint deformity upper and lower extremities.  Skin: no rashes, lesions, ulcers.  Neurologic: No gross focal neurologic deficit. Weakness Psychiatric: Normal mood and affect.   Labs on Admission: I have personally reviewed following labs and imaging studies  CBC: Recent Labs  Lab 10/01/19 0604  WBC 16.2*  NEUTROABS 13.0*  HGB 10.9*  HCT 32.8*  MCV 97.3  PLT 630   Basic Metabolic Panel: Recent Labs  Lab 10/01/19 0604  NA 139  K 3.4*  CL 103  CO2 25  GLUCOSE 109*  BUN 18  CREATININE 1.11*  CALCIUM 8.4*   GFR: Estimated Creatinine Clearance: 44.2 mL/min (A) (by C-G  formula based on SCr of 1.11 mg/dL (H)). Liver Function Tests: Recent Labs  Lab 10/01/19 0604  AST 19  ALT 20  ALKPHOS 59  BILITOT 1.0  PROT 6.2*  ALBUMIN 3.5   Recent Labs  Lab 10/01/19 0604  LIPASE 18   No results for input(s): AMMONIA in the last 168 hours. Coagulation Profile: Recent Labs  Lab 10/01/19 0604  INR 0.9   Cardiac Enzymes: No results for input(s): CKTOTAL, CKMB, CKMBINDEX, TROPONINI in the last 168 hours. BNP (last 3 results) No results for input(s): PROBNP in the last 8760 hours. HbA1C: No results for input(s): HGBA1C in the last 72 hours. CBG: No results for input(s): GLUCAP in the last 168 hours. Lipid Profile: No results for input(s): CHOL, HDL, LDLCALC, TRIG, CHOLHDL, LDLDIRECT in the last 72 hours. Thyroid Function Tests: No results for input(s): TSH, T4TOTAL, FREET4, T3FREE, THYROIDAB in the last 72 hours. Anemia Panel: No results for input(s): VITAMINB12, FOLATE, FERRITIN, TIBC, IRON, RETICCTPCT in the last 72 hours. Urine analysis:    Component Value Date/Time   COLORURINE YELLOW 10/01/2019 [place] 10/01/2019 0449   LABSPEC 1.025 10/01/2019 0449   PHURINE 5.5 10/01/2019 0449   GLUCOSEU NEGATIVE 10/01/2019 0449   HGBUR NEGATIVE 10/01/2019 0449   BILIRUBINUR SMALL (A) 10/01/2019 0449   KETONESUR 15 (A) 10/01/2019 0449   PROTEINUR 30 (A) 10/01/2019 0449   NITRITE NEGATIVE 10/01/2019 0449   LEUKOCYTESUR SMALL (A) 10/01/2019 0449    Radiological Exams on Admission: CT Head Wo Contrast  Result Date: 10/01/2019 CLINICAL DATA:  Frontal mass resection EXAM: CT HEAD WITHOUT CONTRAST TECHNIQUE: Contiguous axial images were obtained from the base of the skull through the vertex without intravenous contrast. COMPARISON:  None. FINDINGS: Brain: Encephalomalacia and white matter hypoattenuation within the right greater than left frontal lobes. No acute intracranial hemorrhage. No midline shift or other mass effect. Generalized  atrophy. Vascular: No hyperdense vessel or unexpected calcification. Skull: Recent right frontal craniotomy. Sinuses/Orbits: No acute finding. Other: None. IMPRESSION: Encephalomalacia and white matter hypoattenuation within the right greater than left frontal lobes, status post recent right frontal craniotomy. No acute findings. Electronically Signed   By: Ulyses Jarred M.D.   On: 10/01/2019 05:37  DG Chest Port 1 View  Result Date: 10/01/2019 CLINICAL DATA:  Sepsis EXAM: PORTABLE CHEST 1 VIEW COMPARISON:  08/19/2018 FINDINGS: The heart size and mediastinal contours are within normal limits. Both lungs are clear. The visualized skeletal structures are unremarkable. IMPRESSION: No active disease. Electronically Signed   By: Ulyses Jarred M.D.   On: 10/01/2019 05:53    EKG: Independently reviewed.  Sinus rhythm  Assessment/Plan Principal Problem:   Sepsis secondary to UTI [facility]) Active Problems:   Clinical depression   Sepsis ([place])   Hypokalemia     Sepsis secondary to UTI Patient presents to the emergency room for evaluation of decreased level of responsiveness and was found to be hypotensive with systolic blood pressure in the 80s She had a low-grade temp with a T-max of 100.52F, leukocytosis with a white cell count of 16,000 and elevated lactic acid level Urine culture from her last hospitalization at [facility] yielded Klebsiella pneumonia We will treat patient with IV meropenem until culture results become available, since patient received cephalosporins while at [facility] Continue IV fluid hydration   Hypertension Hold losartan and furosemide since patient came in hypotensive Family is concerned that her blood pressure has been on the low side since she went to rehab Will review medications prior to discharge to determine if patient still needs to be on losartan and furosemide   Hypokalemia Related to diuretic therapy/GI loss Supplement potassium   GERD  Continue oral  PPI   Depression Continue sertraline and trazodone    Status post recent resection of meningioma Keep scheduled follow-up with neurosurgery as an outpatient   History of diarrhea in a patient who recently completed antibiotic therapy We will send stool for C. difficile toxin   DVT prophylaxis: Lovenox Code Status: Full Family Communication: Discussed plan of care with patient's daughter Ms Charlyne Quale over the phone.  All questions and concerns have been addressed.  She verbalizes understanding and agrees with the plan. Disposition Plan: Back to previous home environment Consults called: None    Nussen Pullin MD [facility]     10/01/2019, 10:25 AM

## 2019-10-02 ENCOUNTER — Encounter: Payer: Self-pay | Admitting: Internal Medicine

## 2019-10-02 LAB — CBC
HCT: 27.3 % — ABNORMAL LOW (ref 36.0–46.0)
Hemoglobin: 9.2 g/dL — ABNORMAL LOW (ref 12.0–15.0)
MCH: 33 pg (ref 26.0–34.0)
MCHC: 33.7 g/dL (ref 30.0–36.0)
MCV: 97.8 fL (ref 80.0–100.0)
Platelets: 209 10*3/uL (ref 150–400)
RBC: 2.79 MIL/uL — ABNORMAL LOW (ref 3.87–5.11)
RDW: 14.2 % (ref 11.5–15.5)
WBC: 5.5 10*3/uL (ref 4.0–10.5)
nRBC: 0 % (ref 0.0–0.2)

## 2019-10-02 LAB — PROCALCITONIN: Procalcitonin: <0.1 ng/mL

## 2019-10-02 LAB — LACTIC ACID, PLASMA: Lactic Acid, Venous: 1.1 mmol/L (ref 0.5–1.9)

## 2019-10-02 LAB — PROTIME-INR
INR: 1 (ref 0.8–1.2)
Prothrombin Time: 12.7 seconds (ref 11.4–15.2)

## 2019-10-02 LAB — BASIC METABOLIC PANEL
Anion gap: 6 (ref 5–15)
BUN: 10 mg/dL (ref 8–23)
CO2: 26 mmol/L (ref 22–32)
Calcium: 7.7 mg/dL — ABNORMAL LOW (ref 8.9–10.3)
Chloride: 109 mmol/L (ref 98–111)
Creatinine, Ser: 0.78 mg/dL (ref 0.44–1.00)
GFR calc Af Amer: >60 mL/min (ref >60)
GFR calc non Af Amer: >60 mL/min (ref >60)
Glucose, Bld: 97 mg/dL (ref 70–99)
Potassium: 4 mmol/L (ref 3.5–5.1)
Sodium: 141 mmol/L (ref 135–145)

## 2019-10-02 LAB — CORTISOL-AM, BLOOD: Cortisol - AM: 7.1 ug/dL (ref 6.7–22.6)

## 2019-10-02 LAB — GLUCOSE, CAPILLARY: Glucose-Capillary: 104 mg/dL — ABNORMAL HIGH (ref 70–99)

## 2019-10-02 MED ORDER — POLYETHYLENE GLYCOL 3350 17 G PO PACK: 17.0000 g | PACK | Freq: Every day | ORAL | Status: DC | PRN

## 2019-10-02 MED ORDER — SODIUM CHLORIDE 0.9% FLUSH
3.0000 mL | Freq: Two times a day (BID) | INTRAVENOUS | Status: DC
  Administered 2019-10-02: 3 mL via INTRAVENOUS

## 2019-10-02 MED ORDER — PRAVASTATIN SODIUM 40 MG PO TABS
40.0000 mg | ORAL_TABLET | Freq: Every day | ORAL | Status: DC
  Administered 2019-10-02: 40 mg via ORAL
  Filled 2019-10-02: qty 1

## 2019-10-02 MED ORDER — SODIUM CHLORIDE 0.9 % IV SOLN
3.0000 g | Freq: Four times a day (QID) | INTRAVENOUS | Status: DC
  Administered 2019-10-02 – 2019-10-03 (×4): 3 g via INTRAVENOUS
  Filled 2019-10-02 (×2): qty 3
  Filled 2019-10-02 (×2): qty 8
  Filled 2019-10-02 (×2): qty 3

## 2019-10-02 NOTE — Progress Notes (Signed)
MD requesting lab results from this AM.  Called the lab and they stated they have already sent 2 lab techs to try and draw blood but have been unsuccessful.  Alerted MD via secure chat.

## 2019-10-02 NOTE — Consult Note (Signed)
Pharmacy Antibiotic Note  Kirsten Snyder is a 86 y.o. female admitted on 10/01/2019 with sepsis. Patient recently diagnosed with frontal lobe tumor and is status post craniotomy and tumor resection at [facility]. Source of infection thought to be urine. Patient had Klebsiella pneumoniae growing in urine culture at [facility] on 5/27. Susceptibilities not available. Appears patient was treated with ceftriaxone / cefazolin during that admission. Pharmacy has been consulted for antibiotic dosing.  Pt initially started on meropenem but with UCx + for Enterococcus Faecalis, will adjust to cover this as well as klebsiella.  Plan: Switch meropenem to Unasyn 3 gm q6hr x7 days   Height: 5\' 4"  (162.6 cm) Weight: 105.1 kg (231 lb 11.3 oz) IBW/kg (Calculated) : 54.7  Temp (24hrs), Avg:98.7 F (37.1 C), Min:97.8 F (36.6 C), Max:99.8 F (37.7 C)  Recent Labs  Lab 10/01/19 0604 10/01/19 0811 10/02/19 0841  WBC 16.2*  --  5.5  CREATININE 1.11*  --  0.78  LATICACIDVEN 2.9* 2.5* 1.1    Estimated Creatinine Clearance: 61.9 mL/min (by C-G formula based on SCr of 0.78 mg/dL).    Allergies  Allergen Reactions  . Sulfa Antibiotics     Antimicrobials this admission: Patient received cefepime 2 g x 1 in ED on 6/13 Meropenem 6/13 >> 6/14 Unasyn 6/14 >>  Dose adjustments this admission: n/a  Microbiology results: 6/13 BCx: NG x 24h 6/13 UCx:  >=100,000 COLONIES/mL ENTEROCOCCUS FAECALIS  Thank you for allowing pharmacy to be a part of this patient's care.  [place] Student 10/02/2019 1:58 PM

## 2019-10-02 NOTE — Evaluation (Signed)
Physical Therapy Evaluation Patient Details Name: Kirsten Snyder MRN: [id number] DOB: [birth year] Today's Date: 10/02/2019   History of Present Illness  Pt is an 86 y/o female who was admitted for decreased responsiveness and hypotension from SNF. Pt had recent right frontal craniectomy with resection of a brain mass believed to be a meningioma (final pathology still pending). PMH includes breast cancer breast cancer ER/PR positive HER2 negative status, HTN, dyslipidemia, and asthma. Pt was at [facility] and receiving therapy.  Clinical Impression  Pt in bed upon arrival to room, with visitor present who left to allow pt to participate in PT evaluation. Pt agreeable. Pt reports sharp heel pain bilaterally in standing and a general soreness all over. Pt ambulated 15 feet using RW with min A for steadying and safety. Pt exhibits decreased gait speed however ambulates with reciprocal pattern. Pt distance limited secondary to generalized weakness, fatigue, heel pain, and overall deconditioning. Pt would benefit from skilled therapy to address aforementioned deficits and maximize functional independence and return to PLOF.    Follow Up Recommendations SNF    Equipment Recommendations   (pt has RW and rollator (4-wheeled walker))    Recommendations for Other Services       Precautions / Restrictions Precautions Precautions: Fall Restrictions Weight Bearing Restrictions: No      Mobility  Bed Mobility Overal bed mobility: Modified Independent             General bed mobility comments: required increased time to perform; head of bed was elevated and pt utilized bed rails  Transfers Overall transfer level: Needs assistance Equipment used: Rolling walker (2 wheeled) Transfers: Sit to/from Stand Sit to Stand: Min assist         General transfer comment: Min A for boosting to stand and for balance while transitioning hand from bed to RW. Verbal cues for hand placement for improved transfer  set up.  Ambulation/Gait Ambulation/Gait assistance: Min assist Gait Distance (Feet): 15 Feet Assistive device: Rolling walker (2 wheeled) Gait Pattern/deviations: Decreased step length - right;Decreased step length - left     General Gait Details: Fatigue limited further ambulation distance. Reciprocal gait pattern noted, decreased gait speed, and required min A for balance.  Stairs            Wheelchair Mobility    Modified Rankin (Stroke Patients Only)       Balance Overall balance assessment: Needs assistance Sitting-balance support: Feet supported;No upper extremity supported Sitting balance-Leahy Scale: Good     Standing balance support: Bilateral upper extremity supported Standing balance-Leahy Scale: Fair                               Pertinent Vitals/Pain Pain Assessment: 0-10 Pain Score: 5  Pain Location: bilateral heels and R hip (heel pain is greater than R hip pain) Pain Descriptors / Indicators: Sharp;Discomfort;Sore Pain Intervention(s): Repositioned;Limited activity within patient's tolerance;Monitored during session    [place] expects to be discharged to:: Skilled nursing facility                 Additional Comments: Pt states that she has been at SNF [facility]) x 1 week after craniectomy procedure. Pt reports prior to admission into SNF that she lives in a 1 story home that has an entrance with no steps. States her adult granddaughter lives there. Pt states that her daughter lives next door.    Prior Function Level of Independence:  Independent with assistive device(s)         Comments: Pt states that she was able to ambulate using RW with supervision and reports needing no physical assistance with bed mobility or transfers.     Hand Dominance        Extremity/Trunk Assessment   Upper Extremity Assessment Upper Extremity Assessment: Generalized weakness (generally 4/5 bilaterally)    Lower  Extremity Assessment Lower Extremity Assessment: Generalized weakness (generally 4/5 bilaterally; hip flexion 3+/5 bilaterally)       Communication   Communication: No difficulties  Cognition Arousal/Alertness: Awake/alert Behavior During Therapy: WFL for tasks assessed/performed Overall Cognitive Status: Within Functional Limits for tasks assessed                                        General Comments      Exercises Other Exercises Other Exercises: Pt performed quad sets and heel slides in supine and seated marches while edge of bed. All exercises AROM x 5 reps. Pt required increased time to perform and verbal/tactile cues for continued reps.   Assessment/Plan    PT Assessment Patient needs continued PT services  PT Problem List Decreased strength;Decreased activity tolerance;Decreased balance;Decreased mobility;Pain       PT Treatment Interventions Gait training;Functional mobility training;Therapeutic activities;Therapeutic exercise;Balance training    PT Goals (Current goals can be found in the Care Plan section)  Acute Rehab PT Goals Patient Stated Goal: to walk myself and not be dependent on others so much PT Goal Formulation: With patient Time For Goal Achievement: 10/16/19 Potential to Achieve Goals: Good    Frequency Min 2X/week   Barriers to discharge        Co-evaluation               AM-PAC PT "6 Clicks" Mobility  Outcome Measure Help needed turning from your back to your side while in a flat bed without using bedrails?: A Little Help needed moving from lying on your back to sitting on the side of a flat bed without using bedrails?: A Little Help needed moving to and from a bed to a chair (including a wheelchair)?: A Little Help needed standing up from a chair using your arms (e.g., wheelchair or bedside chair)?: A Little Help needed to walk in hospital room?: A Little Help needed climbing 3-5 steps with a railing? : A Lot 6 Click  Score: 17    End of Session Equipment Utilized During Treatment: Gait belt Activity Tolerance: Patient limited by fatigue;Patient limited by pain Patient left: in chair;with call bell/phone within reach;with chair alarm set Nurse Communication:  (for IV reconnection) PT Visit Diagnosis: Unsteadiness on feet (R26.81);Muscle weakness (generalized) (M62.81);History of falling (Z91.81);Pain Pain - Right/Left: Right Pain - part of body: Hip    Time: 1459-1530 PT Time Calculation (min) (ACUTE ONLY): 31 min   Charges:   PT Evaluation $PT Eval Moderate Complexity: 1 Mod PT Treatments $Therapeutic Exercise: 8-22 mins        Vale Haven, SPT  Philamena Kramar 10/02/2019, 4:54 PM

## 2019-10-02 NOTE — NC FL2 (Signed)
[place] LEVEL OF CARE SCREENING TOOL     IDENTIFICATION  Patient Name: Kirsten Snyder Birthdate: 08/26/1933 Sex: female Admission Date (Current Location): 10/01/2019  [facility] and [place] Number:  Engineering geologist and Address:  [facility], [address], [place], [place] [address]      Provider Number: [id number]  Attending Physician Name and Address:  Val Riles, MD  Relative Name and Phone Number:       Current Level of Care: Hospital Recommended Level of Care: [place] Prior Approval Number:    Date Approved/Denied:   PASRR Number:    Discharge Plan: SNF    Current Diagnoses: Patient Active Problem List   Diagnosis Date Noted  . Sepsis secondary to UTI ([place]) 10/01/2019  . Sepsis ([place]) 10/01/2019  . Hypokalemia 10/01/2019  . Atherosclerosis of abdominal aorta ([place]) 10/12/2018  . SOBOE (shortness of breath on exertion) 10/12/2018  . Osteopenia of neck of right femur 06/29/2018  . Breast cancer, right ([place]) 06/17/2016  . Essential hypertension 12/13/2015  . Breast abscess of female 11/07/2015  . Familial multiple lipoprotein-type hyperlipidemia 10/25/2014  . Clinical depression 10/25/2014  . Acid reflux 10/25/2014  . Acute gastrointestinal bleeding 10/25/2014  . Aggrieved 10/25/2014  . Airway hyperreactivity 10/25/2014  . Arthritis of knee 10/25/2014  . H/O: gout 10/25/2014  . Adiposity 10/25/2014  . Arthritis, degenerative 10/25/2014  . H/O adenomatous polyp of colon 03/07/2014    Orientation RESPIRATION BLADDER Height & Weight     Self, Time, Situation, Place  Normal Continent Weight: 231 lb 11.3 oz (105.1 kg) Height:  5\' 4"  (162.6 cm)  BEHAVIORAL SYMPTOMS/MOOD NEUROLOGICAL BOWEL NUTRITION STATUS      Continent Diet (diet 2 gram sodium)  AMBULATORY STATUS COMMUNICATION OF NEEDS Skin   Extensive Assist Verbally Surgical wounds (closed incision on head, post surgery)                        Personal Care Assistance Level of Assistance  Bathing, Feeding, Dressing Bathing Assistance: Maximum assistance Feeding assistance: Independent Dressing Assistance: Maximum assistance     Functional Limitations Info  Sight, Hearing, Speech Sight Info: Adequate Hearing Info: Adequate Speech Info: Adequate    SPECIAL CARE FACTORS FREQUENCY  PT (By licensed PT), OT (By licensed OT)     PT Frequency: 5x OT Frequency: 5x            Contractures Contractures Info: Not present    Additional Factors Info  Code Status, Allergies Code Status Info: Full Code Allergies Info: Sulfa Anitibiotics           Current Medications (10/02/2019):  This is the current hospital active medication list Current Facility-Administered Medications  Medication Dose Route Frequency Provider Last Rate Last Admin  . 0.9 %  sodium chloride infusion   Intravenous Continuous Agbata, Tochukwu, MD 100 mL/hr at 10/02/19 0907 New Bag at 10/02/19 0907  . acetaminophen (TYLENOL) tablet 650 mg  650 mg Oral Q6H PRN Agbata, Tochukwu, MD   650 mg at 10/01/19 1333  . allopurinol (ZYLOPRIM) tablet 100 mg  100 mg Oral QHS Agbata, Tochukwu, MD   100 mg at 10/01/19 2158  . bisacodyl (DULCOLAX) suppository 10 mg  10 mg Rectal Daily PRN Agbata, Tochukwu, MD      . calcium carbonate (TUMS - dosed in mg elemental calcium) chewable tablet 200 mg of elemental calcium  1 tablet Oral Q8H PRN Agbata, Tochukwu, MD      .  enoxaparin (LOVENOX) injection 40 mg  40 mg Subcutaneous Q24H Benita Gutter, RPH   40 mg at 10/01/19 2036  . fluticasone furoate-vilanterol (BREO ELLIPTA) 100-25 MCG/INH 1 puff  1 puff Inhalation Daily Benita Gutter, RPH   1 puff at 10/02/19 0901  . loratadine (CLARITIN) tablet 10 mg  10 mg Oral Daily Agbata, Tochukwu, MD   10 mg at 10/02/19 0900  . meropenem (MERREM) 1 g in sodium chloride 0.9 % 100 mL IVPB  1 g Intravenous Q12H Benita Gutter, [facility]   Stopped at 10/01/19 2230  . ondansetron (ZOFRAN)  tablet 4 mg  4 mg Oral Q6H PRN Agbata, Tochukwu, MD       Or  . ondansetron (ZOFRAN) injection 4 mg  4 mg Intravenous Q6H PRN Agbata, Tochukwu, MD      . pantoprazole (PROTONIX) EC tablet 40 mg  40 mg Oral Daily Agbata, Tochukwu, MD   40 mg at 10/02/19 0900  . polyethylene glycol (MIRALAX / GLYCOLAX) packet 17 g  17 g Oral Daily PRN Val Riles, MD      . pravastatin (PRAVACHOL) tablet 10 mg  10 mg Oral q1800 Agbata, Tochukwu, MD   10 mg at 10/01/19 1711  . senna (SENOKOT) tablet 17.2 mg  2 tablet Oral QHS Agbata, Tochukwu, MD      . sertraline (ZOLOFT) tablet 50 mg  50 mg Oral Daily Agbata, Tochukwu, MD   50 mg at 10/02/19 0900  . traZODone (DESYREL) tablet 50 mg  50 mg Oral QHS Agbata, Tochukwu, MD   50 mg at 10/01/19 2036  . umeclidinium bromide (INCRUSE ELLIPTA) 62.5 MCG/INH 1 puff  1 puff Inhalation Daily Benita Gutter, RPH   1 puff at 10/02/19 0901     Discharge Medications: Please see discharge summary for a list of discharge medications.  Relevant Imaging Results:  Relevant Lab Results:   Additional Information [id number]  Gerrianne Scale Elzie Sheets, LCSW

## 2019-10-02 NOTE — Progress Notes (Signed)
Called to assess patient with low blood pressure in spite of IV fluids with a low MAP. Patient responded a 1 L normal saline bolus and maintaining MAP over 65 Close monitoring to continue

## 2019-10-02 NOTE — Progress Notes (Addendum)
[facility] Progress Note  Patient: Kirsten Snyder    GUR:[id number]  DOA: 10/01/2019     Date of Service: the patient was seen and examined on 10/02/2019  Chief Complaint  Patient presents with  . Hypotension   Brief hospital course: MRYTLE BENTO is a 86 y.o. female with medical history significant for breast cancer ER/PR positive HER2 negative status post excision and sentinel node biopsy and has completed 5 years of anastrozole without recurrence, hypertension, dyslipidemia, asthma, status post right frontal craniectomy with resection of a brain mass thought to be a meningioma (Final pathology is still pending)  on 06/01 who was brought into the emergency room from the skilled nursing facility where she was discharged to following her recent hospitalization at [facility] for evaluation of decreased responsiveness and hypotension. EMS was called in because patient was said to be unresponsive but as soon as she was transferred from the chair to the bed the patient became more awake but was confused about the situation.    Patient was hypotensive in the field with systolic blood pressure in the 80s and had a low-grade temp with a T-max of 100.19F, she also has pyuria.  Code sepsis was called and patient received a dose of cefepime as well as IV fluids.  Her blood pressure has improved.  Family initially wanted her transfer to [facility] but he had no beds available, so patient was admitted here.   Assessment and Plan: Assessment/Plan Principal Problem:   Sepsis secondary to UTI [facility]) Active Problems:   Clinical depression   Sepsis ([place])   Hypokalemia     Sepsis secondary to UTI Patient presents to the emergency room for evaluation of decreased level of responsiveness and was found to be hypotensive with systolic blood pressure in the 80s She had a low-grade temp with a T-max of 100.19F, leukocytosis with a white cell count of 16,000 and elevated lactic acid level. Urine culture  from her last hospitalization at [facility] yielded Klebsiella pneumonia, so pt was given IV meropenem.  Urine culture growing Enterococcus faecalis, discontinued cefepime and started Unasyn 3 g IV every 6 hourly for 7 days Lactic acidosis resolved, WBC count improved to normal Blood culture negative so far Continue IV fluid hydration   Hypertension Hold losartan and furosemide since patient came in hypotensive Family is concerned that her blood pressure has been on the low side since she went to rehab Will review medications prior to discharge to determine if patient still needs to be on losartan and furosemide   Hypokalemia Related to diuretic therapy/GI loss Supplement potassium   GERD  Continue oral PPI   Depression Continue sertraline and trazodone    Status post recent resection of meningioma Keep scheduled follow-up with neurosurgery as an outpatient   History of diarrhea in a patient who recently completed antibiotic therapy We will send stool for C. difficile toxin if recurrence of diarrhea   Body mass index is 39.77 kg/m.  Interventions:   Follow PT/OT eval, possible discharge tomorrow if patient remains stable     Diet: Cardiac diet DVT Prophylaxis: Subcutaneous Lovenox   Advance goals of care discussion: Full code  Family Communication: no family member was present at bedside, at the time of interview.  The pt provided permission to discuss medical plan with the family. Opportunity was given to ask question and all questions were answered satisfactorily.   Disposition:  Pt is from SNF, admitted with sepsis, awaiting for cultures, which precludes a safe  discharge. Discharge to SNF, when vitals are stable and cultures negative.  Most likely tomorrow a.m.  Subjective: No overnight issues, patient was seen and examined at bedside, patient had dysuria and diarrhea which has been resolved.  Patient denied any chest pain or palpitations, no shortness  of breath, no abdominal pain, no nausea or vomiting   Physical Exam: General:  alert oriented to time, place, and person.  Appear in no distress, affect appropriate Eyes: PERRLA ENT: Oral Mucosa Clear, moist  Neck: no JVD,  Cardiovascular: S1 and S2 Present, no Murmur,  Respiratory: good respiratory effort, Bilateral Air entry equal and Decreased, no Crackles, no wheezes Abdomen: Bowel Sound present, Soft and no tenderness,  Skin: no rashes  Extremities: no Pedal edema, no calf tenderness Neurologic: without any new focal findings Gait not checked due to patient safety concerns  Vitals:   10/02/19 0635 10/02/19 0718 10/02/19 1108 10/02/19 1153  BP:  (!) 119/58 (!) 118/52 (!) 123/39  Pulse:  76 72 74  Resp:  _0 Temp: 98.7 F (37.1 C) 98.8 F (37.1 C) 98.5 F (36.9 C) 98.6 F (37 C)  TempSrc: Oral Oral Oral   SpO2:  97% 91% 96%  Weight:      Height:        Intake/Output Summary (Last 24 hours) at 10/02/2019 1255 Last data filed at 10/02/2019 0750 Gross per 24 hour  Intake 1515.44 ml  Output 1520 ml  Net -4.56 ml   Filed Weights   10/01/19 0412 10/01/19 1204 10/02/19 0554  Weight: 107 kg 102.8 kg 105.1 kg    Data Reviewed: I have personally reviewed and interpreted daily labs, tele strips, imagings as discussed above. I reviewed all nursing notes, pharmacy notes, vitals, pertinent old records I have discussed plan of care as described above with RN and patient/family.  CBC: Recent Labs  Lab 10/01/19 0604 10/02/19 0841  WBC 16.2* 5.5  NEUTROABS 13.0*  --   HGB 10.9* 9.2*  HCT 32.8* 27.3*  MCV 97.3 97.8  PLT 291 329   Basic Metabolic Panel: Recent Labs  Lab 10/01/19 0604 10/02/19 0841  NA 139 141  K 3.4* 4.0  CL 103 109  CO2 25 26  GLUCOSE 109* 97  BUN 18 10  CREATININE 1.11* 0.78  CALCIUM 8.4* 7.7*    Studies: No results found.  Scheduled Meds: . allopurinol  100 mg Oral QHS  . enoxaparin (LOVENOX) injection  40 mg Subcutaneous Q24H    . fluticasone furoate-vilanterol  1 puff Inhalation Daily  . loratadine  10 mg Oral Daily  . pantoprazole  40 mg Oral Daily  . pravastatin  40 mg Oral q1800  . senna  2 tablet Oral QHS  . sertraline  50 mg Oral Daily  . traZODone  50 mg Oral QHS  . umeclidinium bromide  1 puff Inhalation Daily   Continuous Infusions: . sodium chloride 100 mL/hr at 10/02/19 0907  . meropenem (MERREM) IV 1 g (10/02/19 1218)   PRN Meds: acetaminophen, bisacodyl, calcium carbonate, ondansetron **OR** ondansetron (ZOFRAN) IV, polyethylene glycol  Time spent: 35 minutes  Author: Val Riles. MD [facility] 10/02/2019 12:55 PM  To reach On-call, see care teams to locate the attending and reach out to them via [email or web address]. If 7PM-7AM, please contact night-coverage If you still have difficulty reaching the attending provider, please page the [facility] (Director on Call) for [facility] on amion for assistance.

## 2019-10-02 NOTE — Evaluation (Signed)
Occupational Therapy Evaluation Patient Details Name: Kirsten Snyder MRN: [id number] DOB: [birth year] Today's Date: 10/02/2019    History of Present Illness 86 y/o female who presents to the ED hypotensive and with decreased responsiveness via EMS from SNF. S/p right frontal craniectomy with resection of a brain mass thought to be a meningioma (Final pathology is still pending) on 06/01. PMH significant for breast cancer ER/PR positive HER2 negative status post excision and sentinel node biopsy and has completed 5 years of anastrozole without recurrence, hypertension, dyslipidemia and asthma   Clinical Impression   [name] presents this morning alert and agreeable to get out of bed. She lives at home with her 37 y/o granddaughter, but has been at a SNF for short term rehab for the past week following hospitalization at [facility]. Prior to SNF, she was indep in all ADL, enjoyed cross stitching and crocheting and used a 4WW for household ambulation. She had quit driving and her children run errands for her. Pt had 2 falls in the past year and endorses feeling generally unsteady when walking at baseline. She was using a 2WW at [facility] for ambulation. This session, pt completed bed mobility with Mod I and sit <> stand from EOB and BSC with Min A +2 for safety/equipment and RW. She completed pericare at [facility] with Setup A and intermittent RW use for balance. BP was monitored for pt throughout: 102/50 supine in bed, 134/65 at EOB and 135/62 during toilet t/f. Pt will benefit from skilled acute OT services to address deficits (see OT Problem List) impacting pt participation in ADL/IADL. Recommend continued therapy at SNF upon discharge to optimize pt recovery.    Follow Up Recommendations  SNF    Equipment Recommendations  Other (comment) (TBD at next venue of care)    Recommendations for Other Services       Precautions / Restrictions Precautions Precautions: Fall Restrictions Weight Bearing  Restrictions: No      Mobility Bed Mobility Overal bed mobility: Modified Independent             General bed mobility comments: HOB elevated and cues for sequencing, increased time and effort; no physical assist provided  Transfers Overall transfer level: Needs assistance Equipment used: Rolling walker (2 wheeled) Transfers: Sit to/from Stand Sit to Stand: Min assist;+2 safety/equipment         General transfer comment: Min A +2 and RW for sit <> stand t/f from EOB and BSC; 2 attempts for stand from [facility] with improved positioning and greater success on second attempt    Balance Overall balance assessment: Needs assistance Sitting-balance support: Feet supported;No upper extremity supported Sitting balance-Leahy Scale: Good Sitting balance - Comments: indep weight shifted to EOB, steady when static and when reaching for walker   Standing balance support: No upper extremity supported;During functional activity Standing balance-Leahy Scale: Fair Standing balance comment: able to stand unsupported during pericare with brief hesitation, requires BUE support using RW for maintaining balance during ambulation                           ADL either performed or assessed with clinical judgement   ADL Overall ADL's : Needs assistance/impaired                         Toilet Transfer: +2 for safety/equipment;Minimal assistance;BSC;RW;Cueing for sequencing;Cueing for safety;Ambulation Toilet Transfer Details (indicate cue type and reason): Min A for standing  from bed using RW, cues provided for positioning and safety, +2 for safety/equipment mgt; BP checked prior to/during toilet t/f Toileting- Clothing Manipulation and Hygiene: Set up;With adaptive equipment;Sit to/from stand Toileting - Clothing Manipulation Details (indicate cue type and reason): using RW for stability intermittently     Functional mobility during ADLs: +2 for safety/equipment;Min guard;Minimal  assistance;Cueing for sequencing;Cueing for safety;Rolling walker General ADL Comments: Generally Min A for t/f and Min Guard during ambulation with RW and +2 for safety/lines. Anticipate Mod A for sit <> stand LB dressing and Mod A for LB bathing. Grossly Setup A for bed level UB ADL and CGA-Min A for standing ADL with RW. BP monitored throughout session: prior to mobility BP at 102/50, increased to 134/65 at EOB and 135/62 during toilet t/f. Pt endorsed minimal lightheadedness, said movement felt good.     Vision Baseline Vision/History: Wears glasses Wears Glasses: Reading only Patient Visual Report: No change from baseline       Perception     Praxis      Pertinent Vitals/Pain Pain Assessment: Faces Faces Pain Scale: Hurts whole lot (during BP reading) Pain Location: L arm Pain Descriptors / Indicators: Discomfort;Moaning;Pressure Pain Intervention(s): Monitored during session;Repositioned;Utilized relaxation techniques     Hand Dominance     Extremity/Trunk Assessment Upper Extremity Assessment Upper Extremity Assessment: Overall WFL for tasks assessed   Lower Extremity Assessment Lower Extremity Assessment: Generalized weakness (pt endorses swelling and pain in B ankles, chronic issue, does report discomfort with ambulation)       Communication Communication Communication: No difficulties   Cognition Arousal/Alertness: Awake/alert Behavior During Therapy: WFL for tasks assessed/performed Overall Cognitive Status: Within Functional Limits for tasks assessed                                 General Comments: alert and oriented x4, no cognitive concerns noted   General Comments       Exercises Other Exercises Other Exercises: Pt educated re: safety during ADL and self-monitoring symptoms and safe DME use during toilet t/f   Shoulder Instructions      Home Living Family/patient expects to be discharged to:: Skilled nursing facility                                  Additional Comments: Pt reports she has been at [facility] for 1 week following discharge from [facility]. Prior to that, she lived at home with her 21 y/o granddaughter.      Prior Functioning/Environment Level of Independence: Independent with assistive device(s)        Comments: Pt reports indep in all ADL, though no longer driving, her children run errands for her and she enjoys cross stitching and crocheting. She uses a 4WW at home for most ambulation and says that she feels generally unsteady at baseline. Reports 2 falls in the past 12 months (same day- leaned over and lost her balance).        OT Problem List: Decreased strength;Decreased range of motion;Decreased activity tolerance;Impaired balance (sitting and/or standing);Decreased knowledge of use of DME or AE;Pain      OT Treatment/Interventions: Self-care/ADL training;Therapeutic exercise;Therapeutic activities;DME and/or AE instruction;Patient/family education;Balance training    OT Goals(Current goals can be found in the care plan section) Acute Rehab OT Goals Patient Stated Goal: To get back to working with rehab OT Goal Formulation: With patient  Time For Goal Achievement: 10/16/19 Potential to Achieve Goals: Good ADL Goals Pt Will Perform Lower Body Dressing: sit to/from stand;with adaptive equipment;with modified independence Pt Will Transfer to Toilet: bedside commode;regular height toilet;ambulating;with supervision (BSC over regular height toilet) Additional ADL Goal #1: Pt will independently verbalize >3 bladder mgt and toileting routine strategies to optimize overall health and prevent infection  OT Frequency: Min 2X/week   Barriers to D/C:            Co-evaluation              AM-PAC OT "6 Clicks" Daily Activity     Outcome Measure Help from another person eating meals?: None Help from another person taking care of personal grooming?: None Help from another person toileting, which  includes using toliet, bedpan, or urinal?: A Little Help from another person bathing (including washing, rinsing, drying)?: A Lot Help from another person to put on and taking off regular upper body clothing?: A Little Help from another person to put on and taking off regular lower body clothing?: A Lot 6 Click Score: 18   End of Session Equipment Utilized During Treatment: Gait belt;Rolling walker Nurse Communication: Mobility status;Other (comment) (enteric precautions, discharged prior to session)  Activity Tolerance: Patient tolerated treatment well Patient left: in bed;with call bell/phone within reach;with bed alarm set  OT Visit Diagnosis: Other abnormalities of gait and mobility (R26.89);History of falling (Z91.81);Repeated falls (R29.6);Muscle weakness (generalized) (M62.81);Pain Pain - Right/Left: Left (right and left) Pain - part of body: Ankle and joints of foot (reported with ambulation)                Time: 0037-0488 OT Time Calculation (min): 39 min Charges:  OT General Charges $OT Visit: 1 Visit OT Evaluation $OT Eval Moderate Complexity: 1 Mod OT Treatments $Self Care/Home Management : 23-37 mins  Jerilynn Birkenhead, OTS 10/02/19, 12:10 PM

## 2019-10-03 DIAGNOSIS — N390 Urinary tract infection, site not specified: Secondary | ICD-10-CM | POA: Diagnosis present

## 2019-10-03 LAB — URINE CULTURE: Culture: >=100000 — AB

## 2019-10-03 LAB — BASIC METABOLIC PANEL
Anion gap: 9 (ref 5–15)
BUN: 7 mg/dL — ABNORMAL LOW (ref 8–23)
CO2: 25 mmol/L (ref 22–32)
Calcium: 8.2 mg/dL — ABNORMAL LOW (ref 8.9–10.3)
Chloride: 108 mmol/L (ref 98–111)
Creatinine, Ser: 0.75 mg/dL (ref 0.44–1.00)
GFR calc Af Amer: >60 mL/min (ref >60)
GFR calc non Af Amer: >60 mL/min (ref >60)
Glucose, Bld: 113 mg/dL — ABNORMAL HIGH (ref 70–99)
Potassium: 3.8 mmol/L (ref 3.5–5.1)
Sodium: 142 mmol/L (ref 135–145)

## 2019-10-03 LAB — CBC
HCT: 26.4 % — ABNORMAL LOW (ref 36.0–46.0)
Hemoglobin: 8.5 g/dL — ABNORMAL LOW (ref 12.0–15.0)
MCH: 32.3 pg (ref 26.0–34.0)
MCHC: 32.2 g/dL (ref 30.0–36.0)
MCV: 100.4 fL — ABNORMAL HIGH (ref 80.0–100.0)
Platelets: 208 10*3/uL (ref 150–400)
RBC: 2.63 MIL/uL — ABNORMAL LOW (ref 3.87–5.11)
RDW: 14.1 % (ref 11.5–15.5)
WBC: 5.2 10*3/uL (ref 4.0–10.5)
nRBC: 0 % (ref 0.0–0.2)

## 2019-10-03 LAB — PHOSPHORUS: Phosphorus: 3.1 mg/dL (ref 2.5–4.6)

## 2019-10-03 LAB — MAGNESIUM: Magnesium: 1.6 mg/dL — ABNORMAL LOW (ref 1.7–2.4)

## 2019-10-03 MED ORDER — AMOXICILLIN 500 MG PO CAPS
500.0000 mg | ORAL_CAPSULE | Freq: Three times a day (TID) | ORAL | Status: DC
  Filled 2019-10-03 (×3): qty 1

## 2019-10-03 MED ORDER — FUROSEMIDE 20 MG PO TABS: 20.0000 mg | ORAL_TABLET | Freq: Every day | ORAL | 1 refills | Status: DC | PRN

## 2019-10-03 MED ORDER — LOSARTAN POTASSIUM 25 MG PO TABS: 25.0000 mg | ORAL_TABLET | Freq: Every day | ORAL | Status: DC

## 2019-10-03 MED ORDER — MAGNESIUM SULFATE 2 GM/50ML IV SOLN
2.0000 g | Freq: Once | INTRAVENOUS | Status: AC
  Administered 2019-10-03: 2 g via INTRAVENOUS
  Filled 2019-10-03: qty 50

## 2019-10-03 MED ORDER — ENOXAPARIN SODIUM 40 MG/0.4ML ~~LOC~~ SOLN: 40.0000 mg | Freq: Two times a day (BID) | SUBCUTANEOUS | Status: DC

## 2019-10-03 MED ORDER — AMOXICILLIN 500 MG PO CAPS: 500.0000 mg | ORAL_CAPSULE | Freq: Three times a day (TID) | ORAL | 0 refills | Status: AC

## 2019-10-03 NOTE — TOC Initial Note (Signed)
Transition of Care [facility]) - Initial/Assessment Note    Patient Details  Name: Kirsten Snyder MRN: [id number] Date of Birth: Oct 22, 1933  Transition of Care [facility]) CM/SW Contact:    Eileen Stanford, LCSW Phone Number: 10/03/2019, 1:32 PM  Clinical Narrative:   Pt is from [facility]. Pt is agreeable to return at d/c. Pt's family did do a bed hold. CSW will follow up with facility.               Expected Discharge Plan: Skilled Nursing Facility Barriers to Discharge: No Barriers Identified   Patient Goals and CMS Choice Patient states their goals for this hospitalization and ongoing recovery are:: to get stronger   Choice offered to / list presented to : Patient  Expected Discharge Plan and Services Expected Discharge Plan: [place] In-house Referral: Clinical Social Work   Post Acute Care Choice: [place] Living arrangements for the past 2 months: [place]                                      Prior Living Arrangements/Services Living arrangements for the past 2 months: [place] Lives with:: Other (Comment) (granddaughter) Patient language and need for interpreter reviewed:: Yes Do you feel safe going back to the place where you live?: Yes      Need for Family Participation in Patient Care: Yes (Comment) Care giver support system in place?: Yes (comment)   Criminal Activity/Legal Involvement Pertinent to Current Situation/Hospitalization: No - Comment as needed  Activities of Daily Living Home Assistive Devices/Equipment: Eyeglasses, Environmental consultant (specify type), Scales, Nebulizer ADL Screening (condition at time of admission) Patient's cognitive ability adequate to safely complete daily activities?: Yes Is the patient deaf or have difficulty hearing?: No Does the patient have difficulty seeing, even when wearing glasses/contacts?: No Does the patient have difficulty concentrating, remembering, or making  decisions?: Yes Patient able to express need for assistance with ADLs?: Yes Does the patient have difficulty dressing or bathing?: No Independently performs ADLs?: No Communication: Independent Dressing (OT): Needs assistance Is this a change from baseline?: Change from baseline, expected to last <3days Grooming: Needs assistance Is this a change from baseline?: Change from baseline, expected to last <3 days Feeding: Independent Bathing: Needs assistance Is this a change from baseline?: Change from baseline, expected to last <3 days Toileting: Needs assistance Is this a change from baseline?: Change from baseline, expected to last <3 days In/Out Bed: Needs assistance Is this a change from baseline?: Change from baseline, expected to last <3 days Walks in Home: Needs assistance Is this a change from baseline?: Change from baseline, expected to last <3 days Does the patient have difficulty walking or climbing stairs?: Yes Weakness of Legs: Both Weakness of Arms/Hands: None  Permission Sought/Granted Permission sought to share information with : Family Supports    Share Information with NAME: gail Hydrographic surveyor granted to share info w AGENCY: [place] granted to share info w Relationship: daughter     Emotional Assessment Appearance:: Appears stated age Attitude/Demeanor/Rapport: Engaged Affect (typically observed): Accepting, Appropriate Orientation: : Oriented to Self, Oriented to Place, Oriented to  Time, Oriented to Situation Alcohol / Substance Use: Not Applicable Psych Involvement: No (comment)  Admission diagnosis:  Delirium [R41.0] S/P craniotomy [Z98.890] Sepsis ([place]) [A41.9] Urinary tract infection without hematuria, site unspecified [N39.0] Sepsis, due to unspecified organism, unspecified whether  acute organ dysfunction present [facility]) [A41.9] Patient Active Problem List   Diagnosis Date Noted  . Sepsis secondary to UTI ([place]) 10/01/2019  . Sepsis  ([place]) 10/01/2019  . Hypokalemia 10/01/2019  . Atherosclerosis of abdominal aorta ([place]) 10/12/2018  . SOBOE (shortness of breath on exertion) 10/12/2018  . Osteopenia of neck of right femur 06/29/2018  . Breast cancer, right ([place]) 06/17/2016  . Essential hypertension 12/13/2015  . Breast abscess of female 11/07/2015  . Familial multiple lipoprotein-type hyperlipidemia 10/25/2014  . Clinical depression 10/25/2014  . Acid reflux 10/25/2014  . Acute gastrointestinal bleeding 10/25/2014  . Aggrieved 10/25/2014  . Airway hyperreactivity 10/25/2014  . Arthritis of knee 10/25/2014  . H/O: gout 10/25/2014  . Adiposity 10/25/2014  . Arthritis, degenerative 10/25/2014  . H/O adenomatous polyp of colon 03/07/2014   PCP:  [name], MD Pharmacy:   [facility], [address] [place] [place] [address] Phone: [phone] Fax: [phone]     Social Determinants of Health (SDOH) Interventions    Readmission Risk Interventions No flowsheet data found.

## 2019-10-03 NOTE — Discharge Summary (Signed)
[facility] Discharge Summary   Patient: Kirsten Snyder:[id number]  PCP: Kirsten Patch, MD  Date of admission: 10/01/2019   Date of discharge:  10/03/2019     Discharge Diagnoses:  Principal Problem:   Sepsis secondary to UTI [facility]) Active Problems:   Clinical depression   Sepsis ([place])   Hypokalemia   Admitted From: SNF Disposition:  SNF   Recommendations for Outpatient Follow-up:  1. PCP: in 1-2 days, monitor blood pressure closely, decrease dose of losartan and change Lasix to as needed.  Continue to biotics for 5 additional days. 2. Follow-up with neurosurgery as per schedule.   Contact information for after-discharge care    Destination    [facility] .   Service: Skilled Nursing Contact information: [place] [phone]                 Diet recommendation: Cardiac diet  Activity: The patient is advised to gradually reintroduce usual activities, as tolerated  Discharge Condition: stable  Code Status: Full code   History of present illness: As per the H and P dictated on admission. Kirsten Mihok Murphyis a 86 y.o.femalewith medical history significant forbreast cancerER/PRpositiveHER2 negativestatus post excision and sentinel node biopsy andhascompleted 5 years of anastrozole without recurrence, hypertension, dyslipidemia, asthma, status post right frontal craniectomy with resection of a brain massthought to be a meningioma (Final pathology is still pending)on06/01who was brought into the emergency room from the skilled nursing facility where she was discharged [facility]following [facility] of decreased responsiveness and hypotension. EMS was called in because patient was said to be unresponsive but as soon as she was transferred from the chair to the bed the patient became more awake but was confused about the situation.   Patient was hypotensive in  the field with systolic blood pressure in the 80s and had a low-grade temp with a T-max of 100.78F,she also has pyuria.Code sepsis was called and patient received a dose of cefepime as well as IV fluids.Her blood pressure has improved. Family initially wanted her transfer to [facility] but he had no beds available, so patient was admitted here.  Hospital Course:  Assessment/Plan Principal Problem: Sepsis secondary to UTI [facility]) Active Problems: Clinical depression Sepsis ([place]) Hypokalemia   Sepsis secondary to UTI.  Sepsis has been resolved Patient presents to the emergency room for evaluation of decreased level of responsiveness and was found to be hypotensive with systolic blood pressure in the 80s, She had a low-grade temp with a T-max 100.78F,leukocytosis with a white cell count of 16,000 and elevated lactic acid level. Urine culture from her last hospitalization at [facility] yielded Klebsiella pneumonia, so pt was givenIV meropenem. Urine culture growing Enterococcus faecalis, discontinued cefepime and started Unasyn 3 g IV every 6 hourly, sensitivity came back to amoxicillin so started on amoxicillin 500 mg p.o. 3 times daily for 5 additional days. Lactic acidosis resolved, WBC count improved to normal. Blood culture negative so far. S/p IV fluid hydration.  Continue adequate hydration and monitor blood pressure.  Hypertension, Hold losartan and furosemidesince patient came in hypotensive. Family is concerned that her blood pressure has been on the low side since she went to rehab.  Blood pressure still remains soft so I decreased the dose of losartan 25 mg p.o. daily and changed Lasix to as needed.  Monitor blood pressure and then give medications, if SBP >130 Hypokalemia, Resolved. Related to diuretic therapy/GI loss.  Supplemented potassium GERD. Continue oral PPI Depression, Continue sertraline and trazodone Status post recent resection of meningioma. Keep scheduled  follow-up with neurosurgery as an outpatient History of diarrhea in a patient who recently completed antibiotic therapy. No diarrhea since admission. Body mass index is 40.32 kg/m.  Nutrition Interventions: PT/OT eval done rec SNF placement, discussed with patient's family at bedside and agreed with the discharge planning.   Patient was seen by physical therapy, who recommended SNF. On the day of the discharge the patient's vitals were stable, and no other acute medical condition were reported by patient. the patient was felt safe to be discharge at [facility].  Consultants: None Procedures: None  Discharge Exam: General: Appear in mild distress, no Rash; Oral Mucosa Clear, moist. Cardiovascular: S1 and S2 Present, no Murmur, Respiratory: normal respiratory effort, Bilateral Air entry present and no Crackles, no wheezes Abdomen: Bowel Sound present, Soft and no tenderness, no hernia Extremities: no Pedal edema, no calf tenderness Neurology: alert and oriented to time, place, and person affect appropriate.  Forehead staples intact, recommended to follow with neurosurgery.  Filed Weights   10/01/19 1204 10/02/19 0554 10/03/19 0532  Weight: 102.8 kg 105.1 kg 106.5 kg   Vitals:   10/03/19 0732 10/03/19 1223  BP: 137/61 (!) 111/46  Pulse: 82 76  Resp: 18 18  Temp: 98.8 F (37.1 C)   SpO2: 97% 97%    DISCHARGE MEDICATION: Allergies as of 10/03/2019      Reactions   Sulfa Antibiotics       Medication List    STOP taking these medications   fluconazole 150 MG tablet Commonly known as: DIFLUCAN     TAKE these medications   albuterol 108 (90 Base) MCG/ACT inhaler Commonly known as: VENTOLIN HFA Inhale 2 puffs into the lungs every 6 (six) hours as needed for wheezing or shortness of breath.   allopurinol 100 MG tablet Commonly known as: ZYLOPRIM 1 tablet by mouth daily What changed:   how much to take  how to take this  when to take this  additional instructions     amoxicillin 500 MG capsule Commonly known as: AMOXIL Take 1 capsule (500 mg total) by mouth every 8 (eight) hours for 5 days.   bisacodyl 10 MG suppository Commonly known as: DULCOLAX Place 10 mg rectally daily as needed. For no bowel movement in 2 days or more.   calcium carbonate 500 MG chewable tablet Commonly known as: TUMS - dosed in mg elemental calcium Chew 1 tablet by mouth every 8 (eight) hours as needed for indigestion.   cetirizine 10 MG tablet Commonly known as: ZYRTEC Take 10 mg by mouth daily. For allergies.   furosemide 20 MG tablet Commonly known as: LASIX Take 1 tablet (20 mg total) by mouth daily as needed for fluid or edema. TAKE (1) TABLET BY MOUTH EVERY DAY What changed:   how much to take  how to take this  when to take this  reasons to take this   losartan 25 MG tablet Commonly known as: COZAAR Take 1 tablet (25 mg total) by mouth daily. Hold if SBP is less than or equal to 90. What changed:   medication strength  how much to take  how to take this  when to take this  additional instructions   lovastatin 40 MG tablet Commonly known as: MEVACOR TAKE (1) TABLET BY MOUTH DAILY AT BEDTIME   omeprazole 20 MG capsule Commonly known as: PRILOSEC TAKE ONE (1) CAPSULE EACH DAY.  polyethylene glycol powder 17 GM/SCOOP powder Commonly known as: GLYCOLAX/MIRALAX Take 17 g by mouth daily.   senna 8.6 MG tablet Commonly known as: SENOKOT Take 2 tablets by mouth at bedtime. for constipation.   sertraline 50 MG tablet Commonly known as: ZOLOFT TAKE (1) TABLET BY MOUTH EVERY DAY   traZODone 50 MG tablet Commonly known as: DESYREL TAKE (1) TABLET BY MOUTH DAILY AT BEDTIME   Trelegy Ellipta 100-62.5-25 MCG/INH Aepb Generic drug: Fluticasone-Umeclidin-Vilant Inhale 1 puff into the lungs daily.            Discharge Care Instructions  (From admission, onward)         Start     Ordered   10/03/19 0000  Discharge wound care:        Comments: Continue wound care as per instruction by neurosurgery   10/03/19 1358         Allergies  Allergen Reactions  . Sulfa Antibiotics    Discharge Instructions    Call MD for:  difficulty breathing, headache or visual disturbances   Complete by: As directed    Call MD for:  extreme fatigue   Complete by: As directed    Call MD for:  persistant dizziness or light-headedness   Complete by: As directed    Call MD for:  persistant nausea and vomiting   Complete by: As directed    Call MD for:  severe uncontrolled pain   Complete by: As directed    Call MD for:  temperature >100.4   Complete by: As directed    Diet - low sodium heart healthy   Complete by: As directed    Discharge instructions   Complete by: As directed    Follow with PCP, patient should be seen at the facility in 1 to 2 days by an MD, monitor blood pressure closely, decrease dose of losartan to 25 mg p.o. daily due to soft blood pressure, and changed Lasix to as needed.  Continue antibiotics for 5 additional days of amoxicillin for UTI. Follow-up with neurosurgery as scheduled   Discharge wound care:   Complete by: As directed    Continue wound care as per instruction by neurosurgery   Increase activity slowly   Complete by: As directed       The results of significant diagnostics from this hospitalization (including imaging, microbiology, ancillary and laboratory) are listed below for reference.    Significant Diagnostic Studies: CT Head Wo Contrast  Result Date: 10/01/2019 CLINICAL DATA:  Frontal mass resection EXAM: CT HEAD WITHOUT CONTRAST TECHNIQUE: Contiguous axial images were obtained from the base of the skull through the vertex without intravenous contrast. COMPARISON:  None. FINDINGS: Brain: Encephalomalacia and white matter hypoattenuation within the right greater than left frontal lobes. No acute intracranial hemorrhage. No midline shift or other mass effect. Generalized atrophy. Vascular: No  hyperdense vessel or unexpected calcification. Skull: Recent right frontal craniotomy. Sinuses/Orbits: No acute finding. Other: None. IMPRESSION: Encephalomalacia and white matter hypoattenuation within the right greater than left frontal lobes, status post recent right frontal craniotomy. No acute findings. Electronically Signed   By: Ulyses Jarred M.D.   On: 10/01/2019 05:37   DG Chest Port 1 View  Result Date: 10/01/2019 CLINICAL DATA:  Sepsis EXAM: PORTABLE CHEST 1 VIEW COMPARISON:  08/19/2018 FINDINGS: The heart size and mediastinal contours are within normal limits. Both lungs are clear. The visualized skeletal structures are unremarkable. IMPRESSION: No active disease. Electronically Signed   By: Cletus Gash.D.  On: 10/01/2019 05:53    Microbiology: Recent Results (from the past 240 hour(s))  Urine culture     Status: Abnormal   Collection Time: 10/01/19  4:49 AM   Specimen: In/Out Cath Urine  Result Value Ref Range Status   Specimen Description   Final    IN/OUT CATH URINE Performed at [facility] [address], [place], [place] [address]    Special Requests   Final    NONE Performed at [facility], [address] [place], [place] [address]    Culture >=100,000 COLONIES/mL ENTEROCOCCUS FAECALIS (A)  Final   Report Status 10/03/2019 FINAL  Final   Organism ID, Bacteria ENTEROCOCCUS FAECALIS (A)  Final      Susceptibility   Enterococcus faecalis - MIC*    AMPICILLIN <=2 SENSITIVE Sensitive     NITROFURANTOIN <=16 SENSITIVE Sensitive     VANCOMYCIN 1 SENSITIVE Sensitive     * >=100,000 COLONIES/mL ENTEROCOCCUS FAECALIS  SARS Coronavirus 2 by RT PCR (hospital order, performed in [place] hospital lab) Nasopharyngeal Nasopharyngeal Swab     Status: None   Collection Time: 10/01/19  4:49 AM   Specimen: Nasopharyngeal Swab  Result Value Ref Range Status   SARS Coronavirus 2 NEGATIVE NEGATIVE Final    Comment: (NOTE) SARS-CoV-2 target nucleic acids  are NOT DETECTED.  The SARS-CoV-2 RNA is generally detectable in upper and lower respiratory specimens during the acute phase of infection. The lowest concentration of SARS-CoV-2 viral copies this assay can detect is 250 copies / mL. A negative result does not preclude SARS-CoV-2 infection and should not be used as the sole basis for treatment or other patient management decisions.  A negative result may occur with improper specimen collection / handling, submission of specimen other than nasopharyngeal swab, presence of viral mutation(s) within the areas targeted by this assay, and inadequate number of viral copies (<250 copies / mL). A negative result must be combined with clinical observations, patient history, and epidemiological information.  Fact Sheet for Patients:   StrictlyIdeas.no  Fact Sheet for Healthcare Providers: BankingDealers.co.za  This test is not yet approved or  cleared by the [place] FDA and has been authorized for detection and/or diagnosis of SARS-CoV-2 by FDA under an Emergency Use Authorization (EUA).  This EUA will remain in effect (meaning this test can be used) for the duration of the COVID-19 declaration under Section 564(b)(1) of the Act, 21 U.S.C. section 360bbb-3(b)(1), unless the authorization is terminated or revoked sooner.  Performed at [facility], [address] [place], [place] [address]   Blood Culture (routine x 2)     Status: None (Preliminary result)   Collection Time: 10/01/19  6:04 AM   Specimen: BLOOD  Result Value Ref Range Status   Specimen Description BLOOD RIGHT ARM  Final   Special Requests   Final    BOTTLES DRAWN AEROBIC AND ANAEROBIC Blood Culture adequate volume   Culture   Final    NO GROWTH 2 DAYS Performed at [facility], [address], [place], [place] [address]    Report Status PENDING  Incomplete  Blood Culture (routine x 2)     Status:  None (Preliminary result)   Collection Time: 10/01/19  6:08 AM   Specimen: BLOOD  Result Value Ref Range Status   Specimen Description BLOOD RIGHT ANTECUBITAL  Final   Special Requests   Final    BOTTLES DRAWN AEROBIC AND ANAEROBIC Blood Culture adequate volume   Culture  Final    NO GROWTH 2 DAYS Performed at [facility], [address] [place], [place] [address]    Report Status PENDING  Incomplete  MRSA PCR Screening     Status: None   Collection Time: 10/01/19  7:56 PM   Specimen: Nasal Mucosa; Nasopharyngeal  Result Value Ref Range Status   MRSA by PCR NEGATIVE NEGATIVE Final    Comment:        The GeneXpert MRSA Assay (FDA approved for NASAL specimens only), is one component of a comprehensive MRSA colonization surveillance program. It is not intended to diagnose MRSA infection nor to guide or monitor treatment for MRSA infections. Performed at [facility], [address] [place], [place] [address]      Labs: CBC: Recent Labs  Lab 10/01/19 0604 10/02/19 0841 10/03/19 0459  WBC 16.2* 5.5 5.2  NEUTROABS 13.0*  --   --   HGB 10.9* 9.2* 8.5*  HCT 32.8* 27.3* 26.4*  MCV 97.3 97.8 100.4*  PLT 291 209 189   Basic Metabolic Panel: Recent Labs  Lab 10/01/19 0604 10/02/19 0841 10/03/19 0459  NA 139 141 142  K 3.4* 4.0 3.8  CL 103 109 108  CO2 '25 26 25  ' GLUCOSE 109* 97 113*  BUN 18 10 7*  CREATININE 1.11* 0.78 0.75  CALCIUM 8.4* 7.7* 8.2*  MG  --   --  1.6*  PHOS  --   --  3.1   Liver Function Tests: Recent Labs  Lab 10/01/19 0604  AST 19  ALT 20  ALKPHOS 59  BILITOT 1.0  PROT 6.2*  ALBUMIN 3.5   Recent Labs  Lab 10/01/19 0604  LIPASE 18   No results for input(s): AMMONIA in the last 168 hours. Cardiac Enzymes: No results for input(s): CKTOTAL, CKMB, CKMBINDEX, TROPONINI in the last 168 hours. BNP (last 3 results) Recent Labs    10/01/19 0604  BNP 26.2   CBG: Recent Labs  Lab 10/02/19 0452  GLUCAP 104*     Time spent: 35 minutes  Signed:  Val Riles  [facility]  10/03/2019 1:58 PM

## 2019-10-03 NOTE — Progress Notes (Signed)
Patient alert and oriented, vss, no complaints of pain.  D/c piv and telemetry.  Called report to [facility].  No questions.  abx for 5 more days.  Escorted out of hospital via wheelchair by EMS

## 2019-10-03 NOTE — TOC Transition Note (Signed)
Transition of Care [facility]) - CM/SW Discharge Note   Patient Details  Name: Kirsten Snyder MRN: [id number] Date of Birth: January 11, 1934  [facility] CM/SW Contact:  Eileen Stanford, LCSW Phone Number: 10/03/2019, 1:34 PM   Clinical Narrative:   Clinical Social Worker facilitated patient discharge including contacting patient family and facility to confirm patient discharge plans.  Clinical information faxed to facility and family agreeable with plan.  CSW arranged ambulance transport via [facility] to [facility].  RN to call [phone] for report prior to discharge.    Final next level of care: Skilled Nursing Facility Barriers to Discharge: No Barriers Identified   Patient Goals and CMS Choice Patient states their goals for this hospitalization and ongoing recovery are:: to get stronger   Choice offered to / list presented to : Patient  Discharge Placement              Patient [facility] at[facility] Patient to be transferred to facility by: [facility] Name of family member notified: gail mayton Patient and family notified of of transfer: 10/03/19  Discharge Plan and Services In-house Referral: Clinical Social Work   Post Acute Care Choice: [place]                               Social Determinants of Health (SDOH) Interventions     Readmission Risk Interventions No flowsheet data found.

## 2019-10-05 DIAGNOSIS — I959 Hypotension, unspecified: Secondary | ICD-10-CM

## 2019-10-05 DIAGNOSIS — N390 Urinary tract infection, site not specified: Secondary | ICD-10-CM

## 2019-10-06 DIAGNOSIS — B351 Tinea unguium: Secondary | ICD-10-CM | POA: Diagnosis not present

## 2019-10-06 LAB — CULTURE, BLOOD (ROUTINE X 2)
Culture: NO GROWTH
Culture: NO GROWTH
Special Requests: ADEQUATE
Special Requests: ADEQUATE

## 2019-10-10 ENCOUNTER — Ambulatory Visit: Payer: Medicare Other | Admitting: Family Medicine

## 2019-10-13 DIAGNOSIS — D329 Benign neoplasm of meninges, unspecified: Secondary | ICD-10-CM | POA: Diagnosis not present

## 2019-10-19 ENCOUNTER — Telehealth: Payer: Self-pay | Admitting: Family Medicine

## 2019-10-19 DIAGNOSIS — M1710 Unilateral primary osteoarthritis, unspecified knee: Secondary | ICD-10-CM | POA: Diagnosis not present

## 2019-10-19 DIAGNOSIS — C700 Malignant neoplasm of cerebral meninges: Secondary | ICD-10-CM | POA: Diagnosis not present

## 2019-10-19 DIAGNOSIS — Z483 Aftercare following surgery for neoplasm: Secondary | ICD-10-CM | POA: Diagnosis not present

## 2019-10-19 DIAGNOSIS — J4520 Mild intermittent asthma, uncomplicated: Secondary | ICD-10-CM | POA: Diagnosis not present

## 2019-10-19 DIAGNOSIS — E790 Hyperuricemia without signs of inflammatory arthritis and tophaceous disease: Secondary | ICD-10-CM | POA: Diagnosis not present

## 2019-10-19 DIAGNOSIS — F3341 Major depressive disorder, recurrent, in partial remission: Secondary | ICD-10-CM | POA: Diagnosis not present

## 2019-10-19 DIAGNOSIS — E7849 Other hyperlipidemia: Secondary | ICD-10-CM | POA: Diagnosis not present

## 2019-10-19 DIAGNOSIS — I10 Essential (primary) hypertension: Secondary | ICD-10-CM | POA: Diagnosis not present

## 2019-10-19 DIAGNOSIS — J449 Chronic obstructive pulmonary disease, unspecified: Secondary | ICD-10-CM | POA: Diagnosis not present

## 2019-10-19 NOTE — Telephone Encounter (Signed)
Copied from [place] [id number]. Topic: General - Other >> Oct 19, 2019  1:49 PM Jaynie Collins D wrote: [name] from [facility] is requesting PT 1week1 and 2week3. [name] wanted to inform PCP that blood pressure was low and she was taken off Losartan and Lasix and wants to know how they should proceed.  [phone]

## 2019-10-19 NOTE — Telephone Encounter (Signed)
I spoke to [place] and then called [name] (daughter) to tell her that on the ER note it says- pt holds Losartan if top number of b/p is less than or equal to 90. Otherwise, take losartan. Also needs to start back on the Lasix as it was noted by [name], pt has 2+ pitting edema in ankles and feet

## 2019-10-20 DIAGNOSIS — E790 Hyperuricemia without signs of inflammatory arthritis and tophaceous disease: Secondary | ICD-10-CM | POA: Diagnosis not present

## 2019-10-20 DIAGNOSIS — J4520 Mild intermittent asthma, uncomplicated: Secondary | ICD-10-CM | POA: Diagnosis not present

## 2019-10-20 DIAGNOSIS — Z483 Aftercare following surgery for neoplasm: Secondary | ICD-10-CM | POA: Diagnosis not present

## 2019-10-20 DIAGNOSIS — E7849 Other hyperlipidemia: Secondary | ICD-10-CM | POA: Diagnosis not present

## 2019-10-20 DIAGNOSIS — M1710 Unilateral primary osteoarthritis, unspecified knee: Secondary | ICD-10-CM | POA: Diagnosis not present

## 2019-10-20 DIAGNOSIS — F3341 Major depressive disorder, recurrent, in partial remission: Secondary | ICD-10-CM | POA: Diagnosis not present

## 2019-10-20 DIAGNOSIS — J449 Chronic obstructive pulmonary disease, unspecified: Secondary | ICD-10-CM | POA: Diagnosis not present

## 2019-10-20 DIAGNOSIS — C700 Malignant neoplasm of cerebral meninges: Secondary | ICD-10-CM | POA: Diagnosis not present

## 2019-10-20 DIAGNOSIS — I10 Essential (primary) hypertension: Secondary | ICD-10-CM | POA: Diagnosis not present

## 2019-10-24 ENCOUNTER — Telehealth: Payer: Self-pay | Admitting: Family Medicine

## 2019-10-24 NOTE — Telephone Encounter (Signed)
Spoke to [place] and gave the "ok" on the PT and OT- [name] is on vacation, but she will have the clinician that goes out to call with how ankles look and the b/p reading

## 2019-10-24 NOTE — Telephone Encounter (Signed)
Kirsten [facility] needs OT order for 1x1 and then 2x2

## 2019-10-25 ENCOUNTER — Telehealth: Payer: Self-pay | Admitting: Family Medicine

## 2019-10-25 DIAGNOSIS — E7849 Other hyperlipidemia: Secondary | ICD-10-CM | POA: Diagnosis not present

## 2019-10-25 DIAGNOSIS — F3341 Major depressive disorder, recurrent, in partial remission: Secondary | ICD-10-CM | POA: Diagnosis not present

## 2019-10-25 DIAGNOSIS — I10 Essential (primary) hypertension: Secondary | ICD-10-CM | POA: Diagnosis not present

## 2019-10-25 DIAGNOSIS — C700 Malignant neoplasm of cerebral meninges: Secondary | ICD-10-CM | POA: Diagnosis not present

## 2019-10-25 DIAGNOSIS — J449 Chronic obstructive pulmonary disease, unspecified: Secondary | ICD-10-CM | POA: Diagnosis not present

## 2019-10-25 DIAGNOSIS — E790 Hyperuricemia without signs of inflammatory arthritis and tophaceous disease: Secondary | ICD-10-CM | POA: Diagnosis not present

## 2019-10-25 DIAGNOSIS — Z483 Aftercare following surgery for neoplasm: Secondary | ICD-10-CM | POA: Diagnosis not present

## 2019-10-25 DIAGNOSIS — J4520 Mild intermittent asthma, uncomplicated: Secondary | ICD-10-CM | POA: Diagnosis not present

## 2019-10-25 DIAGNOSIS — M1710 Unilateral primary osteoarthritis, unspecified knee: Secondary | ICD-10-CM | POA: Diagnosis not present

## 2019-10-25 NOTE — Telephone Encounter (Signed)
[name] called in to request a verbal order for Speech Therapy Kirsten Snyder [phone]

## 2019-10-25 NOTE — Telephone Encounter (Unsigned)
Copied from [place] [phone]. Topic: General - Call Back - No Documentation >> Oct 25, 2019  1:03 PM Jaynie Collins D wrote: Reason for CRM: Janelle from [place] [facility] called and wanted to give patient's BP readings 130/80,also inform PCP that patient had a fall on yesterday, also needed clarification on certain medications so [facility] [place] is requesting a call back from office with resolution.

## 2019-10-26 NOTE — Telephone Encounter (Signed)
Called Kirsten Snyder with the "ok" for speech therapy

## 2019-10-26 NOTE — Telephone Encounter (Signed)
Spoke to [place]- pts taking furosemide every other day- no pitting edema. Continue losartan daily

## 2019-10-27 ENCOUNTER — Ambulatory Visit: Payer: Self-pay | Admitting: *Deleted

## 2019-10-27 DIAGNOSIS — E7849 Other hyperlipidemia: Secondary | ICD-10-CM | POA: Diagnosis not present

## 2019-10-27 DIAGNOSIS — I10 Essential (primary) hypertension: Secondary | ICD-10-CM | POA: Diagnosis not present

## 2019-10-27 DIAGNOSIS — F3341 Major depressive disorder, recurrent, in partial remission: Secondary | ICD-10-CM | POA: Diagnosis not present

## 2019-10-27 DIAGNOSIS — J449 Chronic obstructive pulmonary disease, unspecified: Secondary | ICD-10-CM | POA: Diagnosis not present

## 2019-10-27 DIAGNOSIS — C700 Malignant neoplasm of cerebral meninges: Secondary | ICD-10-CM | POA: Diagnosis not present

## 2019-10-27 DIAGNOSIS — J4520 Mild intermittent asthma, uncomplicated: Secondary | ICD-10-CM | POA: Diagnosis not present

## 2019-10-27 DIAGNOSIS — E790 Hyperuricemia without signs of inflammatory arthritis and tophaceous disease: Secondary | ICD-10-CM | POA: Diagnosis not present

## 2019-10-27 DIAGNOSIS — Z483 Aftercare following surgery for neoplasm: Secondary | ICD-10-CM | POA: Diagnosis not present

## 2019-10-27 DIAGNOSIS — M1710 Unilateral primary osteoarthritis, unspecified knee: Secondary | ICD-10-CM | POA: Diagnosis not present

## 2019-10-27 NOTE — Telephone Encounter (Signed)
  Call received to verify prescription dose for patient . PT, Janelle noted patient home medication dose was different than medication orders. Original order for losartan 100mg  tablet daily was dose in home. Medication list shows losartan 25 mg tablet daily. 10/03/19 ordering and authorizing provider: losartan 25mg  tablet daily by Val Riles MD. Please verfiy which dose to continue. Care advise given and PT verbalized understanding of care advise.   Reason for Disposition . [1] Caller has medicine question about med NOT prescribed by PCP AND [2] triager unable to answer question (e.g., compatibility with other med, storage)  Answer Assessment - Initial Assessment Questions 1. NAME of MEDICATION: "What medicine are you calling about?"     Losartan ( cozaar) prescription 2. [place]: "What is your question?" (e.g., medication refill, side effect)     In home medication bottle shows losartan 100mg  po daily and on medication list losartan 25mg  po daily. discrepancy noted by PT Janelle. 3. PRESCRIBING HCP: "Who prescribed it?" Reason: if prescribed by specialist, call should be referred to that group.     Val Riles MD. On 10/03/19 4. SYMPTOMS: "Do you have any symptoms?"     No  5. SEVERITY: If symptoms are present, ask "Are they mild, moderate or severe?"     n/a 6. PREGNANCY:  "Is there any chance that you are pregnant?" "When was your last menstrual period?"     n/a  Protocols used: MEDICATION QUESTION CALL-A-AH

## 2019-10-27 NOTE — Telephone Encounter (Signed)
Pt is to stay on losartan 25mg - the 100mg  she was taking before surgery.

## 2019-10-30 ENCOUNTER — Ambulatory Visit: Payer: Self-pay

## 2019-10-30 DIAGNOSIS — E790 Hyperuricemia without signs of inflammatory arthritis and tophaceous disease: Secondary | ICD-10-CM | POA: Diagnosis not present

## 2019-10-30 DIAGNOSIS — I2699 Other pulmonary embolism without acute cor pulmonale: Secondary | ICD-10-CM | POA: Diagnosis not present

## 2019-10-30 DIAGNOSIS — G4700 Insomnia, unspecified: Secondary | ICD-10-CM | POA: Diagnosis not present

## 2019-10-30 DIAGNOSIS — D649 Anemia, unspecified: Secondary | ICD-10-CM | POA: Diagnosis not present

## 2019-10-30 DIAGNOSIS — E669 Obesity, unspecified: Secondary | ICD-10-CM | POA: Diagnosis not present

## 2019-10-30 DIAGNOSIS — E7849 Other hyperlipidemia: Secondary | ICD-10-CM | POA: Diagnosis not present

## 2019-10-30 DIAGNOSIS — R918 Other nonspecific abnormal finding of lung field: Secondary | ICD-10-CM | POA: Diagnosis not present

## 2019-10-30 DIAGNOSIS — C50911 Malignant neoplasm of unspecified site of right female breast: Secondary | ICD-10-CM | POA: Diagnosis not present

## 2019-10-30 DIAGNOSIS — R069 Unspecified abnormalities of breathing: Secondary | ICD-10-CM | POA: Diagnosis not present

## 2019-10-30 DIAGNOSIS — Z20822 Contact with and (suspected) exposure to covid-19: Secondary | ICD-10-CM | POA: Diagnosis not present

## 2019-10-30 DIAGNOSIS — D509 Iron deficiency anemia, unspecified: Secondary | ICD-10-CM | POA: Diagnosis not present

## 2019-10-30 DIAGNOSIS — R0902 Hypoxemia: Secondary | ICD-10-CM | POA: Diagnosis not present

## 2019-10-30 DIAGNOSIS — R110 Nausea: Secondary | ICD-10-CM | POA: Diagnosis not present

## 2019-10-30 DIAGNOSIS — I824Y2 Acute embolism and thrombosis of unspecified deep veins of left proximal lower extremity: Secondary | ICD-10-CM | POA: Diagnosis not present

## 2019-10-30 DIAGNOSIS — I10 Essential (primary) hypertension: Secondary | ICD-10-CM | POA: Diagnosis not present

## 2019-10-30 DIAGNOSIS — Z483 Aftercare following surgery for neoplasm: Secondary | ICD-10-CM | POA: Diagnosis not present

## 2019-10-30 DIAGNOSIS — R8271 Bacteriuria: Secondary | ICD-10-CM | POA: Diagnosis not present

## 2019-10-30 DIAGNOSIS — F3341 Major depressive disorder, recurrent, in partial remission: Secondary | ICD-10-CM | POA: Diagnosis not present

## 2019-10-30 DIAGNOSIS — J449 Chronic obstructive pulmonary disease, unspecified: Secondary | ICD-10-CM | POA: Diagnosis not present

## 2019-10-30 DIAGNOSIS — T81718A Complication of other artery following a procedure, not elsewhere classified, initial encounter: Secondary | ICD-10-CM | POA: Diagnosis not present

## 2019-10-30 DIAGNOSIS — C700 Malignant neoplasm of cerebral meninges: Secondary | ICD-10-CM | POA: Diagnosis not present

## 2019-10-30 DIAGNOSIS — I959 Hypotension, unspecified: Secondary | ICD-10-CM | POA: Diagnosis not present

## 2019-10-30 DIAGNOSIS — J4520 Mild intermittent asthma, uncomplicated: Secondary | ICD-10-CM | POA: Diagnosis not present

## 2019-10-30 DIAGNOSIS — R0602 Shortness of breath: Secondary | ICD-10-CM | POA: Diagnosis not present

## 2019-10-30 DIAGNOSIS — M1710 Unilateral primary osteoarthritis, unspecified knee: Secondary | ICD-10-CM | POA: Diagnosis not present

## 2019-10-30 DIAGNOSIS — E785 Hyperlipidemia, unspecified: Secondary | ICD-10-CM | POA: Diagnosis not present

## 2019-10-30 NOTE — Telephone Encounter (Signed)
Spoke to Gail/ daughter- 911 was called and pt has been transported per [name]

## 2019-10-30 NOTE — Telephone Encounter (Signed)
O2 82%  HR 103 Temp 98.4 BP 105/50 Feels "terrible"- weakness feel drained that  started this am when she awoke  H/o mass removed right frontal mass (hemangioma) on 09/19/19 at [facility] H/o HTN took BP med- in June dose was decreased from 100 mg to 25 mg.  Per [place] OT Sherlynn Stalls)- Pt's O2 sat staying at 82%- does go up to 90% with coughing and deep breathing but quickly goes down to the low 80's.  Pt not SOB or having difficulty breathing per Sherlynn Stalls.  No difficulty with speech or pain.  advised Sherlynn Stalls to call 911 fr immediate evaluation of hypoxia and low BP and weakness. Pt agreeable.   Reason for Disposition . Major surgery in the past month  Answer Assessment - Initial Assessment Questions 1. BLOOD PRESSURE: "What is the blood pressure?" "Did you take at least two measurements 5 minutes apart?"     105/50 2. ONSET: "When did you take your blood pressure?"     Just before call 4. HISTORY: "Do you have a history of low blood pressure?" "What is your blood pressure normally?"     No h/i HTN 5. MEDICATIONS: "Are you taking any medications for blood pressure?" If Yes, - in June dose was decreased from 100 mg to 25 mg.     Yes  6. PULSE RATE: "Do you know what your pulse rate is?"      103 7. OTHER SYMPTOMS: "Have you been sick recently?" "Have you had a recent injury?"     Recent h/o right frontal mass removed form brain- pt stated she woke up this am a "feels completely drained" 8. PREGNANCY: "Is there any chance you are pregnant?" "When was your last menstrual period?"     No/n/a  Protocols used: BLOOD PRESSURE - LOW-A-AH

## 2019-10-31 DIAGNOSIS — Z9889 Other specified postprocedural states: Secondary | ICD-10-CM | POA: Insufficient documentation

## 2019-10-31 DIAGNOSIS — I2699 Other pulmonary embolism without acute cor pulmonale: Secondary | ICD-10-CM | POA: Insufficient documentation

## 2019-10-31 DIAGNOSIS — Z86018 Personal history of other benign neoplasm: Secondary | ICD-10-CM | POA: Insufficient documentation

## 2019-11-01 ENCOUNTER — Ambulatory Visit: Payer: PRIVATE HEALTH INSURANCE | Admitting: Family Medicine

## 2019-11-03 ENCOUNTER — Telehealth: Payer: Self-pay | Admitting: Family Medicine

## 2019-11-03 ENCOUNTER — Other Ambulatory Visit: Payer: Self-pay

## 2019-11-03 DIAGNOSIS — M1710 Unilateral primary osteoarthritis, unspecified knee: Secondary | ICD-10-CM | POA: Diagnosis not present

## 2019-11-03 DIAGNOSIS — C700 Malignant neoplasm of cerebral meninges: Secondary | ICD-10-CM | POA: Diagnosis not present

## 2019-11-03 DIAGNOSIS — E7849 Other hyperlipidemia: Secondary | ICD-10-CM | POA: Diagnosis not present

## 2019-11-03 DIAGNOSIS — J4520 Mild intermittent asthma, uncomplicated: Secondary | ICD-10-CM | POA: Diagnosis not present

## 2019-11-03 DIAGNOSIS — E790 Hyperuricemia without signs of inflammatory arthritis and tophaceous disease: Secondary | ICD-10-CM | POA: Diagnosis not present

## 2019-11-03 DIAGNOSIS — J449 Chronic obstructive pulmonary disease, unspecified: Secondary | ICD-10-CM | POA: Diagnosis not present

## 2019-11-03 DIAGNOSIS — I10 Essential (primary) hypertension: Secondary | ICD-10-CM | POA: Diagnosis not present

## 2019-11-03 DIAGNOSIS — Z483 Aftercare following surgery for neoplasm: Secondary | ICD-10-CM | POA: Diagnosis not present

## 2019-11-03 DIAGNOSIS — F3341 Major depressive disorder, recurrent, in partial remission: Secondary | ICD-10-CM | POA: Diagnosis not present

## 2019-11-03 NOTE — Telephone Encounter (Signed)
Spoke to [place]- gave the ok for PT

## 2019-11-03 NOTE — Telephone Encounter (Signed)
[name] PT [facility] is calling and would like orders to resuming PT for this patient 1x1 and 2x3. Pt was in   [facility] hospital from 7/12 until 7/14 with blood clot in her lung

## 2019-11-05 DIAGNOSIS — C700 Malignant neoplasm of cerebral meninges: Secondary | ICD-10-CM | POA: Diagnosis not present

## 2019-11-05 DIAGNOSIS — M1710 Unilateral primary osteoarthritis, unspecified knee: Secondary | ICD-10-CM | POA: Diagnosis not present

## 2019-11-05 DIAGNOSIS — I10 Essential (primary) hypertension: Secondary | ICD-10-CM | POA: Diagnosis not present

## 2019-11-05 DIAGNOSIS — Z483 Aftercare following surgery for neoplasm: Secondary | ICD-10-CM | POA: Diagnosis not present

## 2019-11-05 DIAGNOSIS — J449 Chronic obstructive pulmonary disease, unspecified: Secondary | ICD-10-CM | POA: Diagnosis not present

## 2019-11-05 DIAGNOSIS — F3341 Major depressive disorder, recurrent, in partial remission: Secondary | ICD-10-CM | POA: Diagnosis not present

## 2019-11-05 DIAGNOSIS — J4520 Mild intermittent asthma, uncomplicated: Secondary | ICD-10-CM | POA: Diagnosis not present

## 2019-11-05 DIAGNOSIS — E790 Hyperuricemia without signs of inflammatory arthritis and tophaceous disease: Secondary | ICD-10-CM | POA: Diagnosis not present

## 2019-11-05 DIAGNOSIS — E7849 Other hyperlipidemia: Secondary | ICD-10-CM | POA: Diagnosis not present

## 2019-11-06 ENCOUNTER — Telehealth: Payer: Self-pay | Admitting: Family Medicine

## 2019-11-06 NOTE — Telephone Encounter (Signed)
Gave the okay for home health to continue services- spoke to [place]

## 2019-11-06 NOTE — Telephone Encounter (Signed)
Lori with [facility] called in to request verbal orders for home health services.   Frequency: 2 week 1; 1 week 3; 3 PRN  CB: [phone]- okay to lvm.

## 2019-11-07 DIAGNOSIS — E790 Hyperuricemia without signs of inflammatory arthritis and tophaceous disease: Secondary | ICD-10-CM | POA: Diagnosis not present

## 2019-11-07 DIAGNOSIS — M1710 Unilateral primary osteoarthritis, unspecified knee: Secondary | ICD-10-CM | POA: Diagnosis not present

## 2019-11-07 DIAGNOSIS — I10 Essential (primary) hypertension: Secondary | ICD-10-CM | POA: Diagnosis not present

## 2019-11-07 DIAGNOSIS — F3341 Major depressive disorder, recurrent, in partial remission: Secondary | ICD-10-CM | POA: Diagnosis not present

## 2019-11-07 DIAGNOSIS — J449 Chronic obstructive pulmonary disease, unspecified: Secondary | ICD-10-CM | POA: Diagnosis not present

## 2019-11-07 DIAGNOSIS — C700 Malignant neoplasm of cerebral meninges: Secondary | ICD-10-CM | POA: Diagnosis not present

## 2019-11-07 DIAGNOSIS — E7849 Other hyperlipidemia: Secondary | ICD-10-CM | POA: Diagnosis not present

## 2019-11-07 DIAGNOSIS — Z483 Aftercare following surgery for neoplasm: Secondary | ICD-10-CM | POA: Diagnosis not present

## 2019-11-07 DIAGNOSIS — J4520 Mild intermittent asthma, uncomplicated: Secondary | ICD-10-CM | POA: Diagnosis not present

## 2019-11-08 ENCOUNTER — Telehealth: Payer: Self-pay | Admitting: Family Medicine

## 2019-11-08 DIAGNOSIS — E790 Hyperuricemia without signs of inflammatory arthritis and tophaceous disease: Secondary | ICD-10-CM | POA: Diagnosis not present

## 2019-11-08 DIAGNOSIS — Z483 Aftercare following surgery for neoplasm: Secondary | ICD-10-CM | POA: Diagnosis not present

## 2019-11-08 DIAGNOSIS — F3341 Major depressive disorder, recurrent, in partial remission: Secondary | ICD-10-CM | POA: Diagnosis not present

## 2019-11-08 DIAGNOSIS — I10 Essential (primary) hypertension: Secondary | ICD-10-CM | POA: Diagnosis not present

## 2019-11-08 DIAGNOSIS — E7849 Other hyperlipidemia: Secondary | ICD-10-CM | POA: Diagnosis not present

## 2019-11-08 DIAGNOSIS — C700 Malignant neoplasm of cerebral meninges: Secondary | ICD-10-CM | POA: Diagnosis not present

## 2019-11-08 DIAGNOSIS — J4520 Mild intermittent asthma, uncomplicated: Secondary | ICD-10-CM | POA: Diagnosis not present

## 2019-11-08 DIAGNOSIS — J449 Chronic obstructive pulmonary disease, unspecified: Secondary | ICD-10-CM | POA: Diagnosis not present

## 2019-11-08 DIAGNOSIS — M1710 Unilateral primary osteoarthritis, unspecified knee: Secondary | ICD-10-CM | POA: Diagnosis not present

## 2019-11-08 NOTE — Telephone Encounter (Signed)
Home Health Verbal Orders - Caller/Agency: [facility]// [facility] Callback Number: [phone] secure line  Requesting OT/PT/Skilled Nursing/Social Work/Speech Therapy: OT Frequency: 2x for 2wks

## 2019-11-09 DIAGNOSIS — F3341 Major depressive disorder, recurrent, in partial remission: Secondary | ICD-10-CM | POA: Diagnosis not present

## 2019-11-09 DIAGNOSIS — J4520 Mild intermittent asthma, uncomplicated: Secondary | ICD-10-CM | POA: Diagnosis not present

## 2019-11-09 DIAGNOSIS — C700 Malignant neoplasm of cerebral meninges: Secondary | ICD-10-CM | POA: Diagnosis not present

## 2019-11-09 DIAGNOSIS — M1710 Unilateral primary osteoarthritis, unspecified knee: Secondary | ICD-10-CM | POA: Diagnosis not present

## 2019-11-09 DIAGNOSIS — E7849 Other hyperlipidemia: Secondary | ICD-10-CM | POA: Diagnosis not present

## 2019-11-09 DIAGNOSIS — Z483 Aftercare following surgery for neoplasm: Secondary | ICD-10-CM | POA: Diagnosis not present

## 2019-11-09 DIAGNOSIS — E790 Hyperuricemia without signs of inflammatory arthritis and tophaceous disease: Secondary | ICD-10-CM | POA: Diagnosis not present

## 2019-11-09 DIAGNOSIS — J449 Chronic obstructive pulmonary disease, unspecified: Secondary | ICD-10-CM | POA: Diagnosis not present

## 2019-11-09 DIAGNOSIS — I10 Essential (primary) hypertension: Secondary | ICD-10-CM | POA: Diagnosis not present

## 2019-11-09 NOTE — Telephone Encounter (Signed)
Kirsten Snyder spoke to Kirsten Snyder from [facility] and gave the "ok"

## 2019-11-10 DIAGNOSIS — M1710 Unilateral primary osteoarthritis, unspecified knee: Secondary | ICD-10-CM | POA: Diagnosis not present

## 2019-11-10 DIAGNOSIS — F3341 Major depressive disorder, recurrent, in partial remission: Secondary | ICD-10-CM | POA: Diagnosis not present

## 2019-11-10 DIAGNOSIS — E7849 Other hyperlipidemia: Secondary | ICD-10-CM | POA: Diagnosis not present

## 2019-11-10 DIAGNOSIS — C700 Malignant neoplasm of cerebral meninges: Secondary | ICD-10-CM | POA: Diagnosis not present

## 2019-11-10 DIAGNOSIS — J449 Chronic obstructive pulmonary disease, unspecified: Secondary | ICD-10-CM | POA: Diagnosis not present

## 2019-11-10 DIAGNOSIS — I10 Essential (primary) hypertension: Secondary | ICD-10-CM | POA: Diagnosis not present

## 2019-11-10 DIAGNOSIS — E790 Hyperuricemia without signs of inflammatory arthritis and tophaceous disease: Secondary | ICD-10-CM | POA: Diagnosis not present

## 2019-11-10 DIAGNOSIS — J4520 Mild intermittent asthma, uncomplicated: Secondary | ICD-10-CM | POA: Diagnosis not present

## 2019-11-10 DIAGNOSIS — Z483 Aftercare following surgery for neoplasm: Secondary | ICD-10-CM | POA: Diagnosis not present

## 2019-11-13 ENCOUNTER — Other Ambulatory Visit: Payer: Self-pay | Admitting: Family Medicine

## 2019-11-13 DIAGNOSIS — Z483 Aftercare following surgery for neoplasm: Secondary | ICD-10-CM | POA: Diagnosis not present

## 2019-11-13 DIAGNOSIS — J4520 Mild intermittent asthma, uncomplicated: Secondary | ICD-10-CM | POA: Diagnosis not present

## 2019-11-13 DIAGNOSIS — M1710 Unilateral primary osteoarthritis, unspecified knee: Secondary | ICD-10-CM | POA: Diagnosis not present

## 2019-11-13 DIAGNOSIS — F5101 Primary insomnia: Secondary | ICD-10-CM

## 2019-11-13 DIAGNOSIS — E7849 Other hyperlipidemia: Secondary | ICD-10-CM | POA: Diagnosis not present

## 2019-11-13 DIAGNOSIS — C700 Malignant neoplasm of cerebral meninges: Secondary | ICD-10-CM | POA: Diagnosis not present

## 2019-11-13 DIAGNOSIS — I10 Essential (primary) hypertension: Secondary | ICD-10-CM | POA: Diagnosis not present

## 2019-11-13 DIAGNOSIS — F3341 Major depressive disorder, recurrent, in partial remission: Secondary | ICD-10-CM | POA: Diagnosis not present

## 2019-11-13 DIAGNOSIS — E790 Hyperuricemia without signs of inflammatory arthritis and tophaceous disease: Secondary | ICD-10-CM | POA: Diagnosis not present

## 2019-11-13 DIAGNOSIS — J449 Chronic obstructive pulmonary disease, unspecified: Secondary | ICD-10-CM | POA: Diagnosis not present

## 2019-11-14 ENCOUNTER — Ambulatory Visit (INDEPENDENT_AMBULATORY_CARE_PROVIDER_SITE_OTHER): Payer: PRIVATE HEALTH INSURANCE | Admitting: Family Medicine

## 2019-11-14 ENCOUNTER — Encounter: Payer: Self-pay | Admitting: Family Medicine

## 2019-11-14 ENCOUNTER — Other Ambulatory Visit: Payer: Self-pay

## 2019-11-14 VITALS — BP 136/78 | HR 82 | Ht 64.0 in | Wt 225.0 lb

## 2019-11-14 DIAGNOSIS — E876 Hypokalemia: Secondary | ICD-10-CM

## 2019-11-14 DIAGNOSIS — D509 Iron deficiency anemia, unspecified: Secondary | ICD-10-CM

## 2019-11-14 DIAGNOSIS — I10 Essential (primary) hypertension: Secondary | ICD-10-CM | POA: Diagnosis not present

## 2019-11-14 DIAGNOSIS — Z09 Encounter for follow-up examination after completed treatment for conditions other than malignant neoplasm: Secondary | ICD-10-CM | POA: Diagnosis not present

## 2019-11-14 DIAGNOSIS — I2699 Other pulmonary embolism without acute cor pulmonale: Secondary | ICD-10-CM

## 2019-11-14 MED ORDER — APIXABAN 5 MG PO TABS: 5.0000 mg | ORAL_TABLET | Freq: Two times a day (BID) | ORAL | 2 refills | Status: DC

## 2019-11-14 NOTE — Progress Notes (Signed)
Date:  11/14/2019   Name:  Kirsten Snyder   DOB:  1933-10-05   MRN:  [id number]   Chief Complaint: Hospitalization Follow-up  Patient is a 86 year old female who presents for a hospital follow up  exam. The patient reports the following problems: none. Health maintenance has been reviewed up to date.   Lab Results  Component Value Date   CREATININE 0.75 10/03/2019   BUN 7 (L) 10/03/2019   NA 142 10/03/2019   K 3.8 10/03/2019   CL 108 10/03/2019   CO2 25 10/03/2019   Lab Results  Component Value Date   CHOL 227 (H) 04/11/2019   HDL 40 04/11/2019   LDLCALC 140 (H) 04/11/2019   TRIG 261 (H) 04/11/2019   CHOLHDL 4.8 (H) 11/02/2017   Lab Results  Component Value Date   TSH 2.39 09/15/2012   No results found for: HGBA1C Lab Results  Component Value Date   WBC 5.2 10/03/2019   HGB 8.5 (L) 10/03/2019   HCT 26.4 (L) 10/03/2019   MCV 100.4 (H) 10/03/2019   PLT 208 10/03/2019   Lab Results  Component Value Date   ALT 20 10/01/2019   AST 19 10/01/2019   ALKPHOS 59 10/01/2019   BILITOT 1.0 10/01/2019     Review of Systems  Constitutional: Negative.  Negative for chills, fatigue, fever and unexpected weight change.  HENT: Negative for congestion, ear discharge, ear pain, rhinorrhea, sinus pressure, sneezing and sore throat.   Eyes: Negative for photophobia, pain, discharge, redness and itching.  Respiratory: Negative for cough, shortness of breath, wheezing and stridor.   Gastrointestinal: Negative for abdominal pain, blood in stool, constipation, diarrhea, nausea and vomiting.  Endocrine: Negative for cold intolerance, heat intolerance, polydipsia, polyphagia and polyuria.  Genitourinary: Negative for dysuria, flank pain, frequency, hematuria, menstrual problem, pelvic pain, urgency, vaginal bleeding and vaginal discharge.  Musculoskeletal: Negative for arthralgias, back pain and myalgias.  Skin: Negative for rash.  Allergic/Immunologic: Negative for environmental  allergies and food allergies.  Neurological: Negative for dizziness, weakness, light-headedness, numbness and headaches.  Hematological: Negative for adenopathy. Does not bruise/bleed easily.  Psychiatric/Behavioral: Negative for dysphoric mood. The patient is not nervous/anxious.     Patient Active Problem List   Diagnosis Date Noted  . Sepsis secondary to UTI ([place]) 10/01/2019  . Sepsis ([place]) 10/01/2019  . Hypokalemia 10/01/2019  . Atherosclerosis of abdominal aorta ([place]) 10/12/2018  . SOBOE (shortness of breath on exertion) 10/12/2018  . Osteopenia of neck of right femur 06/29/2018  . Breast cancer, right ([place]) 06/17/2016  . Essential hypertension 12/13/2015  . Breast abscess of female 11/07/2015  . Familial multiple lipoprotein-type hyperlipidemia 10/25/2014  . Clinical depression 10/25/2014  . Acid reflux 10/25/2014  . Acute gastrointestinal bleeding 10/25/2014  . Aggrieved 10/25/2014  . Airway hyperreactivity 10/25/2014  . Arthritis of knee 10/25/2014  . H/O: gout 10/25/2014  . Adiposity 10/25/2014  . Arthritis, degenerative 10/25/2014  . H/O adenomatous polyp of colon 03/07/2014    Allergies  Allergen Reactions  . Sulfa Antibiotics     Past Surgical History:  Procedure Laterality Date  . BREAST BIOPSY Bilateral    negative  . BREAST EXCISIONAL BIOPSY Right    right positive 04/2010  . BREAST LUMPECTOMY Right    removed a cancerous lump  . VAGINAL HYSTERECTOMY      Social History   Tobacco Use  . Smoking status: Former Smoker    Packs/day: 0.25    Years: 2.00  Pack years: 0.50    Types: Cigarettes  . Smokeless tobacco: Never Used  . Tobacco comment: smoking cessation materials not required  Vaping Use  . Vaping Use: Never used  Substance Use Topics  . Alcohol use: No    Alcohol/week: 0.0 standard drinks  . Drug use: No     Medication list has been reviewed and updated.  Current Meds  Medication Sig  . albuterol (VENTOLIN HFA) 108 (90 Base)  MCG/ACT inhaler Inhale 2 puffs into the lungs every 6 (six) hours as needed for wheezing or shortness of breath.  . allopurinol (ZYLOPRIM) 100 MG tablet 1 tablet by mouth daily (Patient taking differently: Take 100 mg by mouth at bedtime. For gout)  . apixaban (ELIQUIS) 5 MG TABS tablet Take 5 mg by mouth 2 (two) times daily.   . calcium carbonate (TUMS - DOSED IN MG ELEMENTAL CALCIUM) 500 MG chewable tablet Chew 1 tablet by mouth every 8 (eight) hours as needed for indigestion.  . cetirizine (ZYRTEC) 10 MG tablet Take 10 mg by mouth daily. For allergies.  . ferrous sulfate 325 (65 FE) MG tablet Take 325 mg by mouth daily with breakfast.  . Fluticasone-Umeclidin-Vilant (TRELEGY ELLIPTA) 100-62.5-25 MCG/INH AEPB Inhale 1 puff into the lungs daily.  Marland Kitchen lovastatin (MEVACOR) 40 MG tablet TAKE (1) TABLET BY MOUTH DAILY AT BEDTIME  . omeprazole (PRILOSEC) 20 MG capsule TAKE ONE (1) CAPSULE EACH DAY.  . sertraline (ZOLOFT) 50 MG tablet TAKE (1) TABLET BY MOUTH EVERY DAY  . traZODone (DESYREL) 50 MG tablet TAKE (1) TABLET BY MOUTH DAILY AT BEDTIME    PHQ 2/9 Scores 11/14/2019 09/14/2019 05/10/2019 04/11/2019  PHQ - 2 Score 0 0 0 0  PHQ- 9 Score 0 2 - 0    GAD 7 : Generalized Anxiety Score 11/14/2019 09/14/2019 04/11/2019 08/24/2018  Nervous, Anxious, on Edge 0 1 0 3  Control/stop worrying 0 1 0 2  Worry too much - different things 0 1 0 2  Trouble relaxing 0 1 0 3  Restless 0 0 0 0  Easily annoyed or irritable 0 0 0 1  Afraid - awful might happen 0 0 0 2  Total GAD 7 Score 0 4 0 13  Anxiety Difficulty Not difficult at all Not difficult at all - Somewhat difficult    BP Readings from Last 3 Encounters:  11/14/19 (!) 136/78  10/03/19 118/65  05/10/19 108/68    Physical Exam Vitals and nursing note reviewed.  Constitutional:      Appearance: She is well-developed.  HENT:     Head: Normocephalic.     Right Ear: Tympanic membrane, ear canal and external ear normal.     Left Ear: Tympanic  membrane, ear canal and external ear normal.  Eyes:     General: Lids are everted, no foreign bodies appreciated. No scleral icterus.       Left eye: No foreign body or hordeolum.     Conjunctiva/sclera: Conjunctivae normal.     Right eye: Right conjunctiva is not injected.     Left eye: Left conjunctiva is not injected.     Pupils: Pupils are equal, round, and reactive to light.  Neck:     Thyroid: No thyromegaly.     Vascular: No JVD.     Trachea: No tracheal deviation.  Cardiovascular:     Rate and Rhythm: Normal rate and regular rhythm.     Heart sounds: Normal heart sounds. No murmur heard.  No friction rub. No gallop.  Pulmonary:     Effort: Pulmonary effort is normal. No respiratory distress.     Breath sounds: Normal breath sounds. No wheezing, rhonchi or rales.  Abdominal:     General: Bowel sounds are normal.     Palpations: Abdomen is soft. There is no mass.     Tenderness: There is no abdominal tenderness. There is no guarding or rebound.  Musculoskeletal:        General: No tenderness. Normal range of motion.     Cervical back: Normal range of motion and neck supple.  Lymphadenopathy:     Cervical: No cervical adenopathy.  Skin:    General: Skin is warm.     Findings: No rash.  Neurological:     Mental Status: She is alert and oriented to person, place, and time.     Cranial Nerves: No cranial nerve deficit.     Deep Tendon Reflexes: Reflexes normal.  Psychiatric:        Mood and Affect: Mood is not anxious or depressed.     Wt Readings from Last 3 Encounters:  11/14/19 (!) 225 lb (102.1 kg)  10/03/19 234 lb 14.4 oz (106.5 kg)  05/10/19 236 lb (107 kg)    BP (!) 136/78   Pulse 82   Ht 5\' 4"  (1.626 m)   Wt (!) 225 lb (102.1 kg)   SpO2 96%   BMI 38.62 kg/m   Assessment and Plan:  1. Hospital discharge follow-up Patient with hospital discharge follow-up as recently for pulmonary embolus secondary to relative inactivity due to surgery for removal of  a meningioma.  Patient is doing excellent relative to previous visits.  Improved with shortness of breath is no longer requiring oxygen.  And is off losartan and hydrochlorothiazide and blood pressure is controlled as noted below.  2. Acute pulmonary embolism without acute cor pulmonale, unspecified pulmonary embolism type ([place]) Patient will be continued on Eliquis 5 mg twice a day for at least a 73-month.  And possibly extended to 6 months.  We will recheck patient in 6 to 8 weeks. - apixaban (ELIQUIS) 5 MG TABS tablet; Take 1 tablet (5 mg total) by mouth 2 (two) times daily.  Dispense: 60 tablet; Refill: 2  3. Iron deficiency anemia, unspecified iron deficiency anemia type Patient was noted to have iron deficiency anemia for which she was put on iron and she has continued we will get a CBC today to make sure that she is not progressively decreasing given that she is on Eliquis and she has a history of polyps. - CBC with Differential/Platelet  4. Essential hypertension Patient has recently been discontinued on losartan and hydrochlorothiazide due to the pulmonary emboli.  She is doing well off the medications with blood pressure in the 136/78 range.  5. Hypokalemia Patient with history of hypokalemia and we will check a renal function panel to assess electrolytes. - Renal Function Panel

## 2019-11-15 ENCOUNTER — Other Ambulatory Visit
Admission: RE | Admit: 2019-11-15 | Discharge: 2019-11-15 | Disposition: A | Discharge status: Home / Self Care | Payer: Medicare HMO | Attending: Family Medicine | Admitting: Family Medicine

## 2019-11-15 DIAGNOSIS — E876 Hypokalemia: Secondary | ICD-10-CM | POA: Diagnosis not present

## 2019-11-15 DIAGNOSIS — J449 Chronic obstructive pulmonary disease, unspecified: Secondary | ICD-10-CM | POA: Diagnosis not present

## 2019-11-15 DIAGNOSIS — E7849 Other hyperlipidemia: Secondary | ICD-10-CM | POA: Diagnosis not present

## 2019-11-15 DIAGNOSIS — I10 Essential (primary) hypertension: Secondary | ICD-10-CM | POA: Diagnosis not present

## 2019-11-15 DIAGNOSIS — C700 Malignant neoplasm of cerebral meninges: Secondary | ICD-10-CM | POA: Diagnosis not present

## 2019-11-15 DIAGNOSIS — D509 Iron deficiency anemia, unspecified: Secondary | ICD-10-CM | POA: Diagnosis not present

## 2019-11-15 DIAGNOSIS — E790 Hyperuricemia without signs of inflammatory arthritis and tophaceous disease: Secondary | ICD-10-CM | POA: Diagnosis not present

## 2019-11-15 DIAGNOSIS — J4520 Mild intermittent asthma, uncomplicated: Secondary | ICD-10-CM | POA: Diagnosis not present

## 2019-11-15 DIAGNOSIS — M1710 Unilateral primary osteoarthritis, unspecified knee: Secondary | ICD-10-CM | POA: Diagnosis not present

## 2019-11-15 DIAGNOSIS — Z483 Aftercare following surgery for neoplasm: Secondary | ICD-10-CM | POA: Diagnosis not present

## 2019-11-15 DIAGNOSIS — F3341 Major depressive disorder, recurrent, in partial remission: Secondary | ICD-10-CM | POA: Diagnosis not present

## 2019-11-15 LAB — CBC WITH DIFFERENTIAL/PLATELET
Abs Immature Granulocytes: 0.01 10*3/uL (ref 0.00–0.07)
Basophils Absolute: 0.1 10*3/uL (ref 0.0–0.1)
Basophils Relative: 1 %
Eosinophils Absolute: 0.4 10*3/uL (ref 0.0–0.5)
Eosinophils Relative: 7 %
HCT: 38.7 % (ref 36.0–46.0)
Hemoglobin: 12 g/dL (ref 12.0–15.0)
Immature Granulocytes: 0 %
Lymphocytes Relative: 33 %
Lymphs Abs: 1.8 10*3/uL (ref 0.7–4.0)
MCH: 30.2 pg (ref 26.0–34.0)
MCHC: 31 g/dL (ref 30.0–36.0)
MCV: 97.5 fL (ref 80.0–100.0)
Monocytes Absolute: 0.5 10*3/uL (ref 0.1–1.0)
Monocytes Relative: 8 %
Neutro Abs: 2.8 10*3/uL (ref 1.7–7.7)
Neutrophils Relative %: 51 %
Platelets: 382 10*3/uL (ref 150–400)
RBC: 3.97 MIL/uL (ref 3.87–5.11)
RDW: 14.4 % (ref 11.5–15.5)
WBC: 5.6 10*3/uL (ref 4.0–10.5)
nRBC: 0 % (ref 0.0–0.2)

## 2019-11-15 LAB — RENAL FUNCTION PANEL
Albumin: 3.8 g/dL (ref 3.5–5.0)
Anion gap: 8 (ref 5–15)
BUN: 12 mg/dL (ref 8–23)
CO2: 29 mmol/L (ref 22–32)
Calcium: 9.1 mg/dL (ref 8.9–10.3)
Chloride: 103 mmol/L (ref 98–111)
Creatinine, Ser: 0.82 mg/dL (ref 0.44–1.00)
GFR calc Af Amer: >60 mL/min (ref >60)
GFR calc non Af Amer: >60 mL/min (ref >60)
Glucose, Bld: 138 mg/dL — ABNORMAL HIGH (ref 70–99)
Phosphorus: 3.1 mg/dL (ref 2.5–4.6)
Potassium: 4.4 mmol/L (ref 3.5–5.1)
Sodium: 140 mmol/L (ref 135–145)

## 2019-11-16 DIAGNOSIS — I10 Essential (primary) hypertension: Secondary | ICD-10-CM | POA: Diagnosis not present

## 2019-11-16 DIAGNOSIS — E7849 Other hyperlipidemia: Secondary | ICD-10-CM | POA: Diagnosis not present

## 2019-11-16 DIAGNOSIS — F3341 Major depressive disorder, recurrent, in partial remission: Secondary | ICD-10-CM | POA: Diagnosis not present

## 2019-11-16 DIAGNOSIS — M1710 Unilateral primary osteoarthritis, unspecified knee: Secondary | ICD-10-CM | POA: Diagnosis not present

## 2019-11-16 DIAGNOSIS — C700 Malignant neoplasm of cerebral meninges: Secondary | ICD-10-CM | POA: Diagnosis not present

## 2019-11-16 DIAGNOSIS — J449 Chronic obstructive pulmonary disease, unspecified: Secondary | ICD-10-CM | POA: Diagnosis not present

## 2019-11-16 DIAGNOSIS — Z483 Aftercare following surgery for neoplasm: Secondary | ICD-10-CM | POA: Diagnosis not present

## 2019-11-16 DIAGNOSIS — J4520 Mild intermittent asthma, uncomplicated: Secondary | ICD-10-CM | POA: Diagnosis not present

## 2019-11-16 DIAGNOSIS — E790 Hyperuricemia without signs of inflammatory arthritis and tophaceous disease: Secondary | ICD-10-CM | POA: Diagnosis not present

## 2019-11-17 DIAGNOSIS — C700 Malignant neoplasm of cerebral meninges: Secondary | ICD-10-CM | POA: Diagnosis not present

## 2019-11-17 DIAGNOSIS — F3341 Major depressive disorder, recurrent, in partial remission: Secondary | ICD-10-CM | POA: Diagnosis not present

## 2019-11-17 DIAGNOSIS — E7849 Other hyperlipidemia: Secondary | ICD-10-CM | POA: Diagnosis not present

## 2019-11-17 DIAGNOSIS — Z483 Aftercare following surgery for neoplasm: Secondary | ICD-10-CM | POA: Diagnosis not present

## 2019-11-17 DIAGNOSIS — J449 Chronic obstructive pulmonary disease, unspecified: Secondary | ICD-10-CM | POA: Diagnosis not present

## 2019-11-17 DIAGNOSIS — J4520 Mild intermittent asthma, uncomplicated: Secondary | ICD-10-CM | POA: Diagnosis not present

## 2019-11-17 DIAGNOSIS — M1710 Unilateral primary osteoarthritis, unspecified knee: Secondary | ICD-10-CM | POA: Diagnosis not present

## 2019-11-17 DIAGNOSIS — E790 Hyperuricemia without signs of inflammatory arthritis and tophaceous disease: Secondary | ICD-10-CM | POA: Diagnosis not present

## 2019-11-17 DIAGNOSIS — I10 Essential (primary) hypertension: Secondary | ICD-10-CM | POA: Diagnosis not present

## 2019-11-20 DIAGNOSIS — E7849 Other hyperlipidemia: Secondary | ICD-10-CM | POA: Diagnosis not present

## 2019-11-20 DIAGNOSIS — J4520 Mild intermittent asthma, uncomplicated: Secondary | ICD-10-CM | POA: Diagnosis not present

## 2019-11-20 DIAGNOSIS — E790 Hyperuricemia without signs of inflammatory arthritis and tophaceous disease: Secondary | ICD-10-CM | POA: Diagnosis not present

## 2019-11-20 DIAGNOSIS — J449 Chronic obstructive pulmonary disease, unspecified: Secondary | ICD-10-CM | POA: Diagnosis not present

## 2019-11-20 DIAGNOSIS — C700 Malignant neoplasm of cerebral meninges: Secondary | ICD-10-CM | POA: Diagnosis not present

## 2019-11-20 DIAGNOSIS — Z483 Aftercare following surgery for neoplasm: Secondary | ICD-10-CM | POA: Diagnosis not present

## 2019-11-20 DIAGNOSIS — I10 Essential (primary) hypertension: Secondary | ICD-10-CM | POA: Diagnosis not present

## 2019-11-20 DIAGNOSIS — M1710 Unilateral primary osteoarthritis, unspecified knee: Secondary | ICD-10-CM | POA: Diagnosis not present

## 2019-11-20 DIAGNOSIS — F3341 Major depressive disorder, recurrent, in partial remission: Secondary | ICD-10-CM | POA: Diagnosis not present

## 2019-11-22 ENCOUNTER — Other Ambulatory Visit (INDEPENDENT_AMBULATORY_CARE_PROVIDER_SITE_OTHER): Payer: PRIVATE HEALTH INSURANCE

## 2019-11-22 ENCOUNTER — Other Ambulatory Visit: Payer: Self-pay

## 2019-11-22 ENCOUNTER — Telehealth: Payer: Self-pay | Admitting: Family Medicine

## 2019-11-22 ENCOUNTER — Telehealth: Payer: Self-pay

## 2019-11-22 DIAGNOSIS — C700 Malignant neoplasm of cerebral meninges: Secondary | ICD-10-CM | POA: Diagnosis not present

## 2019-11-22 DIAGNOSIS — J449 Chronic obstructive pulmonary disease, unspecified: Secondary | ICD-10-CM | POA: Diagnosis not present

## 2019-11-22 DIAGNOSIS — Z483 Aftercare following surgery for neoplasm: Secondary | ICD-10-CM | POA: Diagnosis not present

## 2019-11-22 DIAGNOSIS — M1710 Unilateral primary osteoarthritis, unspecified knee: Secondary | ICD-10-CM | POA: Diagnosis not present

## 2019-11-22 DIAGNOSIS — D509 Iron deficiency anemia, unspecified: Secondary | ICD-10-CM

## 2019-11-22 DIAGNOSIS — I10 Essential (primary) hypertension: Secondary | ICD-10-CM | POA: Diagnosis not present

## 2019-11-22 DIAGNOSIS — F3341 Major depressive disorder, recurrent, in partial remission: Secondary | ICD-10-CM | POA: Diagnosis not present

## 2019-11-22 DIAGNOSIS — E790 Hyperuricemia without signs of inflammatory arthritis and tophaceous disease: Secondary | ICD-10-CM | POA: Diagnosis not present

## 2019-11-22 DIAGNOSIS — E7849 Other hyperlipidemia: Secondary | ICD-10-CM | POA: Diagnosis not present

## 2019-11-22 DIAGNOSIS — R195 Other fecal abnormalities: Secondary | ICD-10-CM

## 2019-11-22 DIAGNOSIS — J4520 Mild intermittent asthma, uncomplicated: Secondary | ICD-10-CM | POA: Diagnosis not present

## 2019-11-22 LAB — HEMOCCULT GUIAC POC 1CARD (OFFICE)
Card #1 Date: 7282021
Card #2 Fecal Occult Blod, POC: POSITIVE
Card #3 Fecal Occult Blood, POC: POSITIVE
Fecal Occult Blood, POC: POSITIVE — AB

## 2019-11-22 NOTE — Progress Notes (Signed)
Patient had positive stool cards today dropped off. Called patient to inform I placed a referral for GI consult but she did not answer the phone and her VM is not set up.  CM

## 2019-11-22 NOTE — Telephone Encounter (Signed)
[name] PT [facility] is calling and would like PT orders 2x2 and then 1x1

## 2019-11-22 NOTE — Telephone Encounter (Signed)
Kirsten Snyder and gave him verbal orders. [name] verbalized understanding and said pt is doing better.  KP

## 2019-11-22 NOTE — Telephone Encounter (Signed)
Tried calling pt. She dropped of stool cards today. They were tested and all three came back positive for blood in the stool. Placed a referral for a GI consult for patient and tried calling pt to let her know but could not get her on the phone today.  Referral is placed for [place] since she lives here.  CM

## 2019-11-24 DIAGNOSIS — E790 Hyperuricemia without signs of inflammatory arthritis and tophaceous disease: Secondary | ICD-10-CM | POA: Diagnosis not present

## 2019-11-24 DIAGNOSIS — F3341 Major depressive disorder, recurrent, in partial remission: Secondary | ICD-10-CM | POA: Diagnosis not present

## 2019-11-24 DIAGNOSIS — E7849 Other hyperlipidemia: Secondary | ICD-10-CM | POA: Diagnosis not present

## 2019-11-24 DIAGNOSIS — I10 Essential (primary) hypertension: Secondary | ICD-10-CM | POA: Diagnosis not present

## 2019-11-24 DIAGNOSIS — Z483 Aftercare following surgery for neoplasm: Secondary | ICD-10-CM | POA: Diagnosis not present

## 2019-11-24 DIAGNOSIS — J4520 Mild intermittent asthma, uncomplicated: Secondary | ICD-10-CM | POA: Diagnosis not present

## 2019-11-24 DIAGNOSIS — C700 Malignant neoplasm of cerebral meninges: Secondary | ICD-10-CM | POA: Diagnosis not present

## 2019-11-24 DIAGNOSIS — M1710 Unilateral primary osteoarthritis, unspecified knee: Secondary | ICD-10-CM | POA: Diagnosis not present

## 2019-11-24 DIAGNOSIS — J449 Chronic obstructive pulmonary disease, unspecified: Secondary | ICD-10-CM | POA: Diagnosis not present

## 2019-11-27 ENCOUNTER — Telehealth: Payer: Self-pay | Admitting: Family Medicine

## 2019-11-27 NOTE — Telephone Encounter (Signed)
Copied from [place] [phone]. Topic: Quick Communication - See Telephone Encounter >> Nov 27, 2019 11:15 AM Blase Mess A wrote: CRM for notification. See Telephone encounter for: 11/27/19.  Patient missed a phone call no VM was left CB- [phone]

## 2019-11-29 DIAGNOSIS — J449 Chronic obstructive pulmonary disease, unspecified: Secondary | ICD-10-CM | POA: Diagnosis not present

## 2019-11-29 DIAGNOSIS — C700 Malignant neoplasm of cerebral meninges: Secondary | ICD-10-CM | POA: Diagnosis not present

## 2019-11-29 DIAGNOSIS — E7849 Other hyperlipidemia: Secondary | ICD-10-CM | POA: Diagnosis not present

## 2019-11-29 DIAGNOSIS — J4520 Mild intermittent asthma, uncomplicated: Secondary | ICD-10-CM | POA: Diagnosis not present

## 2019-11-29 DIAGNOSIS — E790 Hyperuricemia without signs of inflammatory arthritis and tophaceous disease: Secondary | ICD-10-CM | POA: Diagnosis not present

## 2019-11-29 DIAGNOSIS — M1710 Unilateral primary osteoarthritis, unspecified knee: Secondary | ICD-10-CM | POA: Diagnosis not present

## 2019-11-29 DIAGNOSIS — F3341 Major depressive disorder, recurrent, in partial remission: Secondary | ICD-10-CM | POA: Diagnosis not present

## 2019-11-29 DIAGNOSIS — Z483 Aftercare following surgery for neoplasm: Secondary | ICD-10-CM | POA: Diagnosis not present

## 2019-11-29 DIAGNOSIS — I10 Essential (primary) hypertension: Secondary | ICD-10-CM | POA: Diagnosis not present

## 2019-12-01 DIAGNOSIS — F3341 Major depressive disorder, recurrent, in partial remission: Secondary | ICD-10-CM | POA: Diagnosis not present

## 2019-12-01 DIAGNOSIS — J449 Chronic obstructive pulmonary disease, unspecified: Secondary | ICD-10-CM | POA: Diagnosis not present

## 2019-12-01 DIAGNOSIS — M1710 Unilateral primary osteoarthritis, unspecified knee: Secondary | ICD-10-CM | POA: Diagnosis not present

## 2019-12-01 DIAGNOSIS — I10 Essential (primary) hypertension: Secondary | ICD-10-CM | POA: Diagnosis not present

## 2019-12-01 DIAGNOSIS — C700 Malignant neoplasm of cerebral meninges: Secondary | ICD-10-CM | POA: Diagnosis not present

## 2019-12-01 DIAGNOSIS — E7849 Other hyperlipidemia: Secondary | ICD-10-CM | POA: Diagnosis not present

## 2019-12-01 DIAGNOSIS — E790 Hyperuricemia without signs of inflammatory arthritis and tophaceous disease: Secondary | ICD-10-CM | POA: Diagnosis not present

## 2019-12-01 DIAGNOSIS — Z483 Aftercare following surgery for neoplasm: Secondary | ICD-10-CM | POA: Diagnosis not present

## 2019-12-01 DIAGNOSIS — J4520 Mild intermittent asthma, uncomplicated: Secondary | ICD-10-CM | POA: Diagnosis not present

## 2019-12-06 DIAGNOSIS — I10 Essential (primary) hypertension: Secondary | ICD-10-CM | POA: Diagnosis not present

## 2019-12-06 DIAGNOSIS — E790 Hyperuricemia without signs of inflammatory arthritis and tophaceous disease: Secondary | ICD-10-CM | POA: Diagnosis not present

## 2019-12-06 DIAGNOSIS — C700 Malignant neoplasm of cerebral meninges: Secondary | ICD-10-CM | POA: Diagnosis not present

## 2019-12-06 DIAGNOSIS — M1710 Unilateral primary osteoarthritis, unspecified knee: Secondary | ICD-10-CM | POA: Diagnosis not present

## 2019-12-06 DIAGNOSIS — J4520 Mild intermittent asthma, uncomplicated: Secondary | ICD-10-CM | POA: Diagnosis not present

## 2019-12-06 DIAGNOSIS — J449 Chronic obstructive pulmonary disease, unspecified: Secondary | ICD-10-CM | POA: Diagnosis not present

## 2019-12-06 DIAGNOSIS — E7849 Other hyperlipidemia: Secondary | ICD-10-CM | POA: Diagnosis not present

## 2019-12-06 DIAGNOSIS — F3341 Major depressive disorder, recurrent, in partial remission: Secondary | ICD-10-CM | POA: Diagnosis not present

## 2019-12-06 DIAGNOSIS — Z483 Aftercare following surgery for neoplasm: Secondary | ICD-10-CM | POA: Diagnosis not present

## 2019-12-08 DIAGNOSIS — E790 Hyperuricemia without signs of inflammatory arthritis and tophaceous disease: Secondary | ICD-10-CM | POA: Diagnosis not present

## 2019-12-08 DIAGNOSIS — Z483 Aftercare following surgery for neoplasm: Secondary | ICD-10-CM | POA: Diagnosis not present

## 2019-12-08 DIAGNOSIS — C700 Malignant neoplasm of cerebral meninges: Secondary | ICD-10-CM | POA: Diagnosis not present

## 2019-12-08 DIAGNOSIS — I10 Essential (primary) hypertension: Secondary | ICD-10-CM | POA: Diagnosis not present

## 2019-12-08 DIAGNOSIS — E7849 Other hyperlipidemia: Secondary | ICD-10-CM | POA: Diagnosis not present

## 2019-12-08 DIAGNOSIS — J449 Chronic obstructive pulmonary disease, unspecified: Secondary | ICD-10-CM | POA: Diagnosis not present

## 2019-12-08 DIAGNOSIS — F3341 Major depressive disorder, recurrent, in partial remission: Secondary | ICD-10-CM | POA: Diagnosis not present

## 2019-12-08 DIAGNOSIS — J4520 Mild intermittent asthma, uncomplicated: Secondary | ICD-10-CM | POA: Diagnosis not present

## 2019-12-08 DIAGNOSIS — M1710 Unilateral primary osteoarthritis, unspecified knee: Secondary | ICD-10-CM | POA: Diagnosis not present

## 2019-12-11 ENCOUNTER — Other Ambulatory Visit: Payer: Self-pay | Admitting: Family Medicine

## 2019-12-11 DIAGNOSIS — K219 Gastro-esophageal reflux disease without esophagitis: Secondary | ICD-10-CM

## 2019-12-11 NOTE — Telephone Encounter (Signed)
Requested medication (s) are due for refill today: unsure  Requested medication (s) are on the active medication list: No  Last refill:  historic med  Future visit scheduled: yes 2 weeks  Notes to clinic:  historic provider and medication    Requested Prescriptions  Pending Prescriptions Disp Refills   ALLERGY RELIEF 10 MG tablet [Pharmacy Med Name: ALLERGY RELIEF 10 MG TAB] 90 tablet     Sig: TAKE (1) TABLET BY MOUTH EVERY DAY      Ear, Nose, and Throat:  Antihistamines Passed - 12/11/2019 12:34 PM      Passed - Valid encounter within last 12 months    Recent Outpatient Visits           3 weeks ago Hospital discharge follow-up   [facility] [name], MD   2 months ago Fever and chills   [facility] [name], MD   8 months ago Essential hypertension   [place], Deanna C, MD   1 year ago [facility] [name], MD   1 year ago Mild intermittent asthma without complication   [facility] [name], MD       Future Appointments             In 2 weeks [name], MD [facility], [facility]             Signed Prescriptions Disp Refills   omeprazole (PRILOSEC) 20 MG capsule 90 capsule 0    Sig: TAKE (1) [place]      Gastroenterology: Proton Pump Inhibitors Passed - 12/11/2019 12:34 PM      Passed - Valid encounter within last 12 months    Recent Outpatient Visits           3 weeks ago Hospital discharge follow-up   [facility] [name], MD   2 months ago Fever and chills   [facility] [name], MD   8 months ago Essential hypertension   [place], Deanna C, MD   1 year ago [place], MD   1 year ago Mild intermittent asthma without complication   [facility] [name], MD       Future Appointments             In 2 weeks [name], MD [facility], [facility]

## 2019-12-11 NOTE — Telephone Encounter (Signed)
Requested Prescriptions  Pending Prescriptions Disp Refills  . omeprazole (PRILOSEC) 20 MG capsule [Pharmacy Med Name: OMEPRAZOLE DR 20 MG CAP] 90 capsule 0    Sig: TAKE (1) CAPSULE BY MOUTH EVERY DAY     Gastroenterology: Proton Pump Inhibitors Passed - 12/11/2019 12:34 PM      Passed - Valid encounter within last 12 months    Recent Outpatient Visits          3 weeks ago Hospital discharge follow-up   [place], MD   2 months ago Fever and chills   [facility] [name], MD   8 months ago Essential hypertension   [place], Deanna C, MD   1 year ago [place], MD   1 year ago Mild intermittent asthma without complication   [facility] [name], MD      Future Appointments            In 2 weeks [name], MD [facility], [place]           . ALLERGY RELIEF 10 MG tablet [Pharmacy Med Name: ALLERGY RELIEF 10 MG TAB] 90 tablet     Sig: TAKE (1) TABLET BY MOUTH EVERY DAY     Ear, Nose, and Throat:  Antihistamines Passed - 12/11/2019 12:34 PM      Passed - Valid encounter within last 12 months    Recent Outpatient Visits          3 weeks ago Hospital discharge follow-up   [place], MD   2 months ago Fever and chills   [facility] [name], MD   8 months ago Essential hypertension   [place], Deanna C, MD   1 year ago [place], MD   1 year ago Mild intermittent asthma without complication   [facility] [name], MD      Future Appointments            In 2 weeks [name], MD [facility], [facility]

## 2019-12-13 DIAGNOSIS — F3341 Major depressive disorder, recurrent, in partial remission: Secondary | ICD-10-CM | POA: Diagnosis not present

## 2019-12-13 DIAGNOSIS — I10 Essential (primary) hypertension: Secondary | ICD-10-CM | POA: Diagnosis not present

## 2019-12-13 DIAGNOSIS — J4520 Mild intermittent asthma, uncomplicated: Secondary | ICD-10-CM | POA: Diagnosis not present

## 2019-12-13 DIAGNOSIS — J449 Chronic obstructive pulmonary disease, unspecified: Secondary | ICD-10-CM | POA: Diagnosis not present

## 2019-12-13 DIAGNOSIS — E790 Hyperuricemia without signs of inflammatory arthritis and tophaceous disease: Secondary | ICD-10-CM | POA: Diagnosis not present

## 2019-12-13 DIAGNOSIS — Z483 Aftercare following surgery for neoplasm: Secondary | ICD-10-CM | POA: Diagnosis not present

## 2019-12-13 DIAGNOSIS — M1710 Unilateral primary osteoarthritis, unspecified knee: Secondary | ICD-10-CM | POA: Diagnosis not present

## 2019-12-13 DIAGNOSIS — C700 Malignant neoplasm of cerebral meninges: Secondary | ICD-10-CM | POA: Diagnosis not present

## 2019-12-13 DIAGNOSIS — E7849 Other hyperlipidemia: Secondary | ICD-10-CM | POA: Diagnosis not present

## 2019-12-14 ENCOUNTER — Telehealth: Payer: Self-pay | Admitting: Family Medicine

## 2019-12-14 NOTE — Telephone Encounter (Signed)
Copied from [place] [phone]. Topic: Quick Communication - Home Health Verbal Orders >> Dec 14, 2019  1:43 PM Jaynie Collins D wrote: Caller/Agency: [facility] Number: [phone] Requesting OT/PT/Skilled Nursing/Social Work/Speech Therapy: PT (re-certification) Frequency: Twice/week for 2 weeks, Once/week for 2 weeks

## 2019-12-14 NOTE — Telephone Encounter (Signed)
Called [name]- gave ok on all above- he stated pt is getting out walking with the assistant. She is doing well

## 2019-12-21 ENCOUNTER — Other Ambulatory Visit: Payer: Self-pay

## 2019-12-21 ENCOUNTER — Ambulatory Visit (INDEPENDENT_AMBULATORY_CARE_PROVIDER_SITE_OTHER): Payer: Medicare HMO | Admitting: Family Medicine

## 2019-12-21 ENCOUNTER — Encounter: Payer: Self-pay | Admitting: Family Medicine

## 2019-12-21 ENCOUNTER — Ambulatory Visit: Payer: Self-pay

## 2019-12-21 ENCOUNTER — Other Ambulatory Visit
Admission: RE | Admit: 2019-12-21 | Discharge: 2019-12-21 | Disposition: A | Discharge status: Home / Self Care | Payer: Medicare HMO | Attending: Family Medicine | Admitting: Family Medicine

## 2019-12-21 VITALS — BP 130/80 | HR 80 | Ht 64.0 in | Wt 227.0 lb

## 2019-12-21 DIAGNOSIS — R42 Dizziness and giddiness: Secondary | ICD-10-CM

## 2019-12-21 DIAGNOSIS — R5383 Other fatigue: Secondary | ICD-10-CM

## 2019-12-21 DIAGNOSIS — R5381 Other malaise: Secondary | ICD-10-CM | POA: Diagnosis not present

## 2019-12-21 DIAGNOSIS — I82432 Acute embolism and thrombosis of left popliteal vein: Secondary | ICD-10-CM | POA: Diagnosis not present

## 2019-12-21 DIAGNOSIS — D509 Iron deficiency anemia, unspecified: Secondary | ICD-10-CM | POA: Diagnosis not present

## 2019-12-21 DIAGNOSIS — F5101 Primary insomnia: Secondary | ICD-10-CM

## 2019-12-21 DIAGNOSIS — Z7901 Long term (current) use of anticoagulants: Secondary | ICD-10-CM

## 2019-12-21 DIAGNOSIS — I10 Essential (primary) hypertension: Secondary | ICD-10-CM | POA: Diagnosis not present

## 2019-12-21 DIAGNOSIS — Z483 Aftercare following surgery for neoplasm: Secondary | ICD-10-CM | POA: Diagnosis not present

## 2019-12-21 DIAGNOSIS — Z8719 Personal history of other diseases of the digestive system: Secondary | ICD-10-CM | POA: Diagnosis not present

## 2019-12-21 DIAGNOSIS — Z23 Encounter for immunization: Secondary | ICD-10-CM | POA: Diagnosis not present

## 2019-12-21 DIAGNOSIS — H1031 Unspecified acute conjunctivitis, right eye: Secondary | ICD-10-CM

## 2019-12-21 DIAGNOSIS — J4520 Mild intermittent asthma, uncomplicated: Secondary | ICD-10-CM | POA: Diagnosis not present

## 2019-12-21 DIAGNOSIS — C700 Malignant neoplasm of cerebral meninges: Secondary | ICD-10-CM | POA: Diagnosis not present

## 2019-12-21 DIAGNOSIS — E7849 Other hyperlipidemia: Secondary | ICD-10-CM | POA: Diagnosis not present

## 2019-12-21 DIAGNOSIS — I2694 Multiple subsegmental pulmonary emboli without acute cor pulmonale: Secondary | ICD-10-CM | POA: Diagnosis not present

## 2019-12-21 DIAGNOSIS — J449 Chronic obstructive pulmonary disease, unspecified: Secondary | ICD-10-CM | POA: Diagnosis not present

## 2019-12-21 LAB — RENAL FUNCTION PANEL
Albumin: 4 g/dL (ref 3.5–5.0)
Anion gap: 9 (ref 5–15)
BUN: 17 mg/dL (ref 8–23)
CO2: 27 mmol/L (ref 22–32)
Calcium: 8.8 mg/dL — ABNORMAL LOW (ref 8.9–10.3)
Chloride: 102 mmol/L (ref 98–111)
Creatinine, Ser: 0.76 mg/dL (ref 0.44–1.00)
GFR calc Af Amer: >60 mL/min (ref >60)
GFR calc non Af Amer: >60 mL/min (ref >60)
Glucose, Bld: 107 mg/dL — ABNORMAL HIGH (ref 70–99)
Phosphorus: 4.1 mg/dL (ref 2.5–4.6)
Potassium: 4 mmol/L (ref 3.5–5.1)
Sodium: 138 mmol/L (ref 135–145)

## 2019-12-21 LAB — CBC
HCT: 39.2 % (ref 36.0–46.0)
Hemoglobin: 12.6 g/dL (ref 12.0–15.0)
MCH: 30.1 pg (ref 26.0–34.0)
MCHC: 32.1 g/dL (ref 30.0–36.0)
MCV: 93.6 fL (ref 80.0–100.0)
Platelets: 287 10*3/uL (ref 150–400)
RBC: 4.19 MIL/uL (ref 3.87–5.11)
RDW: 14.4 % (ref 11.5–15.5)
WBC: 5.4 10*3/uL (ref 4.0–10.5)
nRBC: 0 % (ref 0.0–0.2)

## 2019-12-21 MED ORDER — TOBRAMYCIN-DEXAMETHASONE 0.3-0.1 % OP SUSP: 2.0000 [drp] | OPHTHALMIC | 0 refills | Status: DC

## 2019-12-21 NOTE — Progress Notes (Signed)
Date:  12/21/2019   Name:  Kirsten Snyder   DOB:  1933-12-27   MRN:  [id number]   Chief Complaint: Dizziness (flu shot-PHQ-5 GAD -10-weak X2 days, PT said that BP has been running low, eyes wont focus, headaches)  Dizziness This is a new problem. The current episode started in the past 7 days. The problem occurs 2 to 4 times per day. The problem has been waxing and waning. Associated symptoms include fatigue. Pertinent negatives include no abdominal pain, change in bowel habit, chest pain, congestion, coughing, headaches, numbness, vertigo, visual change, vomiting or weakness. Nothing aggravates the symptoms.    Lab Results  Component Value Date   CREATININE 0.82 11/15/2019   BUN 12 11/15/2019   NA 140 11/15/2019   K 4.4 11/15/2019   CL 103 11/15/2019   CO2 29 11/15/2019   Lab Results  Component Value Date   CHOL 227 (H) 04/11/2019   HDL 40 04/11/2019   LDLCALC 140 (H) 04/11/2019   TRIG 261 (H) 04/11/2019   CHOLHDL 4.8 (H) 11/02/2017   Lab Results  Component Value Date   TSH 2.39 09/15/2012   No results found for: HGBA1C Lab Results  Component Value Date   WBC 5.6 11/15/2019   HGB 12.0 11/15/2019   HCT 38.7 11/15/2019   MCV 97.5 11/15/2019   PLT 382 11/15/2019   Lab Results  Component Value Date   ALT 20 10/01/2019   AST 19 10/01/2019   ALKPHOS 59 10/01/2019   BILITOT 1.0 10/01/2019     Review of Systems  Constitutional: Positive for fatigue.  HENT: Negative for congestion.   Respiratory: Negative for cough.   Cardiovascular: Negative for chest pain.  Gastrointestinal: Negative for abdominal pain, change in bowel habit and vomiting.  Neurological: Positive for dizziness. Negative for vertigo, weakness, numbness and headaches.    Patient Active Problem List   Diagnosis Date Noted  . Sepsis secondary to UTI ([place]) 10/01/2019  . Sepsis ([place]) 10/01/2019  . Hypokalemia 10/01/2019  . Atherosclerosis of abdominal aorta ([place]) 10/12/2018  . SOBOE (shortness of  breath on exertion) 10/12/2018  . Osteopenia of neck of right femur 06/29/2018  . Breast cancer, right ([place]) 06/17/2016  . Essential hypertension 12/13/2015  . Breast abscess of female 11/07/2015  . Familial multiple lipoprotein-type hyperlipidemia 10/25/2014  . Clinical depression 10/25/2014  . Acid reflux 10/25/2014  . Acute gastrointestinal bleeding 10/25/2014  . Aggrieved 10/25/2014  . Airway hyperreactivity 10/25/2014  . Arthritis of knee 10/25/2014  . H/O: gout 10/25/2014  . Adiposity 10/25/2014  . Arthritis, degenerative 10/25/2014  . H/O adenomatous polyp of colon 03/07/2014    Allergies  Allergen Reactions  . Sulfa Antibiotics     Past Surgical History:  Procedure Laterality Date  . BREAST BIOPSY Bilateral    negative  . BREAST EXCISIONAL BIOPSY Right    right positive 04/2010  . BREAST LUMPECTOMY Right    removed a cancerous lump  . VAGINAL HYSTERECTOMY      Social History   Tobacco Use  . Smoking status: Former Smoker    Packs/day: 0.25    Years: 2.00    Pack years: 0.50    Types: Cigarettes  . Smokeless tobacco: Never Used  . Tobacco comment: smoking cessation materials not required  Vaping Use  . Vaping Use: Never used  Substance Use Topics  . Alcohol use: No    Alcohol/week: 0.0 standard drinks  . Drug use: No     Medication list has  been reviewed and updated.  Current Meds  Medication Sig  . albuterol (VENTOLIN HFA) 108 (90 Base) MCG/ACT inhaler Inhale 2 puffs into the lungs every 6 (six) hours as needed for wheezing or shortness of breath.  . ALLERGY RELIEF 10 MG tablet TAKE (1) TABLET BY MOUTH EVERY DAY  . allopurinol (ZYLOPRIM) 100 MG tablet 1 tablet by mouth daily (Patient taking differently: Take 100 mg by mouth at bedtime. For gout)  . apixaban (ELIQUIS) 5 MG TABS tablet Take 1 tablet (5 mg total) by mouth 2 (two) times daily.  . calcium carbonate (TUMS - DOSED IN MG ELEMENTAL CALCIUM) 500 MG chewable tablet Chew 1 tablet by mouth every  8 (eight) hours as needed for indigestion.  . cetirizine (ZYRTEC) 10 MG tablet Take 10 mg by mouth daily. For allergies.  . ferrous sulfate 325 (65 FE) MG tablet Take 325 mg by mouth daily with breakfast.  . Fluticasone-Umeclidin-Vilant (TRELEGY ELLIPTA) 100-62.5-25 MCG/INH AEPB Inhale 1 puff into the lungs daily.  Marland Kitchen lovastatin (MEVACOR) 40 MG tablet TAKE (1) TABLET BY MOUTH DAILY AT BEDTIME  . omeprazole (PRILOSEC) 20 MG capsule TAKE (1) CAPSULE BY MOUTH EVERY DAY  . sertraline (ZOLOFT) 50 MG tablet TAKE (1) TABLET BY MOUTH EVERY DAY  . traZODone (DESYREL) 50 MG tablet TAKE (1) TABLET BY MOUTH DAILY AT BEDTIME    PHQ 2/9 Scores 12/21/2019 11/14/2019 09/14/2019 05/10/2019  PHQ - 2 Score 2 0 0 0  PHQ- 9 Score 5 0 2 -    GAD 7 : Generalized Anxiety Score 12/21/2019 11/14/2019 09/14/2019 04/11/2019  Nervous, Anxious, on Edge 3 0 1 0  Control/stop worrying 3 0 1 0  Worry too much - different things 3 0 1 0  Trouble relaxing 1 0 1 0  Restless 0 0 0 0  Easily annoyed or irritable 0 0 0 0  Afraid - awful might happen 0 0 0 0  Total GAD 7 Score 10 0 4 0  Anxiety Difficulty Not difficult at all Not difficult at all Not difficult at all -    BP Readings from Last 3 Encounters:  12/21/19 130/80  11/14/19 (!) 136/78  10/03/19 118/65    Physical Exam  Wt Readings from Last 3 Encounters:  12/21/19 227 lb (103 kg)  11/14/19 (!) 225 lb (102.1 kg)  10/03/19 234 lb 14.4 oz (106.5 kg)    BP 130/80   Pulse 80   Ht 5\' 4"  (1.626 m)   Wt 227 lb (103 kg)   BMI 38.96 kg/m   Assessment and Plan: 1. Malaise and fatigue New onset.  Persistent.  Patient has been having more fatigue and generally not feeling well for several days.  Patient has had a history of surgery for a intracranial tumor, and hospitalization for pulmonary embolus.  She is currently on medication for anticoagulation which is Eliquis.  We will obtain a CBC with differential and renal function panel downstairs and review for possible  bleed or infection. - CBC with Differential/Platelet - Renal Function Panel  2. Dizziness New onset.  Persistent.  Nonvertiginous.  Patient has had dizziness particularly with change in position.  Patient's blood pressure is acceptable at 130/80 and is afebrile.  As noted above we will check lab work to rule out blood loss or electrolyte imbalance. - CBC with Differential/Platelet - Renal Function Panel  3. Acute bacterial conjunctivitis of right eye New onset notes and inflammation of the left eye for which it is watering and is irritated.  Conjunctiva is inflamed and we will begin TobraDex ophthalmic solution 2 drops in both eyes every 4 hours while awake. - tobramycin-dexamethasone (TOBRADEX) ophthalmic solution; Place 2 drops into both eyes every 4 (four) hours while awake.  Dispense: 5 mL; Refill: 0  4. Need for immunization against influenza Discussed and administered - Flu Vaccine QUAD High Dose(Fluad)  5. Current use of anticoagulant therapy Patient is on Eliquis and given her current symptoms we will rule out any possibility of a occult upper GI bleed.  6. Primary insomnia Chronic.  Patient is currently on trazodone.  We will increase to 100 mg has been suggested that she take 2 tablets to see if this helps with sleep pattern better.

## 2019-12-21 NOTE — Telephone Encounter (Signed)
Clarnce Flock, PTA is in the home with the patient and the patient reported feeling dizzy and weak. Her BP 144/77 reported by [facility]. I spoke to the patient who says she's been feeling weak, no eating very much, not feeling her best since the weekend. She says she's not dizzy at this time, but she was dizzy when she moves her head, which causes a headache, and she feels weak when she stands up. She says she's been sitting. She says she's able to do her ADL's, go to the bathroom, eat a little. She's drinking fluids. I asked if she has anyone to bring her to the office, she says her son. Appointment scheduled for today at 1600 with Dr. [name], care advice given, she verbalized understanding. I advised if her son is not able to bring her today to call and reschedule.  Reason for Disposition . [1] MODERATE dizziness (e.g., interferes with normal activities) AND [2] has NOT been evaluated by physician for this  (Exception: dizziness caused by heat exposure, sudden standing, or poor fluid intake)  Answer Assessment - Initial Assessment Questions 1. DESCRIPTION: "Describe your dizziness."     Feel woozy headed 2. LIGHTHEADED: "Do you feel lightheaded?" (e.g., somewhat faint, woozy, weak upon standing)     Weak when stand 3. VERTIGO: "Do you feel like either you or the room is spinning or tilting?" (i.e. vertigo)     No 4. SEVERITY: "How bad is it?"  "Do you feel like you are going to faint?" "Can you stand and walk?"   - MILD: Feels slightly dizzy, but walking normally.   - MODERATE: Feels very unsteady when walking, but not falling; interferes with normal activities (e.g., school, work) .   - SEVERE: Unable to walk without falling, or requires assistance to walk without falling; feels like passing out now.      Moderate 5. ONSET:  "When did the dizziness begin?"     Unsure when the dizziness started, but not feeling good since the weekend 6. AGGRAVATING FACTORS: "Does anything make it worse?" (e.g.,  standing, change in head position)     Changing head makes my head hurt 7. HEART RATE: "Can you tell me your heart rate?" "How many beats in 15 seconds?"  (Note: not all patients can do this)       77 8. CAUSE: "What do you think is causing the dizziness?"     I don't know 9. RECURRENT SYMPTOM: "Have you had dizziness before?" If Yes, ask: "When was the last time?" "What happened that time?"     Right after surgery, but it got better 10. OTHER SYMPTOMS: "Do you have any other symptoms?" (e.g., fever, chest pain, vomiting, diarrhea, bleeding)      Nausea, weak, tired 11. PREGNANCY: "Is there any chance you are pregnant?" "When was your last menstrual period?"      No  Protocols used: DIZZINESS [facility]

## 2019-12-27 DIAGNOSIS — D509 Iron deficiency anemia, unspecified: Secondary | ICD-10-CM | POA: Diagnosis not present

## 2019-12-27 DIAGNOSIS — E7849 Other hyperlipidemia: Secondary | ICD-10-CM | POA: Diagnosis not present

## 2019-12-27 DIAGNOSIS — Z483 Aftercare following surgery for neoplasm: Secondary | ICD-10-CM | POA: Diagnosis not present

## 2019-12-27 DIAGNOSIS — I2694 Multiple subsegmental pulmonary emboli without acute cor pulmonale: Secondary | ICD-10-CM | POA: Diagnosis not present

## 2019-12-27 DIAGNOSIS — J4520 Mild intermittent asthma, uncomplicated: Secondary | ICD-10-CM | POA: Diagnosis not present

## 2019-12-27 DIAGNOSIS — I82432 Acute embolism and thrombosis of left popliteal vein: Secondary | ICD-10-CM | POA: Diagnosis not present

## 2019-12-27 DIAGNOSIS — C700 Malignant neoplasm of cerebral meninges: Secondary | ICD-10-CM | POA: Diagnosis not present

## 2019-12-27 DIAGNOSIS — J449 Chronic obstructive pulmonary disease, unspecified: Secondary | ICD-10-CM | POA: Diagnosis not present

## 2019-12-27 DIAGNOSIS — I10 Essential (primary) hypertension: Secondary | ICD-10-CM | POA: Diagnosis not present

## 2019-12-28 ENCOUNTER — Ambulatory Visit (INDEPENDENT_AMBULATORY_CARE_PROVIDER_SITE_OTHER): Payer: Medicare HMO | Admitting: Family Medicine

## 2019-12-28 ENCOUNTER — Ambulatory Visit: Payer: PRIVATE HEALTH INSURANCE | Admitting: Family Medicine

## 2019-12-28 ENCOUNTER — Other Ambulatory Visit: Payer: Self-pay

## 2019-12-28 ENCOUNTER — Encounter: Payer: Self-pay | Admitting: Family Medicine

## 2019-12-28 VITALS — BP 120/80 | HR 76 | Ht 64.0 in | Wt 231.0 lb

## 2019-12-28 DIAGNOSIS — F5101 Primary insomnia: Secondary | ICD-10-CM

## 2019-12-28 DIAGNOSIS — R195 Other fecal abnormalities: Secondary | ICD-10-CM

## 2019-12-28 DIAGNOSIS — D509 Iron deficiency anemia, unspecified: Secondary | ICD-10-CM

## 2019-12-28 LAB — HEMOCCULT GUIAC POC 1CARD (OFFICE)
Card #2 Fecal Occult Blod, POC: POSITIVE
Card #3 Fecal Occult Blood, POC: POSITIVE
Fecal Occult Blood, POC: POSITIVE — AB

## 2019-12-28 NOTE — Progress Notes (Signed)
Date:  12/28/2019   Name:  Kirsten Snyder   DOB:  [birth year]   MRN:  [id number]   Chief Complaint: Follow-up and Colon Cancer Screening  Anemia Presents for follow-up visit. There has been no abdominal pain, anorexia, bruising/bleeding easily, confusion, fever, leg swelling, light-headedness, malaise/fatigue, pallor, palpitations, paresthesias, pica or weight loss. Signs of blood loss that are not present include hematemesis, hematochezia, melena, menorrhagia and vaginal bleeding. There are no compliance problems.   Insomnia Primary symptoms: difficulty falling asleep, no malaise/fatigue.  The current episode started more than one year. The problem has been waxing and waning since onset. The treatment provided mild relief.    Lab Results  Component Value Date   CREATININE 0.76 12/21/2019   BUN 17 12/21/2019   NA 138 12/21/2019   K 4.0 12/21/2019   CL 102 12/21/2019   CO2 27 12/21/2019   Lab Results  Component Value Date   CHOL 227 (H) 04/11/2019   HDL 40 04/11/2019   LDLCALC 140 (H) 04/11/2019   TRIG 261 (H) 04/11/2019   CHOLHDL 4.8 (H) 11/02/2017   Lab Results  Component Value Date   TSH 2.39 09/15/2012   No results found for: HGBA1C Lab Results  Component Value Date   WBC 5.4 12/21/2019   HGB 12.6 12/21/2019   HCT 39.2 12/21/2019   MCV 93.6 12/21/2019   PLT 287 12/21/2019   Lab Results  Component Value Date   ALT 20 10/01/2019   AST 19 10/01/2019   ALKPHOS 59 10/01/2019   BILITOT 1.0 10/01/2019     Review of Systems  Constitutional: Negative.  Negative for chills, fatigue, fever, malaise/fatigue, unexpected weight change and weight loss.  HENT: Negative for congestion, ear discharge, ear pain, rhinorrhea, sinus pressure, sneezing and sore throat.   Eyes: Negative for photophobia, pain, discharge, redness and itching.  Respiratory: Negative for cough, shortness of breath, wheezing and stridor.   Cardiovascular: Negative for palpitations.   Gastrointestinal: Negative for abdominal pain, anorexia, blood in stool, constipation, diarrhea, hematemesis, hematochezia, melena, nausea and vomiting.  Endocrine: Negative for cold intolerance, heat intolerance, polydipsia, polyphagia and polyuria.  Genitourinary: Negative for dysuria, flank pain, frequency, hematuria, menorrhagia, menstrual problem, pelvic pain, urgency, vaginal bleeding and vaginal discharge.  Musculoskeletal: Negative for arthralgias, back pain and myalgias.  Skin: Negative for pallor and rash.  Allergic/Immunologic: Negative for environmental allergies and food allergies.  Neurological: Negative for dizziness, weakness, light-headedness, numbness, headaches and paresthesias.  Hematological: Negative for adenopathy. Does not bruise/bleed easily.  Psychiatric/Behavioral: Negative for confusion and dysphoric mood. The patient has insomnia. The patient is not nervous/anxious.     Patient Active Problem List   Diagnosis Date Noted  . Sepsis secondary to UTI ([place]) 10/01/2019  . Sepsis ([place]) 10/01/2019  . Hypokalemia 10/01/2019  . Atherosclerosis of abdominal aorta ([place]) 10/12/2018  . SOBOE (shortness of breath on exertion) 10/12/2018  . Osteopenia of neck of right femur 06/29/2018  . Breast cancer, right ([place]) 06/17/2016  . Essential hypertension 12/13/2015  . Breast abscess of female 11/07/2015  . Familial multiple lipoprotein-type hyperlipidemia 10/25/2014  . Clinical depression 10/25/2014  . Acid reflux 10/25/2014  . Acute gastrointestinal bleeding 10/25/2014  . Aggrieved 10/25/2014  . Airway hyperreactivity 10/25/2014  . Arthritis of knee 10/25/2014  . H/O: gout 10/25/2014  . Adiposity 10/25/2014  . Arthritis, degenerative 10/25/2014  . H/O adenomatous polyp of colon 03/07/2014    Allergies  Allergen Reactions  . Sulfa Antibiotics     Past Surgical  History:  Procedure Laterality Date  . BREAST BIOPSY Bilateral    negative  . BREAST EXCISIONAL BIOPSY  Right    right positive 04/2010  . BREAST LUMPECTOMY Right    removed a cancerous lump  . VAGINAL HYSTERECTOMY      Social History   Tobacco Use  . Smoking status: Former Smoker    Packs/day: 0.25    Years: 2.00    Pack years: 0.50    Types: Cigarettes  . Smokeless tobacco: Never Used  . Tobacco comment: smoking cessation materials not required  Vaping Use  . Vaping Use: Never used  Substance Use Topics  . Alcohol use: No    Alcohol/week: 0.0 standard drinks  . Drug use: No     Medication list has been reviewed and updated.  Current Meds  Medication Sig  . albuterol (VENTOLIN HFA) 108 (90 Base) MCG/ACT inhaler Inhale 2 puffs into the lungs every 6 (six) hours as needed for wheezing or shortness of breath.  . ALLERGY RELIEF 10 MG tablet TAKE (1) TABLET BY MOUTH EVERY DAY  . allopurinol (ZYLOPRIM) 100 MG tablet 1 tablet by mouth daily (Patient taking differently: Take 100 mg by mouth at bedtime. For gout)  . apixaban (ELIQUIS) 5 MG TABS tablet Take 1 tablet (5 mg total) by mouth 2 (two) times daily.  . calcium carbonate (TUMS - DOSED IN MG ELEMENTAL CALCIUM) 500 MG chewable tablet Chew 1 tablet by mouth every 8 (eight) hours as needed for indigestion.  . cetirizine (ZYRTEC) 10 MG tablet Take 10 mg by mouth daily. For allergies.  . ferrous sulfate 325 (65 FE) MG tablet Take 325 mg by mouth daily with breakfast.  . Fluticasone-Umeclidin-Vilant (TRELEGY ELLIPTA) 100-62.5-25 MCG/INH AEPB Inhale 1 puff into the lungs daily.  Marland Kitchen lovastatin (MEVACOR) 40 MG tablet TAKE (1) TABLET BY MOUTH DAILY AT BEDTIME  . omeprazole (PRILOSEC) 20 MG capsule TAKE (1) CAPSULE BY MOUTH EVERY DAY  . sertraline (ZOLOFT) 50 MG tablet TAKE (1) TABLET BY MOUTH EVERY DAY  . tobramycin-dexamethasone (TOBRADEX) ophthalmic solution Place 2 drops into both eyes every 4 (four) hours while awake.  . traZODone (DESYREL) 50 MG tablet TAKE (1) TABLET BY MOUTH DAILY AT BEDTIME    PHQ 2/9 Scores 12/21/2019 11/14/2019  09/14/2019 05/10/2019  PHQ - 2 Score 2 0 0 0  PHQ- 9 Score 5 0 2 -    GAD 7 : Generalized Anxiety Score 12/21/2019 11/14/2019 09/14/2019 04/11/2019  Nervous, Anxious, on Edge 3 0 1 0  Control/stop worrying 3 0 1 0  Worry too much - different things 3 0 1 0  Trouble relaxing 1 0 1 0  Restless 0 0 0 0  Easily annoyed or irritable 0 0 0 0  Afraid - awful might happen 0 0 0 0  Total GAD 7 Score 10 0 4 0  Anxiety Difficulty Not difficult at all Not difficult at all Not difficult at all -    BP Readings from Last 3 Encounters:  12/28/19 120/80  12/21/19 130/80  11/14/19 (!) 136/78    Physical Exam Vitals and nursing note reviewed.  Constitutional:      Appearance: She is well-developed.  HENT:     Head: Normocephalic.     Right Ear: Tympanic membrane, ear canal and external ear normal.     Left Ear: Tympanic membrane, ear canal and external ear normal.  Eyes:     General: Lids are everted, no foreign bodies appreciated. No scleral icterus.  Left eye: No foreign body or hordeolum.     Conjunctiva/sclera: Conjunctivae normal.     Right eye: Right conjunctiva is not injected.     Left eye: Left conjunctiva is not injected.     Pupils: Pupils are equal, round, and reactive to light.  Neck:     Thyroid: No thyromegaly.     Vascular: No JVD.     Trachea: No tracheal deviation.  Cardiovascular:     Rate and Rhythm: Normal rate and regular rhythm.     Heart sounds: Normal heart sounds. No murmur heard.  No friction rub. No gallop.   Pulmonary:     Effort: Pulmonary effort is normal. No respiratory distress.     Breath sounds: Normal breath sounds. No wheezing or rales.  Abdominal:     General: Bowel sounds are normal.     Palpations: Abdomen is soft. There is no mass.     Tenderness: There is no abdominal tenderness. There is no guarding or rebound.  Musculoskeletal:        General: No tenderness. Normal range of motion.     Cervical back: Normal range of motion and neck  supple.  Lymphadenopathy:     Cervical: No cervical adenopathy.  Skin:    General: Skin is warm.     Findings: No rash.  Neurological:     Mental Status: She is alert and oriented to person, place, and time.     Cranial Nerves: No cranial nerve deficit.     Deep Tendon Reflexes: Reflexes normal.  Psychiatric:        Mood and Affect: Mood is not anxious or depressed.     Wt Readings from Last 3 Encounters:  12/28/19 231 lb (104.8 kg)  12/21/19 227 lb (103 kg)  11/14/19 (!) 225 lb (102.1 kg)    BP 120/80   Pulse 76   Ht 5\' 4"  (1.626 m)   Wt 231 lb (104.8 kg)   BMI 39.65 kg/m   Assessment and Plan: 1. Iron deficiency anemia, unspecified iron deficiency anemia type Chronic.  Persistent.  Stable.  Last hemoglobin was 12.6.  Hemoccults were positive x3.  Patient has an upcoming appointment with [facility] in October for likely endoscopy/colonoscopy possibilities.  2. Guaiac positive stools Guaiac was rechecked today and was noted to be positive x3.  Patient is anticipating call from [place] for scheduling of procedures if necessary. - POCT Occult Blood Stool  3. Primary insomnia .  Controlled.  Stable.  Partially controlled.  Patient will continue trazodone 50 mg 1 to 2 tablets nightly for sleep augmentation.

## 2019-12-29 DIAGNOSIS — D509 Iron deficiency anemia, unspecified: Secondary | ICD-10-CM | POA: Diagnosis not present

## 2019-12-29 DIAGNOSIS — J449 Chronic obstructive pulmonary disease, unspecified: Secondary | ICD-10-CM | POA: Diagnosis not present

## 2019-12-29 DIAGNOSIS — C700 Malignant neoplasm of cerebral meninges: Secondary | ICD-10-CM | POA: Diagnosis not present

## 2019-12-29 DIAGNOSIS — E7849 Other hyperlipidemia: Secondary | ICD-10-CM | POA: Diagnosis not present

## 2019-12-29 DIAGNOSIS — I2694 Multiple subsegmental pulmonary emboli without acute cor pulmonale: Secondary | ICD-10-CM | POA: Diagnosis not present

## 2019-12-29 DIAGNOSIS — I82432 Acute embolism and thrombosis of left popliteal vein: Secondary | ICD-10-CM | POA: Diagnosis not present

## 2019-12-29 DIAGNOSIS — I10 Essential (primary) hypertension: Secondary | ICD-10-CM | POA: Diagnosis not present

## 2019-12-29 DIAGNOSIS — J4520 Mild intermittent asthma, uncomplicated: Secondary | ICD-10-CM | POA: Diagnosis not present

## 2019-12-29 DIAGNOSIS — Z483 Aftercare following surgery for neoplasm: Secondary | ICD-10-CM | POA: Diagnosis not present

## 2020-01-01 ENCOUNTER — Other Ambulatory Visit: Payer: Self-pay

## 2020-01-01 ENCOUNTER — Ambulatory Visit: Payer: Self-pay

## 2020-01-01 DIAGNOSIS — N3090 Cystitis, unspecified without hematuria: Secondary | ICD-10-CM

## 2020-01-01 MED ORDER — CEPHALEXIN 500 MG PO CAPS: 500.0000 mg | ORAL_CAPSULE | Freq: Three times a day (TID) | ORAL | 0 refills | Status: DC

## 2020-01-01 NOTE — Progress Notes (Unsigned)
Sent in cephalexin

## 2020-01-01 NOTE — Telephone Encounter (Signed)
Sent in keflex- see in office tomorrow

## 2020-01-01 NOTE — Telephone Encounter (Signed)
   SM   Kirsten Snyder Female, 86 y.o., 1934-02-26 MRN:  [id number] Phone:  [phone] Kirsten Snyder) PCP:  [name], MD Primary Cvg:  Aarp/Aarp Next Appt With Gastroenterology 02/12/2020 at 10:30 AM Message from Oneta Rack sent at 01/01/2020 2:59 PM EDT  Summary: Clinical Advice    Patient experiencing abdominal pain, patient states it hurts when urinating and the urine will just come in.         Call History   Type Contact Phone  01/01/2020 02:58 PM EDT Phone (Incoming) Marilla, Boddy (Self) [phone] (H)  User: Oneta Rack   Pt. Reports she started having pain, urgency and frequency with urination and back pain. Started this morning.States "I usually send a specimen over. I have a specimen cup here that Dr. [name] gave me." Declines appointment. Requests to send specimen. Please advise pt. Answer Assessment - Initial Assessment Questions 1. SYMPTOM: "What's the main symptom you're concerned about?" (e.g., frequency, incontinence)     Pain, hard to urinate 2. ONSET: "When did the  pain start?"     Last night 3. PAIN: "Is there any pain?" If Yes, ask: "How bad is it?" (Scale: 1-10; mild, moderate, severe)     Moderate-severe 4. CAUSE: "What do you think is causing the symptoms?"     UTI 5. OTHER SYMPTOMS: "Do you have any other symptoms?" (e.g., fever, flank pain, blood in urine, pain with urination)     Urgency, back pain 6. PREGNANCY: "Is there any chance you are pregnant?" "When was your last menstrual period?"     No  Protocols used: URINARY [facility]

## 2020-01-02 ENCOUNTER — Other Ambulatory Visit: Payer: Self-pay | Admitting: Family Medicine

## 2020-01-02 ENCOUNTER — Ambulatory Visit: Payer: PRIVATE HEALTH INSURANCE | Admitting: Family Medicine

## 2020-01-02 DIAGNOSIS — E7849 Other hyperlipidemia: Secondary | ICD-10-CM

## 2020-01-02 DIAGNOSIS — F5101 Primary insomnia: Secondary | ICD-10-CM

## 2020-01-02 DIAGNOSIS — E790 Hyperuricemia without signs of inflammatory arthritis and tophaceous disease: Secondary | ICD-10-CM

## 2020-01-02 MED ORDER — TRAZODONE HCL 50 MG PO TABS: ORAL_TABLET | ORAL | 1 refills | Status: DC

## 2020-01-02 MED ORDER — ALLOPURINOL 100 MG PO TABS: ORAL_TABLET | ORAL | 1 refills | Status: DC

## 2020-01-02 MED ORDER — LOVASTATIN 40 MG PO TABS: ORAL_TABLET | ORAL | 1 refills | Status: DC

## 2020-01-02 NOTE — Telephone Encounter (Signed)
Requested Prescriptions  Pending Prescriptions Disp Refills   traZODone (DESYREL) 50 MG tablet 90 tablet 1    Sig: TAKE (1) TABLET BY MOUTH DAILY AT BEDTIME     Psychiatry: Antidepressants - Serotonin Modulator Passed - 01/02/2020  3:35 PM      Passed - Completed PHQ-2 or PHQ-9 in the last 360 days.      Passed - Valid encounter within last 6 months    Recent Outpatient Visits          5 days ago Iron deficiency anemia, unspecified iron deficiency anemia type   [facility] [name], MD   1 week ago Malaise and fatigue   [facility] [name], MD   1 month ago Hospital discharge follow-up   [place], MD   3 months ago Fever and chills   [facility] [name], MD   8 months ago Essential hypertension   [place], MD      Future Appointments            Tomorrow [name], MD [facility]            lovastatin (MEVACOR) 40 MG tablet 90 tablet 1    Sig: TAKE (1) TABLET BY MOUTH DAILY AT BEDTIME     Cardiovascular:  Antilipid - Statins Failed - 01/02/2020  3:35 PM      Failed - Total Cholesterol in normal range and within 360 days    Cholesterol, Total  Date Value Ref Range Status  04/11/2019 227 (H) 100 - 199 mg/dL Final         Failed - LDL in normal range and within 360 days    LDL Chol Calc (NIH)  Date Value Ref Range Status  04/11/2019 140 (H) 0 - 99 mg/dL Final         Failed - Triglycerides in normal range and within 360 days    Triglycerides  Date Value Ref Range Status  04/11/2019 261 (H) 0 - 149 mg/dL Final         Passed - HDL in normal range and within 360 days    HDL  Date Value Ref Range Status  04/11/2019 40 >39 mg/dL Final         Passed - Patient is not pregnant      Passed - Valid encounter within last 12 months    Recent Outpatient Visits          5 days ago Iron deficiency anemia, unspecified iron deficiency  anemia type   [place], Deanna C, MD   1 week ago Malaise and fatigue   [facility] [name], MD   1 month ago Hospital discharge follow-up   [facility] [name], MD   3 months ago Fever and chills   [facility] [name], MD   8 months ago Essential hypertension   [facility] [name], MD      Future Appointments            Tomorrow [name], MD [facility]            allopurinol (ZYLOPRIM) 100 MG tablet 90 tablet 1    Sig: 1 tablet by mouth daily     Endocrinology:  Gout Agents Failed - 01/02/2020  3:35 PM      Failed -  Uric Acid in normal range and within 360 days    Uric Acid  Date Value Ref Range Status  07/26/2018 4.7 2.5 - 7.1 mg/dL Final    Comment:               Therapeutic target for gout patients: <6.0         Passed - Cr in normal range and within 360 days    Creatinine  Date Value Ref Range Status  10/19/2013 0.80 0.60 - 1.30 mg/dL Final   Creatinine, Ser  Date Value Ref Range Status  12/21/2019 0.76 0.44 - 1.00 mg/dL Final         Passed - Valid encounter within last 12 months    Recent Outpatient Visits          5 days ago Iron deficiency anemia, unspecified iron deficiency anemia type   [facility] [name], MD   1 week ago Malaise and fatigue   [facility] [name], MD   1 month ago Hospital discharge follow-up   [place], MD   3 months ago Fever and chills   [facility] [name], MD   8 months ago Essential hypertension   [place], MD      Future Appointments            Tomorrow [name], MD [facility]

## 2020-01-02 NOTE — Telephone Encounter (Signed)
Pt has new [facility] and they need new rxs lovastatin 40 mg, trazodone 50 mg and allopurinol  100 mg. Pt needs 90 day w/refills

## 2020-01-03 ENCOUNTER — Other Ambulatory Visit: Payer: Self-pay

## 2020-01-03 ENCOUNTER — Encounter: Payer: Self-pay | Admitting: Family Medicine

## 2020-01-03 ENCOUNTER — Ambulatory Visit (INDEPENDENT_AMBULATORY_CARE_PROVIDER_SITE_OTHER): Payer: Medicare HMO | Admitting: Family Medicine

## 2020-01-03 VITALS — BP 120/80 | HR 60 | Ht 64.0 in | Wt 230.0 lb

## 2020-01-03 DIAGNOSIS — B379 Candidiasis, unspecified: Secondary | ICD-10-CM | POA: Diagnosis not present

## 2020-01-03 DIAGNOSIS — N3090 Cystitis, unspecified without hematuria: Secondary | ICD-10-CM | POA: Diagnosis not present

## 2020-01-03 LAB — POCT URINALYSIS DIPSTICK
Bilirubin, UA: NEGATIVE
Blood, UA: NEGATIVE
Glucose, UA: NEGATIVE
Ketones, UA: NEGATIVE
Nitrite, UA: NEGATIVE
Protein, UA: NEGATIVE
Spec Grav, UA: 1.01 (ref 1.010–1.025)
Urobilinogen, UA: 0.2 E.U./dL
pH, UA: 5 (ref 5.0–8.0)

## 2020-01-03 MED ORDER — FLUCONAZOLE 150 MG PO TABS: 150.0000 mg | ORAL_TABLET | Freq: Once | ORAL | 0 refills | Status: AC

## 2020-01-03 MED ORDER — NYSTATIN 100000 UNIT/GM EX CREA: 1.0000 "application " | TOPICAL_CREAM | Freq: Two times a day (BID) | CUTANEOUS | 0 refills | Status: DC

## 2020-01-03 NOTE — Progress Notes (Signed)
Date:  01/03/2020   Name:  Kirsten Snyder   DOB:  [birth year]   MRN:  [id number]   Chief Complaint: Urinary Tract Infection (has had 2 days cephalexin- still doesn't feel right.. Nitrofurantoin is on culture from June)  Urinary Tract Infection  This is a new problem. The current episode started in the past 7 days. The problem occurs intermittently. The problem has been waxing and waning. The quality of the pain is described as burning. The pain is moderate. There has been no fever. Associated symptoms include frequency and urgency. Pertinent negatives include no chills, discharge, flank pain, hematuria, hesitancy, nausea, sweats or vomiting. She has tried antibiotics (cephalexin) for the symptoms. The treatment provided mild relief.    Lab Results  Component Value Date   CREATININE 0.76 12/21/2019   BUN 17 12/21/2019   NA 138 12/21/2019   K 4.0 12/21/2019   CL 102 12/21/2019   CO2 27 12/21/2019   Lab Results  Component Value Date   CHOL 227 (H) 04/11/2019   HDL 40 04/11/2019   LDLCALC 140 (H) 04/11/2019   TRIG 261 (H) 04/11/2019   CHOLHDL 4.8 (H) 11/02/2017   Lab Results  Component Value Date   TSH 2.39 09/15/2012   No results found for: HGBA1C Lab Results  Component Value Date   WBC 5.4 12/21/2019   HGB 12.6 12/21/2019   HCT 39.2 12/21/2019   MCV 93.6 12/21/2019   PLT 287 12/21/2019   Lab Results  Component Value Date   ALT 20 10/01/2019   AST 19 10/01/2019   ALKPHOS 59 10/01/2019   BILITOT 1.0 10/01/2019     Review of Systems  Constitutional: Negative.  Negative for chills, fatigue, fever and unexpected weight change.  HENT: Negative for congestion, ear discharge, ear pain, rhinorrhea, sinus pressure, sneezing and sore throat.   Eyes: Negative for photophobia, pain, discharge, redness and itching.  Respiratory: Negative for cough, shortness of breath, wheezing and stridor.   Gastrointestinal: Negative for abdominal pain, blood in stool, constipation,  diarrhea, nausea and vomiting.  Endocrine: Negative for cold intolerance, heat intolerance, polydipsia, polyphagia and polyuria.  Genitourinary: Positive for frequency and urgency. Negative for dysuria, flank pain, hematuria, hesitancy, menstrual problem, pelvic pain, vaginal bleeding and vaginal discharge.  Musculoskeletal: Negative for arthralgias, back pain and myalgias.  Skin: Negative for rash.  Allergic/Immunologic: Negative for environmental allergies and food allergies.  Neurological: Negative for dizziness, weakness, light-headedness, numbness and headaches.  Hematological: Negative for adenopathy. Does not bruise/bleed easily.  Psychiatric/Behavioral: Negative for dysphoric mood. The patient is not nervous/anxious.     Patient Active Problem List   Diagnosis Date Noted  . Sepsis secondary to UTI ([place]) 10/01/2019  . Sepsis ([place]) 10/01/2019  . Hypokalemia 10/01/2019  . Atherosclerosis of abdominal aorta ([place]) 10/12/2018  . SOBOE (shortness of breath on exertion) 10/12/2018  . Osteopenia of neck of right femur 06/29/2018  . Breast cancer, right ([place]) 06/17/2016  . Essential hypertension 12/13/2015  . Breast abscess of female 11/07/2015  . Familial multiple lipoprotein-type hyperlipidemia 10/25/2014  . Clinical depression 10/25/2014  . Acid reflux 10/25/2014  . Acute gastrointestinal bleeding 10/25/2014  . Aggrieved 10/25/2014  . Airway hyperreactivity 10/25/2014  . Arthritis of knee 10/25/2014  . H/O: gout 10/25/2014  . Adiposity 10/25/2014  . Arthritis, degenerative 10/25/2014  . H/O adenomatous polyp of colon 03/07/2014    Allergies  Allergen Reactions  . Sulfa Antibiotics     Past Surgical History:  Procedure Laterality Date  .  BREAST BIOPSY Bilateral    negative  . BREAST EXCISIONAL BIOPSY Right    right positive 04/2010  . BREAST LUMPECTOMY Right    removed a cancerous lump  . VAGINAL HYSTERECTOMY      Social History   Tobacco Use  . Smoking status:  Former Smoker    Packs/day: 0.25    Years: 2.00    Pack years: 0.50    Types: Cigarettes  . Smokeless tobacco: Never Used  . Tobacco comment: smoking cessation materials not required  Vaping Use  . Vaping Use: Never used  Substance Use Topics  . Alcohol use: No    Alcohol/week: 0.0 standard drinks  . Drug use: No     Medication list has been reviewed and updated.  Current Meds  Medication Sig  . albuterol (VENTOLIN HFA) 108 (90 Base) MCG/ACT inhaler Inhale 2 puffs into the lungs every 6 (six) hours as needed for wheezing or shortness of breath.  . ALLERGY RELIEF 10 MG tablet TAKE (1) TABLET BY MOUTH EVERY DAY  . allopurinol (ZYLOPRIM) 100 MG tablet 1 tablet by mouth daily  . apixaban (ELIQUIS) 5 MG TABS tablet Take 1 tablet (5 mg total) by mouth 2 (two) times daily.  . calcium carbonate (TUMS - DOSED IN MG ELEMENTAL CALCIUM) 500 MG chewable tablet Chew 1 tablet by mouth every 8 (eight) hours as needed for indigestion.  . cephALEXin (KEFLEX) 500 MG capsule Take 1 capsule (500 mg total) by mouth 3 (three) times daily.  . cetirizine (ZYRTEC) 10 MG tablet Take 10 mg by mouth daily. For allergies.  . ferrous sulfate 325 (65 FE) MG tablet Take 325 mg by mouth daily with breakfast.  . Fluticasone-Umeclidin-Vilant (TRELEGY ELLIPTA) 100-62.5-25 MCG/INH AEPB Inhale 1 puff into the lungs daily.  Marland Kitchen lovastatin (MEVACOR) 40 MG tablet TAKE (1) TABLET BY MOUTH DAILY AT BEDTIME  . omeprazole (PRILOSEC) 20 MG capsule TAKE (1) CAPSULE BY MOUTH EVERY DAY  . sertraline (ZOLOFT) 50 MG tablet TAKE (1) TABLET BY MOUTH EVERY DAY  . tobramycin-dexamethasone (TOBRADEX) ophthalmic solution Place 2 drops into both eyes every 4 (four) hours while awake.  . traZODone (DESYREL) 50 MG tablet TAKE (1) TABLET BY MOUTH DAILY AT BEDTIME    PHQ 2/9 Scores 12/21/2019 11/14/2019 09/14/2019 05/10/2019  PHQ - 2 Score 2 0 0 0  PHQ- 9 Score 5 0 2 -    GAD 7 : Generalized Anxiety Score 12/21/2019 11/14/2019 09/14/2019 04/11/2019   Nervous, Anxious, on Edge 3 0 1 0  Control/stop worrying 3 0 1 0  Worry too much - different things 3 0 1 0  Trouble relaxing 1 0 1 0  Restless 0 0 0 0  Easily annoyed or irritable 0 0 0 0  Afraid - awful might happen 0 0 0 0  Total GAD 7 Score 10 0 4 0  Anxiety Difficulty Not difficult at all Not difficult at all Not difficult at all -    BP Readings from Last 3 Encounters:  01/03/20 120/80  12/28/19 120/80  12/21/19 130/80    Physical Exam Constitutional:      Appearance: She is well-developed.  HENT:     Head: Normocephalic.     Right Ear: External ear normal.     Left Ear: External ear normal.  Eyes:     General: Lids are everted, no foreign bodies appreciated. No scleral icterus.       Left eye: No foreign body or hordeolum.     Conjunctiva/sclera: Conjunctivae  normal.     Right eye: Right conjunctiva is not injected.     Left eye: Left conjunctiva is not injected.     Pupils: Pupils are equal, round, and reactive to light.  Neck:     Thyroid: No thyromegaly.     Vascular: No JVD.     Trachea: No tracheal deviation.  Cardiovascular:     Rate and Rhythm: Normal rate and regular rhythm.     Heart sounds: Normal heart sounds. No murmur heard.  No friction rub. No gallop.   Pulmonary:     Effort: Pulmonary effort is normal. No respiratory distress.     Breath sounds: Normal breath sounds. No wheezing or rales.  Abdominal:     General: Bowel sounds are normal.     Palpations: Abdomen is soft. There is no mass.     Tenderness: There is no abdominal tenderness. There is no right CVA tenderness, left CVA tenderness, guarding or rebound.  Musculoskeletal:        General: No tenderness. Normal range of motion.     Cervical back: Normal range of motion and neck supple.  Lymphadenopathy:     Cervical: No cervical adenopathy.  Skin:    General: Skin is warm.     Findings: No rash.  Neurological:     Mental Status: She is alert and oriented to person, place, and time.      Cranial Nerves: No cranial nerve deficit.     Deep Tendon Reflexes: Reflexes normal.  Psychiatric:        Mood and Affect: Mood is not anxious or depressed.     Wt Readings from Last 3 Encounters:  01/03/20 230 lb (104.3 kg)  12/28/19 231 lb (104.8 kg)  12/21/19 227 lb (103 kg)    BP 120/80   Pulse 60   Ht 5\' 4"  (1.626 m)   Wt 230 lb (104.3 kg)   BMI 39.48 kg/m   Assessment and Plan:  1. Cystitis New onset.  Recurrent.  Symptomatic.  Patient is having symptoms again but I am not certain that it is fully a cystitis but we started antibiotics earlier to she could get here in the urinalysis shows trace leukocytes which may reflect the antibiotic currently we will send a culture in the event that we are have a resistant organism.  In the meantime continue the Keflex 500 mg 3 times a day. - POCT urinalysis dipstick - Urine Culture  2. Candidiasis New onset.  Patient feels an area that is symptomatic that is consistent with a possible candidiasis.  Patient been given nystatin cream to use on this and at the end of her antibiotic treatment will take 1 Diflucan tablet. - nystatin cream (MYCOSTATIN); Apply 1 application topically 2 (two) times daily.  Dispense: 30 g; Refill: 0 - fluconazole (DIFLUCAN) 150 MG tablet; Take 1 tablet (150 mg total) by mouth once for 1 dose.  Dispense: 1 tablet; Refill: 0

## 2020-01-04 ENCOUNTER — Telehealth: Payer: Self-pay | Admitting: Family Medicine

## 2020-01-04 NOTE — Telephone Encounter (Signed)
Home Health Verbal Orders - Caller/Agency: Jeff/ [facility]  Callback Number: [phone] secure VM Requesting extension of PT Frequency: Beginning the week of 9.16.21 1x a week for 2 weeks

## 2020-01-04 NOTE — Telephone Encounter (Signed)
Left message on machine- ok to proceed

## 2020-01-05 ENCOUNTER — Other Ambulatory Visit: Payer: Self-pay | Admitting: Family Medicine

## 2020-01-05 DIAGNOSIS — E790 Hyperuricemia without signs of inflammatory arthritis and tophaceous disease: Secondary | ICD-10-CM

## 2020-01-05 LAB — SPECIMEN STATUS REPORT

## 2020-01-05 LAB — URINE CULTURE

## 2020-01-11 DIAGNOSIS — I82432 Acute embolism and thrombosis of left popliteal vein: Secondary | ICD-10-CM | POA: Diagnosis not present

## 2020-01-11 DIAGNOSIS — Z483 Aftercare following surgery for neoplasm: Secondary | ICD-10-CM | POA: Diagnosis not present

## 2020-01-11 DIAGNOSIS — I2694 Multiple subsegmental pulmonary emboli without acute cor pulmonale: Secondary | ICD-10-CM | POA: Diagnosis not present

## 2020-01-11 DIAGNOSIS — J449 Chronic obstructive pulmonary disease, unspecified: Secondary | ICD-10-CM | POA: Diagnosis not present

## 2020-01-11 DIAGNOSIS — C700 Malignant neoplasm of cerebral meninges: Secondary | ICD-10-CM | POA: Diagnosis not present

## 2020-01-11 DIAGNOSIS — I10 Essential (primary) hypertension: Secondary | ICD-10-CM | POA: Diagnosis not present

## 2020-01-11 DIAGNOSIS — D509 Iron deficiency anemia, unspecified: Secondary | ICD-10-CM | POA: Diagnosis not present

## 2020-01-11 DIAGNOSIS — J4520 Mild intermittent asthma, uncomplicated: Secondary | ICD-10-CM | POA: Diagnosis not present

## 2020-01-11 DIAGNOSIS — E7849 Other hyperlipidemia: Secondary | ICD-10-CM | POA: Diagnosis not present

## 2020-01-18 DIAGNOSIS — J449 Chronic obstructive pulmonary disease, unspecified: Secondary | ICD-10-CM | POA: Diagnosis not present

## 2020-01-18 DIAGNOSIS — E785 Hyperlipidemia, unspecified: Secondary | ICD-10-CM | POA: Diagnosis not present

## 2020-01-18 DIAGNOSIS — Z882 Allergy status to sulfonamides status: Secondary | ICD-10-CM | POA: Diagnosis not present

## 2020-01-18 DIAGNOSIS — I10 Essential (primary) hypertension: Secondary | ICD-10-CM | POA: Diagnosis not present

## 2020-01-18 DIAGNOSIS — D329 Benign neoplasm of meninges, unspecified: Secondary | ICD-10-CM | POA: Diagnosis not present

## 2020-02-01 ENCOUNTER — Encounter: Payer: Self-pay | Admitting: *Deleted

## 2020-02-09 ENCOUNTER — Telehealth: Payer: Self-pay

## 2020-02-09 ENCOUNTER — Other Ambulatory Visit: Payer: Self-pay

## 2020-02-09 DIAGNOSIS — R42 Dizziness and giddiness: Secondary | ICD-10-CM

## 2020-02-09 MED ORDER — MECLIZINE HCL 12.5 MG PO TABS: 12.5000 mg | ORAL_TABLET | Freq: Three times a day (TID) | ORAL | 0 refills | Status: DC | PRN

## 2020-02-09 NOTE — Telephone Encounter (Signed)
Copied from [place] [phone]. Topic: General - Other >> Feb 09, 2020 10:01 AM Rainey Pines A wrote: Patient is requesting a callback from [name] in regards to "not feeling well and her medication." Patient did not wish to go into detail . Please advise

## 2020-02-09 NOTE — Progress Notes (Signed)
Sent in Meclizine due to pt calling and stating she is having episodes of dizziness

## 2020-02-09 NOTE — Telephone Encounter (Signed)
Sent in [place] for pt- also called her and [name] and instructed them both for her to use her walker, as well as if she gets worse- go to hospital for further eval. [name] stated she just had MRI and it looked great. However, with the symptoms that she is having- her brain tumor started this way. No need to take a chance with this.

## 2020-02-12 ENCOUNTER — Ambulatory Visit: Payer: PRIVATE HEALTH INSURANCE | Admitting: Gastroenterology

## 2020-02-22 ENCOUNTER — Other Ambulatory Visit: Payer: Self-pay | Admitting: Family Medicine

## 2020-02-22 DIAGNOSIS — I2699 Other pulmonary embolism without acute cor pulmonale: Secondary | ICD-10-CM

## 2020-02-22 NOTE — Telephone Encounter (Signed)
Patient called and advised that Dr. [name] noted to return for a follow up for the blood clots and the medication Eliquis. She says to call her son [name] to schedule. [name] called and advised above. Appointment scheduled for tomorrow, 02/23/20 at 1000 with Dr. [name].

## 2020-02-22 NOTE — Telephone Encounter (Signed)
Requested medication (s) are due for refill today: Yes  Requested medication (s) are on the active medication list: Yes  Last refill:  11/14/19  Future visit scheduled: Yes  Notes to clinic:  Noted for follow up to evaluate the continued need, routing to office, appointment tomorrow.     Requested Prescriptions  Pending Prescriptions Disp Refills   ELIQUIS 5 MG TABS tablet [Pharmacy Med Name: ELIQUIS 5 MG TAB] 60 tablet 2    Sig: TAKE (1) TABLET BY MOUTH TWICE DAILY      Hematology:  Anticoagulants Passed - 02/22/2020 12:11 PM      Passed - HGB in normal range and within 360 days    Hemoglobin  Date Value Ref Range Status  12/21/2019 12.6 12.0 - 15.0 g/dL Final   HGB  Date Value Ref Range Status  10/19/2013 12.8 12.0 - 16.0 g/dL Final          Passed - PLT in normal range and within 360 days    Platelets  Date Value Ref Range Status  12/21/2019 287 150 - 400 K/uL Final   Platelet  Date Value Ref Range Status  10/19/2013 301 150 - 440 x10 3/mm  Final          Passed - HCT in normal range and within 360 days    HCT  Date Value Ref Range Status  12/21/2019 39.2 36 - 46 % Final  10/19/2013 38.1 35.0 - 47.0 % Final          Passed - Cr in normal range and within 360 days    Creatinine  Date Value Ref Range Status  10/19/2013 0.80 0.60 - 1.30 mg/dL Final   Creatinine, Ser  Date Value Ref Range Status  12/21/2019 0.76 0.44 - 1.00 mg/dL Final          Passed - Valid encounter within last 12 months    Recent Outpatient Visits           1 month ago Cystitis   [facility] [name], MD   1 month ago Iron deficiency anemia, unspecified iron deficiency anemia type   [facility] [name], MD   2 months ago Malaise and fatigue   [facility] [name], MD   3 months ago Hospital discharge follow-up   [place], MD   5 months ago Fever and chills   [facility] [name],  MD       Future Appointments             Tomorrow [name], MD [facility]

## 2020-02-23 ENCOUNTER — Encounter: Payer: Self-pay | Admitting: Family Medicine

## 2020-02-23 ENCOUNTER — Ambulatory Visit (INDEPENDENT_AMBULATORY_CARE_PROVIDER_SITE_OTHER): Payer: Medicare HMO | Admitting: Family Medicine

## 2020-02-23 ENCOUNTER — Other Ambulatory Visit: Payer: Self-pay

## 2020-02-23 VITALS — BP 120/70 | HR 64 | Ht 64.0 in | Wt 213.0 lb

## 2020-02-23 DIAGNOSIS — J4520 Mild intermittent asthma, uncomplicated: Secondary | ICD-10-CM | POA: Diagnosis not present

## 2020-02-23 DIAGNOSIS — E7849 Other hyperlipidemia: Secondary | ICD-10-CM | POA: Diagnosis not present

## 2020-02-23 DIAGNOSIS — F5101 Primary insomnia: Secondary | ICD-10-CM | POA: Diagnosis not present

## 2020-02-23 DIAGNOSIS — E790 Hyperuricemia without signs of inflammatory arthritis and tophaceous disease: Secondary | ICD-10-CM

## 2020-02-23 DIAGNOSIS — F3341 Major depressive disorder, recurrent, in partial remission: Secondary | ICD-10-CM | POA: Diagnosis not present

## 2020-02-23 DIAGNOSIS — K219 Gastro-esophageal reflux disease without esophagitis: Secondary | ICD-10-CM

## 2020-02-23 DIAGNOSIS — Z86711 Personal history of pulmonary embolism: Secondary | ICD-10-CM

## 2020-02-23 MED ORDER — ALBUTEROL SULFATE HFA 108 (90 BASE) MCG/ACT IN AERS: 2.0000 | INHALATION_SPRAY | Freq: Four times a day (QID) | RESPIRATORY_TRACT | 1 refills | Status: DC | PRN

## 2020-02-23 MED ORDER — TRAZODONE HCL 50 MG PO TABS: ORAL_TABLET | ORAL | 1 refills | Status: DC

## 2020-02-23 MED ORDER — SERTRALINE HCL 50 MG PO TABS: ORAL_TABLET | ORAL | 0 refills | Status: DC

## 2020-02-23 MED ORDER — ALLOPURINOL 100 MG PO TABS: ORAL_TABLET | ORAL | 0 refills | Status: DC

## 2020-02-23 MED ORDER — APIXABAN 5 MG PO TABS: ORAL_TABLET | ORAL | 0 refills | Status: DC

## 2020-02-23 MED ORDER — LOVASTATIN 40 MG PO TABS: ORAL_TABLET | ORAL | 1 refills | Status: DC

## 2020-02-23 MED ORDER — OMEPRAZOLE 20 MG PO CPDR: DELAYED_RELEASE_CAPSULE | ORAL | 0 refills | Status: DC

## 2020-02-23 NOTE — Progress Notes (Signed)
Date:  02/23/2020   Name:  Kirsten Snyder   DOB:  [birth year]   MRN:  [id number]   Chief Complaint: Gout, Allergic Rhinitis , Hyperlipidemia, pulmonary embolism, Insomnia, and COPD  Hyperlipidemia This is a chronic problem. The current episode started more than 1 year ago. The problem is controlled. Recent lipid tests were reviewed and are normal. She has no history of chronic renal disease, diabetes, hypothyroidism, liver disease, obesity or nephrotic syndrome. There are no known factors aggravating her hyperlipidemia. Pertinent negatives include no chest pain, focal sensory loss, focal weakness, leg pain, myalgias or shortness of breath. Current antihyperlipidemic treatment includes statins. The current treatment provides moderate improvement of lipids. Risk factors for coronary artery disease include dyslipidemia and hypertension.  Insomnia Primary symptoms: no fragmented sleep, no malaise/fatigue.  The onset quality is gradual. The problem has been waxing and waning since onset. Exacerbated by: overactive bladder. The treatment provided moderate relief.  COPD There is no chest tightness, cough, difficulty breathing, frequent throat clearing, hemoptysis, hoarse voice, shortness of breath, sputum production or wheezing. This is a chronic problem. The problem has been gradually improving. Associated symptoms include ear congestion and rhinorrhea. Pertinent negatives include no chest pain, ear pain, fever, headaches, heartburn, malaise/fatigue, myalgias, nasal congestion, sneezing, sore throat or sweats. Her past medical history is significant for COPD.  URI  This is a chronic problem. The current episode started more than 1 year ago. The problem has been gradually improving. Associated symptoms include congestion and rhinorrhea. Pertinent negatives include no abdominal pain, chest pain, coughing, diarrhea, dysuria, ear pain, headaches, joint pain, joint swelling, nausea, rash, sneezing, sore  throat, vomiting or wheezing. She has tried nothing for the symptoms. The treatment provided moderate relief.    Lab Results  Component Value Date   CREATININE 0.76 12/21/2019   BUN 17 12/21/2019   NA 138 12/21/2019   K 4.0 12/21/2019   CL 102 12/21/2019   CO2 27 12/21/2019   Lab Results  Component Value Date   CHOL 227 (H) 04/11/2019   HDL 40 04/11/2019   LDLCALC 140 (H) 04/11/2019   TRIG 261 (H) 04/11/2019   CHOLHDL 4.8 (H) 11/02/2017   Lab Results  Component Value Date   TSH 2.39 09/15/2012   No results found for: HGBA1C Lab Results  Component Value Date   WBC 5.4 12/21/2019   HGB 12.6 12/21/2019   HCT 39.2 12/21/2019   MCV 93.6 12/21/2019   PLT 287 12/21/2019   Lab Results  Component Value Date   ALT 20 10/01/2019   AST 19 10/01/2019   ALKPHOS 59 10/01/2019   BILITOT 1.0 10/01/2019     Review of Systems  Constitutional: Negative.  Negative for chills, fatigue, fever, malaise/fatigue and unexpected weight change.  HENT: Positive for congestion and rhinorrhea. Negative for ear discharge, ear pain, hoarse voice, sinus pressure, sneezing and sore throat.   Eyes: Negative for photophobia, pain, discharge, redness and itching.  Respiratory: Negative for cough, hemoptysis, sputum production, shortness of breath, wheezing and stridor.   Cardiovascular: Negative for chest pain.  Gastrointestinal: Negative for abdominal pain, blood in stool, constipation, diarrhea, heartburn, nausea and vomiting.  Endocrine: Negative for cold intolerance, heat intolerance, polydipsia, polyphagia and polyuria.  Genitourinary: Negative for dysuria, flank pain, frequency, hematuria, menstrual problem, pelvic pain, urgency, vaginal bleeding and vaginal discharge.  Musculoskeletal: Negative for arthralgias, back pain, joint pain and myalgias.  Skin: Negative for rash.  Allergic/Immunologic: Negative for environmental allergies and food allergies.  Neurological: Negative for dizziness, focal  weakness, weakness, light-headedness, numbness and headaches.  Hematological: Negative for adenopathy. Does not bruise/bleed easily.  Psychiatric/Behavioral: Negative for dysphoric mood. The patient has insomnia. The patient is not nervous/anxious.     Patient Active Problem List   Diagnosis Date Noted   Sepsis secondary to UTI ([place]) 10/01/2019   Sepsis ([place]) 10/01/2019   Hypokalemia 10/01/2019   Atherosclerosis of abdominal aorta ([place]) 10/12/2018   SOBOE (shortness of breath on exertion) 10/12/2018   Osteopenia of neck of right femur 06/29/2018   Breast cancer, right ([place]) 06/17/2016   Essential hypertension 12/13/2015   Breast abscess of female 11/07/2015   Familial multiple lipoprotein-type hyperlipidemia 10/25/2014   Clinical depression 10/25/2014   Acid reflux 10/25/2014   Acute gastrointestinal bleeding 10/25/2014   Aggrieved 10/25/2014   Airway hyperreactivity 10/25/2014   Arthritis of knee 10/25/2014   H/O: gout 10/25/2014   Adiposity 10/25/2014   Arthritis, degenerative 10/25/2014   H/O adenomatous polyp of colon 03/07/2014    Allergies  Allergen Reactions   Sulfa Antibiotics     Past Surgical History:  Procedure Laterality Date   BREAST BIOPSY Bilateral    negative   BREAST EXCISIONAL BIOPSY Right    right positive 04/2010   BREAST LUMPECTOMY Right    removed a cancerous lump   VAGINAL HYSTERECTOMY      Social History   Tobacco Use   Smoking status: Former Smoker    Packs/day: 0.25    Years: 2.00    Pack years: 0.50    Types: Cigarettes   Smokeless tobacco: Never Used   Tobacco comment: smoking cessation materials not required  Vaping Use   Vaping Use: Never used  Substance Use Topics   Alcohol use: No    Alcohol/week: 0.0 standard drinks   Drug use: No     Medication list has been reviewed and updated.  Current Meds  Medication Sig   albuterol (VENTOLIN HFA) 108 (90 Base) MCG/ACT inhaler Inhale 2 puffs into  the lungs every 6 (six) hours as needed for wheezing or shortness of breath.   ALLERGY RELIEF 10 MG tablet TAKE (1) TABLET BY MOUTH EVERY DAY   allopurinol (ZYLOPRIM) 100 MG tablet TAKE (1) TABLET BY MOUTH EVERY DAY   calcium carbonate (TUMS - DOSED IN MG ELEMENTAL CALCIUM) 500 MG chewable tablet Chew 1 tablet by mouth every 8 (eight) hours as needed for indigestion.   cephALEXin (KEFLEX) 500 MG capsule Take 1 capsule (500 mg total) by mouth 3 (three) times daily.   cetirizine (ZYRTEC) 10 MG tablet Take 10 mg by mouth daily. For allergies.   ELIQUIS 5 MG TABS tablet TAKE (1) TABLET BY MOUTH TWICE DAILY   ferrous sulfate 325 (65 FE) MG tablet Take 325 mg by mouth daily with breakfast.   Fluticasone-Umeclidin-Vilant (TRELEGY ELLIPTA) 100-62.5-25 MCG/INH AEPB Inhale 1 puff into the lungs daily.   lovastatin (MEVACOR) 40 MG tablet TAKE (1) TABLET BY MOUTH DAILY AT BEDTIME   meclizine (ANTIVERT) 12.5 MG tablet Take 1 tablet (12.5 mg total) by mouth 3 (three) times daily as needed for dizziness.   nystatin cream (MYCOSTATIN) Apply 1 application topically 2 (two) times daily.   omeprazole (PRILOSEC) 20 MG capsule TAKE (1) CAPSULE BY MOUTH EVERY DAY   sertraline (ZOLOFT) 50 MG tablet TAKE (1) TABLET BY MOUTH EVERY DAY   traZODone (DESYREL) 50 MG tablet TAKE (1) TABLET BY MOUTH DAILY AT BEDTIME    PHQ 2/9 Scores 02/23/2020 12/21/2019 11/14/2019 09/14/2019  PHQ - 2 Score 2 2 0 0  PHQ- 9 Score 9 5 0 2    GAD 7 : Generalized Anxiety Score 02/23/2020 12/21/2019 11/14/2019 09/14/2019  Nervous, Anxious, on Edge 0 3 0 1  Control/stop worrying 0 3 0 1  Worry too much - different things 0 3 0 1  Trouble relaxing 0 1 0 1  Restless 0 0 0 0  Easily annoyed or irritable 0 0 0 0  Afraid - awful might happen 0 0 0 0  Total GAD 7 Score 0 10 0 4  Anxiety Difficulty Not difficult at all Not difficult at all Not difficult at all Not difficult at all    BP Readings from Last 3 Encounters:  02/23/20 120/70   01/03/20 120/80  12/28/19 120/80    Physical Exam Vitals reviewed.  Constitutional:      Appearance: She is well-developed.  HENT:     Head: Normocephalic.     Right Ear: Tympanic membrane, ear canal and external ear normal.     Left Ear: Tympanic membrane, ear canal and external ear normal.     Nose: Nose normal.     Mouth/Throat:     Mouth: Mucous membranes are moist.     Pharynx: No oropharyngeal exudate or posterior oropharyngeal erythema.  Eyes:     General: Lids are everted, no foreign bodies appreciated. No scleral icterus.       Right eye: No discharge.        Left eye: No foreign body, discharge or hordeolum.     Conjunctiva/sclera: Conjunctivae normal.     Right eye: Right conjunctiva is not injected.     Left eye: Left conjunctiva is not injected.     Pupils: Pupils are equal, round, and reactive to light.  Neck:     Thyroid: No thyromegaly.     Vascular: No JVD.     Trachea: No tracheal deviation.  Cardiovascular:     Rate and Rhythm: Normal rate and regular rhythm.     Heart sounds: Normal heart sounds. No murmur heard.  No friction rub. No gallop.   Pulmonary:     Effort: Pulmonary effort is normal. No respiratory distress.     Breath sounds: Normal breath sounds. No stridor. No wheezing, rhonchi or rales.  Chest:     Chest wall: No tenderness.  Abdominal:     General: Bowel sounds are normal. There is no distension.     Palpations: Abdomen is soft. There is no mass.     Tenderness: There is no abdominal tenderness. There is no right CVA tenderness, left CVA tenderness, guarding or rebound.     Hernia: No hernia is present.  Musculoskeletal:        General: No tenderness. Normal range of motion.     Cervical back: Normal range of motion and neck supple.  Lymphadenopathy:     Cervical: No cervical adenopathy.  Skin:    General: Skin is warm.     Capillary Refill: Capillary refill takes less than 2 seconds.     Findings: No rash.  Neurological:      Mental Status: She is alert and oriented to person, place, and time.     Cranial Nerves: No cranial nerve deficit.     Deep Tendon Reflexes: Reflexes normal.  Psychiatric:        Mood and Affect: Mood is not anxious or depressed.     Wt Readings from Last 3 Encounters:  02/23/20 213 lb (96.6  kg)  01/03/20 230 lb (104.3 kg)  12/28/19 231 lb (104.8 kg)    BP 120/70    Pulse 64    Ht 5\' 4"  (1.626 m)    Wt 213 lb (96.6 kg)    BMI 36.56 kg/m   Assessment and Plan: 1. Familial multiple lipoprotein-type hyperlipidemia Chronic. Controlled. Stable. Continue lovastatin 40 mg nightly. Will check lipid panel for current status of control. - lovastatin (MEVACOR) 40 MG tablet; TAKE (1) TABLET BY MOUTH DAILY AT BEDTIME  Dispense: 90 tablet; Refill: 1 - Lipid Panel With LDL/HDL Ratio  2. History of pulmonary embolism Chronic. Controlled. Stable. Patient will continue Eliquis 5 mg twice a day. - apixaban (ELIQUIS) 5 MG TABS tablet; TAKE (1) TABLET BY MOUTH TWICE DAILY  Dispense: 60 tablet; Refill: 0  3. Gastroesophageal reflux disease, unspecified whether esophagitis present Chronic. Controlled. Stable. Continue omeprazole 20 mg once a day. - omeprazole (PRILOSEC) 20 MG capsule; TAKE (1) CAPSULE BY MOUTH EVERY DAY  Dispense: 90 capsule; Refill: 0  4. Recurrent major depressive disorder, in partial remission (HCC) Chronic. Controlled. Stable. PHQ is two Gad score is four we will continue sertraline 50 mg daily. - sertraline (ZOLOFT) 50 MG tablet; TAKE (1) TABLET BY MOUTH EVERY DAY  Dispense: 90 tablet; Refill: 0  5. Primary insomnia Chronic. Controlled. Stable. Patient has some episodes of initiating sleep for the most part she is able to get to sleep eventually. We will continue trazodone 50 mg nightly. - traZODone (DESYREL) 50 MG tablet; TAKE (1) TABLET BY MOUTH DAILY AT BEDTIME  Dispense: 90 tablet; Refill: 1  6. Mild intermittent asthma without complication Chronic. Controlled. Stable. Will  use albuterol inhaler on as needed basis will likely be able to decrease since we had a freeze warning this morning. - albuterol (VENTOLIN HFA) 108 (90 Base) MCG/ACT inhaler; Inhale 2 puffs into the lungs every 6 (six) hours as needed for wheezing or shortness of breath.  Dispense: 18 g; Refill: 1  7. Hyperuricemia Chronic. Controlled. Stable. Continue allopurinol at current dosing. - allopurinol (ZYLOPRIM) 100 MG tablet; TAKE (1) TABLET BY MOUTH EVERY DAY  Dispense: 90 tablet; Refill: 0

## 2020-02-24 LAB — LIPID PANEL WITH LDL/HDL RATIO
Cholesterol, Total: 219 mg/dL — ABNORMAL HIGH (ref 100–199)
HDL: 41 mg/dL (ref >39)
LDL Chol Calc (NIH): 141 mg/dL — ABNORMAL HIGH (ref 0–99)
LDL/HDL Ratio: 3.4 ratio — ABNORMAL HIGH (ref 0.0–3.2)
Triglycerides: 207 mg/dL — ABNORMAL HIGH (ref 0–149)
VLDL Cholesterol Cal: 37 mg/dL (ref 5–40)

## 2020-02-26 ENCOUNTER — Other Ambulatory Visit: Payer: Self-pay

## 2020-02-26 ENCOUNTER — Telehealth: Payer: Self-pay

## 2020-02-26 DIAGNOSIS — N3090 Cystitis, unspecified without hematuria: Secondary | ICD-10-CM

## 2020-02-26 MED ORDER — CEPHALEXIN 500 MG PO CAPS: 500.0000 mg | ORAL_CAPSULE | Freq: Three times a day (TID) | ORAL | 0 refills | Status: DC

## 2020-02-26 NOTE — Telephone Encounter (Signed)
Copied from [place] [id number]. Topic: Quick Communication - Lab Results (Clinic Use ONLY) >> Feb 26, 2020 12:14 PM Fredderick Severance wrote: Called patient to inform them of acceptable lab results. When patient returns call, triage nurse may disclose results. >> Feb 26, 2020 12:18 PM Erick Blinks wrote: Pt declined offer to speak with triage, prefers to speak with [name] specifically. Please advise

## 2020-02-26 NOTE — Progress Notes (Unsigned)
Sent in [place] to [facility]

## 2020-02-27 ENCOUNTER — Emergency Department: Payer: Medicare HMO

## 2020-02-27 ENCOUNTER — Inpatient Hospital Stay
Admission: EM | Admit: 2020-02-27 | Discharge: 2020-03-01 | DRG: 690 | Disposition: A | Discharge status: Skilled Nursing Facility | Payer: Medicare HMO | Attending: Internal Medicine | Admitting: Internal Medicine

## 2020-02-27 ENCOUNTER — Other Ambulatory Visit: Payer: Self-pay

## 2020-02-27 DIAGNOSIS — R531 Weakness: Secondary | ICD-10-CM | POA: Diagnosis not present

## 2020-02-27 DIAGNOSIS — N3000 Acute cystitis without hematuria: Secondary | ICD-10-CM | POA: Diagnosis not present

## 2020-02-27 DIAGNOSIS — Z86718 Personal history of other venous thrombosis and embolism: Secondary | ICD-10-CM | POA: Diagnosis not present

## 2020-02-27 DIAGNOSIS — Z86711 Personal history of pulmonary embolism: Secondary | ICD-10-CM

## 2020-02-27 DIAGNOSIS — Z923 Personal history of irradiation: Secondary | ICD-10-CM | POA: Diagnosis not present

## 2020-02-27 DIAGNOSIS — K219 Gastro-esophageal reflux disease without esophagitis: Secondary | ICD-10-CM | POA: Diagnosis not present

## 2020-02-27 DIAGNOSIS — M1611 Unilateral primary osteoarthritis, right hip: Secondary | ICD-10-CM | POA: Diagnosis not present

## 2020-02-27 DIAGNOSIS — R627 Adult failure to thrive: Secondary | ICD-10-CM | POA: Diagnosis present

## 2020-02-27 DIAGNOSIS — W19XXXA Unspecified fall, initial encounter: Principal | ICD-10-CM

## 2020-02-27 DIAGNOSIS — R52 Pain, unspecified: Secondary | ICD-10-CM | POA: Diagnosis not present

## 2020-02-27 DIAGNOSIS — J45909 Unspecified asthma, uncomplicated: Secondary | ICD-10-CM | POA: Diagnosis not present

## 2020-02-27 DIAGNOSIS — Z806 Family history of leukemia: Secondary | ICD-10-CM

## 2020-02-27 DIAGNOSIS — Y92009 Unspecified place in unspecified non-institutional (private) residence as the place of occurrence of the external cause: Secondary | ICD-10-CM

## 2020-02-27 DIAGNOSIS — M109 Gout, unspecified: Secondary | ICD-10-CM | POA: Diagnosis present

## 2020-02-27 DIAGNOSIS — M25551 Pain in right hip: Secondary | ICD-10-CM | POA: Diagnosis not present

## 2020-02-27 DIAGNOSIS — M47819 Spondylosis without myelopathy or radiculopathy, site unspecified: Secondary | ICD-10-CM | POA: Diagnosis present

## 2020-02-27 DIAGNOSIS — R600 Localized edema: Secondary | ICD-10-CM | POA: Diagnosis not present

## 2020-02-27 DIAGNOSIS — Z7901 Long term (current) use of anticoagulants: Secondary | ICD-10-CM | POA: Diagnosis not present

## 2020-02-27 DIAGNOSIS — I10 Essential (primary) hypertension: Secondary | ICD-10-CM | POA: Diagnosis present

## 2020-02-27 DIAGNOSIS — I700 Atherosclerosis of aorta: Secondary | ICD-10-CM | POA: Diagnosis present

## 2020-02-27 DIAGNOSIS — Z853 Personal history of malignant neoplasm of breast: Secondary | ICD-10-CM

## 2020-02-27 DIAGNOSIS — Y92019 Unspecified place in single-family (private) house as the place of occurrence of the external cause: Secondary | ICD-10-CM | POA: Diagnosis not present

## 2020-02-27 DIAGNOSIS — F32A Depression, unspecified: Secondary | ICD-10-CM | POA: Diagnosis present

## 2020-02-27 DIAGNOSIS — Z79899 Other long term (current) drug therapy: Secondary | ICD-10-CM

## 2020-02-27 DIAGNOSIS — Z8249 Family history of ischemic heart disease and other diseases of the circulatory system: Secondary | ICD-10-CM | POA: Diagnosis not present

## 2020-02-27 DIAGNOSIS — N390 Urinary tract infection, site not specified: Secondary | ICD-10-CM | POA: Diagnosis not present

## 2020-02-27 DIAGNOSIS — S76811A Strain of other specified muscles, fascia and tendons at thigh level, right thigh, initial encounter: Secondary | ICD-10-CM | POA: Diagnosis present

## 2020-02-27 DIAGNOSIS — W010XXA Fall on same level from slipping, tripping and stumbling without subsequent striking against object, initial encounter: Secondary | ICD-10-CM | POA: Diagnosis present

## 2020-02-27 DIAGNOSIS — R279 Unspecified lack of coordination: Secondary | ICD-10-CM | POA: Diagnosis not present

## 2020-02-27 DIAGNOSIS — Z6839 Body mass index (BMI) 39.0-39.9, adult: Secondary | ICD-10-CM | POA: Diagnosis not present

## 2020-02-27 DIAGNOSIS — R1012 Left upper quadrant pain: Secondary | ICD-10-CM | POA: Diagnosis not present

## 2020-02-27 DIAGNOSIS — E785 Hyperlipidemia, unspecified: Secondary | ICD-10-CM | POA: Diagnosis not present

## 2020-02-27 DIAGNOSIS — Z87891 Personal history of nicotine dependence: Secondary | ICD-10-CM

## 2020-02-27 DIAGNOSIS — S76311A Strain of muscle, fascia and tendon of the posterior muscle group at thigh level, right thigh, initial encounter: Secondary | ICD-10-CM | POA: Diagnosis not present

## 2020-02-27 DIAGNOSIS — M545 Low back pain, unspecified: Secondary | ICD-10-CM | POA: Diagnosis not present

## 2020-02-27 DIAGNOSIS — Z20822 Contact with and (suspected) exposure to covid-19: Secondary | ICD-10-CM | POA: Diagnosis present

## 2020-02-27 DIAGNOSIS — R5381 Other malaise: Secondary | ICD-10-CM | POA: Diagnosis not present

## 2020-02-27 DIAGNOSIS — S72009A Fracture of unspecified part of neck of unspecified femur, initial encounter for closed fracture: Secondary | ICD-10-CM | POA: Diagnosis not present

## 2020-02-27 DIAGNOSIS — M1990 Unspecified osteoarthritis, unspecified site: Secondary | ICD-10-CM | POA: Diagnosis present

## 2020-02-27 DIAGNOSIS — M6281 Muscle weakness (generalized): Secondary | ICD-10-CM | POA: Diagnosis not present

## 2020-02-27 LAB — URINALYSIS, COMPLETE (UACMP) WITH MICROSCOPIC
Bilirubin Urine: NEGATIVE
Glucose, UA: NEGATIVE mg/dL
Ketones, ur: NEGATIVE mg/dL
Nitrite: NEGATIVE
Protein, ur: 30 mg/dL — AB
Specific Gravity, Urine: 1.011 (ref 1.005–1.030)
WBC, UA: >50 WBC/hpf — ABNORMAL HIGH (ref 0–5)
pH: 8 (ref 5.0–8.0)

## 2020-02-27 LAB — BASIC METABOLIC PANEL
Anion gap: 10 (ref 5–15)
BUN: 17 mg/dL (ref 8–23)
CO2: 28 mmol/L (ref 22–32)
Calcium: 8.7 mg/dL — ABNORMAL LOW (ref 8.9–10.3)
Chloride: 99 mmol/L (ref 98–111)
Creatinine, Ser: 0.74 mg/dL (ref 0.44–1.00)
GFR, Estimated: >60 mL/min (ref >60)
Glucose, Bld: 118 mg/dL — ABNORMAL HIGH (ref 70–99)
Potassium: 4.1 mmol/L (ref 3.5–5.1)
Sodium: 137 mmol/L (ref 135–145)

## 2020-02-27 LAB — CBC WITH DIFFERENTIAL/PLATELET
Abs Immature Granulocytes: 0.02 10*3/uL (ref 0.00–0.07)
Basophils Absolute: 0 10*3/uL (ref 0.0–0.1)
Basophils Relative: 0 %
Eosinophils Absolute: 0.1 10*3/uL (ref 0.0–0.5)
Eosinophils Relative: 2 %
HCT: 35.7 % — ABNORMAL LOW (ref 36.0–46.0)
Hemoglobin: 11.9 g/dL — ABNORMAL LOW (ref 12.0–15.0)
Immature Granulocytes: 0 %
Lymphocytes Relative: 18 %
Lymphs Abs: 1.3 10*3/uL (ref 0.7–4.0)
MCH: 31.6 pg (ref 26.0–34.0)
MCHC: 33.3 g/dL (ref 30.0–36.0)
MCV: 94.7 fL (ref 80.0–100.0)
Monocytes Absolute: 0.6 10*3/uL (ref 0.1–1.0)
Monocytes Relative: 8 %
Neutro Abs: 5.2 10*3/uL (ref 1.7–7.7)
Neutrophils Relative %: 72 %
Platelets: 223 10*3/uL (ref 150–400)
RBC: 3.77 MIL/uL — ABNORMAL LOW (ref 3.87–5.11)
RDW: 14.1 % (ref 11.5–15.5)
WBC: 7.2 10*3/uL (ref 4.0–10.5)
nRBC: 0.3 % — ABNORMAL HIGH (ref 0.0–0.2)

## 2020-02-27 MED ORDER — LORAZEPAM 2 MG/ML IJ SOLN
0.5000 mg | INTRAMUSCULAR | Status: DC | PRN
  Administered 2020-02-27: 0.5 mg via INTRAVENOUS
  Filled 2020-02-27: qty 1

## 2020-02-27 MED ORDER — SODIUM CHLORIDE 0.9 % IV SOLN
1.0000 g | Freq: Once | INTRAVENOUS | Status: AC
  Administered 2020-02-27: 1 g via INTRAVENOUS
  Filled 2020-02-27: qty 10

## 2020-02-27 MED ORDER — MORPHINE SULFATE (PF) 4 MG/ML IV SOLN
4.0000 mg | Freq: Once | INTRAVENOUS | Status: AC
  Administered 2020-02-27: 4 mg via INTRAVENOUS
  Filled 2020-02-27: qty 1

## 2020-02-27 MED ORDER — OXYCODONE-ACETAMINOPHEN 5-325 MG PO TABS
1.0000 | ORAL_TABLET | Freq: Once | ORAL | Status: AC
  Administered 2020-02-27: 1 via ORAL
  Filled 2020-02-27: qty 1

## 2020-02-27 NOTE — ED Notes (Signed)
Pt was unable to get out of bed due to attempt to bear weight and ambulate in room due to pain in right hip/lower back

## 2020-02-27 NOTE — ED Notes (Signed)
Pt transported to MRI 

## 2020-02-27 NOTE — ED Triage Notes (Signed)
Pt presents to the [facility] via EMS from home with c/o right hip pain after falling. Pt states that she was doing laundry when she tripped over something that was on the floor. Pt denies hitting head and does not c/o pain at any other location. Pt A&Ox4

## 2020-02-27 NOTE — H&P (Signed)
History and Physical   NAN MAYA FBP:[id number] DOB: December 08, 1933 DOA: 02/27/2020  Referring MD/NP/PA: Dr. Charna Archer  PCP: [name], MD   Outpatient Specialists: None  Patient coming from: Home  Chief Complaint: Fall with right hip pain unable to walk  HPI: Kirsten Snyder is a 86 y.o. female with medical history significant of breast cancer, depression, GERD, gout, hyperlipidemia, hypertension, history of radiation therapy and asthma who lives alone at home.  Patient apparently sustained a mechanical fall a few days ago.  Since then she has been having significant pain in the lower back and right hip area.  Patient unable to walk or function even with her walker.  Came to the ER weak and debilitated.  Evaluation ongoing but no obvious fracture from plain films.  CT currently pending.  Patient found to have UTI which may be a precipitating event.  She has been admitted to the hospital for further evaluation.  She is currently sleepy.  Patient unable to give adequate history.  No underlying history of dementia but appears to be failing to thrive alone at home..  ED Course: Temperature is 98.2 blood pressure 143/73 pulse 89 respiratory 40 oxygen sat 91% on room air.  White count 7.2 hemoglobin 11.9 platelets 223.  Chemistry showed calcium 8.7 glucose 180 the rest appeared to be within normal.  Urinalysis shows cloudy urine was large leukocytes WBC more than 50 with rare bacteria.  CT of the hip and lumbar spine showed no acute fracture or dislocation and moderate degenerative arthritis.  MRI of the right hip currently ordered.  X-ray of both the lumbar and the right hip show 5 did not show any significant fractures.  Patient is still unable to add weight to her feet.  She will be admitted for pain management and possible placement due to failed treatment at home  Review of Systems: As per HPI otherwise 10 point review of systems negative.    Past Medical History:  Diagnosis Date  . Allergy    . Asthma   . Breast cancer ([place])   . Cancer [facility]) 2012   right breast cancer lumpectomy with rad tx  . Depression   . Edema   . GERD (gastroesophageal reflux disease)   . Gout   . Hyperlipidemia   . Hypertension   . Personal history of radiation therapy     Past Surgical History:  Procedure Laterality Date  . BREAST BIOPSY Bilateral    negative  . BREAST EXCISIONAL BIOPSY Right    right positive 04/2010  . BREAST LUMPECTOMY Right    removed a cancerous lump  . VAGINAL HYSTERECTOMY       reports that she has quit smoking. Her smoking use included cigarettes. She has a 0.50 pack-year smoking history. She has never used smokeless tobacco. She reports that she does not drink alcohol and does not use drugs.  Allergies  Allergen Reactions  . Sulfa Antibiotics     Family History  Problem Relation Age of Onset  . Leukemia Mother   . Heart disease Father   . Breast cancer Neg Hx      Prior to Admission medications   Medication Sig Start Date End Date Taking? Authorizing Provider  albuterol (VENTOLIN HFA) 108 (90 Base) MCG/ACT inhaler Inhale 2 puffs into the lungs every 6 (six) hours as needed for wheezing or shortness of breath. 02/23/20   [name], MD  ALLERGY RELIEF 10 MG tablet TAKE (1) TABLET BY MOUTH EVERY DAY  12/11/19   [name], MD  allopurinol (ZYLOPRIM) 100 MG tablet TAKE (1) TABLET BY MOUTH EVERY DAY 02/23/20   [name], MD  apixaban (ELIQUIS) 5 MG TABS tablet TAKE (1) TABLET BY MOUTH TWICE DAILY 02/23/20   [name], MD  calcium carbonate (TUMS - DOSED IN MG ELEMENTAL CALCIUM) 500 MG chewable tablet Chew 1 tablet by mouth every 8 (eight) hours as needed for indigestion.    [provider]  cephALEXin (KEFLEX) 500 MG capsule Take 1 capsule (500 mg total) by mouth 3 (three) times daily. 01/01/20   [name], MD  cephALEXin (KEFLEX) 500 MG capsule Take 1 capsule (500 mg total) by mouth 3 (three) times daily. 02/26/20   [name], MD  cetirizine (ZYRTEC) 10 MG tablet Take 10 mg by mouth daily. For allergies.    [provider]  ferrous sulfate 325 (65 FE) MG tablet Take 325 mg by mouth daily with breakfast.    [provider]  Fluticasone-Umeclidin-Vilant (TRELEGY ELLIPTA) 100-62.5-25 MCG/INH AEPB Inhale 1 puff into the lungs daily. 04/11/19   [name], MD  lovastatin (MEVACOR) 40 MG tablet TAKE (1) TABLET BY MOUTH DAILY AT BEDTIME 02/23/20   [name], MD  meclizine (ANTIVERT) 12.5 MG tablet Take 1 tablet (12.5 mg total) by mouth 3 (three) times daily as needed for dizziness. 02/09/20   [name], MD  nystatin cream (MYCOSTATIN) Apply 1 application topically 2 (two) times daily. 01/03/20   [name], MD  omeprazole (PRILOSEC) 20 MG capsule TAKE (1) CAPSULE BY MOUTH EVERY DAY 02/23/20   [name], MD  sertraline (ZOLOFT) 50 MG tablet TAKE (1) TABLET BY MOUTH EVERY DAY 02/23/20   [name], MD  tobramycin-dexamethasone [facility]) ophthalmic solution Place 2 drops into both eyes every 4 (four) hours while awake. Patient not taking: Reported on 02/23/2020 12/21/19   [name], MD  traZODone (DESYREL) 50 MG tablet TAKE (1) TABLET BY MOUTH DAILY AT BEDTIME 02/23/20   [name], MD    Physical Exam: Vitals:   02/27/20 2000 02/27/20 2130 02/27/20 2200 02/27/20 2300  BP: 139/73 (!) 143/73 137/67 120/69  Pulse: 84 89 88 89  Resp: 16 15 14 14   Temp:      TempSrc:      SpO2: 93% 95% 95% 93%  Weight:      Height:          Constitutional: Drowsy, laying in bed, no distress Vitals:   02/27/20 2000 02/27/20 2130 02/27/20 2200 02/27/20 2300  BP: 139/73 (!) 143/73 137/67 120/69  Pulse: 84 89 88 89  Resp: 16 15 14 14   Temp:      TempSrc:      SpO2: 93% 95% 95% 93%  Weight:      Height:       Eyes: PERRL, lids and conjunctivae normal ENMT: Mucous membranes are moist. Posterior pharynx clear of any exudate or lesions.Normal dentition.  Neck: normal, supple,  no masses, no thyromegaly Respiratory: clear to auscultation bilaterally, no wheezing, no crackles. Normal respiratory effort. No accessory muscle use.  Cardiovascular: Regular rate and rhythm, no murmurs / rubs / gallops. No extremity edema. 2+ pedal pulses. No carotid bruits.  Abdomen: no tenderness, no masses palpated. No hepatosplenomegaly. Bowel sounds positive.  Musculoskeletal: no clubbing / cyanosis. No joint deformity upper and lower extremities.  Tenderness and decreased range of motion of the right leg hip area, no contractures.  Normal muscle tone.  Skin: no rashes, lesions, ulcers. No induration Neurologic: CN 2-12 grossly intact. Sensation intact, DTR normal. Strength 5/5 in all 4.  Psychiatric: Drowsy, arousable, no agitation    Labs on Admission: I have personally reviewed following labs and imaging studies  CBC: Recent Labs  Lab 02/27/20 1825  WBC 7.2  NEUTROABS 5.2  HGB 11.9*  HCT 35.7*  MCV 94.7  PLT 237   Basic Metabolic Panel: Recent Labs  Lab 02/27/20 1825  NA 137  K 4.1  CL 99  CO2 28  GLUCOSE 118*  BUN 17  CREATININE 0.74  CALCIUM 8.7*   GFR: Estimated Creatinine Clearance: 61.6 mL/min (by C-G formula based on SCr of 0.74 mg/dL). Liver Function Tests: No results for input(s): AST, ALT, ALKPHOS, BILITOT, PROT, ALBUMIN in the last 168 hours. No results for input(s): LIPASE, AMYLASE in the last 168 hours. No results for input(s): AMMONIA in the last 168 hours. Coagulation Profile: No results for input(s): INR, PROTIME in the last 168 hours. Cardiac Enzymes: No results for input(s): CKTOTAL, CKMB, CKMBINDEX, TROPONINI in the last 168 hours. BNP (last 3 results) No results for input(s): PROBNP in the last 8760 hours. HbA1C: No results for input(s): HGBA1C in the last 72 hours. CBG: No results for input(s): GLUCAP in the last 168 hours. Lipid Profile: No results for input(s): CHOL, HDL, LDLCALC, TRIG, CHOLHDL, LDLDIRECT in the last 72  hours. Thyroid Function Tests: No results for input(s): TSH, T4TOTAL, FREET4, T3FREE, THYROIDAB in the last 72 hours. Anemia Panel: No results for input(s): VITAMINB12, FOLATE, FERRITIN, TIBC, IRON, RETICCTPCT in the last 72 hours. Urine analysis:    Component Value Date/Time   COLORURINE YELLOW (A) 02/27/2020 2204   APPEARANCEUR CLOUDY (A) 02/27/2020 2204   LABSPEC 1.011 02/27/2020 2204   PHURINE 8.0 02/27/2020 2204   GLUCOSEU NEGATIVE 02/27/2020 2204   HGBUR SMALL (A) 02/27/2020 2204   BILIRUBINUR NEGATIVE 02/27/2020 2204   BILIRUBINUR negative 01/03/2020 1041   KETONESUR NEGATIVE 02/27/2020 2204   PROTEINUR 30 (A) 02/27/2020 2204   UROBILINOGEN 0.2 01/03/2020 1041   NITRITE NEGATIVE 02/27/2020 2204   LEUKOCYTESUR LARGE (A) 02/27/2020 2204   Sepsis Labs: @LABRCNTIP (procalcitonin:4,lacticidven:4) )No results found for this or any previous visit (from the past 240 hour(s)).   Radiological Exams on Admission: DG Lumbar Spine 2-3 Views  Result Date: 02/27/2020 CLINICAL DATA:  Fall, right hip pain EXAM: LUMBAR SPINE - 2-3 VIEW COMPARISON:  08/01/2013 FINDINGS: Five rib-bearing segments of the lumbar spine with diminutive ribs noted at T12. Normal lumbar lordosis. No acute fracture or listhesis the lumbar spine. Vertebral body height has been preserved. There is intervertebral disc space narrowing and endplate remodeling throughout the lumbar spine, most severe at L3-4 and L4-5, in keeping with changes of mild to moderate degenerative disc disease. This appears similar to prior examination. The paraspinal soft tissues are unremarkable. IMPRESSION: No acute fracture or listhesis of the lumbar spine. Electronically Signed   By: Fidela Salisbury MD   On: 02/27/2020 19:11   CT Lumbar Spine Wo Contrast  Result Date: 02/27/2020 CLINICAL DATA:  Fall with low back pain EXAM: CT LUMBAR SPINE WITHOUT CONTRAST TECHNIQUE: Multidetector CT imaging of the lumbar spine was performed without intravenous  contrast administration. Multiplanar CT image reconstructions were also generated. COMPARISON:  None. FINDINGS: Segmentation: 5 lumbar type vertebrae. Alignment: Normal. Vertebrae: No acute fracture or focal pathologic process. Paraspinal and other soft tissues: Calcific aortic atherosclerosis. Disc levels: Multilevel facet arthrosis. No visible spinal canal  stenosis or neural impingement. IMPRESSION: 1. No acute fracture or static subluxation of the lumbar spine. Aortic Atherosclerosis (ICD10-I70.0). Electronically Signed   By: Ulyses Jarred M.D.   On: 02/27/2020 19:59   CT Hip Right Wo Contrast  Result Date: 02/27/2020 CLINICAL DATA:  Hip trauma, fracture suspected, right hip pain following fall EXAM: CT OF THE RIGHT HIP WITHOUT CONTRAST TECHNIQUE: Multidetector CT imaging of the right hip was performed according to the standard protocol. Multiplanar CT image reconstructions were also generated. COMPARISON:  None. FINDINGS: Bones/Joint/Cartilage Normal alignment. No acute fracture or dislocation. Moderate right hip degenerative arthritis with asymmetric joint space narrowing and subchondral sclerosis within the femoral head. No lytic or blastic bone lesion. Ligaments Suboptimally assessed by CT. Muscles and Tendons Dystrophic calcification within the hamstring origins likely represents the sequela of remote inflammation or trauma. Visualized musculature is otherwise unremarkable. Soft tissues Vascular calcifications noted within the visualized iliac vasculature. Soft tissues are otherwise unremarkable. IMPRESSION: No acute fracture or dislocation. Moderate degenerative arthritis. Electronically Signed   By: Fidela Salisbury MD   On: 02/27/2020 20:16   DG Hip Unilat W or Wo Pelvis 2-3 Views Right  Result Date: 02/27/2020 CLINICAL DATA:  Fall, right hip pain EXAM: DG HIP (WITH OR WITHOUT PELVIS) 2-3V RIGHT COMPARISON:  None. FINDINGS: Single view radiograph the pelvis and two view radiograph of the right hip  demonstrates normal alignment. No fracture or dislocation. Mild right hip degenerative arthritis is present with joint space narrowing noted. Soft tissues are unremarkable. IMPRESSION: No acute fracture or dislocation. Electronically Signed   By: Fidela Salisbury MD   On: 02/27/2020 19:09      Assessment/Plan Principal Problem:   UTI (urinary tract infection) Active Problems:   Acid reflux   Essential hypertension   Fall at home, initial encounter     #1 UTI: Urine and blood cultures obtained.  Could be the precipitating cause of her initial weakness.  Treat empirically with Rocephin.  Follow culture results and adjust antibiotics as needed.  #2 fall with right hip pain: Follow MRI results.  Mainly pain management and PT OT at this point.  #3 GERD: Continue with PPIs  #4 essential hypertension: Continue blood pressure medications.  #5 morbid obesity: Dietary counseling  #6 altered mental status: Mild drowsiness.  Probably due to medications.   DVT prophylaxis: Lovenox Code Status: Full code Family Communication: No family at bedside Disposition Plan: Probably skilled facility Consults called: None Admission status: Inpatient  Severity of Illness: The appropriate patient status for this patient is INPATIENT. Inpatient status is judged to be reasonable and necessary in order to provide the required intensity of service to ensure the patient's safety. The patient's presenting symptoms, physical exam findings, and initial radiographic and laboratory data in the context of their chronic comorbidities is felt to place them at high risk for further clinical deterioration. Furthermore, it is not anticipated that the patient will be medically stable for discharge from the hospital within 2 midnights of admission. The following factors support the patient status of inpatient.   " The patient's presenting symptoms include fall with right hip pain. " The worrisome physical exam findings  include drowsiness with tenderness in the right hip area. " The initial radiographic and laboratory data are worrisome because of so far negative except for UTI. " The chronic co-morbidities include GERD breast cancer.   * I certify that at the point of admission it is my clinical judgment that the patient will require inpatient hospital  care spanning beyond 2 midnights from the point of admission due to high intensity of service, high risk for further deterioration and high frequency of surveillance required.Barbette Merino MD [facility] Pager [phone]  If 7PM-7AM, please contact night-coverage [email or web address] Password [email or web address]  02/27/2020, 11:38 PM

## 2020-02-27 NOTE — ED Provider Notes (Signed)
[facility] Emergency Department Provider Note   ____________________________________________   First MD Initiated Contact with Patient 02/27/20 1803     (approximate)  I have reviewed the triage vital signs and the nursing notes.   HISTORY  Chief Complaint Fall   HPI Kirsten Snyder is a 86 y.o. female with past medical history of hypertension, hyperlipidemia, asthma, breast cancer, and PE on Eliquis who presents to the ED complaining of fall.  Patient reports that just prior to arrival she was attempting to do laundry when she slipped on close that were on the ground.  She fell backwards and onto her right hip, but denies hitting her head or losing consciousness.  She now complains of significant pain in her right hip and lower back.  She has been unable to walk since the fall.  She denies pain in her left lower extremity, abdomen, chest, or upper extremities.        Past Medical History:  Diagnosis Date  . Allergy   . Asthma   . Breast cancer ([place])   . Cancer [facility]) 2012   right breast cancer lumpectomy with rad tx  . Depression   . Edema   . GERD (gastroesophageal reflux disease)   . Gout   . Hyperlipidemia   . Hypertension   . Personal history of radiation therapy     Patient Active Problem List   Diagnosis Date Noted  . UTI (urinary tract infection) 02/27/2020  . Fall at home, initial encounter 02/27/2020  . Sepsis secondary to UTI ([place]) 10/01/2019  . Sepsis ([place]) 10/01/2019  . Hypokalemia 10/01/2019  . Atherosclerosis of abdominal aorta ([place]) 10/12/2018  . SOBOE (shortness of breath on exertion) 10/12/2018  . Osteopenia of neck of right femur 06/29/2018  . Breast cancer, right ([place]) 06/17/2016  . Essential hypertension 12/13/2015  . Breast abscess of female 11/07/2015  . Familial multiple lipoprotein-type hyperlipidemia 10/25/2014  . Clinical depression 10/25/2014  . Acid reflux 10/25/2014  . Acute gastrointestinal bleeding  10/25/2014  . Aggrieved 10/25/2014  . Airway hyperreactivity 10/25/2014  . Arthritis of knee 10/25/2014  . H/O: gout 10/25/2014  . Adiposity 10/25/2014  . Arthritis, degenerative 10/25/2014  . H/O adenomatous polyp of colon 03/07/2014    Past Surgical History:  Procedure Laterality Date  . BREAST BIOPSY Bilateral    negative  . BREAST EXCISIONAL BIOPSY Right    right positive 04/2010  . BREAST LUMPECTOMY Right    removed a cancerous lump  . VAGINAL HYSTERECTOMY      Prior to Admission medications   Medication Sig Start Date End Date Taking? Authorizing Provider  albuterol (VENTOLIN HFA) 108 (90 Base) MCG/ACT inhaler Inhale 2 puffs into the lungs every 6 (six) hours as needed for wheezing or shortness of breath. 02/23/20   [name], MD  ALLERGY RELIEF 10 MG tablet TAKE (1) TABLET BY MOUTH EVERY DAY 12/11/19   [name], MD  allopurinol (ZYLOPRIM) 100 MG tablet TAKE (1) TABLET BY MOUTH EVERY DAY 02/23/20   [name], MD  apixaban (ELIQUIS) 5 MG TABS tablet TAKE (1) TABLET BY MOUTH TWICE DAILY 02/23/20   [name], MD  calcium carbonate (TUMS - DOSED IN MG ELEMENTAL CALCIUM) 500 MG chewable tablet Chew 1 tablet by mouth every 8 (eight) hours as needed for indigestion.    [provider]  cephALEXin (KEFLEX) 500 MG capsule Take 1 capsule (500 mg total) by mouth 3 (three) times daily. 01/01/20  [name], MD  cephALEXin (KEFLEX) 500 MG capsule Take 1 capsule (500 mg total) by mouth 3 (three) times daily. 02/26/20   [name], MD  cetirizine (ZYRTEC) 10 MG tablet Take 10 mg by mouth daily. For allergies.    [provider]  ferrous sulfate 325 (65 FE) MG tablet Take 325 mg by mouth daily with breakfast.    [provider]  Fluticasone-Umeclidin-Vilant (TRELEGY ELLIPTA) 100-62.5-25 MCG/INH AEPB Inhale 1 puff into the lungs daily. 04/11/19   [name], MD  lovastatin (MEVACOR) 40 MG tablet TAKE (1) TABLET BY MOUTH DAILY AT  BEDTIME 02/23/20   [name], MD  meclizine (ANTIVERT) 12.5 MG tablet Take 1 tablet (12.5 mg total) by mouth 3 (three) times daily as needed for dizziness. 02/09/20   [name], MD  nystatin cream (MYCOSTATIN) Apply 1 application topically 2 (two) times daily. 01/03/20   [name], MD  omeprazole (PRILOSEC) 20 MG capsule TAKE (1) CAPSULE BY MOUTH EVERY DAY 02/23/20   [name], MD  sertraline (ZOLOFT) 50 MG tablet TAKE (1) TABLET BY MOUTH EVERY DAY 02/23/20   [name], MD  tobramycin-dexamethasone [facility]) ophthalmic solution Place 2 drops into both eyes every 4 (four) hours while awake. Patient not taking: Reported on 02/23/2020 12/21/19   [name], MD  traZODone (DESYREL) 50 MG tablet TAKE (1) TABLET BY MOUTH DAILY AT BEDTIME 02/23/20   [name], MD    Allergies Sulfa antibiotics  Family History  Problem Relation Age of Onset  . Leukemia Mother   . Heart disease Father   . Breast cancer Neg Hx     Social History Social History   Tobacco Use  . Smoking status: Former Smoker    Packs/day: 0.25    Years: 2.00    Pack years: 0.50    Types: Cigarettes  . Smokeless tobacco: Never Used  . Tobacco comment: smoking cessation materials not required  Vaping Use  . Vaping Use: Never used  Substance Use Topics  . Alcohol use: No    Alcohol/week: 0.0 standard drinks  . Drug use: No    Review of Systems  Constitutional: No fever/chills Eyes: No visual changes. ENT: No sore throat. Cardiovascular: Denies chest pain. Respiratory: Denies shortness of breath. Gastrointestinal: No abdominal pain.  No nausea, no vomiting.  No diarrhea.  No constipation. Genitourinary: Negative for dysuria. Musculoskeletal: Positive for back pain.  Positive for right hip pain. Skin: Negative for rash. Neurological: Negative for headaches, focal weakness or numbness.  ____________________________________________   PHYSICAL EXAM:  VITAL SIGNS: ED Triage  Vitals  Enc Vitals Group     BP      Pulse      Resp      Temp      Temp src      SpO2      Weight      Height      Head Circumference      Peak Flow      Pain Score      Pain Loc      Pain Edu?      Excl. in [place]?     Constitutional: Alert and oriented. Eyes: Conjunctivae are normal. Head: Atraumatic. Nose: No congestion/rhinnorhea. Mouth/Throat: Mucous membranes are moist. Neck: Normal ROM, no midline cervical spine tenderness. Cardiovascular: Normal rate, regular rhythm. Grossly normal heart sounds.  2+ DP pulses bilaterally. Respiratory: Normal respiratory effort.  No retractions. Lungs CTAB. Gastrointestinal:  Soft and nontender. No distention. Genitourinary: deferred Musculoskeletal: Diffuse tenderness at right hip and lumbar spine, otherwise no tenderness to left hip, bilateral knees, or bilateral ankles. Neurologic:  Normal speech and language. No gross focal neurologic deficits are appreciated. Skin:  Skin is warm, dry and intact. No rash noted. Psychiatric: Mood and affect are normal. Speech and behavior are normal.  ____________________________________________   LABS (all labs ordered are listed, but only abnormal results are displayed)  Labs Reviewed  BASIC METABOLIC PANEL - Abnormal; Notable for the following components:      Result Value   Glucose, Bld 118 (*)    Calcium 8.7 (*)    All other components within normal limits  CBC WITH DIFFERENTIAL/PLATELET - Abnormal; Notable for the following components:   RBC 3.77 (*)    Hemoglobin 11.9 (*)    HCT 35.7 (*)    nRBC 0.3 (*)    All other components within normal limits  URINALYSIS, COMPLETE (UACMP) WITH MICROSCOPIC - Abnormal; Notable for the following components:   Color, Urine YELLOW (*)    APPearance CLOUDY (*)    Hgb urine dipstick SMALL (*)    Protein, ur 30 (*)    Leukocytes,Ua LARGE (*)    WBC, UA >50 (*)    Bacteria, UA RARE (*)    All other components within normal limits  URINE CULTURE    ____________________________________________   PROCEDURES  Procedure(s) performed (including Critical Care):  Procedures   ____________________________________________   INITIAL IMPRESSION / ASSESSMENT AND PLAN / ED COURSE       86 year old female with past medical history of hypertension, hyperlipidemia, asthma, breast cancer, and PE on Eliquis who presents to the ED complaining of right hip and low back pain following a fall.  She denies hitting her head or losing consciousness, no evidence of head or neck trauma and we will hold off on CT imaging.  She does have significant pain to her right hip but is neurovascularly intact to her right lower extremity.  We will further assess with x-rays of right hip and lumbar spine, treat her pain with IV morphine.  X-rays as well as CT scans of right hip and lumbar spine are negative for acute process.  On reassessment, patient is unable to stand up and ambulate with the assistance of a walker.  Given concern for occult fracture, we will check MRI of right hip.  It also appears she was recently prescribed antibiotics for a UTI, which could be contributing to her generalized weakness.  Patient endorses ongoing dysuria, we will check UA.  UA is concerning for infection, we will send for culture and treat with Rocephin.  No obvious fracture noted on MRI of patient's hip and at this point I suspect her generalized weakness and difficulty walking is secondary to UTI.  Given her inability to walk and difficulty caring for herself at home, case was discussed with hospitalist for admission.      ____________________________________________   FINAL CLINICAL IMPRESSION(S) / ED DIAGNOSES  Final diagnoses:  Fall, initial encounter  Acute cystitis without hematuria  Generalized weakness     ED Discharge Orders    None       Note:  This document was prepared using Dragon voice recognition software and may include unintentional dictation  errors.   Blake Divine, MD 02/28/20 0000

## 2020-02-28 ENCOUNTER — Encounter: Payer: Self-pay | Admitting: Internal Medicine

## 2020-02-28 DIAGNOSIS — M25551 Pain in right hip: Secondary | ICD-10-CM

## 2020-02-28 LAB — CBC
HCT: 33.6 % — ABNORMAL LOW (ref 36.0–46.0)
Hemoglobin: 11.3 g/dL — ABNORMAL LOW (ref 12.0–15.0)
MCH: 31.6 pg (ref 26.0–34.0)
MCHC: 33.6 g/dL (ref 30.0–36.0)
MCV: 93.9 fL (ref 80.0–100.0)
Platelets: 209 10*3/uL (ref 150–400)
RBC: 3.58 MIL/uL — ABNORMAL LOW (ref 3.87–5.11)
RDW: 13.9 % (ref 11.5–15.5)
WBC: 6.5 10*3/uL (ref 4.0–10.5)
nRBC: 0 % (ref 0.0–0.2)

## 2020-02-28 LAB — COMPREHENSIVE METABOLIC PANEL
ALT: 10 U/L (ref 0–44)
AST: 16 U/L (ref 15–41)
Albumin: 3.4 g/dL — ABNORMAL LOW (ref 3.5–5.0)
Alkaline Phosphatase: 57 U/L (ref 38–126)
Anion gap: 9 (ref 5–15)
BUN: 15 mg/dL (ref 8–23)
CO2: 28 mmol/L (ref 22–32)
Calcium: 8.5 mg/dL — ABNORMAL LOW (ref 8.9–10.3)
Chloride: 100 mmol/L (ref 98–111)
Creatinine, Ser: 0.73 mg/dL (ref 0.44–1.00)
GFR, Estimated: >60 mL/min (ref >60)
Glucose, Bld: 116 mg/dL — ABNORMAL HIGH (ref 70–99)
Potassium: 3.9 mmol/L (ref 3.5–5.1)
Sodium: 137 mmol/L (ref 135–145)
Total Bilirubin: 1.4 mg/dL — ABNORMAL HIGH (ref 0.3–1.2)
Total Protein: 5.9 g/dL — ABNORMAL LOW (ref 6.5–8.1)

## 2020-02-28 LAB — RESP PANEL BY RT PCR (RSV, FLU A&B, COVID)
Influenza A by PCR: NEGATIVE
Influenza B by PCR: NEGATIVE
Respiratory Syncytial Virus by PCR: NEGATIVE
SARS Coronavirus 2 by RT PCR: NEGATIVE

## 2020-02-28 MED ORDER — SERTRALINE HCL 50 MG PO TABS
50.0000 mg | ORAL_TABLET | Freq: Every day | ORAL | Status: DC
  Administered 2020-02-28 – 2020-03-01 (×3): 50 mg via ORAL
  Filled 2020-02-28 (×3): qty 1

## 2020-02-28 MED ORDER — SODIUM CHLORIDE 0.9 % IV SOLN
1.0000 g | INTRAVENOUS | Status: AC
  Administered 2020-02-28 – 2020-02-29 (×2): 1 g via INTRAVENOUS
  Filled 2020-02-28: qty 10
  Filled 2020-02-28: qty 1

## 2020-02-28 MED ORDER — PANTOPRAZOLE SODIUM 40 MG PO TBEC
40.0000 mg | DELAYED_RELEASE_TABLET | Freq: Every day | ORAL | Status: DC
  Administered 2020-02-28 – 2020-03-01 (×3): 40 mg via ORAL
  Filled 2020-02-28 (×3): qty 1

## 2020-02-28 MED ORDER — ACETAMINOPHEN 325 MG PO TABS
650.0000 mg | ORAL_TABLET | Freq: Four times a day (QID) | ORAL | Status: DC | PRN
  Administered 2020-03-01: 17:00:00 650 mg via ORAL
  Filled 2020-02-28 (×3): qty 2

## 2020-02-28 MED ORDER — FLUTICASONE-UMECLIDIN-VILANT 100-62.5-25 MCG/INH IN AEPB: 1.0000 | INHALATION_SPRAY | Freq: Every day | RESPIRATORY_TRACT | Status: DC

## 2020-02-28 MED ORDER — APIXABAN 5 MG PO TABS
5.0000 mg | ORAL_TABLET | Freq: Two times a day (BID) | ORAL | Status: DC
  Administered 2020-02-28 – 2020-03-01 (×5): 5 mg via ORAL
  Filled 2020-02-28 (×5): qty 1

## 2020-02-28 MED ORDER — ACETAMINOPHEN 650 MG RE SUPP: 650.0000 mg | Freq: Four times a day (QID) | RECTAL | Status: DC | PRN

## 2020-02-28 MED ORDER — KETOROLAC TROMETHAMINE 30 MG/ML IJ SOLN
15.0000 mg | Freq: Four times a day (QID) | INTRAMUSCULAR | Status: DC | PRN
  Administered 2020-02-28 – 2020-02-29 (×3): 15 mg via INTRAVENOUS
  Filled 2020-02-28 (×3): qty 1

## 2020-02-28 MED ORDER — ONDANSETRON HCL 4 MG/2ML IJ SOLN: 4.0000 mg | Freq: Four times a day (QID) | INTRAMUSCULAR | Status: DC | PRN

## 2020-02-28 MED ORDER — FERROUS SULFATE 325 (65 FE) MG PO TABS
325.0000 mg | ORAL_TABLET | Freq: Every day | ORAL | Status: DC
  Administered 2020-02-29 – 2020-03-01 (×2): 325 mg via ORAL
  Filled 2020-02-28 (×3): qty 1

## 2020-02-28 MED ORDER — FLUTICASONE FUROATE-VILANTEROL 100-25 MCG/INH IN AEPB
1.0000 | INHALATION_SPRAY | Freq: Every day | RESPIRATORY_TRACT | Status: DC
  Administered 2020-02-29 – 2020-03-01 (×2): 1 via RESPIRATORY_TRACT
  Filled 2020-02-28: qty 28

## 2020-02-28 MED ORDER — POTASSIUM CHLORIDE CRYS ER 20 MEQ PO TBCR: 40.0000 meq | EXTENDED_RELEASE_TABLET | Freq: Once | ORAL | Status: DC

## 2020-02-28 MED ORDER — POTASSIUM CHLORIDE 10 MEQ/100ML IV SOLN
10.0000 meq | INTRAVENOUS | Status: DC
Start: 2020-02-28 — End: 2020-02-28

## 2020-02-28 MED ORDER — UMECLIDINIUM BROMIDE 62.5 MCG/INH IN AEPB
1.0000 | INHALATION_SPRAY | Freq: Every day | RESPIRATORY_TRACT | Status: DC
  Administered 2020-02-29 – 2020-03-01 (×2): 1 via RESPIRATORY_TRACT
  Filled 2020-02-28: qty 7

## 2020-02-28 MED ORDER — SODIUM CHLORIDE 0.45 % IV SOLN: INTRAVENOUS | Status: DC

## 2020-02-28 MED ORDER — ALBUTEROL SULFATE HFA 108 (90 BASE) MCG/ACT IN AERS
2.0000 | INHALATION_SPRAY | Freq: Four times a day (QID) | RESPIRATORY_TRACT | Status: DC | PRN
  Filled 2020-02-28: qty 6.7

## 2020-02-28 MED ORDER — ONDANSETRON HCL 4 MG PO TABS: 4.0000 mg | ORAL_TABLET | Freq: Four times a day (QID) | ORAL | Status: DC | PRN

## 2020-02-28 MED ORDER — ALLOPURINOL 100 MG PO TABS
100.0000 mg | ORAL_TABLET | Freq: Every day | ORAL | Status: DC
  Administered 2020-02-28 – 2020-03-01 (×3): 100 mg via ORAL
  Filled 2020-02-28 (×4): qty 1

## 2020-02-28 NOTE — ED Notes (Signed)
Daughter at bedside. Pt given diet coke per request. Meal tray removed from bedside table.

## 2020-02-28 NOTE — ED Notes (Signed)
Pt incontinent of urine. Pt changed. Bed linen changed. Pure wick in place. Pt transferred to rm 34 at this time. Call light within reach of pt. Pt has no further needs at this time.

## 2020-02-28 NOTE — Evaluation (Signed)
Physical Therapy Evaluation Patient Details Name: Kirsten Snyder MRN: [id number] DOB: [birth year] Today's Date: 02/28/2020   History of Present Illness  86 y.o. female with medical history significant of breast cancer, depression, GERD, gout, hyperlipidemia, hypertension, history of radiation therapy and asthma who lives alone at home.  Patient apparently sustained a mechanical fall a few days ago.  Since then she has been having significant pain in the lower back and right hip area.  Patient unable to walk or function even with her walker.  Came to the ER weak and debilitated.  Evaluation ongoing but no obvious fracture from plain films.  MR of R hip showed torn right hamstring tendons from their attachment site on the ischium with no acute hip or pelvic fracture or AVN.Marland Kitchen  Patient found to have UTI which may be a precipitating event.  She has been admitted to the hospital for further evaluation.  Patient unable to give adequate history.  No underlying history of dementia but appears to be failing to thrive alone at home..  Clinical Impression  Pt lying on stretcher bed upon arrival to room with daughter at bedside. Pt reports ambulating using rollator PTA and utilizes lift chair at home often for transfers. Pt currently requires max+2 A for supine <> sit for truncal management and BLE control off and back onto bed. Pt reluctant to attempt standing secondary to pain in RLE/hip/low back. With increased encouragement, pt began to self-initiate due to discomfort sitting edge of bed and ultimately required min+2 to stand. Pt with poor standing tolerance and sat quickly. Pt stood one more time with min+2 A and demonstrated heavy reliance on RW. Pt deferred ambulation attempts secondary to increased pain and stated that she needed to lie back down. Pt presents with deficits in pain, strength, functional activity tolerance, balance, and functional mobility. Pt would benefit from skilled PT to address current  deficits. Due to pt only having PRN assistance from family and decreased ability to mobilize independently and safety, recommend STR at discharge to optimize return to [facility] and maximize functional mobility.     Follow Up Recommendations SNF    Equipment Recommendations  Rolling walker with 5" wheels    Recommendations for Other Services       Precautions / Restrictions Precautions Precautions: Fall Restrictions Weight Bearing Restrictions: No      Mobility  Bed Mobility Overal bed mobility: Needs Assistance Bed Mobility: Supine to Sit;Sit to Supine     Supine to sit: Max assist;+2 for physical assistance;+2 for safety/equipment Sit to supine: Max assist;+2 for physical assistance;+2 for safety/equipment   General bed mobility comments: Heavy assistance required as pt guarding and resistive of movements; max+2 A for trunk and BLE management    Transfers Overall transfer level: Needs assistance Equipment used: Rolling walker (2 wheeled) Transfers: Sit to/from Stand Sit to Stand: Min assist;+2 physical assistance;+2 safety/equipment         General transfer comment: Min A+2 with cues for hand placement and technique; pt self initiated in order to reposition herself from being painful from sitting  Ambulation/Gait             General Gait Details: pt not willing to attempt secondary to increased pain  Stairs            Wheelchair Mobility    Modified Rankin (Stroke Patients Only)       Balance Overall balance assessment: Needs assistance Sitting-balance support: Feet supported Sitting balance-Leahy Scale: Good Sitting balance - Comments: no  overt LOB   Standing balance support: Bilateral upper extremity supported Standing balance-Leahy Scale: Fair Standing balance comment: BUE reliant on RW; min-min+2 A for balance                             Pertinent Vitals/Pain Pain Assessment: Faces Faces Pain Scale: Hurts whole lot Pain  Location: back and R hip Pain Descriptors / Indicators: Aching;Discomfort;Grimacing;Guarding Pain Intervention(s): Monitored during session;Repositioned;Limited activity within patient's tolerance    Home Living Family/patient expects to be discharged to:: Private residence Living Arrangements: Other relatives Available Help at Discharge: Other (Comment);Family;Available PRN/intermittently (lives with 41 y/o adult grandchild who works during the day) Type of Home: House Home Access: Stairs to enter Entrance Stairs-Rails: Psychiatric nurse of Steps: Linda: One level Home Equipment: Environmental consultant - 4 wheels;Walker - standard;Other (comment) (lift chair)      Prior Function Level of Independence: Independent with assistive device(s)         Comments: Pt reports using rollator for ambulation PTA and duahger, who was present in room, states that pt uses lift chair. Pt denies fall history in the last 6 months except for admitting mechanical fall.     Hand Dominance   Dominant Hand: Right    Extremity/Trunk Assessment   Upper Extremity Assessment Upper Extremity Assessment: Defer to OT evaluation;Generalized weakness;Overall [facility] for tasks assessed    Lower Extremity Assessment Lower Extremity Assessment: Generalized weakness;Overall WFL for tasks assessed;RLE deficits/detail RLE Deficits / Details: has torn hamstring and reports pain in any position RLE: Unable to fully assess due to pain       Communication   Communication: No difficulties  Cognition Arousal/Alertness: Awake/alert Behavior During Therapy: WFL for tasks assessed/performed Overall Cognitive Status: Within Functional Limits for tasks assessed                                 General Comments: Pt alert and oriented x 4      General Comments      Exercises     Assessment/Plan    PT Assessment Patient needs continued PT services  PT Problem List Decreased  strength;Decreased activity tolerance;Decreased balance;Decreased mobility;Obesity;Pain       PT Treatment Interventions DME instruction;Gait training;Stair training;Functional mobility training;Therapeutic activities;Therapeutic exercise;Balance training;Patient/family education    PT Goals (Current goals can be found in the Care Plan section)  Acute Rehab PT Goals Patient Stated Goal: decrease pain PT Goal Formulation: With patient Time For Goal Achievement: 03/13/20 Potential to Achieve Goals: Good    Frequency Min 2X/week   Barriers to discharge        Co-evaluation   Reason for Co-Treatment: For patient/therapist safety;To address functional/ADL transfers PT goals addressed during session: Mobility/safety with mobility;Balance;Proper use of DME OT goals addressed during session: ADL's and self-care       AM-PAC PT "6 Clicks" Mobility  Outcome Measure Help needed turning from your back to your side while in a flat bed without using bedrails?: A Lot Help needed moving from lying on your back to sitting on the side of a flat bed without using bedrails?: A Lot Help needed moving to and from a bed to a chair (including a wheelchair)?: A Lot Help needed standing up from a chair using your arms (e.g., wheelchair or bedside chair)?: A Lot Help needed to walk in hospital room?: A Lot Help needed climbing 3-5  steps with a railing? : Total 6 Click Score: 11    End of Session Equipment Utilized During Treatment: Gait belt Activity Tolerance: Patient limited by pain Patient left: in bed;with call bell/phone within reach;with family/visitor present Nurse Communication: Mobility status PT Visit Diagnosis: Unsteadiness on feet (R26.81);Other abnormalities of gait and mobility (R26.89);Muscle weakness (generalized) (M62.81);History of falling (Z91.81);Pain Pain - Right/Left: Right Pain - part of body: Leg;Hip (back)    Time: 1420-1450 PT Time Calculation (min) (ACUTE ONLY): 30  min   Charges:              Vale Haven, SPT  Vale Haven 02/28/2020, 4:28 PM

## 2020-02-28 NOTE — ED Notes (Signed)
Transport request placed by Network engineer

## 2020-02-28 NOTE — ED Notes (Addendum)
Daughter updated on pt admission and condition via phone.

## 2020-02-28 NOTE — Progress Notes (Signed)
PROGRESS NOTE  Kirsten Snyder MBE:[id number] DOB: 09-19-1933 DOA: 02/27/2020 PCP: [name], MD  HPI/Recap of past 24 hours: HPI from [name] is a 86 y.o. female with medical history significant of breast cancer, depression, GERD, gout, hyperlipidemia, hypertension, history of radiation therapy and asthma who lives alone at home. Patient apparently sustained a mechanical fall a day PTA, has since been having significant pain in the lower back and right hip area. Patient unable to walk or function even with her walker.  Came to the ER weak and debilitated.  Evaluation ongoing but no obvious fracture from imaging. Patient found to have UTI (c/o burning urination) which may be a precipitating event.  She has been admitted to the hospital for further evaluation. In the ED, temperature is 98.2 blood pressure 143/73 pulse 89 respiratory 14, oxygen sat 91% on room air.  White count 7.2 hemoglobin 11.9 platelets 223.  Chemistry showed calcium 8.7 glucose 180 the rest appeared to be within normal.  Urinalysis shows cloudy urine was large leukocytes WBC more than 50 with rare bacteria.  CT of the hip and lumbar spine showed no acute fracture or dislocation and moderate degenerative arthritis. MRI of the right hip showed no fracture, but showed torn right hamstring tendons from the attachment site on the ischium. Patient is still unable to add weight to her feet.  Patient admitted for further management.    Today, patient denies any new complaints, reports right hip pain is tolerable after IV narcotics.  Reports dysuria.  Patient denies any chest pain, abdominal pain, nausea/vomiting, fever/chills.  Daughter at bedside, discussed plan with daughter extensively.   Assessment/Plan: Principal Problem:   UTI (urinary tract infection) Active Problems:   Acid reflux   Essential hypertension   Fall at home, initial encounter   Mechanical fall with right hip/thigh pain MRI right hip  showed no fracture, but showed torn right hamstring tendons from the attachment site of the ischium Discussed with Orthopedics Dr Roland Rack, non-surgical management, WBAT, ice pack, ambulate with walker Pain management PT/OT  Possible UTI Patient reports dysuria UA showed large leukocytes, greater than 50 WBC, rare bacteria UC pending Continue Rocephin  Hypertension Not on any medications at home BP on the soft side Continue IV fluids  History of PE and DVT in 7/21 Continue Eliquis  S/p meningioma resection 08/2019 Followed by [facility]  GERD Continue PPI   Asthma Continue inhaler  Obesity Lifestyle modification advised          Malnutrition Type:      Malnutrition Characteristics:      Nutrition Interventions:       Estimated body mass index is 39.48 kg/m as calculated from the following:   Height as of this encounter: 5\' 4"  (1.626 m).   Weight as of this encounter: 104.3 kg.     Code Status: Full  Family Communication: Discussed with daughter at bedside  Disposition Plan: Status is: Inpatient  Remains inpatient appropriate because:Inpatient level of care appropriate due to severity of illness   Dispo: The patient is from: Home              Anticipated d/c is to: SNF              Anticipated d/c date is: 2 days              Patient currently is not medically stable to d/c.    Consultants:  Discussed with orthopedics  Procedures: None  Antimicrobials:  Rocephin  DVT prophylaxis: Eliquis   Objective: Vitals:   02/28/20 1000 02/28/20 1015 02/28/20 1330 02/28/20 1400  BP: 100/74  (!) 84/74 107/67  Pulse: 76 78 71 76  Resp:    18  Temp:      TempSrc:      SpO2: 94% 95% 95% 99%  Weight:      Height:        Intake/Output Summary (Last 24 hours) at 02/28/2020 1443 Last data filed at 02/28/2020 0800 Gross per 24 hour  Intake 506.96 ml  Output --  Net 506.96 ml   Filed Weights   02/27/20 1809  Weight: 104.3 kg     Exam:  General: NAD   Cardiovascular: S1, S2 present  Respiratory: CTAB  Abdomen: Soft, nontender, nondistended, bowel sounds present  Musculoskeletal: No bilateral pedal edema noted, decreased range of motion of the right leg/hip area, tenderness around thigh, strength 5/5 in all extremities  Skin: Normal  Psychiatry: Normal mood    Data Reviewed: CBC: Recent Labs  Lab 02/27/20 1825 02/28/20 0347  WBC 7.2 6.5  NEUTROABS 5.2  --   HGB 11.9* 11.3*  HCT 35.7* 33.6*  MCV 94.7 93.9  PLT 223 093   Basic Metabolic Panel: Recent Labs  Lab 02/27/20 1825 02/28/20 0347  NA 137 137  K 4.1 3.9  CL 99 100  CO2 28 28  GLUCOSE 118* 116*  BUN 17 15  CREATININE 0.74 0.73  CALCIUM 8.7* 8.5*   GFR: Estimated Creatinine Clearance: 61.6 mL/min (by C-G formula based on SCr of 0.73 mg/dL). Liver Function Tests: Recent Labs  Lab 02/28/20 0347  AST 16  ALT 10  ALKPHOS 57  BILITOT 1.4*  PROT 5.9*  ALBUMIN 3.4*   No results for input(s): LIPASE, AMYLASE in the last 168 hours. No results for input(s): AMMONIA in the last 168 hours. Coagulation Profile: No results for input(s): INR, PROTIME in the last 168 hours. Cardiac Enzymes: No results for input(s): CKTOTAL, CKMB, CKMBINDEX, TROPONINI in the last 168 hours. BNP (last 3 results) No results for input(s): PROBNP in the last 8760 hours. HbA1C: No results for input(s): HGBA1C in the last 72 hours. CBG: No results for input(s): GLUCAP in the last 168 hours. Lipid Profile: No results for input(s): CHOL, HDL, LDLCALC, TRIG, CHOLHDL, LDLDIRECT in the last 72 hours. Thyroid Function Tests: No results for input(s): TSH, T4TOTAL, FREET4, T3FREE, THYROIDAB in the last 72 hours. Anemia Panel: No results for input(s): VITAMINB12, FOLATE, FERRITIN, TIBC, IRON, RETICCTPCT in the last 72 hours. Urine analysis:    Component Value Date/Time   COLORURINE YELLOW (A) 02/27/2020 2204   APPEARANCEUR CLOUDY (A) 02/27/2020 2204    LABSPEC 1.011 02/27/2020 2204   PHURINE 8.0 02/27/2020 2204   GLUCOSEU NEGATIVE 02/27/2020 2204   HGBUR SMALL (A) 02/27/2020 2204   BILIRUBINUR NEGATIVE 02/27/2020 2204   BILIRUBINUR negative 01/03/2020 1041   KETONESUR NEGATIVE 02/27/2020 2204   PROTEINUR 30 (A) 02/27/2020 2204   UROBILINOGEN 0.2 01/03/2020 1041   NITRITE NEGATIVE 02/27/2020 2204   LEUKOCYTESUR LARGE (A) 02/27/2020 2204   Sepsis Labs: @LABRCNTIP (procalcitonin:4,lacticidven:4)  ) Recent Results (from the past 240 hour(s))  Resp Panel by RT PCR (RSV, Flu A&B, Covid) - Nasopharyngeal Swab     Status: None   Collection Time: 02/28/20  3:47 AM   Specimen: Nasopharyngeal Swab  Result Value Ref Range Status   SARS Coronavirus 2 by RT PCR NEGATIVE NEGATIVE Final    Comment: (NOTE) SARS-CoV-2 target nucleic  acids are NOT DETECTED.  The SARS-CoV-2 RNA is generally detectable in upper respiratoy specimens during the acute phase of infection. The lowest concentration of SARS-CoV-2 viral copies this assay can detect is 131 copies/mL. A negative result does not preclude SARS-Cov-2 infection and should not be used as the sole basis for treatment or other patient management decisions. A negative result may occur with  improper specimen collection/handling, submission of specimen other than nasopharyngeal swab, presence of viral mutation(s) within the areas targeted by this assay, and inadequate number of viral copies (<131 copies/mL). A negative result must be combined with clinical observations, patient history, and epidemiological information. The expected result is Negative.  Fact Sheet for Patients:  PinkCheek.be  Fact Sheet for Healthcare Providers:  GravelBags.it  This test is no t yet approved or cleared by the [place] FDA and  has been authorized for detection and/or diagnosis of SARS-CoV-2 by FDA under an Emergency Use Authorization (EUA). This EUA  will remain  in effect (meaning this test can be used) for the duration of the COVID-19 declaration under Section 564(b)(1) of the Act, 21 U.S.C. section 360bbb-3(b)(1), unless the authorization is terminated or revoked sooner.     Influenza A by PCR NEGATIVE NEGATIVE Final   Influenza B by PCR NEGATIVE NEGATIVE Final    Comment: (NOTE) The Xpert Xpress SARS-CoV-2/FLU/RSV assay is intended as an aid in  the diagnosis of influenza from Nasopharyngeal swab specimens and  should not be used as a sole basis for treatment. Nasal washings and  aspirates are unacceptable for Xpert Xpress SARS-CoV-2/FLU/RSV  testing.  Fact Sheet for Patients: PinkCheek.be  Fact Sheet for Healthcare Providers: GravelBags.it  This test is not yet approved or cleared by the [place] FDA and  has been authorized for detection and/or diagnosis of SARS-CoV-2 by  FDA under an Emergency Use Authorization (EUA). This EUA will remain  in effect (meaning this test can be used) for the duration of the  Covid-19 declaration under Section 564(b)(1) of the Act, 21  U.S.C. section 360bbb-3(b)(1), unless the authorization is  terminated or revoked.    Respiratory Syncytial Virus by PCR NEGATIVE NEGATIVE Final    Comment: (NOTE) Fact Sheet for Patients: PinkCheek.be  Fact Sheet for Healthcare Providers: GravelBags.it  This test is not yet approved or cleared by the [place] FDA and  has been authorized for detection and/or diagnosis of SARS-CoV-2 by  FDA under an Emergency Use Authorization (EUA). This EUA will remain  in effect (meaning this test can be used) for the duration of the  COVID-19 declaration under Section 564(b)(1) of the Act, 21 U.S.C.  section 360bbb-3(b)(1), unless the authorization is terminated or  revoked. Performed at [facility], [address]  [place], [place] [address]       Studies: DG Lumbar Spine 2-3 Views  Result Date: 02/27/2020 CLINICAL DATA:  Fall, right hip pain EXAM: LUMBAR SPINE - 2-3 VIEW COMPARISON:  08/01/2013 FINDINGS: Five rib-bearing segments of the lumbar spine with diminutive ribs noted at T12. Normal lumbar lordosis. No acute fracture or listhesis the lumbar spine. Vertebral body height has been preserved. There is intervertebral disc space narrowing and endplate remodeling throughout the lumbar spine, most severe at L3-4 and L4-5, in keeping with changes of mild to moderate degenerative disc disease. This appears similar to prior examination. The paraspinal soft tissues are unremarkable. IMPRESSION: No acute fracture or listhesis of the lumbar spine. Electronically Signed   By: Fidela Salisbury MD  On: 02/27/2020 19:11   CT Lumbar Spine Wo Contrast  Result Date: 02/27/2020 CLINICAL DATA:  Fall with low back pain EXAM: CT LUMBAR SPINE WITHOUT CONTRAST TECHNIQUE: Multidetector CT imaging of the lumbar spine was performed without intravenous contrast administration. Multiplanar CT image reconstructions were also generated. COMPARISON:  None. FINDINGS: Segmentation: 5 lumbar type vertebrae. Alignment: Normal. Vertebrae: No acute fracture or focal pathologic process. Paraspinal and other soft tissues: Calcific aortic atherosclerosis. Disc levels: Multilevel facet arthrosis. No visible spinal canal stenosis or neural impingement. IMPRESSION: 1. No acute fracture or static subluxation of the lumbar spine. Aortic Atherosclerosis (ICD10-I70.0). Electronically Signed   By: Ulyses Jarred M.D.   On: 02/27/2020 19:59   CT Hip Right Wo Contrast  Result Date: 02/27/2020 CLINICAL DATA:  Hip trauma, fracture suspected, right hip pain following fall EXAM: CT OF THE RIGHT HIP WITHOUT CONTRAST TECHNIQUE: Multidetector CT imaging of the right hip was performed according to the standard protocol. Multiplanar CT image reconstructions were also  generated. COMPARISON:  None. FINDINGS: Bones/Joint/Cartilage Normal alignment. No acute fracture or dislocation. Moderate right hip degenerative arthritis with asymmetric joint space narrowing and subchondral sclerosis within the femoral head. No lytic or blastic bone lesion. Ligaments Suboptimally assessed by CT. Muscles and Tendons Dystrophic calcification within the hamstring origins likely represents the sequela of remote inflammation or trauma. Visualized musculature is otherwise unremarkable. Soft tissues Vascular calcifications noted within the visualized iliac vasculature. Soft tissues are otherwise unremarkable. IMPRESSION: No acute fracture or dislocation. Moderate degenerative arthritis. Electronically Signed   By: Fidela Salisbury MD   On: 02/27/2020 20:16   MR HIP RIGHT WO CONTRAST  Result Date: 02/28/2020 CLINICAL DATA:  [place]. Right hip pain. Negative x-rays. EXAM: MR OF THE RIGHT HIP WITHOUT CONTRAST TECHNIQUE: Multiplanar, multisequence MR imaging was performed. No intravenous contrast was administered. COMPARISON:  CT scan, same date. FINDINGS: Examination limited due to significant motion artifact. Both hips are normally located. No hip fracture or AVN. Mild bilateral degenerative changes for age. No hip joint effusion. No periarticular fluid collections. The pubic symphysis and SI joints are intact. No pelvic fractures or bone lesions. The hamstring tendons are torn from their attachment site on the ischium. Associated significant surrounding fluid/edema and hemorrhage. No significant intrapelvic abnormalities are identified. IMPRESSION: 1. No acute hip or pelvic fracture or AVN. 2. Torn right hamstring tendons from their attachment site on the ischium. 3. Mild bilateral hip joint degenerative changes for age. Electronically Signed   By: Marijo Sanes M.D.   On: 02/28/2020 05:12   DG Hip Unilat W or Wo Pelvis 2-3 Views Right  Result Date: 02/27/2020 CLINICAL DATA:  Fall, right hip pain  EXAM: DG HIP (WITH OR WITHOUT PELVIS) 2-3V RIGHT COMPARISON:  None. FINDINGS: Single view radiograph the pelvis and two view radiograph of the right hip demonstrates normal alignment. No fracture or dislocation. Mild right hip degenerative arthritis is present with joint space narrowing noted. Soft tissues are unremarkable. IMPRESSION: No acute fracture or dislocation. Electronically Signed   By: Fidela Salisbury MD   On: 02/27/2020 19:09    Scheduled Meds: . apixaban  5 mg Oral BID    Continuous Infusions: . sodium chloride 75 mL/hr at 02/28/20 0112  . cefTRIAXone (ROCEPHIN)  IV       LOS: 1 day     Alma Friendly, MD [facility]  If 7PM-7AM, please contact night-coverage [email or web address] 02/28/2020, 2:43 PM

## 2020-02-28 NOTE — Evaluation (Signed)
Occupational Therapy Evaluation Patient Details Name: Kirsten Snyder MRN: [id number] DOB: 1934-03-10 Today's Date: 02/28/2020    History of Present Illness 86 y.o. female with medical history significant of breast cancer, depression, GERD, gout, hyperlipidemia, hypertension, history of radiation therapy and asthma who lives alone at home.  Patient apparently sustained a mechanical fall a few days ago.  Since then she has been having significant pain in the lower back and right hip area.  Patient unable to walk or function even with her walker.  Came to the ER weak and debilitated.  Evaluation ongoing but no obvious fracture from plain films.  CT currently pending.  Patient found to have UTI which may be a precipitating event.  She has been admitted to the hospital for further evaluation.  She is currently sleepy.  Patient unable to give adequate history.  No underlying history of dementia but appears to be failing to thrive alone at home..   Clinical Impression   Patient presenting with decreased I in self care, balance, functional mobility/transfer, and endurance. Patient reports living with granddaughter at [facility] I level with use of rollator PTA. Pt reports granddaughter works during the day and she does not have 24/7 supervision/assist. Pt currently needing max A of 2 for bed mobility secondary to increased pain with movement. Pt needing maximal encouragement to participate. Pt standing with min A of 2 but does not transfer or take steps for pt/therapist safety. OT anticipates pt needing set up A - min A with UB self care and mod-max A for LB self care. Pt verbalized, " I'm thinking about myself trying to walk at home and I know I can't do it".Patient will benefit from acute OT to increase overall independence in the areas of ADLs, functional mobility, and safety awareness in order to safely discharge to next venue of care.    Follow Up Recommendations  SNF;Supervision/Assistance - 24 hour     Equipment Recommendations  Other (comment) (defer to next venue of care)       Precautions / Restrictions Precautions Precautions: Fall      Mobility Bed Mobility Overal bed mobility: Needs Assistance Bed Mobility: Supine to Sit;Sit to Supine     Supine to sit: Max assist;+2 for physical assistance;+2 for safety/equipment Sit to supine: Max assist;+2 for physical assistance;+2 for safety/equipment   General bed mobility comments: Pt very resistive and fearful of movement secondary to pain and needing maximal encouragement to participate    Transfers Overall transfer level: Needs assistance Equipment used: Rolling walker (2 wheeled) Transfers: Sit to/from Stand Sit to Stand: Min assist;+2 physical assistance;+2 safety/equipment         General transfer comment: Min A of 2 with min cuing for technique and hand placement. Functional transferred deferred for safety.    Balance Overall balance assessment: Needs assistance Sitting-balance support: Feet supported Sitting balance-Leahy Scale: Good Sitting balance - Comments: no LOB   Standing balance support: Bilateral upper extremity supported Standing balance-Leahy Scale: Fair Standing balance comment: reliance on RW for UE support       ADL either performed or assessed with clinical judgement   ADL Overall ADL's : Needs assistance/impaired     Grooming: Wash/dry hands;Wash/dry face;Oral care;Sitting;Set up;Supervision/safety        Lower Body Dressing: Moderate assistance;Maximal assistance;Bed level       Functional mobility during ADLs: +2 for safety/equipment;Min guard;Minimal assistance;Cueing for sequencing;Cueing for safety;Rolling walker General ADL Comments: Pt with increased pain during bed mobility - limiting pt's ability  to perform self care tasks.     Vision Baseline Vision/History: No visual deficits Patient Visual Report: No change from baseline              Pertinent Vitals/Pain Pain  Assessment: Faces Faces Pain Scale: Hurts whole lot Pain Location: back and R hip Pain Descriptors / Indicators: Aching;Discomfort;Grimacing;Guarding Pain Intervention(s): Limited activity within patient's tolerance;Monitored during session;Repositioned     Hand Dominance Right   Extremity/Trunk Assessment Upper Extremity Assessment Upper Extremity Assessment: Generalized weakness;Overall [facility] for tasks assessed   Lower Extremity Assessment Lower Extremity Assessment: Defer to PT evaluation       Communication Communication Communication: No difficulties   Cognition Arousal/Alertness: Awake/alert Behavior During Therapy: WFL for tasks assessed/performed Overall Cognitive Status: Within Functional Limits for tasks assessed                Home Living Family/patient expects to be discharged to:: Private residence Living Arrangements: Other relatives Available Help at Discharge: Family;Available PRN/intermittently (15 y/o grand child works during the day) Type of Home: House Home Access: Stairs to enter Technical brewer of Steps: 3 Entrance Stairs-Rails: Left Home Layout: One level     Bathroom Shower/Tub: Occupational psychologist: Standard Bathroom Accessibility: Yes   Home Equipment: Environmental consultant - 4 wheels;Walker - standard          Prior Functioning/Environment Level of Independence: Independent with assistive device(s)        Comments: Pt reports using rollator for ambulation PTA and independent with self care tasks        OT Problem List: Decreased strength;Pain;Decreased activity tolerance;Decreased safety awareness;Impaired balance (sitting and/or standing);Decreased knowledge of use of DME or AE;Decreased knowledge of precautions      OT Treatment/Interventions: Self-care/ADL training;Therapeutic exercise;Therapeutic activities;Energy conservation;DME and/or AE instruction;Patient/family education;Manual therapy;Balance training    OT  Goals(Current goals can be found in the care plan section) Acute Rehab OT Goals Patient Stated Goal: decrease pain OT Goal Formulation: With patient Time For Goal Achievement: 03/13/20 Potential to Achieve Goals: Good ADL Goals Pt Will Perform Grooming: with min assist;standing Pt Will Perform Lower Body Dressing: with min assist;sit to/from stand Pt Will Transfer to Toilet: with min assist;ambulating Pt Will Perform Toileting - Clothing Manipulation and hygiene: with min assist;sit to/from stand  OT Frequency: Min 1X/week   Barriers to D/C: Other (comment)  does not have 24/7 Assist at discharge       Co-evaluation PT/OT/SLP Co-Evaluation/Treatment: Yes Reason for Co-Treatment: Complexity of the patient's impairments (multi-system involvement);For patient/therapist safety;To address functional/ADL transfers PT goals addressed during session: Mobility/safety with mobility;Balance OT goals addressed during session: ADL's and self-care      AM-PAC OT "6 Clicks" Daily Activity     Outcome Measure Help from another person eating meals?: None Help from another person taking care of personal grooming?: None Help from another person toileting, which includes using toliet, bedpan, or urinal?: Total Help from another person bathing (including washing, rinsing, drying)?: A Lot Help from another person to put on and taking off regular upper body clothing?: A Little Help from another person to put on and taking off regular lower body clothing?: Total 6 Click Score: 15   End of Session Equipment Utilized During Treatment: Rolling walker Nurse Communication: Mobility status  Activity Tolerance: Patient limited by pain Patient left: in bed;with call bell/phone within reach;with family/visitor present  OT Visit Diagnosis: Repeated falls (R29.6);History of falling (Z91.81);Muscle weakness (generalized) (M62.81);Pain Pain - Right/Left: Right Pain - part of body:  Hip                Time:  0746-0029 OT Time Calculation (min): 27 min Charges:  OT General Charges $OT Visit: 1 Visit OT Evaluation $OT Eval Moderate Complexity: 1 Mod OT Treatments $Self Care/Home Management : 8-22 mins  Darleen Crocker, MS, OTR/L , CBIS ascom [phone]  02/28/20, 3:51 PM

## 2020-02-28 NOTE — ED Notes (Signed)
Pt given a cup of water per request.  

## 2020-02-29 LAB — CBC WITH DIFFERENTIAL/PLATELET
Abs Immature Granulocytes: 0.02 10*3/uL (ref 0.00–0.07)
Basophils Absolute: 0 10*3/uL (ref 0.0–0.1)
Basophils Relative: 0 %
Eosinophils Absolute: 0.2 10*3/uL (ref 0.0–0.5)
Eosinophils Relative: 4 %
HCT: 31.5 % — ABNORMAL LOW (ref 36.0–46.0)
Hemoglobin: 10.5 g/dL — ABNORMAL LOW (ref 12.0–15.0)
Immature Granulocytes: 0 %
Lymphocytes Relative: 32 %
Lymphs Abs: 1.7 10*3/uL (ref 0.7–4.0)
MCH: 31.3 pg (ref 26.0–34.0)
MCHC: 33.3 g/dL (ref 30.0–36.0)
MCV: 93.8 fL (ref 80.0–100.0)
Monocytes Absolute: 0.5 10*3/uL (ref 0.1–1.0)
Monocytes Relative: 10 %
Neutro Abs: 2.9 10*3/uL (ref 1.7–7.7)
Neutrophils Relative %: 54 %
Platelets: 202 10*3/uL (ref 150–400)
RBC: 3.36 MIL/uL — ABNORMAL LOW (ref 3.87–5.11)
RDW: 13.6 % (ref 11.5–15.5)
WBC: 5.5 10*3/uL (ref 4.0–10.5)
nRBC: 0 % (ref 0.0–0.2)

## 2020-02-29 LAB — BASIC METABOLIC PANEL
Anion gap: 8 (ref 5–15)
BUN: 18 mg/dL (ref 8–23)
CO2: 26 mmol/L (ref 22–32)
Calcium: 8.4 mg/dL — ABNORMAL LOW (ref 8.9–10.3)
Chloride: 102 mmol/L (ref 98–111)
Creatinine, Ser: 0.89 mg/dL (ref 0.44–1.00)
GFR, Estimated: >60 mL/min (ref >60)
Glucose, Bld: 95 mg/dL (ref 70–99)
Potassium: 3.9 mmol/L (ref 3.5–5.1)
Sodium: 136 mmol/L (ref 135–145)

## 2020-02-29 LAB — URINE CULTURE

## 2020-02-29 MED ORDER — CEFDINIR 300 MG PO CAPS
300.0000 mg | ORAL_CAPSULE | Freq: Two times a day (BID) | ORAL | Status: DC
  Administered 2020-03-01: 300 mg via ORAL
  Filled 2020-02-29 (×2): qty 1

## 2020-02-29 MED ORDER — OXYCODONE HCL 5 MG PO TABS
5.0000 mg | ORAL_TABLET | Freq: Four times a day (QID) | ORAL | Status: DC | PRN
  Administered 2020-02-29 – 2020-03-01 (×4): 5 mg via ORAL
  Filled 2020-02-29 (×5): qty 1

## 2020-02-29 NOTE — Progress Notes (Signed)
PROGRESS NOTE  DANIEL JOHNDROW GEZ:[id number] DOB: July 25, 1933 DOA: 02/27/2020 PCP: [name], MD  HPI/Recap of past 24 hours: HPI from [name] is a 86 y.o. female with medical history significant of breast cancer, depression, GERD, gout, hyperlipidemia, hypertension, history of radiation therapy and asthma who lives alone at home. Patient apparently sustained a mechanical fall a day PTA, has since been having significant pain in the lower back and right hip area. Patient unable to walk or function even with her walker.  Came to the ER weak and debilitated.  Evaluation ongoing but no obvious fracture from imaging. Patient found to have UTI (c/o burning urination) which may be a precipitating event.  She has been admitted to the hospital for further evaluation. In the ED, temperature is 98.2 blood pressure 143/73 pulse 89 respiratory 14, oxygen sat 91% on room air.  White count 7.2 hemoglobin 11.9 platelets 223.  Chemistry showed calcium 8.7 glucose 180 the rest appeared to be within normal.  Urinalysis shows cloudy urine was large leukocytes WBC more than 50 with rare bacteria.  CT of the hip and lumbar spine showed no acute fracture or dislocation and moderate degenerative arthritis. MRI of the right hip showed no fracture, but showed torn right hamstring tendons from the attachment site on the ischium. Patient is still unable to add weight to her feet.  Patient admitted for further management.     Today, patient still complains of significant right hip/posterior thigh pain, denies any other complaints, denies any nausea/vomiting fever/chills, abdominal pain, chest pain, shortness of breath.     Assessment/Plan: Principal Problem:   UTI (urinary tract infection) Active Problems:   Acid reflux   Essential hypertension   Fall at home, initial encounter   Torn right hamstring tendon s/p mechanical fall Reports right hip/posterior thigh pain MRI right hip showed no  fracture, but showed torn right hamstring tendons from the attachment site of the ischium Discussed with Orthopedics Dr Roland Rack on 02/28/2020, non-surgical management, WBAT, ice pack, ambulate with walker Pain management PT/OT  Possible UTI Patient reports dysuria UA showed large leukocytes, greater than 50 WBC, rare bacteria UC with multiple species S/p Rocephin, switch to cefdinir to complete 5 days of antibiotics  Hypertension Not on any medications at home BP stable  History of PE and DVT in 7/21 Continue Eliquis  S/p meningioma resection 08/2019 [facility]  GERD Continue PPI   Asthma Continue inhaler  Obesity Lifestyle modification advised       Malnutrition Type:      Malnutrition Characteristics:      Nutrition Interventions:       Estimated body mass index is 39.48 kg/m as calculated from the following:   Height as of this encounter: 5\' 4"  (1.626 m).   Weight as of this encounter: 104.3 kg.     Code Status: Full  Family Communication: Discussed with daughter at bedside on 02/28/20  Disposition Plan: Status is: Inpatient  Remains inpatient appropriate because:Inpatient level of care appropriate due to severity of illness   Dispo: The patient is from: Home              Anticipated d/c is to: SNF              Anticipated d/c date is: 1 day              Patient currently is not medically stable to d/c.    Consultants:  Discussed with orthopedics  Procedures: None  Antimicrobials:  Rocephin-->omnicef   DVT prophylaxis: Eliquis   Objective: Vitals:   02/29/20 0012 02/29/20 0419 02/29/20 0801 02/29/20 1209  BP: (!) 142/65 (!) 150/72 127/69 136/60  Pulse: 74 75 73 70  Resp: 20 20 20 18   Temp: 98.3 F (36.8 C) 99.3 F (37.4 C) 97.7 F (36.5 C) 97.7 F (36.5 C)  TempSrc: Oral  Oral   SpO2: 95% 95% 94% 94%  Weight:      Height:        Intake/Output Summary (Last 24 hours) at 02/29/2020 1353 Last data filed at  02/29/2020 1038 Gross per 24 hour  Intake 1611.01 ml  Output 1000 ml  Net 611.01 ml   Filed Weights   02/27/20 1809  Weight: 104.3 kg    Exam:  General: NAD   Cardiovascular: S1, S2 present  Respiratory: CTAB  Abdomen: Soft, nontender, nondistended, bowel sounds present  Musculoskeletal: No bilateral pedal edema noted, decreased range of motion of the right leg/hip area, tenderness around thigh, strength 5/5 in all extremities  Skin: Normal  Psychiatry: Normal mood    Data Reviewed: CBC: Recent Labs  Lab 02/27/20 1825 02/28/20 0347 02/29/20 0507  WBC 7.2 6.5 5.5  NEUTROABS 5.2  --  2.9  HGB 11.9* 11.3* 10.5*  HCT 35.7* 33.6* 31.5*  MCV 94.7 93.9 93.8  PLT 223 209 643   Basic Metabolic Panel: Recent Labs  Lab 02/27/20 1825 02/28/20 0347 02/29/20 0507  NA 137 137 136  K 4.1 3.9 3.9  CL 99 100 102  CO2 28 28 26   GLUCOSE 118* 116* 95  BUN 17 15 18   CREATININE 0.74 0.73 0.89  CALCIUM 8.7* 8.5* 8.4*   GFR: Estimated Creatinine Clearance: 55.3 mL/min (by C-G formula based on SCr of 0.89 mg/dL). Liver Function Tests: Recent Labs  Lab 02/28/20 0347  AST 16  ALT 10  ALKPHOS 57  BILITOT 1.4*  PROT 5.9*  ALBUMIN 3.4*   No results for input(s): LIPASE, AMYLASE in the last 168 hours. No results for input(s): AMMONIA in the last 168 hours. Coagulation Profile: No results for input(s): INR, PROTIME in the last 168 hours. Cardiac Enzymes: No results for input(s): CKTOTAL, CKMB, CKMBINDEX, TROPONINI in the last 168 hours. BNP (last 3 results) No results for input(s): PROBNP in the last 8760 hours. HbA1C: No results for input(s): HGBA1C in the last 72 hours. CBG: No results for input(s): GLUCAP in the last 168 hours. Lipid Profile: No results for input(s): CHOL, HDL, LDLCALC, TRIG, CHOLHDL, LDLDIRECT in the last 72 hours. Thyroid Function Tests: No results for input(s): TSH, T4TOTAL, FREET4, T3FREE, THYROIDAB in the last 72 hours. Anemia Panel: No  results for input(s): VITAMINB12, FOLATE, FERRITIN, TIBC, IRON, RETICCTPCT in the last 72 hours. Urine analysis:    Component Value Date/Time   COLORURINE YELLOW (A) 02/27/2020 2204   APPEARANCEUR CLOUDY (A) 02/27/2020 2204   LABSPEC 1.011 02/27/2020 2204   PHURINE 8.0 02/27/2020 2204   GLUCOSEU NEGATIVE 02/27/2020 2204   HGBUR SMALL (A) 02/27/2020 2204   BILIRUBINUR NEGATIVE 02/27/2020 2204   BILIRUBINUR negative 01/03/2020 1041   [place] 02/27/2020 2204   PROTEINUR 30 (A) 02/27/2020 2204   UROBILINOGEN 0.2 01/03/2020 1041   NITRITE NEGATIVE 02/27/2020 2204   LEUKOCYTESUR LARGE (A) 02/27/2020 2204   Sepsis Labs: @LABRCNTIP (procalcitonin:4,lacticidven:4)  ) Recent Results (from the past 240 hour(s))  Urine culture     Status: Abnormal   Collection Time: 02/27/20 10:04 PM   Specimen: Urine, Random  Result Value Ref Range Status   Specimen Description   Final    URINE, RANDOM Performed at [facility], [address] [place], [place] [address]    Special Requests   Final    NONE Performed at [facility], [address] [place],  [address]    Culture MULTIPLE SPECIES PRESENT, SUGGEST RECOLLECTION (A)  Final   Report Status 02/29/2020 FINAL  Final  Resp Panel by RT PCR (RSV, Flu A&B, Covid) - Nasopharyngeal Swab     Status: None   Collection Time: 02/28/20  3:47 AM   Specimen: Nasopharyngeal Swab  Result Value Ref Range Status   SARS Coronavirus 2 by RT PCR NEGATIVE NEGATIVE Final    Comment: (NOTE) SARS-CoV-2 target nucleic acids are NOT DETECTED.  The SARS-CoV-2 RNA is generally detectable in upper respiratoy specimens during the acute phase of infection. The lowest concentration of SARS-CoV-2 viral copies this assay can detect is 131 copies/mL. A negative result does not preclude SARS-Cov-2 infection and should not be used as the sole basis for treatment or other patient management decisions. A negative result may occur with    improper specimen collection/handling, submission of specimen other than nasopharyngeal swab, presence of viral mutation(s) within the areas targeted by this assay, and inadequate number of viral copies (<131 copies/mL). A negative result must be combined with clinical observations, patient history, and epidemiological information. The expected result is Negative.  Fact Sheet for Patients:  PinkCheek.be  Fact Sheet for Healthcare Providers:  GravelBags.it  This test is no t yet approved or cleared by the [place] FDA and  has been authorized for detection and/or diagnosis of SARS-CoV-2 by FDA under an Emergency Use Authorization (EUA). This EUA will remain  in effect (meaning this test can be used) for the duration of the COVID-19 declaration under Section 564(b)(1) of the Act, 21 U.S.C. section 360bbb-3(b)(1), unless the authorization is terminated or revoked sooner.     Influenza A by PCR NEGATIVE NEGATIVE Final   Influenza B by PCR NEGATIVE NEGATIVE Final    Comment: (NOTE) The Xpert Xpress SARS-CoV-2/FLU/RSV assay is intended as an aid in  the diagnosis of influenza from Nasopharyngeal swab specimens and  should not be used as a sole basis for treatment. Nasal washings and  aspirates are unacceptable for Xpert Xpress SARS-CoV-2/FLU/RSV  testing.  Fact Sheet for Patients: PinkCheek.be  Fact Sheet for Healthcare Providers: GravelBags.it  This test is not yet approved or cleared by the [place] FDA and  has been authorized for detection and/or diagnosis of SARS-CoV-2 by  FDA under an Emergency Use Authorization (EUA). This EUA will remain  in effect (meaning this test can be used) for the duration of the  Covid-19 declaration under Section 564(b)(1) of the Act, 21  U.S.C. section 360bbb-3(b)(1), unless the authorization is  terminated or revoked.     Respiratory Syncytial Virus by PCR NEGATIVE NEGATIVE Final    Comment: (NOTE) Fact Sheet for Patients: PinkCheek.be  Fact Sheet for Healthcare Providers: GravelBags.it  This test is not yet approved or cleared by the [place] FDA and  has been authorized for detection and/or diagnosis of SARS-CoV-2 by  FDA under an Emergency Use Authorization (EUA). This EUA will remain  in effect (meaning this test can be used) for the duration of the  COVID-19 declaration under Section 564(b)(1) of the Act, 21 U.S.C.  section 360bbb-3(b)(1), unless the authorization is terminated or  revoked. Performed at [facility], [address] [place], [place] [address]       Studies: No results found.  Scheduled Meds: . allopurinol  100 mg Oral Daily  . apixaban  5 mg Oral BID  . [START ON 03/01/2020] cefdinir  300 mg Oral Q12H  . ferrous sulfate  325 mg Oral Q breakfast  . fluticasone furoate-vilanterol  1 puff Inhalation Daily   And  . umeclidinium bromide  1 puff Inhalation Daily  . pantoprazole  40 mg Oral Daily  . sertraline  50 mg Oral Daily    Continuous Infusions: . cefTRIAXone (ROCEPHIN)  IV Stopped (02/28/20 1858)     LOS: 2 days     Alma Friendly, MD [facility]  If 7PM-7AM, please contact night-coverage [email or web address] 02/29/2020, 1:53 PM

## 2020-02-29 NOTE — NC FL2 (Signed)
[place] LEVEL OF CARE SCREENING TOOL     IDENTIFICATION  Patient Name: Kirsten Snyder Birthdate: September 19, 1933 Sex: female Admission Date (Current Location): 02/27/2020  [place] and [place] Number:  Engineering geologist and Address:  [facility], [address] [place], [place] [address]      Provider Number: [id number]  Attending Physician Name and Address:  Alma Friendly, MD  Relative Name and Phone Number:  Jamse Arn (Daughter) [phone] (Work Phone)    Current Level of Care: Hospital Recommended Level of Care: [place] Prior Approval Number:    Date Approved/Denied:   PASRR Number: [id number]  Discharge Plan: SNF    Current Diagnoses: Patient Active Problem List   Diagnosis Date Noted  . UTI (urinary tract infection) 02/27/2020  . Fall at home, initial encounter 02/27/2020  . Sepsis secondary to UTI ([place]) 10/01/2019  . Sepsis ([place]) 10/01/2019  . Hypokalemia 10/01/2019  . Atherosclerosis of abdominal aorta ([place]) 10/12/2018  . SOBOE (shortness of breath on exertion) 10/12/2018  . Osteopenia of neck of right femur 06/29/2018  . Breast cancer, right ([place]) 06/17/2016  . Essential hypertension 12/13/2015  . Breast abscess of female 11/07/2015  . Familial multiple lipoprotein-type hyperlipidemia 10/25/2014  . Clinical depression 10/25/2014  . Acid reflux 10/25/2014  . Acute gastrointestinal bleeding 10/25/2014  . Aggrieved 10/25/2014  . Airway hyperreactivity 10/25/2014  . Arthritis of knee 10/25/2014  . H/O: gout 10/25/2014  . Adiposity 10/25/2014  . Arthritis, degenerative 10/25/2014  . H/O adenomatous polyp of colon 03/07/2014    Orientation RESPIRATION BLADDER Height & Weight     Self, Time, Situation, Place  Normal Incontinent Weight: 230 lb (104.3 kg) Height:  5\' 4"  (162.6 cm)  BEHAVIORAL SYMPTOMS/MOOD NEUROLOGICAL BOWEL NUTRITION STATUS      Continent Diet (heart diet; thin  liquids)  AMBULATORY STATUS COMMUNICATION OF NEEDS Skin   Extensive Assist Verbally  (skin tags and bunion on bilateral big toe)                       Personal Care Assistance Level of Assistance  Bathing, Feeding, Dressing Bathing Assistance: Maximum assistance Feeding assistance: Independent Dressing Assistance: Maximum assistance     Functional Limitations Info             SPECIAL CARE FACTORS FREQUENCY  OT (By licensed OT), PT (By licensed PT)     PT Frequency: 5 x/week OT Frequency: 5 x/week            Contractures      Additional Factors Info  Code Status, Allergies Code Status Info: full code Allergies Info: sulfa antibiotics           Current Medications (02/29/2020):  This is the current hospital active medication list Current Facility-Administered Medications  Medication Dose Route Frequency Provider Last Rate Last Admin  . acetaminophen (TYLENOL) tablet 650 mg  650 mg Oral Q6H PRN Elwyn Reach, MD       Or  . acetaminophen (TYLENOL) suppository 650 mg  650 mg Rectal Q6H PRN Garba, Mohammad L, MD      . albuterol (VENTOLIN HFA) 108 (90 Base) MCG/ACT inhaler 2 puff  2 puff Inhalation Q6H PRN Alma Friendly, MD      . allopurinol (ZYLOPRIM) tablet 100 mg  100 mg Oral Daily Alma Friendly, MD   100 mg at 02/29/20 0915  . apixaban (ELIQUIS) tablet 5 mg  5 mg Oral BID  Elwyn Reach, MD   5 mg at 02/29/20 0916  . [START ON 03/01/2020] cefdinir (OMNICEF) capsule 300 mg  300 mg Oral Q12H Alma Friendly, MD      . cefTRIAXone (ROCEPHIN) 1 g in sodium chloride 0.9 % 100 mL IVPB  1 g Intravenous Q24H Alma Friendly, MD   Stopped at 02/28/20 1858  . ferrous sulfate tablet 325 mg  325 mg Oral Q breakfast Alma Friendly, MD   325 mg at 02/29/20 0915  . fluticasone furoate-vilanterol (BREO ELLIPTA) 100-25 MCG/INH 1 puff  1 puff Inhalation Daily Rowland Lathe, RPH   1 puff at 02/29/20 0800   And  . umeclidinium bromide  (INCRUSE ELLIPTA) 62.5 MCG/INH 1 puff  1 puff Inhalation Daily Rowland Lathe, RPH   1 puff at 02/29/20 0800  . ketorolac (TORADOL) 30 MG/ML injection 15 mg  15 mg Intravenous Q6H PRN Elwyn Reach, MD   15 mg at 02/29/20 0431  . ondansetron (ZOFRAN) tablet 4 mg  4 mg Oral Q6H PRN Elwyn Reach, MD       Or  . ondansetron (ZOFRAN) injection 4 mg  4 mg Intravenous Q6H PRN Gala Romney L, MD      . oxyCODONE (Oxy IR/ROXICODONE) immediate release tablet 5 mg  5 mg Oral Q6H PRN Alma Friendly, MD   5 mg at 02/29/20 0915  . pantoprazole (PROTONIX) EC tablet 40 mg  40 mg Oral Daily Alma Friendly, MD   40 mg at 02/29/20 0915  . sertraline (ZOLOFT) tablet 50 mg  50 mg Oral Daily Alma Friendly, MD   50 mg at 02/29/20 6579     Discharge Medications: Please see discharge summary for a list of discharge medications.  Relevant Imaging Results:  Relevant Lab Results:   Additional Information [id number]  [place], LCSW

## 2020-02-29 NOTE — TOC Initial Note (Addendum)
Transition of Care [facility]) - Initial/Assessment Note    Patient Details  Name: Kirsten Snyder MRN: [id number] Date of Birth: 02-Feb-1934  Transition of Care [facility]) CM/SW Contact:    [name], LCSW Phone Number: 02/29/2020, 1:26 PM  Clinical Narrative:                CSW spoke with patient and patient's daughter. Patient lives with granddaughter. Son transports to appointments. Patient has a RW. No HH history. Patient went to [facility] in the past for rehab. [facility] is [facility]. PCP is Dr. [name]. CSW informed patient and daughter of SNF recommendation. They are agreeable to SNF at discharge. Patient reported she has her COVID vaccines. Their first choice would be [facility], second choice is [facility] in [place] did SNF work up. Reached out to [place] with [facility] about bed offer.   [facility]. Spoke to [facility]. She said she has to do an Eligibility Ticket request and will call CSW back to let CSW know if patient is managed by [facility] by the end of the day today.  2:25- [name] with [facility] unable to take patients until next Thursday. Per MD, patient will be ready before then. Will reach out to second choice [facility].    3:10- Call from [place] who reported they do not manage patient, so SNF will have to start auth. [name] with [facility] reported they can make a bed offer for patient and will start auth today. Updated daughter via voicemail.   Expected Discharge Plan: Skilled Nursing Facility Barriers to Discharge: Continued Medical Work up   Patient Goals and CMS Choice Patient states their goals for this hospitalization and ongoing recovery are:: SNF rehab CMS Medicare.gov Compare Post Acute Care list provided to:: Patient Choice offered to / list presented to : Patient, Adult Children  Expected Discharge Plan and Services Expected Discharge Plan: [place] arrangements  for the past 2 months: Single Family Home                                      Prior Living Arrangements/Services Living arrangements for the past 2 months: Single Family Home Lives with:: Other (Comment) (granddaughter) Patient language and need for interpreter reviewed:: Yes Do you feel safe going back to the place where you live?: Yes      Need for Family Participation in Patient Care: Yes (Comment) Care giver support system in place?: Yes (comment) Current home services: DME Criminal Activity/Legal Involvement Pertinent to Current Situation/Hospitalization: No - Comment as needed  Activities of Daily Living Home Assistive Devices/Equipment: Walker (specify type), Eyeglasses ADL Screening (condition at time of admission) Patient's cognitive ability adequate to safely complete daily activities?: Yes Is the patient deaf or have difficulty hearing?: No Does the patient have difficulty seeing, even when wearing glasses/contacts?: No Does the patient have difficulty concentrating, remembering, or making decisions?: No Patient able to express need for assistance with ADLs?: Yes Does the patient have difficulty dressing or bathing?: No Independently performs ADLs?: Yes (appropriate for developmental age) Does the patient have difficulty walking or climbing stairs?: Yes Weakness of Legs: Both Weakness of Arms/Hands: None  Permission Sought/Granted Permission sought to share information with : Facility Art therapist granted to share information with : Yes, Verbal Permission Granted  Permission granted to share info w AGENCY: SNFs        Emotional Assessment       Orientation: : Oriented to Situation, Oriented to  Time, Oriented to Place, Oriented to Self Alcohol / Substance Use: Not Applicable Psych Involvement: No (comment)  Admission diagnosis:  UTI (urinary tract infection) [N39.0] Acute cystitis without hematuria [N30.00] Generalized  weakness [R53.1] Fall, initial encounter [W19.XXXA] Patient Active Problem List   Diagnosis Date Noted  . UTI (urinary tract infection) 02/27/2020  . Fall at home, initial encounter 02/27/2020  . Sepsis secondary to UTI ([place]) 10/01/2019  . Sepsis ([place]) 10/01/2019  . Hypokalemia 10/01/2019  . Atherosclerosis of abdominal aorta ([place]) 10/12/2018  . SOBOE (shortness of breath on exertion) 10/12/2018  . Osteopenia of neck of right femur 06/29/2018  . Breast cancer, right ([place]) 06/17/2016  . Essential hypertension 12/13/2015  . Breast abscess of female 11/07/2015  . Familial multiple lipoprotein-type hyperlipidemia 10/25/2014  . Clinical depression 10/25/2014  . Acid reflux 10/25/2014  . Acute gastrointestinal bleeding 10/25/2014  . Aggrieved 10/25/2014  . Airway hyperreactivity 10/25/2014  . Arthritis of knee 10/25/2014  . H/O: gout 10/25/2014  . Adiposity 10/25/2014  . Arthritis, degenerative 10/25/2014  . H/O adenomatous polyp of colon 03/07/2014   PCP:  [name], MD Pharmacy:   [facility] [address] [place] [address] Phone: [phone] Fax: [facility] [place], [place] [address] Phone: [phone] Fax: [phone]     Social Determinants of Health (SDOH) Interventions    Readmission Risk Interventions No flowsheet data found.

## 2020-02-29 NOTE — Progress Notes (Signed)
Physical Therapy Treatment Patient Details Name: Kirsten Snyder MRN: [id number] DOB: 05/17/1933 Today's Date: 02/29/2020    History of Present Illness 86 y.o. female with medical history significant of breast cancer, depression, GERD, gout, hyperlipidemia, hypertension, history of radiation therapy and asthma who lives alone at home.  Patient apparently sustained a mechanical fall a few days ago.  Since then she has been having significant pain in the lower back and right hip area.  Patient unable to walk or function even with her walker.  Came to the ER weak and debilitated.  Evaluation ongoing but no obvious fracture from plain films.  MR of R hip showed torn right hamstring tendons from their attachment site on the ischium with no acute hip or pelvic fracture or AVN.Marland Kitchen  Patient found to have UTI which may be a precipitating event.  She has been admitted to the hospital for further evaluation.  Patient unable to give adequate history.  No underlying history of dementia but appears to be failing to thrive alone at home..    PT Comments    Pt received in low bed awake and watching TV. Pt agreeable to participate in PT session. Pt required assistance to advance RLE towards edge of bed. Mod-max A provided for truncal elevation into sitting. Pt approximately 80-90% of sitting upright and pt would yell in pain and return to supine position immediately. Pt requested increased time to try to attempt again and despite repositioning RLE and trying different attempts (sidelying to sit, using LLE under RLE, clinician manual repositioning), pt unable to tolerate edge of bed sitting. Multiple attempts made and pt never able to reach full upright sitting position. Pt able to reposition herself in bed with increased verbal cues for sequencing. Pt very limited due to pain this session. Will continue to follow and progress as tolerated.   Follow Up Recommendations  SNF     Equipment Recommendations  Rolling walker  with 5" wheels    Recommendations for Other Services       Precautions / Restrictions Precautions Precautions: Fall Restrictions Weight Bearing Restrictions: No    Mobility  Bed Mobility Overal bed mobility: Needs Assistance Bed Mobility: Supine to Sit;Sit to Supine;Sidelying to Sit   Sidelying to sit: Min assist Supine to sit: Mod assist;Max assist Sit to supine: Min guard   General bed mobility comments: Mod-max A for trucal elevation into sitting; pt instructed to attempt sidelying to sit with no ease in pain  Transfers                 General transfer comment: not able to perform  Ambulation/Gait             General Gait Details: not able to perform   Stairs             Wheelchair Mobility    Modified Rankin (Stroke Patients Only)       Balance Overall balance assessment: Needs assistance Sitting-balance support: Feet supported Sitting balance-Leahy Scale: Fair Sitting balance - Comments: pt unable to reach full upright sitting lying right back dow secondary to pain       Standing balance comment: not performed                            Cognition Arousal/Alertness: Awake/alert Behavior During Therapy: WFL for tasks assessed/performed Overall Cognitive Status: Within Functional Limits for tasks assessed  Exercises      General Comments        Pertinent Vitals/Pain Pain Assessment: Faces Faces Pain Scale: Hurts worst Pain Location: R hip and posterior thigh Pain Descriptors / Indicators: Aching;Discomfort;Grimacing;Guarding;Moaning Pain Intervention(s): Monitored during session;Repositioned;Limited activity within patient's tolerance    Home Living                      Prior Function            PT Goals (current goals can now be found in the care plan section) Acute Rehab PT Goals Patient Stated Goal: decrease pain PT Goal Formulation: With  patient Time For Goal Achievement: 03/13/20 Potential to Achieve Goals: Good Progress towards PT goals: Progressing toward goals    Frequency    Min 2X/week      PT Plan Current plan remains appropriate    Co-evaluation              AM-PAC PT "6 Clicks" Mobility   Outcome Measure  Help needed turning from your back to your side while in a flat bed without using bedrails?: A Lot Help needed moving from lying on your back to sitting on the side of a flat bed without using bedrails?: A Lot Help needed moving to and from a bed to a chair (including a wheelchair)?: A Lot Help needed standing up from a chair using your arms (e.g., wheelchair or bedside chair)?: A Lot Help needed to walk in hospital room?: A Lot Help needed climbing 3-5 steps with a railing? : Total 6 Click Score: 11    End of Session   Activity Tolerance: Patient limited by pain Patient left: in bed;with call bell/phone within reach;with bed alarm set Nurse Communication: Mobility status PT Visit Diagnosis: Unsteadiness on feet (R26.81);Other abnormalities of gait and mobility (R26.89);Muscle weakness (generalized) (M62.81);History of falling (Z91.81);Pain Pain - Right/Left: Right Pain - part of body: Leg;Hip     Time: 1017-5102 PT Time Calculation (min) (ACUTE ONLY): 32 min  Charges:                        Vale Haven, SPT   Vale Haven 02/29/2020, 5:12 PM

## 2020-02-29 NOTE — Plan of Care (Signed)
  Problem: Education: Goal: Knowledge of General Education information will improve Description: Including pain rating scale, medication(s)/side effects and non-pharmacologic comfort measures 02/29/2020 2237 by Horton Finer, RN Outcome: Progressing 02/29/2020 2237 by Horton Finer, RN Outcome: Progressing   Problem: Health Behavior/Discharge Planning: Goal: Ability to manage health-related needs will improve 02/29/2020 2237 by Horton Finer, RN Outcome: Progressing 02/29/2020 2237 by Horton Finer, RN Outcome: Progressing   Problem: Clinical Measurements: Goal: Ability to maintain clinical measurements within normal limits will improve 02/29/2020 2237 by Horton Finer, RN Outcome: Progressing 02/29/2020 2237 by Horton Finer, RN Outcome: Progressing Goal: Will remain free from infection 02/29/2020 2237 by Horton Finer, RN Outcome: Progressing 02/29/2020 2237 by Horton Finer, RN Outcome: Progressing Goal: Diagnostic test results will improve 02/29/2020 2237 by Horton Finer, RN Outcome: Progressing 02/29/2020 2237 by Horton Finer, RN Outcome: Progressing Goal: Respiratory complications will improve 02/29/2020 2237 by Horton Finer, RN Outcome: Progressing 02/29/2020 2237 by Horton Finer, RN Outcome: Progressing Goal: Cardiovascular complication will be avoided 02/29/2020 2237 by Horton Finer, RN Outcome: Progressing 02/29/2020 2237 by Horton Finer, RN Outcome: Progressing   Problem: Activity: Goal: Risk for activity intolerance will decrease 02/29/2020 2237 by Horton Finer, RN Outcome: Progressing 02/29/2020 2237 by Horton Finer, RN Outcome: Progressing   Problem: Nutrition: Goal: Adequate nutrition will be maintained 02/29/2020 2237 by Horton Finer, RN Outcome: Progressing 02/29/2020 2237 by Horton Finer, RN Outcome: Progressing   Problem: Coping: Goal: Level of anxiety will decrease 02/29/2020  2237 by Horton Finer, RN Outcome: Progressing 02/29/2020 2237 by Horton Finer, RN Outcome: Progressing   Problem: Elimination: Goal: Will not experience complications related to bowel motility 02/29/2020 2237 by Horton Finer, RN Outcome: Progressing 02/29/2020 2237 by Horton Finer, RN Outcome: Progressing Goal: Will not experience complications related to urinary retention 02/29/2020 2237 by Horton Finer, RN Outcome: Progressing 02/29/2020 2237 by Horton Finer, RN Outcome: Progressing   Problem: Pain Managment: Goal: General experience of comfort will improve 02/29/2020 2237 by Horton Finer, RN Outcome: Progressing 02/29/2020 2237 by Horton Finer, RN Outcome: Progressing   Problem: Safety: Goal: Ability to remain free from injury will improve 02/29/2020 2237 by Horton Finer, RN Outcome: Progressing 02/29/2020 2237 by Horton Finer, RN Outcome: Progressing   Problem: Skin Integrity: Goal: Risk for impaired skin integrity will decrease 02/29/2020 2237 by Horton Finer, RN Outcome: Progressing 02/29/2020 2237 by Horton Finer, RN Outcome: Progressing   Problem: Urinary Elimination: Goal: Signs and symptoms of infection will decrease 02/29/2020 2237 by Horton Finer, RN Outcome: Progressing 02/29/2020 2237 by Horton Finer, RN Outcome: Progressing

## 2020-03-01 DIAGNOSIS — M25561 Pain in right knee: Secondary | ICD-10-CM | POA: Diagnosis not present

## 2020-03-01 DIAGNOSIS — I10 Essential (primary) hypertension: Secondary | ICD-10-CM | POA: Diagnosis not present

## 2020-03-01 DIAGNOSIS — M109 Gout, unspecified: Secondary | ICD-10-CM | POA: Diagnosis not present

## 2020-03-01 DIAGNOSIS — R279 Unspecified lack of coordination: Secondary | ICD-10-CM | POA: Diagnosis not present

## 2020-03-01 DIAGNOSIS — R21 Rash and other nonspecific skin eruption: Secondary | ICD-10-CM | POA: Diagnosis not present

## 2020-03-01 DIAGNOSIS — K649 Unspecified hemorrhoids: Secondary | ICD-10-CM | POA: Diagnosis not present

## 2020-03-01 DIAGNOSIS — N3000 Acute cystitis without hematuria: Secondary | ICD-10-CM | POA: Diagnosis not present

## 2020-03-01 DIAGNOSIS — I4891 Unspecified atrial fibrillation: Secondary | ICD-10-CM | POA: Diagnosis not present

## 2020-03-01 DIAGNOSIS — W19XXXA Unspecified fall, initial encounter: Secondary | ICD-10-CM | POA: Diagnosis not present

## 2020-03-01 DIAGNOSIS — R52 Pain, unspecified: Secondary | ICD-10-CM | POA: Diagnosis not present

## 2020-03-01 DIAGNOSIS — K5900 Constipation, unspecified: Secondary | ICD-10-CM | POA: Diagnosis not present

## 2020-03-01 DIAGNOSIS — S76301A Unspecified injury of muscle, fascia and tendon of the posterior muscle group at thigh level, right thigh, initial encounter: Secondary | ICD-10-CM | POA: Diagnosis not present

## 2020-03-01 DIAGNOSIS — J302 Other seasonal allergic rhinitis: Secondary | ICD-10-CM | POA: Diagnosis not present

## 2020-03-01 DIAGNOSIS — Z79899 Other long term (current) drug therapy: Secondary | ICD-10-CM | POA: Diagnosis not present

## 2020-03-01 DIAGNOSIS — R5381 Other malaise: Secondary | ICD-10-CM | POA: Diagnosis not present

## 2020-03-01 DIAGNOSIS — Y92009 Unspecified place in unspecified non-institutional (private) residence as the place of occurrence of the external cause: Secondary | ICD-10-CM | POA: Diagnosis not present

## 2020-03-01 DIAGNOSIS — M1711 Unilateral primary osteoarthritis, right knee: Secondary | ICD-10-CM | POA: Diagnosis not present

## 2020-03-01 DIAGNOSIS — M6281 Muscle weakness (generalized): Secondary | ICD-10-CM | POA: Diagnosis not present

## 2020-03-01 DIAGNOSIS — K219 Gastro-esophageal reflux disease without esophagitis: Secondary | ICD-10-CM | POA: Diagnosis not present

## 2020-03-01 DIAGNOSIS — N390 Urinary tract infection, site not specified: Secondary | ICD-10-CM | POA: Diagnosis not present

## 2020-03-01 DIAGNOSIS — M25551 Pain in right hip: Secondary | ICD-10-CM | POA: Diagnosis not present

## 2020-03-01 LAB — CBC WITH DIFFERENTIAL/PLATELET
Abs Immature Granulocytes: 0.01 10*3/uL (ref 0.00–0.07)
Basophils Absolute: 0 10*3/uL (ref 0.0–0.1)
Basophils Relative: 1 %
Eosinophils Absolute: 0.2 10*3/uL (ref 0.0–0.5)
Eosinophils Relative: 4 %
HCT: 33 % — ABNORMAL LOW (ref 36.0–46.0)
Hemoglobin: 11 g/dL — ABNORMAL LOW (ref 12.0–15.0)
Immature Granulocytes: 0 %
Lymphocytes Relative: 29 %
Lymphs Abs: 1.6 10*3/uL (ref 0.7–4.0)
MCH: 31.5 pg (ref 26.0–34.0)
MCHC: 33.3 g/dL (ref 30.0–36.0)
MCV: 94.6 fL (ref 80.0–100.0)
Monocytes Absolute: 0.5 10*3/uL (ref 0.1–1.0)
Monocytes Relative: 10 %
Neutro Abs: 3 10*3/uL (ref 1.7–7.7)
Neutrophils Relative %: 56 %
Platelets: 224 10*3/uL (ref 150–400)
RBC: 3.49 MIL/uL — ABNORMAL LOW (ref 3.87–5.11)
RDW: 13.7 % (ref 11.5–15.5)
WBC: 5.3 10*3/uL (ref 4.0–10.5)
nRBC: 0 % (ref 0.0–0.2)

## 2020-03-01 LAB — COMPREHENSIVE METABOLIC PANEL
ALT: 9 U/L (ref 0–44)
AST: 14 U/L — ABNORMAL LOW (ref 15–41)
Albumin: 3.1 g/dL — ABNORMAL LOW (ref 3.5–5.0)
Alkaline Phosphatase: 62 U/L (ref 38–126)
Anion gap: 9 (ref 5–15)
BUN: 16 mg/dL (ref 8–23)
CO2: 29 mmol/L (ref 22–32)
Calcium: 8.7 mg/dL — ABNORMAL LOW (ref 8.9–10.3)
Chloride: 103 mmol/L (ref 98–111)
Creatinine, Ser: 0.8 mg/dL (ref 0.44–1.00)
GFR, Estimated: >60 mL/min (ref >60)
Glucose, Bld: 109 mg/dL — ABNORMAL HIGH (ref 70–99)
Potassium: 4.5 mmol/L (ref 3.5–5.1)
Sodium: 141 mmol/L (ref 135–145)
Total Bilirubin: 0.9 mg/dL (ref 0.3–1.2)
Total Protein: 5.9 g/dL — ABNORMAL LOW (ref 6.5–8.1)

## 2020-03-01 MED ORDER — POLYETHYLENE GLYCOL 3350 17 G PO PACK
17.0000 g | PACK | Freq: Every day | ORAL | 0 refills | Status: DC | PRN
Start: 2020-03-01 — End: 2023-11-22

## 2020-03-01 MED ORDER — SENNOSIDES-DOCUSATE SODIUM 8.6-50 MG PO TABS: 1.0000 | ORAL_TABLET | Freq: Every day | ORAL | Status: DC

## 2020-03-01 MED ORDER — CEFDINIR 300 MG PO CAPS: 300.0000 mg | ORAL_CAPSULE | Freq: Two times a day (BID) | ORAL | 0 refills | Status: AC

## 2020-03-01 MED ORDER — OXYCODONE HCL 5 MG PO TABS: 5.0000 mg | ORAL_TABLET | Freq: Four times a day (QID) | ORAL | 0 refills | Status: DC | PRN

## 2020-03-01 MED ORDER — SENNOSIDES-DOCUSATE SODIUM 8.6-50 MG PO TABS
1.0000 | ORAL_TABLET | Freq: Every day | ORAL | Status: DC
Start: 2020-03-01 — End: 2020-04-09

## 2020-03-01 MED ORDER — POLYETHYLENE GLYCOL 3350 17 G PO PACK
17.0000 g | PACK | Freq: Every day | ORAL | Status: DC
  Administered 2020-03-01: 09:00:00 17 g via ORAL
  Filled 2020-03-01: qty 1

## 2020-03-01 NOTE — Discharge Summary (Signed)
Discharge Summary  Kirsten Snyder PIR:[id number] DOB: 14-Jun-1933  PCP: [name], MD  Admit date: 02/27/2020 Discharge date: 03/01/2020  Time spent: 40 mins  Recommendations for Outpatient Follow-up:  1. PCP in 1 week   Discharge Diagnoses:  Active Hospital Problems   Diagnosis Date Noted  . UTI (urinary tract infection) 02/27/2020  . Fall at home, initial encounter 02/27/2020  . Essential hypertension 12/13/2015  . Acid reflux 10/25/2014    Resolved Hospital Problems  No resolved problems to display.    Discharge Condition: Stable  Diet recommendation: Heart healthy  Vitals:   03/01/20 0758 03/01/20 1148  BP: 126/68 112/76  Pulse: 72 69  Resp: 18 20  Temp: 98.4 F (36.9 C) 98.5 F (36.9 C)  SpO2: 92% 96%    History of present illness:  Kirsten Rissler Murphyis a 86 y.o.femalewith medical history significant ofbreast cancer, depression, GERD, gout, hyperlipidemia, hypertension, history of radiation therapy and asthma who lives alone at home. Patient apparently sustained a mechanical fall a day PTA, has since been having significant pain in the lower back and right hip area. Patient unable to walk or function even with her walker. Came to the ER weak and debilitated. Evaluation ongoing but no obvious fracture from imaging. Patient found to have UTI (c/o burning urination) which may be a precipitating event. She has been admitted to the hospital for further evaluation. In the ED, temperature is 98.2 blood pressure 143/73 pulse 89 respiratory 14, oxygen sat 91% on room air. White count 7.2 hemoglobin 11.9 platelets 223. Chemistry showed calcium 8.7 glucose 180 the rest appeared to be within normal. Urinalysis shows cloudy urine was large leukocytes WBC more than 50 with rare bacteria. CT of the hip and lumbar spine showed no acute fracture or dislocation and moderate degenerative arthritis. MRI of the right hip showed no fracture, but showed torn right hamstring  tendons from the attachment site on the ischium. Patient is still unable to add weight to her feet.  Patient admitted for further management.    Today, patient reports right hip/posterior thigh pain controlled with pain meds, but worsens upon any movement.  Denies any chest pain, shortness of breath, abdominal pain, nausea/vomiting, fever/chills.    Hospital Course:  Principal Problem:   UTI (urinary tract infection) Active Problems:   Acid reflux   Essential hypertension   Fall at home, initial encounter   Torn right hamstring tendon s/p mechanical fall Reports right hip/posterior thigh pain MRI right hip showed no fracture, but showed torn right hamstring tendons from the attachment site of the ischium Discussed with Orthopedics Dr Roland Rack on 02/28/2020, non-surgical management, WBAT, ice pack, ambulate with walker Pain management PT/OT  Possible UTI Patient reports dysuria UA showed large leukocytes, greater than 50 WBC, rare bacteria UC with multiple species S/p Rocephin, switch to cefdinir to complete 5 days of antibiotics, last dose on 03/02/20  Hypertension Not on any medications at home BP stable   History of PE and DVT in 7/21 Continue Eliquis  S/p meningioma resection 08/2019 Followed by [facility]  GERD Continue PPI   Asthma Continue inhaler  Obesity Lifestyle modification advised         Malnutrition Type:      Malnutrition Characteristics:      Nutrition Interventions:      Estimated body mass index is 39.48 kg/m as calculated from the following:   Height as of this encounter: 5\' 4"  (1.626 m).   Weight as of this encounter: 104.3 kg.  Procedures:  None  Consultations:  Discussed with orthopedics as mentioned above  Discharge Exam: BP 112/76 (BP Location: Right Arm)   Pulse 69   Temp 98.5 F (36.9 C) (Oral)   Resp 20   Ht 5\' 4"  (1.626 m)   Wt 104.3 kg   SpO2 96%   BMI 39.48 kg/m   General: NAD Cardiovascular:  S1, S2 present Respiratory: CTA B Neurology: Strength equal in all extremities, no obvious focal neurologic deficits.  ROM limited by pain    Discharge Instructions You were cared for by a hospitalist during your hospital stay. If you have any questions about your discharge medications or the care you received while you were in the hospital after you are discharged, you can call the unit and asked to speak with the hospitalist on call if the hospitalist that took care of you is not available. Once you are discharged, your primary care physician will handle any further medical issues. Please note that NO REFILLS for any discharge medications will be authorized once you are discharged, as it is imperative that you return to your primary care physician (or establish a relationship with a primary care physician if you do not have one) for your aftercare needs so that they can reassess your need for medications and monitor your lab values.  Discharge Instructions    Diet - low sodium heart healthy   Complete by: As directed    Increase activity slowly   Complete by: As directed      Allergies as of 03/01/2020      Reactions   Sulfa Antibiotics       Medication List    STOP taking these medications   cephALEXin 500 MG capsule Commonly known as: KEFLEX   tobramycin-dexamethasone ophthalmic solution Commonly known as: TOBRADEX     TAKE these medications   albuterol 108 (90 Base) MCG/ACT inhaler Commonly known as: VENTOLIN HFA Inhale 2 puffs into the lungs every 6 (six) hours as needed for wheezing or shortness of breath.   Allergy Relief 10 MG tablet Generic drug: loratadine TAKE (1) TABLET BY MOUTH EVERY DAY What changed: See the new instructions.   allopurinol 100 MG tablet Commonly known as: ZYLOPRIM TAKE (1) TABLET BY MOUTH EVERY DAY   apixaban 5 MG Tabs tablet Commonly known as: Eliquis TAKE (1) TABLET BY MOUTH TWICE DAILY   calcium carbonate 500 MG chewable  tablet Commonly known as: TUMS - dosed in mg elemental calcium Chew 1 tablet by mouth every 8 (eight) hours as needed for indigestion.   cefdinir 300 MG capsule Commonly known as: OMNICEF Take 1 capsule (300 mg total) by mouth every 12 (twelve) hours for 2 doses.   cetirizine 10 MG tablet Commonly known as: ZYRTEC Take 10 mg by mouth daily. For allergies.   ferrous sulfate 325 (65 FE) MG tablet Take 325 mg by mouth daily with breakfast.   lovastatin 40 MG tablet Commonly known as: MEVACOR TAKE (1) TABLET BY MOUTH DAILY AT BEDTIME   meclizine 12.5 MG tablet Commonly known as: ANTIVERT Take 1 tablet (12.5 mg total) by mouth 3 (three) times daily as needed for dizziness.   nystatin cream Commonly known as: MYCOSTATIN Apply 1 application topically 2 (two) times daily.   omeprazole 20 MG capsule Commonly known as: PRILOSEC TAKE (1) CAPSULE BY MOUTH EVERY DAY   oxyCODONE 5 MG immediate release tablet Commonly known as: Oxy IR/ROXICODONE Take 1 tablet (5 mg total) by mouth every 6 (six) hours as  needed for moderate pain or severe pain.   polyethylene glycol 17 g packet Commonly known as: MIRALAX / GLYCOLAX Take 17 g by mouth daily as needed.   senna-docusate 8.6-50 MG tablet Commonly known as: Senokot-S Take 1 tablet by mouth at bedtime.   sertraline 50 MG tablet Commonly known as: ZOLOFT TAKE (1) TABLET BY MOUTH EVERY DAY   traZODone 50 MG tablet Commonly known as: DESYREL TAKE (1) TABLET BY MOUTH DAILY AT BEDTIME   Trelegy Ellipta 100-62.5-25 MCG/INH Aepb Generic drug: Fluticasone-Umeclidin-Vilant Inhale 1 puff into the lungs daily.      Allergies  Allergen Reactions  . Sulfa Antibiotics     Follow-up Information    [name], MD. Schedule an appointment as soon as possible for a visit in 1 week(s).   Specialty: Family Medicine Contact information: [address] [place] [address] [phone]                The results of  significant diagnostics from this hospitalization (including imaging, microbiology, ancillary and laboratory) are listed below for reference.    Significant Diagnostic Studies: DG Lumbar Spine 2-3 Views  Result Date: 02/27/2020 CLINICAL DATA:  Fall, right hip pain EXAM: LUMBAR SPINE - 2-3 VIEW COMPARISON:  08/01/2013 FINDINGS: Five rib-bearing segments of the lumbar spine with diminutive ribs noted at T12. Normal lumbar lordosis. No acute fracture or listhesis the lumbar spine. Vertebral body height has been preserved. There is intervertebral disc space narrowing and endplate remodeling throughout the lumbar spine, most severe at L3-4 and L4-5, in keeping with changes of mild to moderate degenerative disc disease. This appears similar to prior examination. The paraspinal soft tissues are unremarkable. IMPRESSION: No acute fracture or listhesis of the lumbar spine. Electronically Signed   By: Fidela Salisbury MD   On: 02/27/2020 19:11   CT Lumbar Spine Wo Contrast  Result Date: 02/27/2020 CLINICAL DATA:  Fall with low back pain EXAM: CT LUMBAR SPINE WITHOUT CONTRAST TECHNIQUE: Multidetector CT imaging of the lumbar spine was performed without intravenous contrast administration. Multiplanar CT image reconstructions were also generated. COMPARISON:  None. FINDINGS: Segmentation: 5 lumbar type vertebrae. Alignment: Normal. Vertebrae: No acute fracture or focal pathologic process. Paraspinal and other soft tissues: Calcific aortic atherosclerosis. Disc levels: Multilevel facet arthrosis. No visible spinal canal stenosis or neural impingement. IMPRESSION: 1. No acute fracture or static subluxation of the lumbar spine. Aortic Atherosclerosis (ICD10-I70.0). Electronically Signed   By: Ulyses Jarred M.D.   On: 02/27/2020 19:59   CT Hip Right Wo Contrast  Result Date: 02/27/2020 CLINICAL DATA:  Hip trauma, fracture suspected, right hip pain following fall EXAM: CT OF THE RIGHT HIP WITHOUT CONTRAST TECHNIQUE:  Multidetector CT imaging of the right hip was performed according to the standard protocol. Multiplanar CT image reconstructions were also generated. COMPARISON:  None. FINDINGS: Bones/Joint/Cartilage Normal alignment. No acute fracture or dislocation. Moderate right hip degenerative arthritis with asymmetric joint space narrowing and subchondral sclerosis within the femoral head. No lytic or blastic bone lesion. Ligaments Suboptimally assessed by CT. Muscles and Tendons Dystrophic calcification within the hamstring origins likely represents the sequela of remote inflammation or trauma. Visualized musculature is otherwise unremarkable. Soft tissues Vascular calcifications noted within the visualized iliac vasculature. Soft tissues are otherwise unremarkable. IMPRESSION: No acute fracture or dislocation. Moderate degenerative arthritis. Electronically Signed   By: Fidela Salisbury MD   On: 02/27/2020 20:16   MR HIP RIGHT WO CONTRAST  Result Date: 02/28/2020 CLINICAL DATA:  [place].  Right hip pain. Negative x-rays. EXAM: MR OF THE RIGHT HIP WITHOUT CONTRAST TECHNIQUE: Multiplanar, multisequence MR imaging was performed. No intravenous contrast was administered. COMPARISON:  CT scan, same date. FINDINGS: Examination limited due to significant motion artifact. Both hips are normally located. No hip fracture or AVN. Mild bilateral degenerative changes for age. No hip joint effusion. No periarticular fluid collections. The pubic symphysis and SI joints are intact. No pelvic fractures or bone lesions. The hamstring tendons are torn from their attachment site on the ischium. Associated significant surrounding fluid/edema and hemorrhage. No significant intrapelvic abnormalities are identified. IMPRESSION: 1. No acute hip or pelvic fracture or AVN. 2. Torn right hamstring tendons from their attachment site on the ischium. 3. Mild bilateral hip joint degenerative changes for age. Electronically Signed   By: Marijo Sanes M.D.    On: 02/28/2020 05:12   DG Hip Unilat W or Wo Pelvis 2-3 Views Right  Result Date: 02/27/2020 CLINICAL DATA:  Fall, right hip pain EXAM: DG HIP (WITH OR WITHOUT PELVIS) 2-3V RIGHT COMPARISON:  None. FINDINGS: Single view radiograph the pelvis and two view radiograph of the right hip demonstrates normal alignment. No fracture or dislocation. Mild right hip degenerative arthritis is present with joint space narrowing noted. Soft tissues are unremarkable. IMPRESSION: No acute fracture or dislocation. Electronically Signed   By: Fidela Salisbury MD   On: 02/27/2020 19:09    Microbiology: Recent Results (from the past 240 hour(s))  Urine culture     Status: Abnormal   Collection Time: 02/27/20 10:04 PM   Specimen: Urine, Random  Result Value Ref Range Status   Specimen Description   Final    URINE, RANDOM Performed at [facility], [address], [place], [place] [address]    Special Requests   Final    NONE Performed at [facility], [address] [place], [place] [address]    Culture MULTIPLE SPECIES PRESENT, SUGGEST RECOLLECTION (A)  Final   Report Status 02/29/2020 FINAL  Final  Resp Panel by RT PCR (RSV, Flu A&B, Covid) - Nasopharyngeal Swab     Status: None   Collection Time: 02/28/20  3:47 AM   Specimen: Nasopharyngeal Swab  Result Value Ref Range Status   SARS Coronavirus 2 by RT PCR NEGATIVE NEGATIVE Final    Comment: (NOTE) SARS-CoV-2 target nucleic acids are NOT DETECTED.  The SARS-CoV-2 RNA is generally detectable in upper respiratoy specimens during the acute phase of infection. The lowest concentration of SARS-CoV-2 viral copies this assay can detect is 131 copies/mL. A negative result does not preclude SARS-Cov-2 infection and should not be used as the sole basis for treatment or other patient management decisions. A negative result may occur with  improper specimen collection/handling, submission of specimen other than nasopharyngeal swab,  presence of viral mutation(s) within the areas targeted by this assay, and inadequate number of viral copies (<131 copies/mL). A negative result must be combined with clinical observations, patient history, and epidemiological information. The expected result is Negative.  Fact Sheet for Patients:  PinkCheek.be  Fact Sheet for Healthcare Providers:  GravelBags.it  This test is no t yet approved or cleared by the [place] FDA and  has been authorized for detection and/or diagnosis of SARS-CoV-2 by FDA under an Emergency Use Authorization (EUA). This EUA will remain  in effect (meaning this test can be used) for the duration of the COVID-19 declaration under Section 564(b)(1) of the Act, 21 U.S.C. section 360bbb-3(b)(1), unless the authorization is  terminated or revoked sooner.     Influenza A by PCR NEGATIVE NEGATIVE Final   Influenza B by PCR NEGATIVE NEGATIVE Final    Comment: (NOTE) The Xpert Xpress SARS-CoV-2/FLU/RSV assay is intended as an aid in  the diagnosis of influenza from Nasopharyngeal swab specimens and  should not be used as a sole basis for treatment. Nasal washings and  aspirates are unacceptable for Xpert Xpress SARS-CoV-2/FLU/RSV  testing.  Fact Sheet for Patients: PinkCheek.be  Fact Sheet for Healthcare Providers: GravelBags.it  This test is not yet approved or cleared by the [place] FDA and  has been authorized for detection and/or diagnosis of SARS-CoV-2 by  FDA under an Emergency Use Authorization (EUA). This EUA will remain  in effect (meaning this test can be used) for the duration of the  Covid-19 declaration under Section 564(b)(1) of the Act, 21  U.S.C. section 360bbb-3(b)(1), unless the authorization is  terminated or revoked.    Respiratory Syncytial Virus by PCR NEGATIVE NEGATIVE Final    Comment: (NOTE) Fact Sheet for  Patients: PinkCheek.be  Fact Sheet for Healthcare Providers: GravelBags.it  This test is not yet approved or cleared by the [place] FDA and  has been authorized for detection and/or diagnosis of SARS-CoV-2 by  FDA under an Emergency Use Authorization (EUA). This EUA will remain  in effect (meaning this test can be used) for the duration of the  COVID-19 declaration under Section 564(b)(1) of the Act, 21 U.S.C.  section 360bbb-3(b)(1), unless the authorization is terminated or  revoked. Performed at [facility], [address] [place], [place] [address]      Labs: Basic Metabolic Panel: Recent Labs  Lab 02/27/20 1825 02/28/20 0347 02/29/20 0507 03/01/20 0617  NA 137 137 136 141  K 4.1 3.9 3.9 4.5  CL 99 100 102 103  CO2 28 28 26 29   GLUCOSE 118* 116* 95 109*  BUN 17 15 18 16   CREATININE 0.74 0.73 0.89 0.80  CALCIUM 8.7* 8.5* 8.4* 8.7*   Liver Function Tests: Recent Labs  Lab 02/28/20 0347 03/01/20 0617  AST 16 14*  ALT 10 9  ALKPHOS 57 62  BILITOT 1.4* 0.9  PROT 5.9* 5.9*  ALBUMIN 3.4* 3.1*   No results for input(s): LIPASE, AMYLASE in the last 168 hours. No results for input(s): AMMONIA in the last 168 hours. CBC: Recent Labs  Lab 02/27/20 1825 02/28/20 0347 02/29/20 0507 03/01/20 0617  WBC 7.2 6.5 5.5 5.3  NEUTROABS 5.2  --  2.9 3.0  HGB 11.9* 11.3* 10.5* 11.0*  HCT 35.7* 33.6* 31.5* 33.0*  MCV 94.7 93.9 93.8 94.6  PLT 223 209 202 224   Cardiac Enzymes: No results for input(s): CKTOTAL, CKMB, CKMBINDEX, TROPONINI in the last 168 hours. BNP: BNP (last 3 results) Recent Labs    10/01/19 0604  BNP 26.2    ProBNP (last 3 results) No results for input(s): PROBNP in the last 8760 hours.  CBG: No results for input(s): GLUCAP in the last 168 hours.     Signed:  Alma Friendly, MD [facility] 03/01/2020, 12:48 PM

## 2020-03-01 NOTE — TOC Transition Note (Signed)
Transition of Care [facility]) - CM/SW Discharge Note   Patient Details  Name: [name] MRN: [id number] Date of Birth: 07-Oct-1933  [facility]) CM/SW Contact:  [name], LCSW Phone Number: 03/01/2020, 1:57 PM   Clinical Narrative:   CSW received update from Tammy with [facility] that they have insurance authorization. Per Dr. Horris Latino, patient is medically ready for discharge. Tammy reported patient would be in a semi private room, Room [id number]. CSW updated patient's daughter Hassan Rowan who is agreeable to this. Per Hassan Rowan, patient is vaccinated. Updated RN and asked to call report. Updated MD and asked for DC Summary. Medical Necessity Form and Face Sheet placed in Discharge Packet. [facility] arranged for 5:00 pick up.    Final next level of care: Skilled Nursing Facility Barriers to Discharge: Barriers Resolved   Patient Goals and CMS Choice Patient states their goals for this hospitalization and ongoing recovery are:: SNF rehab CMS Medicare.gov Compare Post Acute Care list provided to:: Patient Choice offered to / list presented to : Patient, Adult Children  Discharge Placement              Patient chooses bed at: [facility] Patient to be transferred to facility by: EMS Name of family member notified: Daughter Ennis Forts Patient and family notified of of transfer: 03/01/20  Discharge Plan and Services                                     Social Determinants of Health (SDOH) Interventions     Readmission Risk Interventions No flowsheet data found.

## 2020-03-01 NOTE — Progress Notes (Addendum)
A+Ox4.  VSS. Patient reported mild pain of 4/10 of 0-10 Pain Scale.  Tylenol administered per orders.  Patient transferred to [facility] with all belongings via [facility] with Lewayne Bunting and Gillis Santa.  Report called to Yaakov Guthrie, RN.  All questions answered.  All care transferred.

## 2020-03-01 NOTE — Care Management Important Message (Signed)
Important Message  Patient Details  Name: Kirsten Snyder MRN: [id number] Date of Birth: February 19, 1934   Medicare Important Message Given:  Yes     [name] Old 03/01/2020, 10:52 AM

## 2020-03-01 NOTE — Plan of Care (Signed)
  Problem: Education: Goal: Knowledge of General Education information will improve Description: Including pain rating scale, medication(s)/side effects and non-pharmacologic comfort measures Outcome: Adequate for Discharge   Problem: Health Behavior/Discharge Planning: Goal: Ability to manage health-related needs will improve Outcome: Adequate for Discharge   Problem: Clinical Measurements: Goal: Ability to maintain clinical measurements within normal limits will improve Outcome: Adequate for Discharge Goal: Will remain free from infection Outcome: Adequate for Discharge Goal: Diagnostic test results will improve Outcome: Adequate for Discharge Goal: Respiratory complications will improve Outcome: Adequate for Discharge Goal: Cardiovascular complication will be avoided Outcome: Adequate for Discharge   Problem: Activity: Goal: Risk for activity intolerance will decrease Outcome: Adequate for Discharge   Problem: Nutrition: Goal: Adequate nutrition will be maintained Outcome: Adequate for Discharge   Problem: Coping: Goal: Level of anxiety will decrease Outcome: Adequate for Discharge   Problem: Elimination: Goal: Will not experience complications related to bowel motility Outcome: Adequate for Discharge Goal: Will not experience complications related to urinary retention Outcome: Adequate for Discharge   Problem: Pain Managment: Goal: General experience of comfort will improve Outcome: Adequate for Discharge   Problem: Safety: Goal: Ability to remain free from injury will improve Outcome: Adequate for Discharge   Problem: Skin Integrity: Goal: Risk for impaired skin integrity will decrease Outcome: Adequate for Discharge   Problem: Urinary Elimination: Goal: Signs and symptoms of infection will decrease Outcome: Adequate for Discharge   

## 2020-03-04 ENCOUNTER — Ambulatory Visit: Payer: PRIVATE HEALTH INSURANCE | Admitting: Gastroenterology

## 2020-03-04 DIAGNOSIS — K219 Gastro-esophageal reflux disease without esophagitis: Secondary | ICD-10-CM | POA: Diagnosis not present

## 2020-03-04 DIAGNOSIS — M25561 Pain in right knee: Secondary | ICD-10-CM | POA: Diagnosis not present

## 2020-03-04 DIAGNOSIS — I4891 Unspecified atrial fibrillation: Secondary | ICD-10-CM | POA: Diagnosis not present

## 2020-03-04 DIAGNOSIS — S76301A Unspecified injury of muscle, fascia and tendon of the posterior muscle group at thigh level, right thigh, initial encounter: Secondary | ICD-10-CM | POA: Diagnosis not present

## 2020-03-04 DIAGNOSIS — M109 Gout, unspecified: Secondary | ICD-10-CM | POA: Diagnosis not present

## 2020-03-05 ENCOUNTER — Inpatient Hospital Stay: Payer: Medicare HMO | Admitting: Family Medicine

## 2020-03-06 DIAGNOSIS — I4891 Unspecified atrial fibrillation: Secondary | ICD-10-CM | POA: Diagnosis not present

## 2020-03-06 DIAGNOSIS — S76301A Unspecified injury of muscle, fascia and tendon of the posterior muscle group at thigh level, right thigh, initial encounter: Secondary | ICD-10-CM | POA: Diagnosis not present

## 2020-03-06 DIAGNOSIS — M25561 Pain in right knee: Secondary | ICD-10-CM | POA: Diagnosis not present

## 2020-03-06 DIAGNOSIS — M109 Gout, unspecified: Secondary | ICD-10-CM | POA: Diagnosis not present

## 2020-03-06 DIAGNOSIS — R52 Pain, unspecified: Secondary | ICD-10-CM | POA: Diagnosis not present

## 2020-03-06 DIAGNOSIS — K5900 Constipation, unspecified: Secondary | ICD-10-CM | POA: Diagnosis not present

## 2020-03-13 DIAGNOSIS — M109 Gout, unspecified: Secondary | ICD-10-CM | POA: Diagnosis not present

## 2020-03-13 DIAGNOSIS — I4891 Unspecified atrial fibrillation: Secondary | ICD-10-CM | POA: Diagnosis not present

## 2020-03-13 DIAGNOSIS — S76301A Unspecified injury of muscle, fascia and tendon of the posterior muscle group at thigh level, right thigh, initial encounter: Secondary | ICD-10-CM | POA: Diagnosis not present

## 2020-03-13 DIAGNOSIS — I10 Essential (primary) hypertension: Secondary | ICD-10-CM | POA: Diagnosis not present

## 2020-03-18 DIAGNOSIS — K649 Unspecified hemorrhoids: Secondary | ICD-10-CM | POA: Diagnosis not present

## 2020-03-18 DIAGNOSIS — K5900 Constipation, unspecified: Secondary | ICD-10-CM | POA: Diagnosis not present

## 2020-03-18 DIAGNOSIS — S76301A Unspecified injury of muscle, fascia and tendon of the posterior muscle group at thigh level, right thigh, initial encounter: Secondary | ICD-10-CM | POA: Diagnosis not present

## 2020-03-18 DIAGNOSIS — I4891 Unspecified atrial fibrillation: Secondary | ICD-10-CM | POA: Diagnosis not present

## 2020-03-22 DIAGNOSIS — J302 Other seasonal allergic rhinitis: Secondary | ICD-10-CM | POA: Diagnosis not present

## 2020-03-22 DIAGNOSIS — I10 Essential (primary) hypertension: Secondary | ICD-10-CM | POA: Diagnosis not present

## 2020-03-22 DIAGNOSIS — K5900 Constipation, unspecified: Secondary | ICD-10-CM | POA: Diagnosis not present

## 2020-03-22 DIAGNOSIS — I4891 Unspecified atrial fibrillation: Secondary | ICD-10-CM | POA: Diagnosis not present

## 2020-03-22 DIAGNOSIS — S76301A Unspecified injury of muscle, fascia and tendon of the posterior muscle group at thigh level, right thigh, initial encounter: Secondary | ICD-10-CM | POA: Diagnosis not present

## 2020-03-22 DIAGNOSIS — R21 Rash and other nonspecific skin eruption: Secondary | ICD-10-CM | POA: Diagnosis not present

## 2020-03-26 ENCOUNTER — Other Ambulatory Visit: Payer: Self-pay

## 2020-03-26 NOTE — Patient Outreach (Signed)
[facility]) Care Management  03/26/2020  Kirsten Snyder [birth year] [id number]   Referral Date: 03/26/20 Referral Source: [employer] Report Date of Discharge: 03/25/20 Facility:  [facility] Insurance: [facility]   Referral received.  No outreach warranted at this time.  Transition of Care calls being completed via EMMI. RN CM will outreach patient for any red flags received.    Plan: RN CM will close case.    Jone Baseman, RN, MSN [facility] Care Management Care Management Coordinator Direct Line [phone] Toll Free: [phone]  Fax: [phone]

## 2020-03-27 DIAGNOSIS — D509 Iron deficiency anemia, unspecified: Secondary | ICD-10-CM | POA: Diagnosis not present

## 2020-03-27 DIAGNOSIS — S76311D Strain of muscle, fascia and tendon of the posterior muscle group at thigh level, right thigh, subsequent encounter: Secondary | ICD-10-CM | POA: Diagnosis not present

## 2020-03-27 DIAGNOSIS — F32A Depression, unspecified: Secondary | ICD-10-CM | POA: Diagnosis not present

## 2020-03-27 DIAGNOSIS — I4891 Unspecified atrial fibrillation: Secondary | ICD-10-CM | POA: Diagnosis not present

## 2020-03-27 DIAGNOSIS — I10 Essential (primary) hypertension: Secondary | ICD-10-CM | POA: Diagnosis not present

## 2020-03-27 DIAGNOSIS — J45909 Unspecified asthma, uncomplicated: Secondary | ICD-10-CM | POA: Diagnosis not present

## 2020-03-27 DIAGNOSIS — F418 Other specified anxiety disorders: Secondary | ICD-10-CM | POA: Diagnosis not present

## 2020-03-27 DIAGNOSIS — M109 Gout, unspecified: Secondary | ICD-10-CM | POA: Diagnosis not present

## 2020-03-27 DIAGNOSIS — R21 Rash and other nonspecific skin eruption: Secondary | ICD-10-CM | POA: Diagnosis not present

## 2020-03-28 ENCOUNTER — Telehealth: Payer: Self-pay | Admitting: Family Medicine

## 2020-03-28 DIAGNOSIS — F418 Other specified anxiety disorders: Secondary | ICD-10-CM | POA: Diagnosis not present

## 2020-03-28 DIAGNOSIS — D509 Iron deficiency anemia, unspecified: Secondary | ICD-10-CM | POA: Diagnosis not present

## 2020-03-28 DIAGNOSIS — F32A Depression, unspecified: Secondary | ICD-10-CM | POA: Diagnosis not present

## 2020-03-28 DIAGNOSIS — R21 Rash and other nonspecific skin eruption: Secondary | ICD-10-CM | POA: Diagnosis not present

## 2020-03-28 DIAGNOSIS — J45909 Unspecified asthma, uncomplicated: Secondary | ICD-10-CM | POA: Diagnosis not present

## 2020-03-28 DIAGNOSIS — I10 Essential (primary) hypertension: Secondary | ICD-10-CM | POA: Diagnosis not present

## 2020-03-28 DIAGNOSIS — I4891 Unspecified atrial fibrillation: Secondary | ICD-10-CM | POA: Diagnosis not present

## 2020-03-28 DIAGNOSIS — S76311D Strain of muscle, fascia and tendon of the posterior muscle group at thigh level, right thigh, subsequent encounter: Secondary | ICD-10-CM | POA: Diagnosis not present

## 2020-03-28 DIAGNOSIS — M109 Gout, unspecified: Secondary | ICD-10-CM | POA: Diagnosis not present

## 2020-03-28 NOTE — Telephone Encounter (Signed)
Bill PT with [facility] is calling in to request verbal orders to continue working with pt.  Frequency:   1 week1 2 week1 1 week 1 2 week1 1 week 3  CB: [phone] okay to lvm

## 2020-03-28 NOTE — Telephone Encounter (Signed)
[employer] and gave the okay to continue

## 2020-03-29 DIAGNOSIS — R262 Difficulty in walking, not elsewhere classified: Secondary | ICD-10-CM | POA: Diagnosis not present

## 2020-03-29 DIAGNOSIS — M6281 Muscle weakness (generalized): Secondary | ICD-10-CM | POA: Diagnosis not present

## 2020-04-02 DIAGNOSIS — M109 Gout, unspecified: Secondary | ICD-10-CM | POA: Diagnosis not present

## 2020-04-02 DIAGNOSIS — R21 Rash and other nonspecific skin eruption: Secondary | ICD-10-CM | POA: Diagnosis not present

## 2020-04-02 DIAGNOSIS — F418 Other specified anxiety disorders: Secondary | ICD-10-CM | POA: Diagnosis not present

## 2020-04-02 DIAGNOSIS — I10 Essential (primary) hypertension: Secondary | ICD-10-CM | POA: Diagnosis not present

## 2020-04-02 DIAGNOSIS — I4891 Unspecified atrial fibrillation: Secondary | ICD-10-CM | POA: Diagnosis not present

## 2020-04-02 DIAGNOSIS — F32A Depression, unspecified: Secondary | ICD-10-CM | POA: Diagnosis not present

## 2020-04-02 DIAGNOSIS — D509 Iron deficiency anemia, unspecified: Secondary | ICD-10-CM | POA: Diagnosis not present

## 2020-04-02 DIAGNOSIS — J45909 Unspecified asthma, uncomplicated: Secondary | ICD-10-CM | POA: Diagnosis not present

## 2020-04-02 DIAGNOSIS — S76311D Strain of muscle, fascia and tendon of the posterior muscle group at thigh level, right thigh, subsequent encounter: Secondary | ICD-10-CM | POA: Diagnosis not present

## 2020-04-03 DIAGNOSIS — R21 Rash and other nonspecific skin eruption: Secondary | ICD-10-CM | POA: Diagnosis not present

## 2020-04-03 DIAGNOSIS — S76311D Strain of muscle, fascia and tendon of the posterior muscle group at thigh level, right thigh, subsequent encounter: Secondary | ICD-10-CM | POA: Diagnosis not present

## 2020-04-03 DIAGNOSIS — F418 Other specified anxiety disorders: Secondary | ICD-10-CM | POA: Diagnosis not present

## 2020-04-03 DIAGNOSIS — M109 Gout, unspecified: Secondary | ICD-10-CM | POA: Diagnosis not present

## 2020-04-03 DIAGNOSIS — J45909 Unspecified asthma, uncomplicated: Secondary | ICD-10-CM | POA: Diagnosis not present

## 2020-04-03 DIAGNOSIS — F32A Depression, unspecified: Secondary | ICD-10-CM | POA: Diagnosis not present

## 2020-04-03 DIAGNOSIS — D509 Iron deficiency anemia, unspecified: Secondary | ICD-10-CM | POA: Diagnosis not present

## 2020-04-03 DIAGNOSIS — I4891 Unspecified atrial fibrillation: Secondary | ICD-10-CM | POA: Diagnosis not present

## 2020-04-03 DIAGNOSIS — I10 Essential (primary) hypertension: Secondary | ICD-10-CM | POA: Diagnosis not present

## 2020-04-09 ENCOUNTER — Encounter: Payer: Self-pay | Admitting: Family Medicine

## 2020-04-09 ENCOUNTER — Ambulatory Visit (INDEPENDENT_AMBULATORY_CARE_PROVIDER_SITE_OTHER): Payer: Medicare HMO | Admitting: Family Medicine

## 2020-04-09 ENCOUNTER — Other Ambulatory Visit: Payer: Self-pay

## 2020-04-09 VITALS — BP 130/80 | HR 64 | Ht 63.0 in | Wt 223.0 lb

## 2020-04-09 DIAGNOSIS — N3000 Acute cystitis without hematuria: Secondary | ICD-10-CM

## 2020-04-09 DIAGNOSIS — R21 Rash and other nonspecific skin eruption: Secondary | ICD-10-CM | POA: Diagnosis not present

## 2020-04-09 DIAGNOSIS — J45909 Unspecified asthma, uncomplicated: Secondary | ICD-10-CM | POA: Diagnosis not present

## 2020-04-09 DIAGNOSIS — F32A Depression, unspecified: Secondary | ICD-10-CM | POA: Diagnosis not present

## 2020-04-09 DIAGNOSIS — B379 Candidiasis, unspecified: Secondary | ICD-10-CM | POA: Diagnosis not present

## 2020-04-09 DIAGNOSIS — D509 Iron deficiency anemia, unspecified: Secondary | ICD-10-CM | POA: Diagnosis not present

## 2020-04-09 DIAGNOSIS — R059 Cough, unspecified: Secondary | ICD-10-CM

## 2020-04-09 DIAGNOSIS — S76311D Strain of muscle, fascia and tendon of the posterior muscle group at thigh level, right thigh, subsequent encounter: Secondary | ICD-10-CM | POA: Diagnosis not present

## 2020-04-09 DIAGNOSIS — F418 Other specified anxiety disorders: Secondary | ICD-10-CM | POA: Diagnosis not present

## 2020-04-09 DIAGNOSIS — I4891 Unspecified atrial fibrillation: Secondary | ICD-10-CM | POA: Diagnosis not present

## 2020-04-09 DIAGNOSIS — M109 Gout, unspecified: Secondary | ICD-10-CM | POA: Diagnosis not present

## 2020-04-09 DIAGNOSIS — I10 Essential (primary) hypertension: Secondary | ICD-10-CM | POA: Diagnosis not present

## 2020-04-09 MED ORDER — MUPIROCIN 2 % EX OINT: 1.0000 "application " | TOPICAL_OINTMENT | Freq: Two times a day (BID) | CUTANEOUS | 3 refills | Status: DC

## 2020-04-09 MED ORDER — FLUCONAZOLE 150 MG PO TABS: 150.0000 mg | ORAL_TABLET | Freq: Once | ORAL | 0 refills | Status: AC

## 2020-04-09 MED ORDER — CEFUROXIME AXETIL 250 MG PO TABS: 250.0000 mg | ORAL_TABLET | Freq: Two times a day (BID) | ORAL | 0 refills | Status: DC

## 2020-04-09 MED ORDER — NYSTATIN 100000 UNIT/GM EX CREA: 1.0000 "application " | TOPICAL_CREAM | Freq: Two times a day (BID) | CUTANEOUS | 3 refills | Status: DC

## 2020-04-09 NOTE — Progress Notes (Signed)
Date:  04/09/2020   Name:  Kirsten Snyder   DOB:  1933/11/30   MRN:  [id number]   Chief Complaint: Urinary Tract Infection (Burning when going and frequent urination)  Urinary Tract Infection  This is a new problem. The current episode started in the past 7 days. The problem occurs intermittently. The quality of the pain is described as burning. The pain is mild. Associated symptoms include frequency and urgency. Pertinent negatives include no chills, discharge, flank pain, hematuria, hesitancy or nausea. The treatment provided mild relief.  Cough This is a new problem. The current episode started in the past 7 days. The problem has been gradually improving. The cough is non-productive. Associated symptoms include ear pain, nasal congestion and rhinorrhea. Pertinent negatives include no chest pain, chills, fever, headaches, myalgias, rash, sore throat, shortness of breath or wheezing. There is no history of environmental allergies.    Lab Results  Component Value Date   CREATININE 0.80 03/01/2020   BUN 16 03/01/2020   NA 141 03/01/2020   K 4.5 03/01/2020   CL 103 03/01/2020   CO2 29 03/01/2020   Lab Results  Component Value Date   CHOL 219 (H) 02/23/2020   HDL 41 02/23/2020   LDLCALC 141 (H) 02/23/2020   TRIG 207 (H) 02/23/2020   CHOLHDL 4.8 (H) 11/02/2017   Lab Results  Component Value Date   TSH 2.39 09/15/2012   No results found for: HGBA1C Lab Results  Component Value Date   WBC 5.3 03/01/2020   HGB 11.0 (L) 03/01/2020   HCT 33.0 (L) 03/01/2020   MCV 94.6 03/01/2020   PLT 224 03/01/2020   Lab Results  Component Value Date   ALT 9 03/01/2020   AST 14 (L) 03/01/2020   ALKPHOS 62 03/01/2020   BILITOT 0.9 03/01/2020     Review of Systems  Constitutional: Negative for chills and fever.  HENT: Positive for ear pain and rhinorrhea. Negative for drooling, ear discharge and sore throat.   Respiratory: Positive for cough. Negative for shortness of breath and  wheezing.   Cardiovascular: Negative for chest pain, palpitations and leg swelling.  Gastrointestinal: Negative for abdominal pain, blood in stool, constipation, diarrhea and nausea.  Endocrine: Negative for polydipsia.  Genitourinary: Positive for frequency and urgency. Negative for dysuria, flank pain, hematuria and hesitancy.  Musculoskeletal: Negative for back pain, myalgias and neck pain.  Skin: Negative for rash.  Allergic/Immunologic: Negative for environmental allergies.  Neurological: Negative for dizziness and headaches.  Hematological: Does not bruise/bleed easily.  Psychiatric/Behavioral: Negative for suicidal ideas. The patient is not nervous/anxious.     Patient Active Problem List   Diagnosis Date Noted  . UTI (urinary tract infection) 02/27/2020  . Fall at home, initial encounter 02/27/2020  . Sepsis secondary to UTI ([place]) 10/01/2019  . Sepsis ([place]) 10/01/2019  . Hypokalemia 10/01/2019  . Atherosclerosis of abdominal aorta ([place]) 10/12/2018  . SOBOE (shortness of breath on exertion) 10/12/2018  . Osteopenia of neck of right femur 06/29/2018  . Breast cancer, right ([place]) 06/17/2016  . Essential hypertension 12/13/2015  . Breast abscess of female 11/07/2015  . Familial multiple lipoprotein-type hyperlipidemia 10/25/2014  . Clinical depression 10/25/2014  . Acid reflux 10/25/2014  . Acute gastrointestinal bleeding 10/25/2014  . Aggrieved 10/25/2014  . Airway hyperreactivity 10/25/2014  . Arthritis of knee 10/25/2014  . H/O: gout 10/25/2014  . Adiposity 10/25/2014  . Arthritis, degenerative 10/25/2014  . H/O adenomatous polyp of colon 03/07/2014    Allergies  Allergen Reactions  . Sulfa Antibiotics     Past Surgical History:  Procedure Laterality Date  . BREAST BIOPSY Bilateral    negative  . BREAST EXCISIONAL BIOPSY Right    right positive 04/2010  . BREAST LUMPECTOMY Right    removed a cancerous lump  . VAGINAL HYSTERECTOMY      Social History    Tobacco Use  . Smoking status: Former Smoker    Packs/day: 0.25    Years: 2.00    Pack years: 0.50    Types: Cigarettes  . Smokeless tobacco: Never Used  . Tobacco comment: smoking cessation materials not required  Vaping Use  . Vaping Use: Never used  Substance Use Topics  . Alcohol use: No    Alcohol/week: 0.0 standard drinks  . Drug use: No     Medication list has been reviewed and updated.  Current Meds  Medication Sig  . albuterol (VENTOLIN HFA) 108 (90 Base) MCG/ACT inhaler Inhale 2 puffs into the lungs every 6 (six) hours as needed for wheezing or shortness of breath.  . ALLERGY RELIEF 10 MG tablet TAKE (1) TABLET BY MOUTH EVERY DAY (Patient taking differently: Take 10 mg by mouth daily.)  . allopurinol (ZYLOPRIM) 100 MG tablet TAKE (1) TABLET BY MOUTH EVERY DAY  . apixaban (ELIQUIS) 5 MG TABS tablet TAKE (1) TABLET BY MOUTH TWICE DAILY  . calcium carbonate (TUMS - DOSED IN MG ELEMENTAL CALCIUM) 500 MG chewable tablet Chew 1 tablet by mouth every 8 (eight) hours as needed for indigestion.  . cetirizine (ZYRTEC) 10 MG tablet Take 10 mg by mouth daily. For allergies.  . ferrous sulfate 325 (65 FE) MG tablet Take 325 mg by mouth daily with breakfast.  . Fluticasone-Umeclidin-Vilant (TRELEGY ELLIPTA) 100-62.5-25 MCG/INH AEPB Inhale 1 puff into the lungs daily.  Marland Kitchen lovastatin (MEVACOR) 40 MG tablet TAKE (1) TABLET BY MOUTH DAILY AT BEDTIME  . meclizine (ANTIVERT) 12.5 MG tablet Take 1 tablet (12.5 mg total) by mouth 3 (three) times daily as needed for dizziness.  . nystatin cream (MYCOSTATIN) Apply 1 application topically 2 (two) times daily.  Marland Kitchen omeprazole (PRILOSEC) 20 MG capsule TAKE (1) CAPSULE BY MOUTH EVERY DAY  . polyethylene glycol (MIRALAX / GLYCOLAX) 17 g packet Take 17 g by mouth daily as needed.  . sertraline (ZOLOFT) 50 MG tablet TAKE (1) TABLET BY MOUTH EVERY DAY  . traZODone (DESYREL) 50 MG tablet TAKE (1) TABLET BY MOUTH DAILY AT BEDTIME    PHQ 2/9 Scores  02/23/2020 12/21/2019 11/14/2019 09/14/2019  PHQ - 2 Score 2 2 0 0  PHQ- 9 Score 9 5 0 2    GAD 7 : Generalized Anxiety Score 02/23/2020 12/21/2019 11/14/2019 09/14/2019  Nervous, Anxious, on Edge 0 3 0 1  Control/stop worrying 0 3 0 1  Worry too much - different things 0 3 0 1  Trouble relaxing 0 1 0 1  Restless 0 0 0 0  Easily annoyed or irritable 0 0 0 0  Afraid - awful might happen 0 0 0 0  Total GAD 7 Score 0 10 0 4  Anxiety Difficulty Not difficult at all Not difficult at all Not difficult at all Not difficult at all    BP Readings from Last 3 Encounters:  04/09/20 130/80  03/01/20 133/65  02/23/20 120/70    Physical Exam Vitals and nursing note reviewed.  Constitutional:      Appearance: She is well-developed and well-nourished.  HENT:     Head: Normocephalic.  Right Ear: Tympanic membrane, ear canal and external ear normal.     Left Ear: Tympanic membrane, ear canal and external ear normal.     Nose: Nose normal. No congestion or rhinorrhea.     Mouth/Throat:     Mouth: Oropharynx is clear and moist.  Eyes:     General: Lids are everted, no foreign bodies appreciated. No scleral icterus.       Left eye: No foreign body or hordeolum.     Extraocular Movements: EOM normal.     Conjunctiva/sclera: Conjunctivae normal.     Right eye: Right conjunctiva is not injected.     Left eye: Left conjunctiva is not injected.     Pupils: Pupils are equal, round, and reactive to light.  Neck:     Thyroid: No thyromegaly.     Vascular: No JVD.     Trachea: No tracheal deviation.  Cardiovascular:     Rate and Rhythm: Normal rate and regular rhythm.     Pulses: Intact distal pulses.     Heart sounds: Normal heart sounds, S1 normal and S2 normal. No murmur heard.  No systolic murmur is present.  No diastolic murmur is present. No friction rub. No gallop. No S3 or S4 sounds.   Pulmonary:     Effort: Pulmonary effort is normal. No respiratory distress.     Breath sounds: Normal  breath sounds. No decreased breath sounds, wheezing, rhonchi or rales.  Abdominal:     General: Bowel sounds are normal.     Palpations: Abdomen is soft. There is no hepatosplenomegaly or mass.     Tenderness: There is no abdominal tenderness. There is no guarding or rebound.  Musculoskeletal:        General: No tenderness. Normal range of motion.     Cervical back: Normal range of motion and neck supple.     Right lower leg: 1+ Pitting Edema present.     Left lower leg: 1+ Pitting Edema present.  Lymphadenopathy:     Cervical: No cervical adenopathy.  Skin:    General: Skin is warm.     Findings: No rash.  Neurological:     Mental Status: She is alert and oriented to person, place, and time.     Cranial Nerves: No cranial nerve deficit.     Deep Tendon Reflexes: Strength normal. Reflexes normal.  Psychiatric:        Mood and Affect: Mood and affect normal. Mood is not anxious or depressed.     Wt Readings from Last 3 Encounters:  04/09/20 223 lb (101.2 kg)  02/27/20 230 lb (104.3 kg)  02/23/20 213 lb (96.6 kg)    BP 130/80   Pulse 64   Ht 5\' 3"  (1.6 m)   Wt 223 lb (101.2 kg)   BMI 39.50 kg/m   Assessment and Plan: 1. Cough New onset.  Persistent.  Stable.  Patient's had a postnasal drainage is causing a cough.  We have suggested that she take Mucinex DM 1 teaspoon every 6 hours as needed cough.  2. Acute cystitis without hematuria Acute.  Persistent.  Patient has no CVA tenderness and minimal suprapubic tenderness.  Patient's been unable to get a urine for [place] to check so given the long weekend given the holidays we will start on Ceftin 250 mg 1 tablet twice a day. - cefUROXime (CEFTIN) 250 MG tablet; Take 1 tablet (250 mg total) by mouth 2 (two) times daily with a meal.  Dispense: 20 tablet; Refill:  0  3. Candidiasis Patient has history of candidiasis post antibiotic.  She is also having some yeast infection in the folds suggesting candidiasis.  Care is been discussed  with patient and we will treat with nystatin cream and Bactroban mixed together and apply twice a day to areas of concerns. - nystatin cream (MYCOSTATIN); Apply 1 application topically 2 (two) times daily.  Dispense: 60 g; Refill: 3 - mupirocin ointment (BACTROBAN) 2 %; Apply 1 application topically 2 (two) times daily.  Dispense: 44 g; Refill: 3

## 2020-04-12 DIAGNOSIS — M109 Gout, unspecified: Secondary | ICD-10-CM | POA: Diagnosis not present

## 2020-04-12 DIAGNOSIS — I4891 Unspecified atrial fibrillation: Secondary | ICD-10-CM | POA: Diagnosis not present

## 2020-04-12 DIAGNOSIS — R21 Rash and other nonspecific skin eruption: Secondary | ICD-10-CM | POA: Diagnosis not present

## 2020-04-12 DIAGNOSIS — J45909 Unspecified asthma, uncomplicated: Secondary | ICD-10-CM | POA: Diagnosis not present

## 2020-04-12 DIAGNOSIS — F32A Depression, unspecified: Secondary | ICD-10-CM | POA: Diagnosis not present

## 2020-04-12 DIAGNOSIS — F418 Other specified anxiety disorders: Secondary | ICD-10-CM | POA: Diagnosis not present

## 2020-04-12 DIAGNOSIS — D509 Iron deficiency anemia, unspecified: Secondary | ICD-10-CM | POA: Diagnosis not present

## 2020-04-12 DIAGNOSIS — I10 Essential (primary) hypertension: Secondary | ICD-10-CM | POA: Diagnosis not present

## 2020-04-12 DIAGNOSIS — S76311D Strain of muscle, fascia and tendon of the posterior muscle group at thigh level, right thigh, subsequent encounter: Secondary | ICD-10-CM | POA: Diagnosis not present

## 2020-04-16 DIAGNOSIS — S76311D Strain of muscle, fascia and tendon of the posterior muscle group at thigh level, right thigh, subsequent encounter: Secondary | ICD-10-CM | POA: Diagnosis not present

## 2020-04-16 DIAGNOSIS — F418 Other specified anxiety disorders: Secondary | ICD-10-CM | POA: Diagnosis not present

## 2020-04-16 DIAGNOSIS — I10 Essential (primary) hypertension: Secondary | ICD-10-CM | POA: Diagnosis not present

## 2020-04-16 DIAGNOSIS — I4891 Unspecified atrial fibrillation: Secondary | ICD-10-CM | POA: Diagnosis not present

## 2020-04-16 DIAGNOSIS — D509 Iron deficiency anemia, unspecified: Secondary | ICD-10-CM | POA: Diagnosis not present

## 2020-04-16 DIAGNOSIS — F32A Depression, unspecified: Secondary | ICD-10-CM | POA: Diagnosis not present

## 2020-04-16 DIAGNOSIS — J45909 Unspecified asthma, uncomplicated: Secondary | ICD-10-CM | POA: Diagnosis not present

## 2020-04-16 DIAGNOSIS — R21 Rash and other nonspecific skin eruption: Secondary | ICD-10-CM | POA: Diagnosis not present

## 2020-04-16 DIAGNOSIS — M109 Gout, unspecified: Secondary | ICD-10-CM | POA: Diagnosis not present

## 2020-04-18 DIAGNOSIS — J45909 Unspecified asthma, uncomplicated: Secondary | ICD-10-CM | POA: Diagnosis not present

## 2020-04-18 DIAGNOSIS — M109 Gout, unspecified: Secondary | ICD-10-CM | POA: Diagnosis not present

## 2020-04-18 DIAGNOSIS — R21 Rash and other nonspecific skin eruption: Secondary | ICD-10-CM | POA: Diagnosis not present

## 2020-04-18 DIAGNOSIS — S76311D Strain of muscle, fascia and tendon of the posterior muscle group at thigh level, right thigh, subsequent encounter: Secondary | ICD-10-CM | POA: Diagnosis not present

## 2020-04-18 DIAGNOSIS — F32A Depression, unspecified: Secondary | ICD-10-CM | POA: Diagnosis not present

## 2020-04-18 DIAGNOSIS — D509 Iron deficiency anemia, unspecified: Secondary | ICD-10-CM | POA: Diagnosis not present

## 2020-04-18 DIAGNOSIS — I4891 Unspecified atrial fibrillation: Secondary | ICD-10-CM | POA: Diagnosis not present

## 2020-04-18 DIAGNOSIS — F418 Other specified anxiety disorders: Secondary | ICD-10-CM | POA: Diagnosis not present

## 2020-04-18 DIAGNOSIS — I10 Essential (primary) hypertension: Secondary | ICD-10-CM | POA: Diagnosis not present

## 2020-04-23 ENCOUNTER — Other Ambulatory Visit: Payer: Self-pay | Admitting: Family Medicine

## 2020-04-23 DIAGNOSIS — K219 Gastro-esophageal reflux disease without esophagitis: Secondary | ICD-10-CM

## 2020-04-29 DIAGNOSIS — M6281 Muscle weakness (generalized): Secondary | ICD-10-CM | POA: Diagnosis not present

## 2020-04-29 DIAGNOSIS — R262 Difficulty in walking, not elsewhere classified: Secondary | ICD-10-CM | POA: Diagnosis not present

## 2020-05-01 DIAGNOSIS — J45909 Unspecified asthma, uncomplicated: Secondary | ICD-10-CM | POA: Diagnosis not present

## 2020-05-01 DIAGNOSIS — F32A Depression, unspecified: Secondary | ICD-10-CM | POA: Diagnosis not present

## 2020-05-01 DIAGNOSIS — S76311D Strain of muscle, fascia and tendon of the posterior muscle group at thigh level, right thigh, subsequent encounter: Secondary | ICD-10-CM | POA: Diagnosis not present

## 2020-05-01 DIAGNOSIS — D509 Iron deficiency anemia, unspecified: Secondary | ICD-10-CM | POA: Diagnosis not present

## 2020-05-01 DIAGNOSIS — I4891 Unspecified atrial fibrillation: Secondary | ICD-10-CM | POA: Diagnosis not present

## 2020-05-01 DIAGNOSIS — R21 Rash and other nonspecific skin eruption: Secondary | ICD-10-CM | POA: Diagnosis not present

## 2020-05-01 DIAGNOSIS — I10 Essential (primary) hypertension: Secondary | ICD-10-CM | POA: Diagnosis not present

## 2020-05-01 DIAGNOSIS — F418 Other specified anxiety disorders: Secondary | ICD-10-CM | POA: Diagnosis not present

## 2020-05-01 DIAGNOSIS — M109 Gout, unspecified: Secondary | ICD-10-CM | POA: Diagnosis not present

## 2020-05-03 DIAGNOSIS — R21 Rash and other nonspecific skin eruption: Secondary | ICD-10-CM | POA: Diagnosis not present

## 2020-05-03 DIAGNOSIS — J45909 Unspecified asthma, uncomplicated: Secondary | ICD-10-CM | POA: Diagnosis not present

## 2020-05-03 DIAGNOSIS — I4891 Unspecified atrial fibrillation: Secondary | ICD-10-CM | POA: Diagnosis not present

## 2020-05-03 DIAGNOSIS — F418 Other specified anxiety disorders: Secondary | ICD-10-CM | POA: Diagnosis not present

## 2020-05-03 DIAGNOSIS — S76311D Strain of muscle, fascia and tendon of the posterior muscle group at thigh level, right thigh, subsequent encounter: Secondary | ICD-10-CM | POA: Diagnosis not present

## 2020-05-03 DIAGNOSIS — M109 Gout, unspecified: Secondary | ICD-10-CM | POA: Diagnosis not present

## 2020-05-03 DIAGNOSIS — F32A Depression, unspecified: Secondary | ICD-10-CM | POA: Diagnosis not present

## 2020-05-03 DIAGNOSIS — D509 Iron deficiency anemia, unspecified: Secondary | ICD-10-CM | POA: Diagnosis not present

## 2020-05-03 DIAGNOSIS — I10 Essential (primary) hypertension: Secondary | ICD-10-CM | POA: Diagnosis not present

## 2020-05-08 DIAGNOSIS — M109 Gout, unspecified: Secondary | ICD-10-CM | POA: Diagnosis not present

## 2020-05-08 DIAGNOSIS — I4891 Unspecified atrial fibrillation: Secondary | ICD-10-CM | POA: Diagnosis not present

## 2020-05-08 DIAGNOSIS — F32A Depression, unspecified: Secondary | ICD-10-CM | POA: Diagnosis not present

## 2020-05-08 DIAGNOSIS — S76311D Strain of muscle, fascia and tendon of the posterior muscle group at thigh level, right thigh, subsequent encounter: Secondary | ICD-10-CM | POA: Diagnosis not present

## 2020-05-08 DIAGNOSIS — R21 Rash and other nonspecific skin eruption: Secondary | ICD-10-CM | POA: Diagnosis not present

## 2020-05-08 DIAGNOSIS — F418 Other specified anxiety disorders: Secondary | ICD-10-CM | POA: Diagnosis not present

## 2020-05-08 DIAGNOSIS — D509 Iron deficiency anemia, unspecified: Secondary | ICD-10-CM | POA: Diagnosis not present

## 2020-05-08 DIAGNOSIS — J45909 Unspecified asthma, uncomplicated: Secondary | ICD-10-CM | POA: Diagnosis not present

## 2020-05-08 DIAGNOSIS — I10 Essential (primary) hypertension: Secondary | ICD-10-CM | POA: Diagnosis not present

## 2020-05-09 ENCOUNTER — Telehealth: Payer: Self-pay | Admitting: Family Medicine

## 2020-05-09 NOTE — Telephone Encounter (Signed)
Called and left message on Kirsten Snyder's phone to give the "okay" for PT

## 2020-05-09 NOTE — Telephone Encounter (Signed)
Called to request verbal orders for PT - 1wk 2.  Any questions, please call [phone]

## 2020-05-13 ENCOUNTER — Ambulatory Visit: Payer: Medicare HMO

## 2020-05-14 DIAGNOSIS — F32A Depression, unspecified: Secondary | ICD-10-CM | POA: Diagnosis not present

## 2020-05-14 DIAGNOSIS — I4891 Unspecified atrial fibrillation: Secondary | ICD-10-CM | POA: Diagnosis not present

## 2020-05-14 DIAGNOSIS — D509 Iron deficiency anemia, unspecified: Secondary | ICD-10-CM | POA: Diagnosis not present

## 2020-05-14 DIAGNOSIS — F418 Other specified anxiety disorders: Secondary | ICD-10-CM | POA: Diagnosis not present

## 2020-05-14 DIAGNOSIS — S76311D Strain of muscle, fascia and tendon of the posterior muscle group at thigh level, right thigh, subsequent encounter: Secondary | ICD-10-CM | POA: Diagnosis not present

## 2020-05-14 DIAGNOSIS — R21 Rash and other nonspecific skin eruption: Secondary | ICD-10-CM | POA: Diagnosis not present

## 2020-05-14 DIAGNOSIS — M109 Gout, unspecified: Secondary | ICD-10-CM | POA: Diagnosis not present

## 2020-05-14 DIAGNOSIS — J45909 Unspecified asthma, uncomplicated: Secondary | ICD-10-CM | POA: Diagnosis not present

## 2020-05-14 DIAGNOSIS — I10 Essential (primary) hypertension: Secondary | ICD-10-CM | POA: Diagnosis not present

## 2020-05-15 ENCOUNTER — Ambulatory Visit: Payer: Medicare HMO

## 2020-05-15 DIAGNOSIS — I10 Essential (primary) hypertension: Secondary | ICD-10-CM | POA: Diagnosis not present

## 2020-05-15 DIAGNOSIS — F418 Other specified anxiety disorders: Secondary | ICD-10-CM | POA: Diagnosis not present

## 2020-05-15 DIAGNOSIS — I4891 Unspecified atrial fibrillation: Secondary | ICD-10-CM | POA: Diagnosis not present

## 2020-05-15 DIAGNOSIS — M109 Gout, unspecified: Secondary | ICD-10-CM | POA: Diagnosis not present

## 2020-05-15 DIAGNOSIS — D509 Iron deficiency anemia, unspecified: Secondary | ICD-10-CM | POA: Diagnosis not present

## 2020-05-15 DIAGNOSIS — S76311D Strain of muscle, fascia and tendon of the posterior muscle group at thigh level, right thigh, subsequent encounter: Secondary | ICD-10-CM | POA: Diagnosis not present

## 2020-05-15 DIAGNOSIS — R21 Rash and other nonspecific skin eruption: Secondary | ICD-10-CM | POA: Diagnosis not present

## 2020-05-15 DIAGNOSIS — F32A Depression, unspecified: Secondary | ICD-10-CM | POA: Diagnosis not present

## 2020-05-15 DIAGNOSIS — J45909 Unspecified asthma, uncomplicated: Secondary | ICD-10-CM | POA: Diagnosis not present

## 2020-05-16 DIAGNOSIS — I4891 Unspecified atrial fibrillation: Secondary | ICD-10-CM | POA: Diagnosis not present

## 2020-05-16 DIAGNOSIS — M109 Gout, unspecified: Secondary | ICD-10-CM | POA: Diagnosis not present

## 2020-05-16 DIAGNOSIS — I10 Essential (primary) hypertension: Secondary | ICD-10-CM | POA: Diagnosis not present

## 2020-05-16 DIAGNOSIS — R21 Rash and other nonspecific skin eruption: Secondary | ICD-10-CM | POA: Diagnosis not present

## 2020-05-16 DIAGNOSIS — J45909 Unspecified asthma, uncomplicated: Secondary | ICD-10-CM | POA: Diagnosis not present

## 2020-05-16 DIAGNOSIS — F418 Other specified anxiety disorders: Secondary | ICD-10-CM | POA: Diagnosis not present

## 2020-05-16 DIAGNOSIS — S76311D Strain of muscle, fascia and tendon of the posterior muscle group at thigh level, right thigh, subsequent encounter: Secondary | ICD-10-CM | POA: Diagnosis not present

## 2020-05-16 DIAGNOSIS — F32A Depression, unspecified: Secondary | ICD-10-CM | POA: Diagnosis not present

## 2020-05-16 DIAGNOSIS — D509 Iron deficiency anemia, unspecified: Secondary | ICD-10-CM | POA: Diagnosis not present

## 2020-05-17 ENCOUNTER — Telehealth: Payer: Self-pay

## 2020-05-17 NOTE — Telephone Encounter (Signed)
Called and faxed back

## 2020-05-17 NOTE — Telephone Encounter (Signed)
Copied from [place] [id number]. Topic: General - Other >> May 17, 2020  2:43 PM Yvette Rack wrote: Reason for CRM: Levada Dy with [facility] stated the provider signed the documents for Order# [id number] but there is no date. Levada Dy requests that the order be dated and sent back. Cb# [phone] Ext. [phone]

## 2020-05-20 ENCOUNTER — Ambulatory Visit (INDEPENDENT_AMBULATORY_CARE_PROVIDER_SITE_OTHER): Payer: Medicare HMO

## 2020-05-20 DIAGNOSIS — F32A Depression, unspecified: Secondary | ICD-10-CM | POA: Diagnosis not present

## 2020-05-20 DIAGNOSIS — J45909 Unspecified asthma, uncomplicated: Secondary | ICD-10-CM | POA: Diagnosis not present

## 2020-05-20 DIAGNOSIS — M109 Gout, unspecified: Secondary | ICD-10-CM | POA: Diagnosis not present

## 2020-05-20 DIAGNOSIS — F418 Other specified anxiety disorders: Secondary | ICD-10-CM | POA: Diagnosis not present

## 2020-05-20 DIAGNOSIS — R21 Rash and other nonspecific skin eruption: Secondary | ICD-10-CM | POA: Diagnosis not present

## 2020-05-20 DIAGNOSIS — Z0000 Encounter for general adult medical examination without abnormal findings: Secondary | ICD-10-CM | POA: Diagnosis not present

## 2020-05-20 DIAGNOSIS — S76311D Strain of muscle, fascia and tendon of the posterior muscle group at thigh level, right thigh, subsequent encounter: Secondary | ICD-10-CM | POA: Diagnosis not present

## 2020-05-20 DIAGNOSIS — I10 Essential (primary) hypertension: Secondary | ICD-10-CM | POA: Diagnosis not present

## 2020-05-20 DIAGNOSIS — D509 Iron deficiency anemia, unspecified: Secondary | ICD-10-CM | POA: Diagnosis not present

## 2020-05-20 DIAGNOSIS — I4891 Unspecified atrial fibrillation: Secondary | ICD-10-CM | POA: Diagnosis not present

## 2020-05-20 NOTE — Progress Notes (Signed)
Subjective:   Kirsten Snyder is a 87 y.o. female who presents for Medicare Annual (Subsequent) preventive examination.  Virtual Visit via Telephone Note  I connected with  Renford Dills on 05/20/20 at  2:40 PM EST by telephone and verified that I am speaking with the correct person using two identifiers.  Location: Patient: home Provider: [facility] Persons participating in the virtual visit: [place]   I discussed the limitations, risks, security and privacy concerns of performing an evaluation and management service by telephone and the availability of in person appointments. The patient expressed understanding and agreed to proceed.  Interactive audio and video telecommunications were attempted between this nurse and patient, however failed, due to patient having technical difficulties OR patient did not have access to video capability.  We continued and completed visit with audio only.  Some vital signs may be absent or patient reported.   Clemetine Marker, LPN    Review of Systems     Cardiac Risk Factors include: advanced age (>56men, >39 women);dyslipidemia;hypertension;obesity (BMI >30kg/m2);sedentary lifestyle     Objective:    There were no vitals filed for this visit. There is no height or weight on file to calculate BMI.  Advanced Directives 05/20/2020 02/28/2020 10/01/2019 05/10/2019 08/03/2018 12/13/2017 06/18/2017  Does Patient Have a Medical Advance Directive? Yes Yes Yes No No No No  Type of Paramedic of [place];Living will Healthcare Power of [place]  Does patient want to make changes to medical advance directive? - No - Patient declined No - Patient declined - - - -  Copy of [place] in Chart? No - copy requested No - copy requested No - copy requested - - - -  Would patient like information on creating a medical advance directive? - - - No - Patient declined No -  Patient declined Yes (MAU/Ambulatory/Procedural Areas - Information given) No - Patient declined    Current Medications (verified) Outpatient Encounter Medications as of 05/20/2020  Medication Sig  . albuterol (VENTOLIN HFA) 108 (90 Base) MCG/ACT inhaler Inhale 2 puffs into the lungs every 6 (six) hours as needed for wheezing or shortness of breath.  . allopurinol (ZYLOPRIM) 100 MG tablet TAKE (1) TABLET BY MOUTH EVERY DAY  . apixaban (ELIQUIS) 5 MG TABS tablet TAKE (1) TABLET BY MOUTH TWICE DAILY  . calcium carbonate (TUMS - DOSED IN MG ELEMENTAL CALCIUM) 500 MG chewable tablet Chew 1 tablet by mouth every 8 (eight) hours as needed for indigestion.  . cetirizine (ZYRTEC) 10 MG tablet Take 10 mg by mouth daily. For allergies.  . ferrous sulfate 325 (65 FE) MG tablet Take 325 mg by mouth daily with breakfast.  . Fluticasone-Umeclidin-Vilant (TRELEGY ELLIPTA) 100-62.5-25 MCG/INH AEPB Inhale 1 puff into the lungs daily.  Marland Kitchen lovastatin (MEVACOR) 40 MG tablet TAKE (1) TABLET BY MOUTH DAILY AT BEDTIME  . meclizine (ANTIVERT) 12.5 MG tablet Take 1 tablet (12.5 mg total) by mouth 3 (three) times daily as needed for dizziness.  . mupirocin ointment (BACTROBAN) 2 % Apply 1 application topically 2 (two) times daily.  Marland Kitchen nystatin cream (MYCOSTATIN) Apply 1 application topically 2 (two) times daily.  Marland Kitchen omeprazole (PRILOSEC) 20 MG capsule TAKE (1) CAPSULE BY MOUTH EVERY DAY  . polyethylene glycol (MIRALAX / GLYCOLAX) 17 g packet Take 17 g by mouth daily as needed.  . sertraline (ZOLOFT) 50 MG tablet TAKE (1) TABLET BY MOUTH EVERY DAY  . traZODone (  DESYREL) 50 MG tablet TAKE (1) TABLET BY MOUTH DAILY AT BEDTIME  . [DISCONTINUED] ALLERGY RELIEF 10 MG tablet TAKE (1) TABLET BY MOUTH EVERY DAY (Patient taking differently: Take 10 mg by mouth daily.)  . [DISCONTINUED] cefUROXime (CEFTIN) 250 MG tablet Take 1 tablet (250 mg total) by mouth 2 (two) times daily with a meal.   No facility-administered encounter  medications on file as of 05/20/2020.    Allergies (verified) Sulfa antibiotics   History: Past Medical History:  Diagnosis Date  . Allergy   . Asthma   . Breast cancer ([place])   . Cancer [facility]) 2012   right breast cancer lumpectomy with rad tx  . Depression   . Edema   . GERD (gastroesophageal reflux disease)   . Gout   . Hyperlipidemia   . Hypertension   . Personal history of radiation therapy    Past Surgical History:  Procedure Laterality Date  . BREAST BIOPSY Bilateral    negative  . BREAST EXCISIONAL BIOPSY Right    right positive 04/2010  . BREAST LUMPECTOMY Right    removed a cancerous lump  . VAGINAL HYSTERECTOMY     Family History  Problem Relation Age of Onset  . Leukemia Mother   . Heart disease Father   . Breast cancer Neg Hx    Social History   Socioeconomic History  . Marital status: Widowed    Spouse name: Not on file  . Number of children: 2  . Years of education: some college  . Highest education level: 12th grade  Occupational History  . Occupation: Retired  Tobacco Use  . Smoking status: Former Smoker    Packs/day: 0.25    Years: 2.00    Pack years: 0.50    Types: Cigarettes  . Smokeless tobacco: Never Used  . Tobacco comment: smoking cessation materials not required  Vaping Use  . Vaping Use: Never used  Substance and Sexual Activity  . Alcohol use: No    Alcohol/week: 0.0 standard drinks  . Drug use: No  . Sexual activity: Not Currently  Other Topics Concern  . Not on file  Social History Narrative   Granddaughter lives at home with patient.   Social Determinants of Health   Financial Resource Strain: Low Risk   . Difficulty of Paying Living Expenses: Not hard at all  Food Insecurity: No Food Insecurity  . Worried About Charity fundraiser in the Last Year: Never true  . Ran Out of Food in the Last Year: Never true  Transportation Needs: No Transportation Needs  . Lack of Transportation (Medical): No  . Lack of  Transportation (Non-Medical): No  Physical Activity: Inactive  . Days of Exercise per Week: 0 days  . Minutes of Exercise per Session: 0 min  Stress: No Stress Concern Present  . Feeling of Stress : Not at all  Social Connections: Socially Isolated  . Frequency of Communication with Friends and Family: More than three times a week  . Frequency of Social Gatherings with Friends and Family: More than three times a week  . Attends Religious Services: Never  . Active Member of Clubs or Organizations: No  . Attends Archivist Meetings: Never  . Marital Status: Widowed    Tobacco Counseling Counseling given: Not Answered Comment: smoking cessation materials not required   Clinical Intake:  Pre-visit preparation completed: Yes  Pain : No/denies pain     Nutritional Status: BMI > 30  Obese Nutritional Risks: None Diabetes:  No  How often do you need to have someone help you when you read instructions, pamphlets, or other written materials from your doctor or pharmacy?: 1 - Never    Interpreter Needed?: No  Information entered by :: Clemetine Marker LPN   Activities of Daily Living In your present state of health, do you have any difficulty performing the following activities: 05/20/2020 02/28/2020  Hearing? Y N  Comment declines hearing aids -  Vision? N N  Difficulty concentrating or making decisions? Y N  Walking or climbing stairs? Y Y  Dressing or bathing? N N  Doing errands, shopping? Tempie Donning  Preparing Food and eating ? N -  Using the Toilet? N -  In the past six months, have you accidently leaked urine? Y -  Comment wears depends for protection -  Do you have problems with loss of bowel control? N -  Managing your Medications? Y -  Managing your Finances? Y -  Housekeeping or managing your Housekeeping? N -  Some recent data might be hidden    Patient Care Team: [name], MD as PCP - General (Family Medicine)  Indicate any recent Medical Services  you may have received from other than [facility] providers in the past year (date may be approximate).     Assessment:   This is a routine wellness examination for [place].  Hearing/Vision screen  Hearing Screening   125Hz  250Hz  500Hz  1000Hz  2000Hz  3000Hz  4000Hz  6000Hz  8000Hz   Right ear:           Left ear:           Comments: Pt c/o mild hearing difficulty; declines hearing aids   Vision Screening Comments: [facility] vision screenings at [facility]  Dietary issues and exercise activities discussed: Current Exercise Habits: The patient does not participate in regular exercise at present, Exercise limited by: orthopedic condition(s)  Goals    . DIET - INCREASE WATER INTAKE     Recommend to drink at least 6-8 8oz glasses of water per day.      Depression Screen PHQ 2/9 Scores 05/20/2020 02/23/2020 12/21/2019 11/14/2019 09/14/2019 05/10/2019 04/11/2019  PHQ - 2 Score 2 2 2  0 0 0 0  PHQ- 9 Score 5 9 5  0 2 - 0    Fall Risk Fall Risk  05/20/2020 02/23/2020 11/14/2019 09/14/2019 05/10/2019  Falls in the past year? 1 1 0 1 0  Number falls in past yr: 1 0 0 1 0  Injury with Fall? 1 0 0 0 0  Risk for fall due to : History of fall(s) - No Fall Risks History of fall(s) Impaired balance/gait;Impaired mobility  Risk for fall due to: Comment - - - - -  Follow up Falls prevention discussed Falls evaluation completed Falls evaluation completed Falls evaluation completed Falls prevention discussed    FALL RISK PREVENTION PERTAINING TO THE HOME:  Any stairs in or around the home? Yes If so, are there any without handrails? No  Home free of loose throw rugs in walkways, pet beds, electrical cords, etc? Yes  Adequate lighting in your home to reduce risk of falls? Yes   ASSISTIVE DEVICES UTILIZED TO PREVENT FALLS:  Life alert? No  Use of a cane, walker or w/c? No  Grab bars in the bathroom? Yes  Shower chair or bench in shower? Yes  Elevated toilet seat or a handicapped toilet? Yes   TIMED UP AND  GO:  Was the test performed? No . Telephonic visit.  Cognitive Function: pt declined 6CIT for 2022 AWV     6CIT Screen 05/10/2019 12/13/2017  What Year? 0 points 0 points  What month? 0 points 0 points  What time? 0 points 0 points  Count back from 20 0 points 0 points  Months in reverse 4 points 0 points  Repeat phrase 4 points 2 points  Total Score 8 2    Immunizations Immunization History  Administered Date(s) Administered  . Fluad Quad(high Dose 65+) 04/11/2019, 12/21/2019  . Influenza, High Dose Seasonal PF 12/31/2016  . PFIZER(Purple Top)SARS-COV-2 Vaccination 07/14/2019, 08/08/2019  . Pneumococcal Conjugate-13 12/31/2016  . Pneumococcal Polysaccharide-23 07/26/2018  . Tdap 05/20/2015    TDAP status: Up to date  Flu Vaccine status: Up to date  Pneumococcal vaccine status: Up to date  Covid-19 vaccine status: Completed vaccines  Qualifies for Shingles Vaccine? Yes   Zostavax completed No   Shingrix Completed?: No.    Education has been provided regarding the importance of this vaccine. Patient has been advised to call insurance company to determine out of pocket expense if they have not yet received this vaccine. Advised may also receive vaccine at local pharmacy or Health Dept. Verbalized acceptance and understanding.  Screening Tests Health Maintenance  Topic Date Due  . COVID-19 Vaccine (3 - Pfizer risk 4-dose series) 09/05/2019  . MAMMOGRAM  07/03/2020  . TETANUS/TDAP  05/19/2025  . INFLUENZA VACCINE  Completed  . DEXA SCAN  Completed  . PNA vac Low Risk Adult  Completed    Health Maintenance  Health Maintenance Due  Topic Date Due  . COVID-19 Vaccine (3 - Pfizer risk 4-dose series) 09/05/2019    Colorectal cancer screening: No longer required.   Mammogram status: Completed 07/04/19. Repeat every year  Bone Density status: Completed 01/03/18. Results reflect: Bone density results: OSTEOPENIA. Repeat every 2 years. pt declines repeat screening at  this time.   Lung Cancer Screening: (Low Dose CT Chest recommended if Age 74-80 years, 30 pack-year currently smoking OR have quit w/in 15years.) does not qualify.   Additional Screening:  Hepatitis C Screening: does not qualify  Vision Screening: Recommended annual ophthalmology exams for early detection of glaucoma and other disorders of the eye. Is the patient up to date with their annual eye exam?  Yes  Who is the provider or what is the name of the office in which the patient attends annual eye exams? [place] Screening: Recommended annual dental exams for proper oral hygiene  Community Resource Referral / Chronic Care Management: CRR required this visit?  No   CCM required this visit?  No      Plan:     I have personally reviewed and noted the following in the patient's chart:   . Medical and social history . Use of alcohol, tobacco or illicit drugs  . Current medications and supplements . Functional ability and status . Nutritional status . Physical activity . Advanced directives . List of other physicians . Hospitalizations, surgeries, and ER visits in previous 12 months . Vitals . Screenings to include cognitive, depression, and falls . Referrals and appointments  In addition, I have reviewed and discussed with patient certain preventive protocols, quality metrics, and best practice recommendations. A written personalized care plan for preventive services as well as general preventive health recommendations were provided to patient.     Clemetine Marker, LPN   [id number]   Nurse Notes: none

## 2020-05-20 NOTE — Patient Instructions (Signed)
Kirsten Snyder , Thank you for taking time to come for your Medicare Wellness Visit. I appreciate your ongoing commitment to your health goals. Please review the following plan we discussed and let me know if I can assist you in the future.   Screening recommendations/referrals: Colonoscopy: no longer required Mammogram: done 07/04/19 Bone Density: done 01/03/18 Recommended yearly ophthalmology/optometry visit for glaucoma screening and checkup Recommended yearly dental visit for hygiene and checkup  Vaccinations: Influenza vaccine: done 12/21/19 Pneumococcal vaccine: done 07/26/18 Tdap vaccine: done 05/20/15 Shingles vaccine: Shingrix discussed. Please contact your pharmacy for coverage information.  Covid-19: done 07/14/19 & 08/08/19  Advanced directives: Please bring a copy of your health care power of attorney and living will to the office at your convenience.  Conditions/risks identified: Recommend fall prevention in the home  Next appointment: Follow up in one year for your annual wellness visit    Preventive Care 65 Years and Older, Female Preventive care refers to lifestyle choices and visits with your health care provider that can promote health and wellness. What does preventive care include?  A yearly physical exam. This is also called an annual well check.  Dental exams once or twice a year.  Routine eye exams. Ask your health care provider how often you should have your eyes checked.  Personal lifestyle choices, including:  Daily care of your teeth and gums.  Regular physical activity.  Eating a healthy diet.  Avoiding tobacco and drug use.  Limiting alcohol use.  Practicing safe sex.  Taking low-dose aspirin every day.  Taking vitamin and mineral supplements as recommended by your health care provider. What happens during an annual well check? The services and screenings done by your health care provider during your annual well check will depend on your age,  overall health, lifestyle risk factors, and family history of disease. Counseling  Your health care provider may ask you questions about your:  Alcohol use.  Tobacco use.  Drug use.  Emotional well-being.  Home and relationship well-being.  Sexual activity.  Eating habits.  History of falls.  Memory and ability to understand (cognition).  Work and work Statistician.  Reproductive health. Screening  You may have the following tests or measurements:  Height, weight, and BMI.  Blood pressure.  Lipid and cholesterol levels. These may be checked every 5 years, or more frequently if you are over 54 years old.  Skin check.  Lung cancer screening. You may have this screening every year starting at age 42 if you have a 30-pack-year history of smoking and currently smoke or have quit within the past 15 years.  Fecal occult blood test (FOBT) of the stool. You may have this test every year starting at age 61.  Flexible sigmoidoscopy or colonoscopy. You may have a sigmoidoscopy every 5 years or a colonoscopy every 10 years starting at age 43.  Hepatitis C blood test.  Hepatitis B blood test.  Sexually transmitted disease (STD) testing.  Diabetes screening. This is done by checking your blood sugar (glucose) after you have not eaten for a while (fasting). You may have this done every 1-3 years.  Bone density scan. This is done to screen for osteoporosis. You may have this done starting at age 90.  Mammogram. This may be done every 1-2 years. Talk to your health care provider about how often you should have regular mammograms. Talk with your health care provider about your test results, treatment options, and if necessary, the need for more tests. Vaccines  Your health care provider may recommend certain vaccines, such as:  Influenza vaccine. This is recommended every year.  Tetanus, diphtheria, and acellular pertussis (Tdap, Td) vaccine. You may need a Td booster every 10  years.  Zoster vaccine. You may need this after age 65.  Pneumococcal 13-valent conjugate (PCV13) vaccine. One dose is recommended after age 14.  Pneumococcal polysaccharide (PPSV23) vaccine. One dose is recommended after age 45. Talk to your health care provider about which screenings and vaccines you need and how often you need them. This information is not intended to replace advice given to you by your health care provider. Make sure you discuss any questions you have with your health care provider. Document Released: 05/03/2015 Document Revised: 12/25/2015 Document Reviewed: 02/05/2015 Elsevier Interactive Patient Education  2017 [place] Prevention in the Home Falls can cause injuries. They can happen to people of all ages. There are many things you can do to make your home safe and to help prevent falls. What can I do on the outside of my home?  Regularly fix the edges of walkways and driveways and fix any cracks.  Remove anything that might make you trip as you walk through a door, such as a raised step or threshold.  Trim any bushes or trees on the path to your home.  Use bright outdoor lighting.  Clear any walking paths of anything that might make someone trip, such as rocks or tools.  Regularly check to see if handrails are loose or broken. Make sure that both sides of any steps have handrails.  Any raised decks and porches should have guardrails on the edges.  Have any leaves, snow, or ice cleared regularly.  Use sand or salt on walking paths during winter.  Clean up any spills in your garage right away. This includes oil or grease spills. What can I do in the bathroom?  Use night lights.  Install grab bars by the toilet and in the tub and shower. Do not use towel bars as grab bars.  Use non-skid mats or decals in the tub or shower.  If you need to sit down in the shower, use a plastic, non-slip stool.  Keep the floor dry. Clean up any water that  spills on the floor as soon as it happens.  Remove soap buildup in the tub or shower regularly.  Attach bath mats securely with double-sided non-slip rug tape.  Do not have throw rugs and other things on the floor that can make you trip. What can I do in the bedroom?  Use night lights.  Make sure that you have a light by your bed that is easy to reach.  Do not use any sheets or blankets that are too big for your bed. They should not hang down onto the floor.  Have a firm chair that has side arms. You can use this for support while you get dressed.  Do not have throw rugs and other things on the floor that can make you trip. What can I do in the kitchen?  Clean up any spills right away.  Avoid walking on wet floors.  Keep items that you use a lot in easy-to-reach places.  If you need to reach something above you, use a strong step stool that has a grab bar.  Keep electrical cords out of the way.  Do not use floor polish or wax that makes floors slippery. If you must use wax, use non-skid floor wax.  Do  not have throw rugs and other things on the floor that can make you trip. What can I do with my stairs?  Do not leave any items on the stairs.  Make sure that there are handrails on both sides of the stairs and use them. Fix handrails that are broken or loose. Make sure that handrails are as long as the stairways.  Check any carpeting to make sure that it is firmly attached to the stairs. Fix any carpet that is loose or worn.  Avoid having throw rugs at the top or bottom of the stairs. If you do have throw rugs, attach them to the floor with carpet tape.  Make sure that you have a light switch at the top of the stairs and the bottom of the stairs. If you do not have them, ask someone to add them for you. What else can I do to help prevent falls?  Wear shoes that:  Do not have high heels.  Have rubber bottoms.  Are comfortable and fit you well.  Are closed at the  toe. Do not wear sandals.  If you use a stepladder:  Make sure that it is fully opened. Do not climb a closed stepladder.  Make sure that both sides of the stepladder are locked into place.  Ask someone to hold it for you, if possible.  Clearly mark and make sure that you can see:  Any grab bars or handrails.  First and last steps.  Where the edge of each step is.  Use tools that help you move around (mobility aids) if they are needed. These include:  Canes.  Walkers.  Scooters.  Crutches.  Turn on the lights when you go into a dark area. Replace any light bulbs as soon as they burn out.  Set up your furniture so you have a clear path. Avoid moving your furniture around.  If any of your floors are uneven, fix them.  If there are any pets around you, be aware of where they are.  Review your medicines with your doctor. Some medicines can make you feel dizzy. This can increase your chance of falling. Ask your doctor what other things that you can do to help prevent falls. This information is not intended to replace advice given to you by your health care provider. Make sure you discuss any questions you have with your health care provider. Document Released: 01/31/2009 Document Revised: 09/12/2015 Document Reviewed: 05/11/2014 Elsevier Interactive Patient Education  2017 [employer].

## 2020-05-21 DIAGNOSIS — F418 Other specified anxiety disorders: Secondary | ICD-10-CM | POA: Diagnosis not present

## 2020-05-21 DIAGNOSIS — D509 Iron deficiency anemia, unspecified: Secondary | ICD-10-CM | POA: Diagnosis not present

## 2020-05-21 DIAGNOSIS — F32A Depression, unspecified: Secondary | ICD-10-CM | POA: Diagnosis not present

## 2020-05-21 DIAGNOSIS — I10 Essential (primary) hypertension: Secondary | ICD-10-CM | POA: Diagnosis not present

## 2020-05-21 DIAGNOSIS — S76311D Strain of muscle, fascia and tendon of the posterior muscle group at thigh level, right thigh, subsequent encounter: Secondary | ICD-10-CM | POA: Diagnosis not present

## 2020-05-21 DIAGNOSIS — J45909 Unspecified asthma, uncomplicated: Secondary | ICD-10-CM | POA: Diagnosis not present

## 2020-05-21 DIAGNOSIS — R21 Rash and other nonspecific skin eruption: Secondary | ICD-10-CM | POA: Diagnosis not present

## 2020-05-21 DIAGNOSIS — I4891 Unspecified atrial fibrillation: Secondary | ICD-10-CM | POA: Diagnosis not present

## 2020-05-21 DIAGNOSIS — M109 Gout, unspecified: Secondary | ICD-10-CM | POA: Diagnosis not present

## 2020-05-24 DIAGNOSIS — I4891 Unspecified atrial fibrillation: Secondary | ICD-10-CM | POA: Diagnosis not present

## 2020-05-24 DIAGNOSIS — D509 Iron deficiency anemia, unspecified: Secondary | ICD-10-CM | POA: Diagnosis not present

## 2020-05-24 DIAGNOSIS — S76311D Strain of muscle, fascia and tendon of the posterior muscle group at thigh level, right thigh, subsequent encounter: Secondary | ICD-10-CM | POA: Diagnosis not present

## 2020-05-24 DIAGNOSIS — R21 Rash and other nonspecific skin eruption: Secondary | ICD-10-CM | POA: Diagnosis not present

## 2020-05-24 DIAGNOSIS — F32A Depression, unspecified: Secondary | ICD-10-CM | POA: Diagnosis not present

## 2020-05-24 DIAGNOSIS — M109 Gout, unspecified: Secondary | ICD-10-CM | POA: Diagnosis not present

## 2020-05-24 DIAGNOSIS — I10 Essential (primary) hypertension: Secondary | ICD-10-CM | POA: Diagnosis not present

## 2020-05-24 DIAGNOSIS — F418 Other specified anxiety disorders: Secondary | ICD-10-CM | POA: Diagnosis not present

## 2020-05-24 DIAGNOSIS — J45909 Unspecified asthma, uncomplicated: Secondary | ICD-10-CM | POA: Diagnosis not present

## 2020-05-24 NOTE — Telephone Encounter (Signed)
Called Mr. Kirsten Snyder and had to leave a message on his machine. Gave the okay for pt

## 2020-05-24 NOTE — Telephone Encounter (Signed)
Home Health Verbal Orders - Caller/Agency: Mr. Tye Savoy / [facility]  Callback Number: [phone] Requesting PT Frequency: 1x a week for 6 weeks

## 2020-05-27 DIAGNOSIS — I10 Essential (primary) hypertension: Secondary | ICD-10-CM | POA: Diagnosis not present

## 2020-05-27 DIAGNOSIS — D509 Iron deficiency anemia, unspecified: Secondary | ICD-10-CM | POA: Diagnosis not present

## 2020-05-27 DIAGNOSIS — R21 Rash and other nonspecific skin eruption: Secondary | ICD-10-CM | POA: Diagnosis not present

## 2020-05-27 DIAGNOSIS — F32A Depression, unspecified: Secondary | ICD-10-CM | POA: Diagnosis not present

## 2020-05-27 DIAGNOSIS — S76311D Strain of muscle, fascia and tendon of the posterior muscle group at thigh level, right thigh, subsequent encounter: Secondary | ICD-10-CM | POA: Diagnosis not present

## 2020-05-27 DIAGNOSIS — M109 Gout, unspecified: Secondary | ICD-10-CM | POA: Diagnosis not present

## 2020-05-27 DIAGNOSIS — F418 Other specified anxiety disorders: Secondary | ICD-10-CM | POA: Diagnosis not present

## 2020-05-27 DIAGNOSIS — I4891 Unspecified atrial fibrillation: Secondary | ICD-10-CM | POA: Diagnosis not present

## 2020-05-27 DIAGNOSIS — J45909 Unspecified asthma, uncomplicated: Secondary | ICD-10-CM | POA: Diagnosis not present

## 2020-05-30 DIAGNOSIS — J45909 Unspecified asthma, uncomplicated: Secondary | ICD-10-CM | POA: Diagnosis not present

## 2020-05-30 DIAGNOSIS — I10 Essential (primary) hypertension: Secondary | ICD-10-CM | POA: Diagnosis not present

## 2020-05-30 DIAGNOSIS — R21 Rash and other nonspecific skin eruption: Secondary | ICD-10-CM | POA: Diagnosis not present

## 2020-05-30 DIAGNOSIS — F418 Other specified anxiety disorders: Secondary | ICD-10-CM | POA: Diagnosis not present

## 2020-05-30 DIAGNOSIS — M109 Gout, unspecified: Secondary | ICD-10-CM | POA: Diagnosis not present

## 2020-05-30 DIAGNOSIS — R262 Difficulty in walking, not elsewhere classified: Secondary | ICD-10-CM | POA: Diagnosis not present

## 2020-05-30 DIAGNOSIS — S76311D Strain of muscle, fascia and tendon of the posterior muscle group at thigh level, right thigh, subsequent encounter: Secondary | ICD-10-CM | POA: Diagnosis not present

## 2020-05-30 DIAGNOSIS — D509 Iron deficiency anemia, unspecified: Secondary | ICD-10-CM | POA: Diagnosis not present

## 2020-05-30 DIAGNOSIS — M6281 Muscle weakness (generalized): Secondary | ICD-10-CM | POA: Diagnosis not present

## 2020-05-30 DIAGNOSIS — I4891 Unspecified atrial fibrillation: Secondary | ICD-10-CM | POA: Diagnosis not present

## 2020-05-30 DIAGNOSIS — F32A Depression, unspecified: Secondary | ICD-10-CM | POA: Diagnosis not present

## 2020-06-03 ENCOUNTER — Other Ambulatory Visit: Payer: Self-pay | Admitting: Family Medicine

## 2020-06-03 DIAGNOSIS — F418 Other specified anxiety disorders: Secondary | ICD-10-CM | POA: Diagnosis not present

## 2020-06-03 DIAGNOSIS — F32A Depression, unspecified: Secondary | ICD-10-CM | POA: Diagnosis not present

## 2020-06-03 DIAGNOSIS — S76311D Strain of muscle, fascia and tendon of the posterior muscle group at thigh level, right thigh, subsequent encounter: Secondary | ICD-10-CM | POA: Diagnosis not present

## 2020-06-03 DIAGNOSIS — J45909 Unspecified asthma, uncomplicated: Secondary | ICD-10-CM | POA: Diagnosis not present

## 2020-06-03 DIAGNOSIS — D509 Iron deficiency anemia, unspecified: Secondary | ICD-10-CM | POA: Diagnosis not present

## 2020-06-03 DIAGNOSIS — I4891 Unspecified atrial fibrillation: Secondary | ICD-10-CM | POA: Diagnosis not present

## 2020-06-03 DIAGNOSIS — M109 Gout, unspecified: Secondary | ICD-10-CM | POA: Diagnosis not present

## 2020-06-03 DIAGNOSIS — R21 Rash and other nonspecific skin eruption: Secondary | ICD-10-CM | POA: Diagnosis not present

## 2020-06-03 DIAGNOSIS — E7849 Other hyperlipidemia: Secondary | ICD-10-CM

## 2020-06-03 DIAGNOSIS — I10 Essential (primary) hypertension: Secondary | ICD-10-CM | POA: Diagnosis not present

## 2020-06-05 ENCOUNTER — Other Ambulatory Visit: Payer: Self-pay | Admitting: Family Medicine

## 2020-06-05 DIAGNOSIS — E790 Hyperuricemia without signs of inflammatory arthritis and tophaceous disease: Secondary | ICD-10-CM

## 2020-06-05 DIAGNOSIS — F5101 Primary insomnia: Secondary | ICD-10-CM

## 2020-06-05 NOTE — Telephone Encounter (Signed)
Future visit in 2 months  

## 2020-06-06 DIAGNOSIS — F418 Other specified anxiety disorders: Secondary | ICD-10-CM | POA: Diagnosis not present

## 2020-06-06 DIAGNOSIS — R21 Rash and other nonspecific skin eruption: Secondary | ICD-10-CM | POA: Diagnosis not present

## 2020-06-06 DIAGNOSIS — D509 Iron deficiency anemia, unspecified: Secondary | ICD-10-CM | POA: Diagnosis not present

## 2020-06-06 DIAGNOSIS — I4891 Unspecified atrial fibrillation: Secondary | ICD-10-CM | POA: Diagnosis not present

## 2020-06-06 DIAGNOSIS — F32A Depression, unspecified: Secondary | ICD-10-CM | POA: Diagnosis not present

## 2020-06-06 DIAGNOSIS — J45909 Unspecified asthma, uncomplicated: Secondary | ICD-10-CM | POA: Diagnosis not present

## 2020-06-06 DIAGNOSIS — I10 Essential (primary) hypertension: Secondary | ICD-10-CM | POA: Diagnosis not present

## 2020-06-06 DIAGNOSIS — M109 Gout, unspecified: Secondary | ICD-10-CM | POA: Diagnosis not present

## 2020-06-06 DIAGNOSIS — S76311D Strain of muscle, fascia and tendon of the posterior muscle group at thigh level, right thigh, subsequent encounter: Secondary | ICD-10-CM | POA: Diagnosis not present

## 2020-06-08 ENCOUNTER — Other Ambulatory Visit: Payer: Self-pay | Admitting: Family Medicine

## 2020-06-08 DIAGNOSIS — Z86711 Personal history of pulmonary embolism: Secondary | ICD-10-CM

## 2020-06-10 DIAGNOSIS — R21 Rash and other nonspecific skin eruption: Secondary | ICD-10-CM | POA: Diagnosis not present

## 2020-06-10 DIAGNOSIS — D509 Iron deficiency anemia, unspecified: Secondary | ICD-10-CM | POA: Diagnosis not present

## 2020-06-10 DIAGNOSIS — I4891 Unspecified atrial fibrillation: Secondary | ICD-10-CM | POA: Diagnosis not present

## 2020-06-10 DIAGNOSIS — S76311D Strain of muscle, fascia and tendon of the posterior muscle group at thigh level, right thigh, subsequent encounter: Secondary | ICD-10-CM | POA: Diagnosis not present

## 2020-06-10 DIAGNOSIS — M109 Gout, unspecified: Secondary | ICD-10-CM | POA: Diagnosis not present

## 2020-06-10 DIAGNOSIS — F32A Depression, unspecified: Secondary | ICD-10-CM | POA: Diagnosis not present

## 2020-06-10 DIAGNOSIS — I10 Essential (primary) hypertension: Secondary | ICD-10-CM | POA: Diagnosis not present

## 2020-06-10 DIAGNOSIS — F418 Other specified anxiety disorders: Secondary | ICD-10-CM | POA: Diagnosis not present

## 2020-06-10 DIAGNOSIS — J45909 Unspecified asthma, uncomplicated: Secondary | ICD-10-CM | POA: Diagnosis not present

## 2020-06-18 ENCOUNTER — Other Ambulatory Visit: Payer: Self-pay | Admitting: Family Medicine

## 2020-06-18 DIAGNOSIS — J45909 Unspecified asthma, uncomplicated: Secondary | ICD-10-CM | POA: Diagnosis not present

## 2020-06-18 DIAGNOSIS — F3341 Major depressive disorder, recurrent, in partial remission: Secondary | ICD-10-CM

## 2020-06-18 DIAGNOSIS — M109 Gout, unspecified: Secondary | ICD-10-CM | POA: Diagnosis not present

## 2020-06-18 DIAGNOSIS — F418 Other specified anxiety disorders: Secondary | ICD-10-CM | POA: Diagnosis not present

## 2020-06-18 DIAGNOSIS — S76311D Strain of muscle, fascia and tendon of the posterior muscle group at thigh level, right thigh, subsequent encounter: Secondary | ICD-10-CM | POA: Diagnosis not present

## 2020-06-18 DIAGNOSIS — I4891 Unspecified atrial fibrillation: Secondary | ICD-10-CM | POA: Diagnosis not present

## 2020-06-18 DIAGNOSIS — R21 Rash and other nonspecific skin eruption: Secondary | ICD-10-CM | POA: Diagnosis not present

## 2020-06-18 DIAGNOSIS — F32A Depression, unspecified: Secondary | ICD-10-CM | POA: Diagnosis not present

## 2020-06-18 DIAGNOSIS — I10 Essential (primary) hypertension: Secondary | ICD-10-CM | POA: Diagnosis not present

## 2020-06-18 DIAGNOSIS — D509 Iron deficiency anemia, unspecified: Secondary | ICD-10-CM | POA: Diagnosis not present

## 2020-06-20 DIAGNOSIS — M109 Gout, unspecified: Secondary | ICD-10-CM | POA: Diagnosis not present

## 2020-06-20 DIAGNOSIS — F32A Depression, unspecified: Secondary | ICD-10-CM | POA: Diagnosis not present

## 2020-06-20 DIAGNOSIS — J45909 Unspecified asthma, uncomplicated: Secondary | ICD-10-CM | POA: Diagnosis not present

## 2020-06-20 DIAGNOSIS — D509 Iron deficiency anemia, unspecified: Secondary | ICD-10-CM | POA: Diagnosis not present

## 2020-06-20 DIAGNOSIS — I10 Essential (primary) hypertension: Secondary | ICD-10-CM | POA: Diagnosis not present

## 2020-06-20 DIAGNOSIS — R21 Rash and other nonspecific skin eruption: Secondary | ICD-10-CM | POA: Diagnosis not present

## 2020-06-20 DIAGNOSIS — I4891 Unspecified atrial fibrillation: Secondary | ICD-10-CM | POA: Diagnosis not present

## 2020-06-20 DIAGNOSIS — S76311D Strain of muscle, fascia and tendon of the posterior muscle group at thigh level, right thigh, subsequent encounter: Secondary | ICD-10-CM | POA: Diagnosis not present

## 2020-06-20 DIAGNOSIS — F418 Other specified anxiety disorders: Secondary | ICD-10-CM | POA: Diagnosis not present

## 2020-06-25 DIAGNOSIS — F32A Depression, unspecified: Secondary | ICD-10-CM | POA: Diagnosis not present

## 2020-06-25 DIAGNOSIS — F418 Other specified anxiety disorders: Secondary | ICD-10-CM | POA: Diagnosis not present

## 2020-06-25 DIAGNOSIS — R21 Rash and other nonspecific skin eruption: Secondary | ICD-10-CM | POA: Diagnosis not present

## 2020-06-25 DIAGNOSIS — I4891 Unspecified atrial fibrillation: Secondary | ICD-10-CM | POA: Diagnosis not present

## 2020-06-25 DIAGNOSIS — M109 Gout, unspecified: Secondary | ICD-10-CM | POA: Diagnosis not present

## 2020-06-25 DIAGNOSIS — S76311D Strain of muscle, fascia and tendon of the posterior muscle group at thigh level, right thigh, subsequent encounter: Secondary | ICD-10-CM | POA: Diagnosis not present

## 2020-06-25 DIAGNOSIS — D509 Iron deficiency anemia, unspecified: Secondary | ICD-10-CM | POA: Diagnosis not present

## 2020-06-25 DIAGNOSIS — I10 Essential (primary) hypertension: Secondary | ICD-10-CM | POA: Diagnosis not present

## 2020-06-25 DIAGNOSIS — J45909 Unspecified asthma, uncomplicated: Secondary | ICD-10-CM | POA: Diagnosis not present

## 2020-06-27 DIAGNOSIS — M6281 Muscle weakness (generalized): Secondary | ICD-10-CM | POA: Diagnosis not present

## 2020-06-27 DIAGNOSIS — R262 Difficulty in walking, not elsewhere classified: Secondary | ICD-10-CM | POA: Diagnosis not present

## 2020-07-01 DIAGNOSIS — D509 Iron deficiency anemia, unspecified: Secondary | ICD-10-CM | POA: Diagnosis not present

## 2020-07-01 DIAGNOSIS — R21 Rash and other nonspecific skin eruption: Secondary | ICD-10-CM | POA: Diagnosis not present

## 2020-07-01 DIAGNOSIS — M109 Gout, unspecified: Secondary | ICD-10-CM | POA: Diagnosis not present

## 2020-07-01 DIAGNOSIS — J45909 Unspecified asthma, uncomplicated: Secondary | ICD-10-CM | POA: Diagnosis not present

## 2020-07-01 DIAGNOSIS — I10 Essential (primary) hypertension: Secondary | ICD-10-CM | POA: Diagnosis not present

## 2020-07-01 DIAGNOSIS — I4891 Unspecified atrial fibrillation: Secondary | ICD-10-CM | POA: Diagnosis not present

## 2020-07-01 DIAGNOSIS — S76311D Strain of muscle, fascia and tendon of the posterior muscle group at thigh level, right thigh, subsequent encounter: Secondary | ICD-10-CM | POA: Diagnosis not present

## 2020-07-01 DIAGNOSIS — F32A Depression, unspecified: Secondary | ICD-10-CM | POA: Diagnosis not present

## 2020-07-01 DIAGNOSIS — F418 Other specified anxiety disorders: Secondary | ICD-10-CM | POA: Diagnosis not present

## 2020-07-03 DIAGNOSIS — I10 Essential (primary) hypertension: Secondary | ICD-10-CM | POA: Diagnosis not present

## 2020-07-03 DIAGNOSIS — I4891 Unspecified atrial fibrillation: Secondary | ICD-10-CM | POA: Diagnosis not present

## 2020-07-03 DIAGNOSIS — S76311D Strain of muscle, fascia and tendon of the posterior muscle group at thigh level, right thigh, subsequent encounter: Secondary | ICD-10-CM | POA: Diagnosis not present

## 2020-07-03 DIAGNOSIS — F32A Depression, unspecified: Secondary | ICD-10-CM | POA: Diagnosis not present

## 2020-07-03 DIAGNOSIS — D509 Iron deficiency anemia, unspecified: Secondary | ICD-10-CM | POA: Diagnosis not present

## 2020-07-03 DIAGNOSIS — F418 Other specified anxiety disorders: Secondary | ICD-10-CM | POA: Diagnosis not present

## 2020-07-03 DIAGNOSIS — R21 Rash and other nonspecific skin eruption: Secondary | ICD-10-CM | POA: Diagnosis not present

## 2020-07-03 DIAGNOSIS — M109 Gout, unspecified: Secondary | ICD-10-CM | POA: Diagnosis not present

## 2020-07-03 DIAGNOSIS — J45909 Unspecified asthma, uncomplicated: Secondary | ICD-10-CM | POA: Diagnosis not present

## 2020-07-17 DIAGNOSIS — I10 Essential (primary) hypertension: Secondary | ICD-10-CM | POA: Diagnosis not present

## 2020-07-17 DIAGNOSIS — R21 Rash and other nonspecific skin eruption: Secondary | ICD-10-CM | POA: Diagnosis not present

## 2020-07-17 DIAGNOSIS — J45909 Unspecified asthma, uncomplicated: Secondary | ICD-10-CM | POA: Diagnosis not present

## 2020-07-17 DIAGNOSIS — F32A Depression, unspecified: Secondary | ICD-10-CM | POA: Diagnosis not present

## 2020-07-17 DIAGNOSIS — S76311D Strain of muscle, fascia and tendon of the posterior muscle group at thigh level, right thigh, subsequent encounter: Secondary | ICD-10-CM | POA: Diagnosis not present

## 2020-07-17 DIAGNOSIS — M109 Gout, unspecified: Secondary | ICD-10-CM | POA: Diagnosis not present

## 2020-07-17 DIAGNOSIS — D509 Iron deficiency anemia, unspecified: Secondary | ICD-10-CM | POA: Diagnosis not present

## 2020-07-17 DIAGNOSIS — I4891 Unspecified atrial fibrillation: Secondary | ICD-10-CM | POA: Diagnosis not present

## 2020-07-17 DIAGNOSIS — F418 Other specified anxiety disorders: Secondary | ICD-10-CM | POA: Diagnosis not present

## 2020-07-22 DIAGNOSIS — I10 Essential (primary) hypertension: Secondary | ICD-10-CM | POA: Diagnosis not present

## 2020-07-22 DIAGNOSIS — I4891 Unspecified atrial fibrillation: Secondary | ICD-10-CM | POA: Diagnosis not present

## 2020-07-22 DIAGNOSIS — J45909 Unspecified asthma, uncomplicated: Secondary | ICD-10-CM | POA: Diagnosis not present

## 2020-07-22 DIAGNOSIS — S76311D Strain of muscle, fascia and tendon of the posterior muscle group at thigh level, right thigh, subsequent encounter: Secondary | ICD-10-CM | POA: Diagnosis not present

## 2020-07-22 DIAGNOSIS — F418 Other specified anxiety disorders: Secondary | ICD-10-CM | POA: Diagnosis not present

## 2020-07-22 DIAGNOSIS — R21 Rash and other nonspecific skin eruption: Secondary | ICD-10-CM | POA: Diagnosis not present

## 2020-07-22 DIAGNOSIS — M109 Gout, unspecified: Secondary | ICD-10-CM | POA: Diagnosis not present

## 2020-07-22 DIAGNOSIS — F32A Depression, unspecified: Secondary | ICD-10-CM | POA: Diagnosis not present

## 2020-07-22 DIAGNOSIS — D509 Iron deficiency anemia, unspecified: Secondary | ICD-10-CM | POA: Diagnosis not present

## 2020-07-28 DIAGNOSIS — R262 Difficulty in walking, not elsewhere classified: Secondary | ICD-10-CM | POA: Diagnosis not present

## 2020-07-28 DIAGNOSIS — M6281 Muscle weakness (generalized): Secondary | ICD-10-CM | POA: Diagnosis not present

## 2020-08-05 ENCOUNTER — Ambulatory Visit (INDEPENDENT_AMBULATORY_CARE_PROVIDER_SITE_OTHER): Payer: Medicare HMO | Admitting: Family Medicine

## 2020-08-05 ENCOUNTER — Encounter: Payer: Self-pay | Admitting: Family Medicine

## 2020-08-05 ENCOUNTER — Other Ambulatory Visit: Payer: Self-pay

## 2020-08-05 VITALS — BP 112/60 | HR 64 | Ht 63.0 in | Wt 222.0 lb

## 2020-08-05 DIAGNOSIS — F3341 Major depressive disorder, recurrent, in partial remission: Secondary | ICD-10-CM

## 2020-08-05 DIAGNOSIS — F5101 Primary insomnia: Secondary | ICD-10-CM

## 2020-08-05 DIAGNOSIS — E790 Hyperuricemia without signs of inflammatory arthritis and tophaceous disease: Secondary | ICD-10-CM | POA: Diagnosis not present

## 2020-08-05 DIAGNOSIS — K219 Gastro-esophageal reflux disease without esophagitis: Secondary | ICD-10-CM | POA: Diagnosis not present

## 2020-08-05 DIAGNOSIS — J4520 Mild intermittent asthma, uncomplicated: Secondary | ICD-10-CM | POA: Diagnosis not present

## 2020-08-05 DIAGNOSIS — Z1231 Encounter for screening mammogram for malignant neoplasm of breast: Secondary | ICD-10-CM | POA: Diagnosis not present

## 2020-08-05 DIAGNOSIS — Z90710 Acquired absence of both cervix and uterus: Secondary | ICD-10-CM | POA: Insufficient documentation

## 2020-08-05 DIAGNOSIS — N644 Mastodynia: Secondary | ICD-10-CM | POA: Diagnosis not present

## 2020-08-05 DIAGNOSIS — E7849 Other hyperlipidemia: Secondary | ICD-10-CM | POA: Diagnosis not present

## 2020-08-05 MED ORDER — ALLOPURINOL 100 MG PO TABS: ORAL_TABLET | ORAL | 1 refills | Status: DC

## 2020-08-05 MED ORDER — ALBUTEROL SULFATE HFA 108 (90 BASE) MCG/ACT IN AERS: 2.0000 | INHALATION_SPRAY | Freq: Four times a day (QID) | RESPIRATORY_TRACT | 1 refills | Status: DC | PRN

## 2020-08-05 MED ORDER — TRAZODONE HCL 50 MG PO TABS: ORAL_TABLET | ORAL | 1 refills | Status: DC

## 2020-08-05 MED ORDER — LOVASTATIN 40 MG PO TABS: ORAL_TABLET | ORAL | 1 refills | Status: DC

## 2020-08-05 MED ORDER — SERTRALINE HCL 50 MG PO TABS: ORAL_TABLET | ORAL | 1 refills | Status: DC

## 2020-08-05 MED ORDER — OMEPRAZOLE 20 MG PO CPDR: DELAYED_RELEASE_CAPSULE | ORAL | 1 refills | Status: DC

## 2020-08-05 NOTE — Progress Notes (Signed)
Date:  08/05/2020   Name:  Kirsten Snyder   DOB:  [birth year]   MRN:  [id number]   Chief Complaint: Depression, Gout, Anemia, Hypertension, Hyperlipidemia, Asthma, and Gastroesophageal Reflux  Depression        This is a chronic problem.  The current episode started more than 1 year ago.   The problem has been gradually improving since onset.  Associated symptoms include fatigue, helplessness and sad.  Associated symptoms include no decreased concentration, no hopelessness, does not have insomnia, not irritable, no restlessness, no decreased interest, no appetite change, no body aches, no myalgias, no headaches, no indigestion and no suicidal ideas.  Past treatments include SSRIs - Selective serotonin reuptake inhibitors.  Previous treatment provided mild relief.   Pertinent negatives include no hypothyroidism. Anemia Presents for follow-up visit. There has been no abdominal pain, anorexia, bruising/bleeding easily, confusion, fever, leg swelling, light-headedness, malaise/fatigue, pallor, palpitations, paresthesias or weight loss. Signs of blood loss that are not present include hematemesis, hematochezia, melena, menorrhagia and vaginal bleeding. There is no history of chronic renal disease or hypothyroidism.  Hypertension This is a chronic problem. The current episode started more than 1 year ago. The problem has been gradually improving since onset. The problem is controlled. Pertinent negatives include no chest pain, headaches, malaise/fatigue, palpitations or shortness of breath. There is no history of chronic renal disease.  Hyperlipidemia This is a chronic problem. The current episode started more than 1 year ago. The problem is controlled. Recent lipid tests were reviewed and are normal. She has no history of chronic renal disease, diabetes, hypothyroidism, liver disease, obesity or nephrotic syndrome. Pertinent negatives include no chest pain, focal sensory loss, focal weakness, leg pain,  myalgias or shortness of breath. There are no compliance problems.   Asthma There is no chest tightness, cough, difficulty breathing, frequent throat clearing, hoarse voice, shortness of breath or wheezing. This is a chronic problem. The current episode started more than 1 year ago. Pertinent negatives include no appetite change, chest pain, ear pain, fever, headaches, heartburn, malaise/fatigue, myalgias, rhinorrhea, sneezing, sore throat or weight loss. Her symptoms are aggravated by pollen and change in weather. Her symptoms are alleviated by beta-agonist. She reports moderate improvement on treatment. Her past medical history is significant for asthma.  Gastroesophageal Reflux She reports no abdominal pain, no chest pain, no coughing, no heartburn, no hoarse voice, no nausea, no sore throat or no wheezing. This is a chronic problem. The current episode started more than 1 year ago. The problem has been gradually improving. The symptoms are aggravated by certain foods. Associated symptoms include fatigue. Pertinent negatives include no melena or weight loss.  Chest Pain  Chronicity: right and left breast. Pain location: breasts. Pertinent negatives include no abdominal pain, back pain, cough, dizziness, fever, headaches, leg pain, malaise/fatigue, nausea, numbness, palpitations, shortness of breath, vomiting or weakness. She has tried nothing for the symptoms.  Her past medical history is significant for hyperlipidemia and hypertension.  Pertinent negatives for past medical history include no diabetes.    Lab Results  Component Value Date   CREATININE 0.80 03/01/2020   BUN 16 03/01/2020   NA 141 03/01/2020   K 4.5 03/01/2020   CL 103 03/01/2020   CO2 29 03/01/2020   Lab Results  Component Value Date   CHOL 219 (H) 02/23/2020   HDL 41 02/23/2020   LDLCALC 141 (H) 02/23/2020   TRIG 207 (H) 02/23/2020   CHOLHDL 4.8 (H) 11/02/2017   Lab  Results  Component Value Date   TSH 2.39 09/15/2012    No results found for: HGBA1C Lab Results  Component Value Date   WBC 5.3 03/01/2020   HGB 11.0 (L) 03/01/2020   HCT 33.0 (L) 03/01/2020   MCV 94.6 03/01/2020   PLT 224 03/01/2020   Lab Results  Component Value Date   ALT 9 03/01/2020   AST 14 (L) 03/01/2020   ALKPHOS 62 03/01/2020   BILITOT 0.9 03/01/2020     Review of Systems  Constitutional: Positive for fatigue. Negative for appetite change, chills, fever, malaise/fatigue, unexpected weight change and weight loss.  HENT: Negative for congestion, ear discharge, ear pain, hoarse voice, rhinorrhea, sinus pressure, sneezing and sore throat.   Eyes: Negative for photophobia, pain, discharge, redness and itching.  Respiratory: Negative for cough, shortness of breath, wheezing and stridor.   Cardiovascular: Negative for chest pain and palpitations.  Gastrointestinal: Negative for abdominal pain, anorexia, blood in stool, constipation, diarrhea, heartburn, hematemesis, hematochezia, melena, nausea and vomiting.  Endocrine: Negative for cold intolerance, heat intolerance, polydipsia, polyphagia and polyuria.  Genitourinary: Negative for dysuria, flank pain, frequency, hematuria, menorrhagia, menstrual problem, pelvic pain, urgency, vaginal bleeding and vaginal discharge.  Musculoskeletal: Negative for arthralgias, back pain and myalgias.  Skin: Negative for pallor and rash.  Allergic/Immunologic: Negative for environmental allergies and food allergies.  Neurological: Negative for dizziness, focal weakness, weakness, light-headedness, numbness, headaches and paresthesias.  Hematological: Negative for adenopathy. Does not bruise/bleed easily.  Psychiatric/Behavioral: Positive for depression. Negative for confusion, decreased concentration, dysphoric mood and suicidal ideas. The patient is not nervous/anxious and does not have insomnia.     Patient Active Problem List   Diagnosis Date Noted  . H/O total hysterectomy 08/05/2020  . UTI  (urinary tract infection) 02/27/2020  . Fall at home, initial encounter 02/27/2020  . S/P resection of meningioma 10/31/2019  . Pulmonary emboli ([place]) 10/31/2019  . Sepsis secondary to UTI ([place]) 10/01/2019  . Sepsis ([place]) 10/01/2019  . Hypokalemia 10/01/2019  . Urinary urgency 09/15/2019  . Atherosclerosis of abdominal aorta ([place]) 10/12/2018  . SOBOE (shortness of breath on exertion) 10/12/2018  . Osteopenia of neck of right femur 06/29/2018  . Breast cancer, right ([place]) 06/17/2016  . Essential hypertension 12/13/2015  . Breast abscess of female 11/07/2015  . Familial multiple lipoprotein-type hyperlipidemia 10/25/2014  . Clinical depression 10/25/2014  . Acid reflux 10/25/2014  . Acute gastrointestinal bleeding 10/25/2014  . Aggrieved 10/25/2014  . Airway hyperreactivity 10/25/2014  . Arthritis of knee 10/25/2014  . H/O: gout 10/25/2014  . Adiposity 10/25/2014  . Arthritis, degenerative 10/25/2014  . H/O adenomatous polyp of colon 03/07/2014    Allergies  Allergen Reactions  . Sulfa Antibiotics     Past Surgical History:  Procedure Laterality Date  . BREAST BIOPSY Bilateral    negative  . BREAST EXCISIONAL BIOPSY Right    right positive 04/2010  . BREAST LUMPECTOMY Right    removed a cancerous lump  . VAGINAL HYSTERECTOMY      Social History   Tobacco Use  . Smoking status: Former Smoker    Packs/day: 0.25    Years: 2.00    Pack years: 0.50    Types: Cigarettes  . Smokeless tobacco: Never Used  . Tobacco comment: smoking cessation materials not required  Vaping Use  . Vaping Use: Never used  Substance Use Topics  . Alcohol use: No    Alcohol/week: 0.0 standard drinks  . Drug use: No     Medication list  has been reviewed and updated.  Current Meds  Medication Sig  . albuterol (VENTOLIN HFA) 108 (90 Base) MCG/ACT inhaler Inhale 2 puffs into the lungs every 6 (six) hours as needed for wheezing or shortness of breath.  . allopurinol (ZYLOPRIM) 100 MG  tablet TAKE 1 TABLET EVERY DAY  . calcium carbonate (TUMS - DOSED IN MG ELEMENTAL CALCIUM) 500 MG chewable tablet Chew 1 tablet by mouth every 8 (eight) hours as needed for indigestion.  . cetirizine (ZYRTEC) 10 MG tablet Take 10 mg by mouth daily. For allergies.  Marland Kitchen ELIQUIS 5 MG TABS tablet TAKE (1) TABLET BY MOUTH TWICE DAILY  . ferrous sulfate 325 (65 FE) MG tablet Take 325 mg by mouth daily with breakfast.  . Fluticasone-Umeclidin-Vilant (TRELEGY ELLIPTA) 100-62.5-25 MCG/INH AEPB Inhale 1 puff into the lungs daily.  Marland Kitchen lovastatin (MEVACOR) 40 MG tablet TAKE 1 TABLET EVERY DAY AT BEDTIME  . meclizine (ANTIVERT) 12.5 MG tablet Take 1 tablet (12.5 mg total) by mouth 3 (three) times daily as needed for dizziness.  . mupirocin ointment (BACTROBAN) 2 % Apply 1 application topically 2 (two) times daily.  Marland Kitchen nystatin cream (MYCOSTATIN) Apply 1 application topically 2 (two) times daily.  Marland Kitchen omeprazole (PRILOSEC) 20 MG capsule TAKE (1) CAPSULE BY MOUTH EVERY DAY  . polyethylene glycol (MIRALAX / GLYCOLAX) 17 g packet Take 17 g by mouth daily as needed.  . sertraline (ZOLOFT) 50 MG tablet TAKE (1) TABLET BY MOUTH EVERY DAY  . traZODone (DESYREL) 50 MG tablet TAKE 1 TABLET EVERY DAY AT BEDTIME    PHQ 2/9 Scores 08/05/2020 05/20/2020 02/23/2020 12/21/2019  PHQ - 2 Score 3 2 2 2   PHQ- 9 Score 4 5 9 5     GAD 7 : Generalized Anxiety Score 02/23/2020 12/21/2019 11/14/2019 09/14/2019  Nervous, Anxious, on Edge 0 3 0 1  Control/stop worrying 0 3 0 1  Worry too much - different things 0 3 0 1  Trouble relaxing 0 1 0 1  Restless 0 0 0 0  Easily annoyed or irritable 0 0 0 0  Afraid - awful might happen 0 0 0 0  Total GAD 7 Score 0 10 0 4  Anxiety Difficulty Not difficult at all Not difficult at all Not difficult at all Not difficult at all    BP Readings from Last 3 Encounters:  08/05/20 112/60  04/09/20 130/80  03/01/20 133/65    Physical Exam Vitals and nursing note reviewed.  Constitutional:       General: She is not irritable.    Appearance: She is well-developed.  HENT:     Head: Normocephalic.     Right Ear: Tympanic membrane, ear canal and external ear normal.     Left Ear: Tympanic membrane, ear canal and external ear normal.     Nose: Nose normal.  Eyes:     General: Lids are everted, no foreign bodies appreciated. No scleral icterus.       Left eye: No foreign body or hordeolum.     Conjunctiva/sclera: Conjunctivae normal.     Right eye: Right conjunctiva is not injected.     Left eye: Left conjunctiva is not injected.     Pupils: Pupils are equal, round, and reactive to light.  Neck:     Thyroid: No thyromegaly.     Vascular: No JVD.     Trachea: No tracheal deviation.  Cardiovascular:     Rate and Rhythm: Normal rate and regular rhythm.     Heart sounds:  Normal heart sounds. No murmur heard. No friction rub. No gallop.   Pulmonary:     Effort: Pulmonary effort is normal. No respiratory distress.     Breath sounds: Normal breath sounds. No wheezing, rhonchi or rales.  Chest:  Breasts:     Right: Normal. No swelling, bleeding, inverted nipple, mass, nipple discharge, skin change, tenderness, axillary adenopathy or supraclavicular adenopathy.     Left: Tenderness present. No swelling, bleeding, inverted nipple, mass, nipple discharge, skin change, axillary adenopathy or supraclavicular adenopathy.    Abdominal:     General: Bowel sounds are normal.     Palpations: Abdomen is soft. There is no mass.     Tenderness: There is no abdominal tenderness. There is no guarding or rebound.  Musculoskeletal:        General: No tenderness. Normal range of motion.     Cervical back: Normal range of motion and neck supple.  Lymphadenopathy:     Cervical: No cervical adenopathy.     Upper Body:     Right upper body: No supraclavicular or axillary adenopathy.     Left upper body: No supraclavicular or axillary adenopathy.  Skin:    General: Skin is warm.     Findings: No  rash.  Neurological:     Mental Status: She is alert and oriented to person, place, and time.     Cranial Nerves: No cranial nerve deficit.     Deep Tendon Reflexes: Reflexes normal.  Psychiatric:        Mood and Affect: Mood is not anxious or depressed.     Wt Readings from Last 3 Encounters:  08/05/20 222 lb (100.7 kg)  04/09/20 223 lb (101.2 kg)  02/27/20 230 lb (104.3 kg)    BP 112/60   Pulse 64   Ht 5\' 3"  (1.6 m)   Wt 222 lb (100.7 kg)   BMI 39.33 kg/m   Assessment and Plan:  1. Breast pain, left New onset.  Episodic.  Stable.  Patient has tenderness of the left breast without palpable mass.  There is past medical history of abnormal mammograms for which we will follow up with the below bilateral mammograms and ultrasound of the left. - MM DIAG BREAST TOMO BILATERAL; Future - US BREAST LTD UNI LEFT INC AXILLA; Future  2. Familial multiple lipoprotein-type hyperlipidemia Chronic.  Controlled.  Stable.  Continue Mevacor 40 mg once a day. - lovastatin (MEVACOR) 40 MG tablet; TAKE 1 TABLET EVERY DAY AT BEDTIME  Dispense: 90 tablet; Refill: 1  3. Hyperuricemia Chronic.  Controlled.  Stable.  Continue allopurinol 100 mg once a day. - allopurinol (ZYLOPRIM) 100 MG tablet; TAKE 1 TABLET EVERY DAY  Dispense: 90 tablet; Refill: 1  4. Mild intermittent asthma without complication Chronic.  Controlled.  Stable.  1 to 2 puffs every 6 hours for wheezing or shortness of breath. - albuterol (VENTOLIN HFA) 108 (90 Base) MCG/ACT inhaler; Inhale 2 puffs into the lungs every 6 (six) hours as needed for wheezing or shortness of breath.  Dispense: 18 g; Refill: 1  5. Gastroesophageal reflux disease, unspecified whether esophagitis present Chronic.  Controlled.  Stable.  Continue omeprazole 20 mg once a day. - omeprazole (PRILOSEC) 20 MG capsule; One a day  Dispense: 90 capsule; Refill: 1  6. Recurrent major depressive disorder, in partial remission (HCC) Chronic.  Controlled.  Stable.   Sertraline 50 mg will be continued.  PHQ is noted to be 4 with a gad score 0. - sertraline (ZOLOFT) 50  MG tablet; One a day  Dispense: 90 tablet; Refill: 1  7. Primary insomnia .  Controlled.  Stable.  Continue trazodone 50 mg once a day. - traZODone (DESYREL) 50 MG tablet; TAKE 1 TABLET EVERY DAY AT BEDTIME  Dispense: 90 tablet; Refill: 1  8. H/O total hysterectomy Patient has history of total hysterectomy not requiring [place] to continue to do cervical Pap smears.  9. Breast cancer screening by mammogram Breast cancer screening by mammogram discussed and ordered. - MM 3D SCREEN BREAST BILATERAL; Future

## 2020-08-07 ENCOUNTER — Other Ambulatory Visit: Payer: Self-pay

## 2020-08-07 ENCOUNTER — Ambulatory Visit
Admission: RE | Admit: 2020-08-07 | Discharge: 2020-08-07 | Disposition: A | Discharge status: Home / Self Care | Payer: Medicare HMO | Source: Ambulatory Visit | Attending: Family Medicine | Admitting: Family Medicine

## 2020-08-07 DIAGNOSIS — Z1231 Encounter for screening mammogram for malignant neoplasm of breast: Secondary | ICD-10-CM | POA: Insufficient documentation

## 2020-08-27 DIAGNOSIS — R262 Difficulty in walking, not elsewhere classified: Secondary | ICD-10-CM | POA: Diagnosis not present

## 2020-08-27 DIAGNOSIS — M6281 Muscle weakness (generalized): Secondary | ICD-10-CM | POA: Diagnosis not present

## 2020-09-27 DIAGNOSIS — M6281 Muscle weakness (generalized): Secondary | ICD-10-CM | POA: Diagnosis not present

## 2020-09-27 DIAGNOSIS — R262 Difficulty in walking, not elsewhere classified: Secondary | ICD-10-CM | POA: Diagnosis not present

## 2020-10-01 ENCOUNTER — Ambulatory Visit (INDEPENDENT_AMBULATORY_CARE_PROVIDER_SITE_OTHER): Payer: Medicare HMO | Admitting: Family Medicine

## 2020-10-01 ENCOUNTER — Other Ambulatory Visit: Payer: Self-pay

## 2020-10-01 ENCOUNTER — Encounter: Payer: Self-pay | Admitting: Family Medicine

## 2020-10-01 VITALS — BP 104/62 | HR 60 | Temp 98.0°F | Ht 63.0 in | Wt 226.0 lb

## 2020-10-01 DIAGNOSIS — J4520 Mild intermittent asthma, uncomplicated: Secondary | ICD-10-CM | POA: Diagnosis not present

## 2020-10-01 MED ORDER — ALBUTEROL SULFATE HFA 108 (90 BASE) MCG/ACT IN AERS: 2.0000 | INHALATION_SPRAY | Freq: Four times a day (QID) | RESPIRATORY_TRACT | 1 refills | Status: DC | PRN

## 2020-10-01 MED ORDER — MONTELUKAST SODIUM 10 MG PO TABS: 10.0000 mg | ORAL_TABLET | Freq: Every day | ORAL | 3 refills | Status: DC

## 2020-10-01 NOTE — Progress Notes (Signed)
Date:  10/01/2020   Name:  Kirsten Snyder   DOB:  [birth year]   MRN:  [id number]   Chief Complaint: Cough (Coughing- taking claritin D but not helping. )  Cough This is a new problem. The current episode started in the past 7 days. The problem has been unchanged. The problem occurs constantly. The cough is Non-productive. Associated symptoms include chest pain, chills, ear congestion, ear pain, headaches, nasal congestion, postnasal drip, a sore throat and wheezing. Pertinent negatives include no fever, heartburn, hemoptysis, myalgias, rash, rhinorrhea, shortness of breath, sweats or weight loss. Exacerbated by: supine. She has tried nothing for the symptoms. There is no history of environmental allergies.   Lab Results  Component Value Date   CREATININE 0.80 03/01/2020   BUN 16 03/01/2020   NA 141 03/01/2020   K 4.5 03/01/2020   CL 103 03/01/2020   CO2 29 03/01/2020   Lab Results  Component Value Date   CHOL 219 (H) 02/23/2020   HDL 41 02/23/2020   LDLCALC 141 (H) 02/23/2020   TRIG 207 (H) 02/23/2020   CHOLHDL 4.8 (H) 11/02/2017   Lab Results  Component Value Date   TSH 2.39 09/15/2012   No results found for: HGBA1C Lab Results  Component Value Date   WBC 5.3 03/01/2020   HGB 11.0 (L) 03/01/2020   HCT 33.0 (L) 03/01/2020   MCV 94.6 03/01/2020   PLT 224 03/01/2020   Lab Results  Component Value Date   ALT 9 03/01/2020   AST 14 (L) 03/01/2020   ALKPHOS 62 03/01/2020   BILITOT 0.9 03/01/2020     Review of Systems  Constitutional:  Positive for chills. Negative for fever and weight loss.  HENT:  Positive for ear pain, postnasal drip and sore throat. Negative for drooling, ear discharge and rhinorrhea.   Respiratory:  Positive for cough and wheezing. Negative for hemoptysis and shortness of breath.   Cardiovascular:  Positive for chest pain. Negative for palpitations and leg swelling.  Gastrointestinal:  Negative for abdominal pain, blood in stool, constipation,  diarrhea, heartburn and nausea.  Endocrine: Negative for polydipsia.  Genitourinary:  Negative for dysuria, frequency, hematuria and urgency.  Musculoskeletal:  Negative for back pain, myalgias and neck pain.  Skin:  Negative for rash.  Allergic/Immunologic: Negative for environmental allergies.  Neurological:  Positive for headaches. Negative for dizziness.  Hematological:  Does not bruise/bleed easily.  Psychiatric/Behavioral:  Negative for suicidal ideas. The patient is not nervous/anxious.    Patient Active Problem List   Diagnosis Date Noted   H/O total hysterectomy 08/05/2020   UTI (urinary tract infection) 02/27/2020   Fall at home, initial encounter 02/27/2020   S/P resection of meningioma 10/31/2019   Pulmonary emboli ([place]) 10/31/2019   Sepsis secondary to UTI ([place]) 10/01/2019   Sepsis ([place]) 10/01/2019   Hypokalemia 10/01/2019   Urinary urgency 09/15/2019   Atherosclerosis of abdominal aorta ([place]) 10/12/2018   SOBOE (shortness of breath on exertion) 10/12/2018   Osteopenia of neck of right femur 06/29/2018   Breast cancer, right ([place]) 06/17/2016   Essential hypertension 12/13/2015   Breast abscess of female 11/07/2015   Familial multiple lipoprotein-type hyperlipidemia 10/25/2014   Clinical depression 10/25/2014   Acid reflux 10/25/2014   Acute gastrointestinal bleeding 10/25/2014   Aggrieved 10/25/2014   Airway hyperreactivity 10/25/2014   Arthritis of knee 10/25/2014   H/O: gout 10/25/2014   Adiposity 10/25/2014   Arthritis, degenerative 10/25/2014   H/O adenomatous polyp of colon 03/07/2014  Allergies  Allergen Reactions   Sulfa Antibiotics     Past Surgical History:  Procedure Laterality Date   BREAST BIOPSY Bilateral    negative   BREAST EXCISIONAL BIOPSY Right    right positive 04/2010   BREAST LUMPECTOMY Right    removed a cancerous lump   VAGINAL HYSTERECTOMY      Social History   Tobacco Use   Smoking status: Former    Packs/day: 0.25     Years: 2.00    Pack years: 0.50    Types: Cigarettes   Smokeless tobacco: Never   Tobacco comments:    smoking cessation materials not required  Vaping Use   Vaping Use: Never used  Substance Use Topics   Alcohol use: No    Alcohol/week: 0.0 standard drinks   Drug use: No     Medication list has been reviewed and updated.  Current Meds  Medication Sig   albuterol (VENTOLIN HFA) 108 (90 Base) MCG/ACT inhaler Inhale 2 puffs into the lungs every 6 (six) hours as needed for wheezing or shortness of breath.   allopurinol (ZYLOPRIM) 100 MG tablet TAKE 1 TABLET EVERY DAY   calcium carbonate (TUMS - DOSED IN MG ELEMENTAL CALCIUM) 500 MG chewable tablet Chew 1 tablet by mouth every 8 (eight) hours as needed for indigestion.   cetirizine (ZYRTEC) 10 MG tablet Take 10 mg by mouth daily. For allergies.   ELIQUIS 5 MG TABS tablet TAKE (1) TABLET BY MOUTH TWICE DAILY   ferrous sulfate 325 (65 FE) MG tablet Take 325 mg by mouth daily with breakfast.   Fluticasone-Umeclidin-Vilant (TRELEGY ELLIPTA) 100-62.5-25 MCG/INH AEPB Inhale 1 puff into the lungs daily.   lovastatin (MEVACOR) 40 MG tablet TAKE 1 TABLET EVERY DAY AT BEDTIME   meclizine (ANTIVERT) 12.5 MG tablet Take 1 tablet (12.5 mg total) by mouth 3 (three) times daily as needed for dizziness.   mupirocin ointment (BACTROBAN) 2 % Apply 1 application topically 2 (two) times daily.   nystatin cream (MYCOSTATIN) Apply 1 application topically 2 (two) times daily.   omeprazole (PRILOSEC) 20 MG capsule One a day   polyethylene glycol (MIRALAX / GLYCOLAX) 17 g packet Take 17 g by mouth daily as needed.   sertraline (ZOLOFT) 50 MG tablet One a day   traZODone (DESYREL) 50 MG tablet TAKE 1 TABLET EVERY DAY AT BEDTIME    PHQ 2/9 Scores 10/01/2020 08/05/2020 05/20/2020 02/23/2020  PHQ - 2 Score 2 3 2 2   PHQ- 9 Score 3 4 5 9     GAD 7 : Generalized Anxiety Score 10/01/2020 02/23/2020 12/21/2019 11/14/2019  Nervous, Anxious, on Edge 0 0 3 0  Control/stop  worrying 0 0 3 0  Worry too much - different things 0 0 3 0  Trouble relaxing 0 0 1 0  Restless 0 0 0 0  Easily annoyed or irritable 0 0 0 0  Afraid - awful might happen 0 0 0 0  Total GAD 7 Score 0 0 10 0  Anxiety Difficulty - Not difficult at all Not difficult at all Not difficult at all    BP Readings from Last 3 Encounters:  10/01/20 104/62  08/05/20 112/60  04/09/20 130/80    Physical Exam Vitals reviewed.  Constitutional:      General: She is not in acute distress.    Appearance: She is not diaphoretic.  HENT:     Head: Normocephalic and atraumatic.     Right Ear: Tympanic membrane, ear canal and external ear normal.  There is no impacted cerumen.     Left Ear: Tympanic membrane, ear canal and external ear normal. There is no impacted cerumen.     Nose: Nose normal. No congestion or rhinorrhea.     Mouth/Throat:     Mouth: Mucous membranes are moist.  Eyes:     General:        Right eye: No discharge.        Left eye: No discharge.     Conjunctiva/sclera: Conjunctivae normal.     Pupils: Pupils are equal, round, and reactive to light.  Neck:     Thyroid: No thyromegaly.     Vascular: No JVD.  Cardiovascular:     Rate and Rhythm: Normal rate and regular rhythm.     Heart sounds: Normal heart sounds. No murmur heard.   No friction rub. No gallop.  Pulmonary:     Effort: Pulmonary effort is normal.     Breath sounds: Normal breath sounds. No wheezing, rhonchi or rales.     Comments: Increased e/i Abdominal:     General: Bowel sounds are normal.     Palpations: Abdomen is soft. There is no mass.     Tenderness: There is no abdominal tenderness. There is no guarding.  Musculoskeletal:        General: Normal range of motion.     Cervical back: Normal range of motion and neck supple.  Lymphadenopathy:     Cervical: No cervical adenopathy.  Skin:    General: Skin is warm and dry.  Neurological:     Mental Status: She is alert.     Deep Tendon Reflexes: Reflexes  are normal and symmetric.    Wt Readings from Last 3 Encounters:  10/01/20 226 lb (102.5 kg)  08/05/20 222 lb (100.7 kg)  04/09/20 223 lb (101.2 kg)    BP 104/62   Pulse 60   Temp 98 F (36.7 C) (Oral)   Ht 5\' 3"  (1.6 m)   Wt 226 lb (102.5 kg)   SpO2 97%   BMI 40.03 kg/m   Assessment and Plan: 1. Mild intermittent asthma without complication Chronic.  Persistent at this time.  With intermittent cough.  Pulse ox noted to be 97%.  Luminary exam notes no rales or rhonchi wheezes or rub but there is an increase in expiratory to inspiratory ratio.  I noticed that she is not taking her inhaler and we have asked her to resume this with an AeroChamber and we have called [facility] to facilitate this in addition we will put her on Singulair 10 mg once a day.  I have instructed for her to use Mucinex DM for the cough. - albuterol (VENTOLIN HFA) 108 (90 Base) MCG/ACT inhaler; Inhale 2 puffs into the lungs every 6 (six) hours as needed for wheezing or shortness of breath. Include aerochamber  Dispense: 18 g; Refill: 1 - montelukast (SINGULAIR) 10 MG tablet; Take 1 tablet (10 mg total) by mouth at bedtime.  Dispense: 30 tablet; Refill: 3

## 2020-10-27 DIAGNOSIS — R262 Difficulty in walking, not elsewhere classified: Secondary | ICD-10-CM | POA: Diagnosis not present

## 2020-10-27 DIAGNOSIS — M6281 Muscle weakness (generalized): Secondary | ICD-10-CM | POA: Diagnosis not present

## 2020-10-28 ENCOUNTER — Other Ambulatory Visit: Payer: Self-pay | Admitting: Family Medicine

## 2020-11-27 DIAGNOSIS — M6281 Muscle weakness (generalized): Secondary | ICD-10-CM | POA: Diagnosis not present

## 2020-11-27 DIAGNOSIS — R262 Difficulty in walking, not elsewhere classified: Secondary | ICD-10-CM | POA: Diagnosis not present

## 2020-12-07 ENCOUNTER — Other Ambulatory Visit: Payer: Self-pay | Admitting: Family Medicine

## 2020-12-07 DIAGNOSIS — Z86711 Personal history of pulmonary embolism: Secondary | ICD-10-CM

## 2020-12-07 NOTE — Telephone Encounter (Signed)
Requested Prescriptions  Pending Prescriptions Disp Refills  . ELIQUIS 5 MG TABS tablet [Pharmacy Med Name: ELIQUIS 5 MG TAB] 180 tablet 0    Sig: TAKE (1) TABLET BY MOUTH TWICE DAILY     Hematology:  Anticoagulants Failed - 12/07/2020 12:21 PM      Failed - HGB in normal range and within 360 days    Hemoglobin  Date Value Ref Range Status  03/01/2020 11.0 (L) 12.0 - 15.0 g/dL Final   HGB  Date Value Ref Range Status  10/19/2013 12.8 12.0 - 16.0 g/dL Final         Failed - HCT in normal range and within 360 days    HCT  Date Value Ref Range Status  03/01/2020 33.0 (L) 36.0 - 46.0 % Final  10/19/2013 38.1 35.0 - 47.0 % Final         Passed - PLT in normal range and within 360 days    Platelets  Date Value Ref Range Status  03/01/2020 224 150 - 400 K/uL Final   Platelet  Date Value Ref Range Status  10/19/2013 301 150 - 440 x10 3/mm  Final         Passed - Cr in normal range and within 360 days    Creatinine  Date Value Ref Range Status  10/19/2013 0.80 0.60 - 1.30 mg/dL Final   Creatinine, Ser  Date Value Ref Range Status  03/01/2020 0.80 0.44 - 1.00 mg/dL Final         Passed - Valid encounter within last 12 months    Recent Outpatient Visits          2 months ago Mild intermittent asthma without complication   [facility] [name], MD   4 months ago Breast pain, left   [facility] [name], MD   8 months ago Acute cystitis without hematuria   [facility] [name], MD   9 months ago Familial multiple lipoprotein-type hyperlipidemia   [facility] [name], MD   11 months ago [place], Deanna C, MD      Future Appointments            In 1 month [name], MD [facility], [facility]

## 2020-12-28 DIAGNOSIS — M6281 Muscle weakness (generalized): Secondary | ICD-10-CM | POA: Diagnosis not present

## 2020-12-28 DIAGNOSIS — R262 Difficulty in walking, not elsewhere classified: Secondary | ICD-10-CM | POA: Diagnosis not present

## 2021-01-14 ENCOUNTER — Ambulatory Visit (INDEPENDENT_AMBULATORY_CARE_PROVIDER_SITE_OTHER): Payer: Medicare HMO | Admitting: Family Medicine

## 2021-01-14 ENCOUNTER — Other Ambulatory Visit: Payer: Self-pay

## 2021-01-14 ENCOUNTER — Encounter: Payer: Self-pay | Admitting: Family Medicine

## 2021-01-14 VITALS — BP 104/68 | HR 94 | Ht 63.0 in | Wt 224.0 lb

## 2021-01-14 DIAGNOSIS — Z862 Personal history of diseases of the blood and blood-forming organs and certain disorders involving the immune mechanism: Secondary | ICD-10-CM

## 2021-01-14 DIAGNOSIS — F5101 Primary insomnia: Secondary | ICD-10-CM | POA: Diagnosis not present

## 2021-01-14 DIAGNOSIS — I10 Essential (primary) hypertension: Secondary | ICD-10-CM

## 2021-01-14 DIAGNOSIS — K219 Gastro-esophageal reflux disease without esophagitis: Secondary | ICD-10-CM | POA: Diagnosis not present

## 2021-01-14 DIAGNOSIS — Z23 Encounter for immunization: Secondary | ICD-10-CM

## 2021-01-14 DIAGNOSIS — E790 Hyperuricemia without signs of inflammatory arthritis and tophaceous disease: Secondary | ICD-10-CM

## 2021-01-14 DIAGNOSIS — J4520 Mild intermittent asthma, uncomplicated: Secondary | ICD-10-CM

## 2021-01-14 DIAGNOSIS — E876 Hypokalemia: Secondary | ICD-10-CM | POA: Diagnosis not present

## 2021-01-14 DIAGNOSIS — E7849 Other hyperlipidemia: Secondary | ICD-10-CM

## 2021-01-14 DIAGNOSIS — F3341 Major depressive disorder, recurrent, in partial remission: Secondary | ICD-10-CM

## 2021-01-14 MED ORDER — LOVASTATIN 40 MG PO TABS: ORAL_TABLET | ORAL | 1 refills | Status: DC

## 2021-01-14 MED ORDER — ALLOPURINOL 100 MG PO TABS: ORAL_TABLET | ORAL | 1 refills | Status: DC

## 2021-01-14 MED ORDER — TRAZODONE HCL 50 MG PO TABS: ORAL_TABLET | ORAL | 1 refills | Status: DC

## 2021-01-14 MED ORDER — SERTRALINE HCL 50 MG PO TABS: ORAL_TABLET | ORAL | 1 refills | Status: DC

## 2021-01-14 MED ORDER — ALBUTEROL SULFATE HFA 108 (90 BASE) MCG/ACT IN AERS: 2.0000 | INHALATION_SPRAY | Freq: Four times a day (QID) | RESPIRATORY_TRACT | 1 refills | Status: DC | PRN

## 2021-01-14 MED ORDER — LOSARTAN POTASSIUM 25 MG PO TABS: ORAL_TABLET | ORAL | 1 refills | Status: DC

## 2021-01-14 MED ORDER — MONTELUKAST SODIUM 10 MG PO TABS: 10.0000 mg | ORAL_TABLET | Freq: Every day | ORAL | 3 refills | Status: DC

## 2021-01-14 MED ORDER — OMEPRAZOLE 20 MG PO CPDR: DELAYED_RELEASE_CAPSULE | ORAL | 1 refills | Status: DC

## 2021-01-14 NOTE — Progress Notes (Signed)
Date:  01/14/2021   Name:  Kirsten Snyder   DOB:  Jul 19, 1933   MRN:  [id number]   Chief Complaint: Hyperlipidemia, Hypertension, Insomnia, Asthma, and Gout  Hyperlipidemia This is a chronic problem. The current episode started more than 1 year ago. The problem is controlled. Recent lipid tests were reviewed and are normal. She has no history of chronic renal disease, diabetes, hypothyroidism, liver disease, obesity or nephrotic syndrome. There are no known factors aggravating her hyperlipidemia. Pertinent negatives include no chest pain, focal sensory loss, focal weakness, leg pain, myalgias or shortness of breath. Current antihyperlipidemic treatment includes statins. The current treatment provides mild improvement of lipids. There are no compliance problems.   Hypertension This is a chronic problem. The current episode started more than 1 year ago. The problem has been gradually improving since onset. The problem is controlled. Pertinent negatives include no chest pain, headaches, neck pain, palpitations or shortness of breath. Past treatments include angiotensin blockers. The current treatment provides moderate improvement. There is no history of angina, kidney disease, CAD/MI, CVA, heart failure, left ventricular hypertrophy, PVD or retinopathy. There is no history of chronic renal disease, a hypertension causing med or renovascular disease.  Insomnia Primary symptoms: fragmented sleep, difficulty falling asleep.   The problem has been waxing and waning since onset. PMH includes: no hypertension.   Asthma There is no chest tightness, cough, shortness of breath or wheezing. This is a chronic problem. The problem occurs intermittently. The problem has been gradually improving. Pertinent negatives include no chest pain, ear pain, fever, headaches, myalgias or sore throat. Her past medical history is significant for asthma.   Lab Results  Component Value Date   CREATININE 0.80 03/01/2020   BUN  16 03/01/2020   NA 141 03/01/2020   K 4.5 03/01/2020   CL 103 03/01/2020   CO2 29 03/01/2020   Lab Results  Component Value Date   CHOL 219 (H) 02/23/2020   HDL 41 02/23/2020   LDLCALC 141 (H) 02/23/2020   TRIG 207 (H) 02/23/2020   CHOLHDL 4.8 (H) 11/02/2017   Lab Results  Component Value Date   TSH 2.39 09/15/2012   No results found for: HGBA1C Lab Results  Component Value Date   WBC 5.3 03/01/2020   HGB 11.0 (L) 03/01/2020   HCT 33.0 (L) 03/01/2020   MCV 94.6 03/01/2020   PLT 224 03/01/2020   Lab Results  Component Value Date   ALT 9 03/01/2020   AST 14 (L) 03/01/2020   ALKPHOS 62 03/01/2020   BILITOT 0.9 03/01/2020     Review of Systems  Constitutional:  Negative for chills and fever.  HENT:  Negative for drooling, ear discharge, ear pain and sore throat.   Respiratory:  Negative for cough, shortness of breath and wheezing.   Cardiovascular:  Negative for chest pain, palpitations and leg swelling.  Gastrointestinal:  Negative for abdominal pain, blood in stool, constipation, diarrhea and nausea.  Endocrine: Negative for polydipsia.  Genitourinary:  Negative for dysuria, frequency, hematuria and urgency.  Musculoskeletal:  Negative for back pain, myalgias and neck pain.  Skin:  Negative for rash.  Allergic/Immunologic: Negative for environmental allergies.  Neurological:  Negative for dizziness, focal weakness and headaches.  Hematological:  Does not bruise/bleed easily.  Psychiatric/Behavioral:  Negative for suicidal ideas. The patient has insomnia. The patient is not nervous/anxious.    Patient Active Problem List   Diagnosis Date Noted   H/O total hysterectomy 08/05/2020   UTI (urinary tract  infection) 02/27/2020   Fall at home, initial encounter 02/27/2020   S/P resection of meningioma 10/31/2019   Pulmonary emboli ([place]) 10/31/2019   Sepsis secondary to UTI ([place]) 10/01/2019   Sepsis ([place]) 10/01/2019   Hypokalemia 10/01/2019   Urinary urgency  09/15/2019   Atherosclerosis of abdominal aorta ([place]) 10/12/2018   SOBOE (shortness of breath on exertion) 10/12/2018   Osteopenia of neck of right femur 06/29/2018   Breast cancer, right ([place]) 06/17/2016   Essential hypertension 12/13/2015   Breast abscess of female 11/07/2015   Familial multiple lipoprotein-type hyperlipidemia 10/25/2014   Clinical depression 10/25/2014   Acid reflux 10/25/2014   Acute gastrointestinal bleeding 10/25/2014   Aggrieved 10/25/2014   Airway hyperreactivity 10/25/2014   Arthritis of knee 10/25/2014   H/O: gout 10/25/2014   Adiposity 10/25/2014   Arthritis, degenerative 10/25/2014   H/O adenomatous polyp of colon 03/07/2014    Allergies  Allergen Reactions   Sulfa Antibiotics     Past Surgical History:  Procedure Laterality Date   BREAST BIOPSY Bilateral    negative   BREAST EXCISIONAL BIOPSY Right    right positive 04/2010   BREAST LUMPECTOMY Right    removed a cancerous lump   VAGINAL HYSTERECTOMY      Social History   Tobacco Use   Smoking status: Former    Packs/day: 0.25    Years: 2.00    Pack years: 0.50    Types: Cigarettes   Smokeless tobacco: Never   Tobacco comments:    smoking cessation materials not required  Vaping Use   Vaping Use: Never used  Substance Use Topics   Alcohol use: No    Alcohol/week: 0.0 standard drinks   Drug use: No     Medication list has been reviewed and updated.  Current Meds  Medication Sig   albuterol (VENTOLIN HFA) 108 (90 Base) MCG/ACT inhaler Inhale 2 puffs into the lungs every 6 (six) hours as needed for wheezing or shortness of breath. Include aerochamber   allopurinol (ZYLOPRIM) 100 MG tablet TAKE 1 TABLET EVERY DAY   calcium carbonate (TUMS - DOSED IN MG ELEMENTAL CALCIUM) 500 MG chewable tablet Chew 1 tablet by mouth every 8 (eight) hours as needed for indigestion.   cetirizine (ZYRTEC) 10 MG tablet Take 10 mg by mouth daily. For allergies.   ELIQUIS 5 MG TABS tablet TAKE (1)  TABLET BY MOUTH TWICE DAILY   ferrous sulfate 325 (65 FE) MG tablet Take 325 mg by mouth daily with breakfast.   Fluticasone-Umeclidin-Vilant (TRELEGY ELLIPTA) 100-62.5-25 MCG/INH AEPB Inhale 1 puff into the lungs daily.   losartan (COZAAR) 25 MG tablet TAKE (1) TABLET BY MOUTH EVERY DAY   lovastatin (MEVACOR) 40 MG tablet TAKE 1 TABLET EVERY DAY AT BEDTIME   meclizine (ANTIVERT) 12.5 MG tablet Take 1 tablet (12.5 mg total) by mouth 3 (three) times daily as needed for dizziness.   montelukast (SINGULAIR) 10 MG tablet Take 1 tablet (10 mg total) by mouth at bedtime.   mupirocin ointment (BACTROBAN) 2 % Apply 1 application topically 2 (two) times daily.   nystatin cream (MYCOSTATIN) Apply 1 application topically 2 (two) times daily.   omeprazole (PRILOSEC) 20 MG capsule One a day   polyethylene glycol (MIRALAX / GLYCOLAX) 17 g packet Take 17 g by mouth daily as needed.   sertraline (ZOLOFT) 50 MG tablet One a day   traZODone (DESYREL) 50 MG tablet TAKE 1 TABLET EVERY DAY AT BEDTIME    PHQ 2/9 Scores 01/14/2021 10/01/2020 08/05/2020  05/20/2020  PHQ - 2 Score 6 2 3 2   PHQ- 9 Score 13 3 4 5     GAD 7 : Generalized Anxiety Score 10/01/2020 02/23/2020 12/21/2019 11/14/2019  Nervous, Anxious, on Edge 0 0 3 0  Control/stop worrying 0 0 3 0  Worry too much - different things 0 0 3 0  Trouble relaxing 0 0 1 0  Restless 0 0 0 0  Easily annoyed or irritable 0 0 0 0  Afraid - awful might happen 0 0 0 0  Total GAD 7 Score 0 0 10 0  Anxiety Difficulty - Not difficult at all Not difficult at all Not difficult at all    BP Readings from Last 3 Encounters:  01/14/21 104/68  10/01/20 104/62  08/05/20 112/60    Physical Exam Vitals and nursing note reviewed.  Constitutional:      Appearance: She is well-developed.  HENT:     Head: Normocephalic.     Right Ear: Tympanic membrane and external ear normal.     Left Ear: Tympanic membrane and external ear normal.     Nose: Nose normal.  Eyes:      General: Lids are everted, no foreign bodies appreciated. No scleral icterus.       Left eye: No foreign body or hordeolum.     Conjunctiva/sclera: Conjunctivae normal.     Right eye: Right conjunctiva is not injected.     Left eye: Left conjunctiva is not injected.     Pupils: Pupils are equal, round, and reactive to light.  Neck:     Thyroid: No thyromegaly.     Vascular: No JVD.     Trachea: No tracheal deviation.  Cardiovascular:     Rate and Rhythm: Normal rate and regular rhythm.     Heart sounds: Normal heart sounds. No murmur heard.   No friction rub. No gallop.  Pulmonary:     Effort: Pulmonary effort is normal. No respiratory distress.     Breath sounds: Normal breath sounds. No wheezing, rhonchi or rales.  Abdominal:     General: Bowel sounds are normal.     Palpations: Abdomen is soft. There is no mass.     Tenderness: There is no abdominal tenderness. There is no guarding or rebound.  Musculoskeletal:        General: No tenderness. Normal range of motion.     Cervical back: Normal range of motion and neck supple.  Lymphadenopathy:     Cervical: No cervical adenopathy.  Skin:    General: Skin is warm.     Findings: No rash.  Neurological:     Mental Status: She is alert and oriented to person, place, and time.     Cranial Nerves: No cranial nerve deficit.     Deep Tendon Reflexes: Reflexes normal.  Psychiatric:        Mood and Affect: Mood is not anxious or depressed.    Wt Readings from Last 3 Encounters:  01/14/21 224 lb (101.6 kg)  10/01/20 226 lb (102.5 kg)  08/05/20 222 lb (100.7 kg)    BP 104/68   Pulse 94   Ht 5\' 3"  (1.6 m)   Wt 224 lb (101.6 kg)   SpO2 96%   BMI 39.68 kg/m   Assessment and Plan:  1. Essential hypertension Chronic.  Controlled.  Stable.  Blood pressure today is 104/68.  Continue losartan 25 mg once a day.  Will check CMP for electrolytes and GFR. - Comprehensive Metabolic Panel (CMET)  2. Mild intermittent asthma without  complication Chronic.  Controlled.  Intermittent.  Uncomplicated.  Continue albuterol inhaler 2 puffs every 6 hours as needed wheezing as well as Singulair 10 mg once a day. - albuterol (VENTOLIN HFA) 108 (90 Base) MCG/ACT inhaler; Inhale 2 puffs into the lungs every 6 (six) hours as needed for wheezing or shortness of breath. Include aerochamber  Dispense: 18 g; Refill: 1 - montelukast (SINGULAIR) 10 MG tablet; Take 1 tablet (10 mg total) by mouth at bedtime.  Dispense: 30 tablet; Refill: 3  3. Hyperuricemia Chronic.  Controlled.  Stable.  Allopurinol 100 mg daily to control uric acid levels. - allopurinol (ZYLOPRIM) 100 MG tablet; TAKE 1 TABLET EVERY DAY  Dispense: 90 tablet; Refill: 1  4. Familial multiple lipoprotein-type hyperlipidemia Chronic.  Controlled.  Stable.  Continue lovastatin 40 mg once a day.  Will check lipid panel for current status. - lovastatin (MEVACOR) 40 MG tablet; TAKE 1 TABLET EVERY DAY AT BEDTIME  Dispense: 90 tablet; Refill: 1 - Lipid Panel With LDL/HDL Ratio  5. Gastroesophageal reflux disease, unspecified whether esophagitis present Chronic.  Controlled.  Stable.  Continue omeprazole 20 mg once a day. - omeprazole (PRILOSEC) 20 MG capsule; One a day  Dispense: 90 capsule; Refill: 1  6. Recurrent major depressive disorder, in partial remission (HCC) Chronic.  Controlled.  Stable.  PHQ is 13 Gad score is 13 although I do not think it is true the this high patient has retrograde herself as worse than she is feeling.  In the meantime we will continue sertraline 50 mg once a day. - sertraline (ZOLOFT) 50 MG tablet; One a day  Dispense: 90 tablet; Refill: 1  7. Primary insomnia Chronic.  Controlled.  Stable.  Continue trazodone 50 mg nightly as needed. - traZODone (DESYREL) 50 MG tablet; TAKE 1 TABLET EVERY DAY AT BEDTIME  Dispense: 90 tablet; Refill: 1  8. Need for immunization against influenza Discussed and administered. - Flu Vaccine QUAD High Dose(Fluad)  9.  Hypokalemia Patient with history of hypokalemia currently not on supplementation.  We will check current level of potassium with CMP. - Comprehensive Metabolic Panel (CMET)  10. History of anemia History of anemia we will note current status with Cbc - CBC w/Diff/Platelet

## 2021-01-15 LAB — COMPREHENSIVE METABOLIC PANEL
ALT: 12 IU/L (ref 0–32)
AST: 20 IU/L (ref 0–40)
Albumin/Globulin Ratio: 2.2 (ref 1.2–2.2)
Albumin: 4.2 g/dL (ref 3.6–4.6)
Alkaline Phosphatase: 65 IU/L (ref 44–121)
BUN/Creatinine Ratio: 16 (ref 12–28)
BUN: 15 mg/dL (ref 8–27)
Bilirubin Total: 0.9 mg/dL (ref 0.0–1.2)
CO2: 27 mmol/L (ref 20–29)
Calcium: 9.3 mg/dL (ref 8.7–10.3)
Chloride: 102 mmol/L (ref 96–106)
Creatinine, Ser: 0.93 mg/dL (ref 0.57–1.00)
Globulin, Total: 1.9 g/dL (ref 1.5–4.5)
Glucose: 114 mg/dL — ABNORMAL HIGH (ref 70–99)
Potassium: 4.3 mmol/L (ref 3.5–5.2)
Sodium: 144 mmol/L (ref 134–144)
Total Protein: 6.1 g/dL (ref 6.0–8.5)
eGFR: 60 mL/min/{1.73_m2} (ref >59)

## 2021-01-15 LAB — CBC WITH DIFFERENTIAL/PLATELET
Basophils Absolute: 0 10*3/uL (ref 0.0–0.2)
Basos: 1 %
EOS (ABSOLUTE): 0.2 10*3/uL (ref 0.0–0.4)
Eos: 4 %
Hematocrit: 38.7 % (ref 34.0–46.6)
Hemoglobin: 13.4 g/dL (ref 11.1–15.9)
Immature Grans (Abs): 0 10*3/uL (ref 0.0–0.1)
Immature Granulocytes: 0 %
Lymphocytes Absolute: 2 10*3/uL (ref 0.7–3.1)
Lymphs: 35 %
MCH: 31.2 pg (ref 26.6–33.0)
MCHC: 34.6 g/dL (ref 31.5–35.7)
MCV: 90 fL (ref 79–97)
Monocytes Absolute: 0.5 10*3/uL (ref 0.1–0.9)
Monocytes: 10 %
Neutrophils Absolute: 2.8 10*3/uL (ref 1.4–7.0)
Neutrophils: 50 %
Platelets: 299 10*3/uL (ref 150–450)
RBC: 4.29 x10E6/uL (ref 3.77–5.28)
RDW: 12.9 % (ref 11.7–15.4)
WBC: 5.6 10*3/uL (ref 3.4–10.8)

## 2021-01-15 LAB — LIPID PANEL WITH LDL/HDL RATIO
Cholesterol, Total: 221 mg/dL — ABNORMAL HIGH (ref 100–199)
HDL: 41 mg/dL (ref >39)
LDL Chol Calc (NIH): 143 mg/dL — ABNORMAL HIGH (ref 0–99)
LDL/HDL Ratio: 3.5 ratio — ABNORMAL HIGH (ref 0.0–3.2)
Triglycerides: 202 mg/dL — ABNORMAL HIGH (ref 0–149)
VLDL Cholesterol Cal: 37 mg/dL (ref 5–40)

## 2021-01-27 DIAGNOSIS — M6281 Muscle weakness (generalized): Secondary | ICD-10-CM | POA: Diagnosis not present

## 2021-01-27 DIAGNOSIS — R262 Difficulty in walking, not elsewhere classified: Secondary | ICD-10-CM | POA: Diagnosis not present

## 2021-01-31 ENCOUNTER — Other Ambulatory Visit: Payer: Self-pay

## 2021-01-31 ENCOUNTER — Ambulatory Visit (INDEPENDENT_AMBULATORY_CARE_PROVIDER_SITE_OTHER): Payer: Medicare HMO | Admitting: Family Medicine

## 2021-01-31 VITALS — BP 110/50 | HR 80 | Temp 99.6°F | Resp 16 | Ht 63.0 in | Wt 224.0 lb

## 2021-01-31 DIAGNOSIS — U071 COVID-19: Secondary | ICD-10-CM | POA: Diagnosis not present

## 2021-01-31 DIAGNOSIS — R0902 Hypoxemia: Secondary | ICD-10-CM | POA: Diagnosis not present

## 2021-01-31 DIAGNOSIS — J449 Chronic obstructive pulmonary disease, unspecified: Secondary | ICD-10-CM | POA: Diagnosis not present

## 2021-01-31 DIAGNOSIS — I10 Essential (primary) hypertension: Secondary | ICD-10-CM | POA: Diagnosis not present

## 2021-01-31 DIAGNOSIS — R0981 Nasal congestion: Secondary | ICD-10-CM | POA: Diagnosis not present

## 2021-01-31 DIAGNOSIS — R059 Cough, unspecified: Secondary | ICD-10-CM | POA: Diagnosis not present

## 2021-01-31 DIAGNOSIS — R509 Fever, unspecified: Secondary | ICD-10-CM

## 2021-01-31 DIAGNOSIS — R5383 Other fatigue: Secondary | ICD-10-CM | POA: Diagnosis not present

## 2021-01-31 DIAGNOSIS — E785 Hyperlipidemia, unspecified: Secondary | ICD-10-CM | POA: Diagnosis not present

## 2021-01-31 DIAGNOSIS — R051 Acute cough: Secondary | ICD-10-CM | POA: Diagnosis not present

## 2021-01-31 DIAGNOSIS — J3489 Other specified disorders of nose and nasal sinuses: Secondary | ICD-10-CM | POA: Diagnosis not present

## 2021-01-31 NOTE — Progress Notes (Signed)
Date:  01/31/2021   Name:  Kirsten Snyder   DOB:  07-Apr-1934   MRN:  [id number]   Chief Complaint: Cough  Cough This is a new problem. The current episode started in the past 7 days. The problem has been gradually worsening. The problem occurs hourly. The cough is Productive of sputum (thick). Associated symptoms include chills, a fever, headaches, myalgias, a sore throat, shortness of breath and sweats. Pertinent negatives include no chest pain, ear congestion, ear pain, heartburn, hemoptysis, nasal congestion, postnasal drip, rash, rhinorrhea, weight loss or wheezing. Nothing aggravates the symptoms. She has tried OTC cough suppressant for the symptoms. The treatment provided mild relief.   Lab Results  Component Value Date   CREATININE 0.93 01/14/2021   BUN 15 01/14/2021   NA 144 01/14/2021   K 4.3 01/14/2021   CL 102 01/14/2021   CO2 27 01/14/2021   Lab Results  Component Value Date   CHOL 221 (H) 01/14/2021   HDL 41 01/14/2021   LDLCALC 143 (H) 01/14/2021   TRIG 202 (H) 01/14/2021   CHOLHDL 4.8 (H) 11/02/2017   Lab Results  Component Value Date   TSH 2.39 09/15/2012   No results found for: HGBA1C Lab Results  Component Value Date   WBC 5.6 01/14/2021   HGB 13.4 01/14/2021   HCT 38.7 01/14/2021   MCV 90 01/14/2021   PLT 299 01/14/2021   Lab Results  Component Value Date   ALT 12 01/14/2021   AST 20 01/14/2021   ALKPHOS 65 01/14/2021   BILITOT 0.9 01/14/2021     Review of Systems  Constitutional:  Positive for chills and fever. Negative for weight loss.  HENT:  Positive for sore throat. Negative for ear pain, postnasal drip and rhinorrhea.   Respiratory:  Positive for cough and shortness of breath. Negative for hemoptysis and wheezing.   Cardiovascular:  Negative for chest pain.  Gastrointestinal:  Negative for heartburn.  Musculoskeletal:  Positive for myalgias.  Skin:  Negative for rash.  Neurological:  Positive for headaches.   Patient Active  Problem List   Diagnosis Date Noted   H/O total hysterectomy 08/05/2020   UTI (urinary tract infection) 02/27/2020   Fall at home, initial encounter 02/27/2020   S/P resection of meningioma 10/31/2019   Pulmonary emboli ([place]) 10/31/2019   Sepsis secondary to UTI ([place]) 10/01/2019   Sepsis ([place]) 10/01/2019   Hypokalemia 10/01/2019   Urinary urgency 09/15/2019   Atherosclerosis of abdominal aorta ([place]) 10/12/2018   SOBOE (shortness of breath on exertion) 10/12/2018   Osteopenia of neck of right femur 06/29/2018   Breast cancer, right ([place]) 06/17/2016   Essential hypertension 12/13/2015   Breast abscess of female 11/07/2015   Familial multiple lipoprotein-type hyperlipidemia 10/25/2014   Clinical depression 10/25/2014   Acid reflux 10/25/2014   Acute gastrointestinal bleeding 10/25/2014   Aggrieved 10/25/2014   Airway hyperreactivity 10/25/2014   Arthritis of knee 10/25/2014   H/O: gout 10/25/2014   Adiposity 10/25/2014   Arthritis, degenerative 10/25/2014   H/O adenomatous polyp of colon 03/07/2014    Allergies  Allergen Reactions   Sulfa Antibiotics     Past Surgical History:  Procedure Laterality Date   BREAST BIOPSY Bilateral    negative   BREAST EXCISIONAL BIOPSY Right    right positive 04/2010   BREAST LUMPECTOMY Right    removed a cancerous lump   VAGINAL HYSTERECTOMY      Social History   Tobacco Use   Smoking status: Former  Packs/day: 0.25    Years: 2.00    Pack years: 0.50    Types: Cigarettes   Smokeless tobacco: Never   Tobacco comments:    smoking cessation materials not required  Vaping Use   Vaping Use: Never used  Substance Use Topics   Alcohol use: No    Alcohol/week: 0.0 standard drinks   Drug use: No     Medication list has been reviewed and updated.  Current Meds  Medication Sig   albuterol (VENTOLIN HFA) 108 (90 Base) MCG/ACT inhaler Inhale 2 puffs into the lungs every 6 (six) hours as needed for wheezing or shortness of breath.  Include aerochamber   allopurinol (ZYLOPRIM) 100 MG tablet TAKE 1 TABLET EVERY DAY   calcium carbonate (TUMS - DOSED IN MG ELEMENTAL CALCIUM) 500 MG chewable tablet Chew 1 tablet by mouth every 8 (eight) hours as needed for indigestion.   cetirizine (ZYRTEC) 10 MG tablet Take 10 mg by mouth daily. For allergies.   ELIQUIS 5 MG TABS tablet TAKE (1) TABLET BY MOUTH TWICE DAILY   ferrous sulfate 325 (65 FE) MG tablet Take 325 mg by mouth daily with breakfast.   Fluticasone-Umeclidin-Vilant (TRELEGY ELLIPTA) 100-62.5-25 MCG/INH AEPB Inhale 1 puff into the lungs daily.   losartan (COZAAR) 25 MG tablet TAKE (1) TABLET BY MOUTH EVERY DAY   lovastatin (MEVACOR) 40 MG tablet TAKE 1 TABLET EVERY DAY AT BEDTIME   meclizine (ANTIVERT) 12.5 MG tablet Take 1 tablet (12.5 mg total) by mouth 3 (three) times daily as needed for dizziness.   montelukast (SINGULAIR) 10 MG tablet Take 1 tablet (10 mg total) by mouth at bedtime.   mupirocin ointment (BACTROBAN) 2 % Apply 1 application topically 2 (two) times daily.   nystatin cream (MYCOSTATIN) Apply 1 application topically 2 (two) times daily.   omeprazole (PRILOSEC) 20 MG capsule One a day   polyethylene glycol (MIRALAX / GLYCOLAX) 17 g packet Take 17 g by mouth daily as needed.   sertraline (ZOLOFT) 50 MG tablet One a day   traZODone (DESYREL) 50 MG tablet TAKE 1 TABLET EVERY DAY AT BEDTIME    PHQ 2/9 Scores 01/14/2021 10/01/2020 08/05/2020 05/20/2020  PHQ - 2 Score 6 2 3 2   PHQ- 9 Score 13 3 4 5     GAD 7 : Generalized Anxiety Score 10/01/2020 02/23/2020 12/21/2019 11/14/2019  Nervous, Anxious, on Edge 0 0 3 0  Control/stop worrying 0 0 3 0  Worry too much - different things 0 0 3 0  Trouble relaxing 0 0 1 0  Restless 0 0 0 0  Easily annoyed or irritable 0 0 0 0  Afraid - awful might happen 0 0 0 0  Total GAD 7 Score 0 0 10 0  Anxiety Difficulty - Not difficult at all Not difficult at all Not difficult at all    BP Readings from Last 3 Encounters:   01/31/21 (!) 110/50  01/14/21 104/68  10/01/20 104/62    Physical Exam Vitals and nursing note reviewed.  HENT:     Right Ear: Tympanic membrane normal.     Left Ear: Tympanic membrane normal.     Nose: Congestion present.  Cardiovascular:     Heart sounds: No murmur heard.   No friction rub. No gallop.  Pulmonary:     Effort: No respiratory distress.     Breath sounds: No wheezing, rhonchi or rales.  Musculoskeletal:     Cervical back: Neck supple.  Neurological:     Mental Status:  She is alert.    Wt Readings from Last 3 Encounters:  01/31/21 224 lb (101.6 kg)  01/14/21 224 lb (101.6 kg)  10/01/20 226 lb (102.5 kg)    BP (!) 110/50   Pulse 80   Temp 99.6 F (37.6 C) (Oral)   Ht 5\' 3"  (1.6 m)   Wt 224 lb (101.6 kg)   SpO2 91%   BMI 39.68 kg/m   Assessment and Plan:  1. Fever and chills New onset.  Persistent.  Unstable with a pulse ox of 91%.  Patient's head shortness of breath with cough for the past 48 hours gradually worsening.  Patient is noted to be febrile and has a risk score of 6.  My concern is that she may have COVID and we have asked her to go to next level of evaluation where she can have chest x-ray, white count, and COVID testing.  Blood pressure is low decreased from what her baseline is and would also entertain the possibility of sepsis.  2. Acute cough Patient's had a cough that is productive of sputum but no hemoptysis or dark sputum.  Nevertheless this is consistent with the possibility of pneumonia and we will have her further evaluated.  3. Hypoxia Pulse ox is noted to be 91% and patient's respiratory rate at rest is 16.  At this point time we will further evaluate for respiratory concerns such as COVID or CAP.

## 2021-02-11 DIAGNOSIS — R197 Diarrhea, unspecified: Secondary | ICD-10-CM | POA: Diagnosis not present

## 2021-02-11 DIAGNOSIS — R5383 Other fatigue: Secondary | ICD-10-CM | POA: Diagnosis not present

## 2021-02-11 DIAGNOSIS — I609 Nontraumatic subarachnoid hemorrhage, unspecified: Secondary | ICD-10-CM | POA: Diagnosis not present

## 2021-02-11 DIAGNOSIS — R930 Abnormal findings on diagnostic imaging of skull and head, not elsewhere classified: Secondary | ICD-10-CM | POA: Diagnosis not present

## 2021-02-11 DIAGNOSIS — N3000 Acute cystitis without hematuria: Secondary | ICD-10-CM | POA: Diagnosis not present

## 2021-02-11 DIAGNOSIS — I10 Essential (primary) hypertension: Secondary | ICD-10-CM | POA: Diagnosis not present

## 2021-02-11 DIAGNOSIS — J449 Chronic obstructive pulmonary disease, unspecified: Secondary | ICD-10-CM | POA: Diagnosis not present

## 2021-02-11 DIAGNOSIS — B9620 Unspecified Escherichia coli [E. coli] as the cause of diseases classified elsewhere: Secondary | ICD-10-CM | POA: Diagnosis not present

## 2021-02-11 DIAGNOSIS — R4189 Other symptoms and signs involving cognitive functions and awareness: Secondary | ICD-10-CM | POA: Diagnosis not present

## 2021-02-11 DIAGNOSIS — R059 Cough, unspecified: Secondary | ICD-10-CM | POA: Diagnosis not present

## 2021-02-11 DIAGNOSIS — R110 Nausea: Secondary | ICD-10-CM | POA: Diagnosis not present

## 2021-02-11 DIAGNOSIS — R531 Weakness: Secondary | ICD-10-CM | POA: Diagnosis not present

## 2021-02-11 DIAGNOSIS — U071 COVID-19: Secondary | ICD-10-CM | POA: Diagnosis not present

## 2021-02-11 DIAGNOSIS — R0902 Hypoxemia: Secondary | ICD-10-CM | POA: Diagnosis not present

## 2021-02-12 DIAGNOSIS — R3915 Urgency of urination: Secondary | ICD-10-CM | POA: Diagnosis not present

## 2021-02-12 DIAGNOSIS — I6782 Cerebral ischemia: Secondary | ICD-10-CM | POA: Diagnosis not present

## 2021-02-12 DIAGNOSIS — R531 Weakness: Secondary | ICD-10-CM | POA: Diagnosis not present

## 2021-02-12 DIAGNOSIS — R197 Diarrhea, unspecified: Secondary | ICD-10-CM | POA: Diagnosis not present

## 2021-02-12 DIAGNOSIS — Z86718 Personal history of other venous thrombosis and embolism: Secondary | ICD-10-CM | POA: Diagnosis not present

## 2021-02-12 DIAGNOSIS — R300 Dysuria: Secondary | ICD-10-CM | POA: Diagnosis not present

## 2021-02-12 DIAGNOSIS — J449 Chronic obstructive pulmonary disease, unspecified: Secondary | ICD-10-CM | POA: Diagnosis not present

## 2021-02-12 DIAGNOSIS — R0902 Hypoxemia: Secondary | ICD-10-CM | POA: Diagnosis not present

## 2021-02-12 DIAGNOSIS — N3000 Acute cystitis without hematuria: Secondary | ICD-10-CM | POA: Diagnosis not present

## 2021-02-12 DIAGNOSIS — I10 Essential (primary) hypertension: Secondary | ICD-10-CM | POA: Diagnosis not present

## 2021-02-12 DIAGNOSIS — R110 Nausea: Secondary | ICD-10-CM | POA: Diagnosis not present

## 2021-02-12 DIAGNOSIS — R8281 Pyuria: Secondary | ICD-10-CM | POA: Diagnosis not present

## 2021-02-12 DIAGNOSIS — I609 Nontraumatic subarachnoid hemorrhage, unspecified: Secondary | ICD-10-CM | POA: Diagnosis not present

## 2021-02-13 DIAGNOSIS — Z8616 Personal history of COVID-19: Secondary | ICD-10-CM | POA: Diagnosis not present

## 2021-02-13 DIAGNOSIS — B9620 Unspecified Escherichia coli [E. coli] as the cause of diseases classified elsewhere: Secondary | ICD-10-CM | POA: Diagnosis not present

## 2021-02-13 DIAGNOSIS — N390 Urinary tract infection, site not specified: Secondary | ICD-10-CM | POA: Diagnosis not present

## 2021-02-13 DIAGNOSIS — I10 Essential (primary) hypertension: Secondary | ICD-10-CM | POA: Diagnosis not present

## 2021-02-13 DIAGNOSIS — J449 Chronic obstructive pulmonary disease, unspecified: Secondary | ICD-10-CM | POA: Diagnosis not present

## 2021-02-13 DIAGNOSIS — R5381 Other malaise: Secondary | ICD-10-CM | POA: Diagnosis not present

## 2021-02-13 DIAGNOSIS — R531 Weakness: Secondary | ICD-10-CM | POA: Diagnosis not present

## 2021-02-14 DIAGNOSIS — R0902 Hypoxemia: Secondary | ICD-10-CM | POA: Diagnosis not present

## 2021-02-15 DIAGNOSIS — R0902 Hypoxemia: Secondary | ICD-10-CM | POA: Diagnosis not present

## 2021-02-16 DIAGNOSIS — R0902 Hypoxemia: Secondary | ICD-10-CM | POA: Diagnosis not present

## 2021-02-17 DIAGNOSIS — R0902 Hypoxemia: Secondary | ICD-10-CM | POA: Diagnosis not present

## 2021-02-18 DIAGNOSIS — R0902 Hypoxemia: Secondary | ICD-10-CM | POA: Diagnosis not present

## 2021-02-19 DIAGNOSIS — E782 Mixed hyperlipidemia: Secondary | ICD-10-CM | POA: Diagnosis not present

## 2021-02-19 DIAGNOSIS — R262 Difficulty in walking, not elsewhere classified: Secondary | ICD-10-CM | POA: Diagnosis not present

## 2021-02-19 DIAGNOSIS — Z86711 Personal history of pulmonary embolism: Secondary | ICD-10-CM | POA: Diagnosis not present

## 2021-02-19 DIAGNOSIS — R197 Diarrhea, unspecified: Secondary | ICD-10-CM | POA: Diagnosis not present

## 2021-02-19 DIAGNOSIS — R279 Unspecified lack of coordination: Secondary | ICD-10-CM | POA: Diagnosis not present

## 2021-02-19 DIAGNOSIS — R5381 Other malaise: Secondary | ICD-10-CM | POA: Diagnosis not present

## 2021-02-19 DIAGNOSIS — E7800 Pure hypercholesterolemia, unspecified: Secondary | ICD-10-CM | POA: Diagnosis not present

## 2021-02-19 DIAGNOSIS — E119 Type 2 diabetes mellitus without complications: Secondary | ICD-10-CM | POA: Diagnosis not present

## 2021-02-19 DIAGNOSIS — G4700 Insomnia, unspecified: Secondary | ICD-10-CM | POA: Diagnosis not present

## 2021-02-19 DIAGNOSIS — Z86718 Personal history of other venous thrombosis and embolism: Secondary | ICD-10-CM | POA: Diagnosis not present

## 2021-02-19 DIAGNOSIS — M6281 Muscle weakness (generalized): Secondary | ICD-10-CM | POA: Diagnosis not present

## 2021-02-19 DIAGNOSIS — J45909 Unspecified asthma, uncomplicated: Secondary | ICD-10-CM | POA: Diagnosis not present

## 2021-02-19 DIAGNOSIS — K219 Gastro-esophageal reflux disease without esophagitis: Secondary | ICD-10-CM | POA: Diagnosis not present

## 2021-02-19 DIAGNOSIS — U071 COVID-19: Secondary | ICD-10-CM | POA: Diagnosis not present

## 2021-02-19 DIAGNOSIS — Z8739 Personal history of other diseases of the musculoskeletal system and connective tissue: Secondary | ICD-10-CM | POA: Diagnosis not present

## 2021-02-19 DIAGNOSIS — Z86018 Personal history of other benign neoplasm: Secondary | ICD-10-CM | POA: Diagnosis not present

## 2021-02-19 DIAGNOSIS — M109 Gout, unspecified: Secondary | ICD-10-CM | POA: Diagnosis not present

## 2021-02-19 DIAGNOSIS — B9620 Unspecified Escherichia coli [E. coli] as the cause of diseases classified elsewhere: Secondary | ICD-10-CM | POA: Diagnosis not present

## 2021-02-19 DIAGNOSIS — Z8616 Personal history of COVID-19: Secondary | ICD-10-CM | POA: Diagnosis not present

## 2021-02-19 DIAGNOSIS — J449 Chronic obstructive pulmonary disease, unspecified: Secondary | ICD-10-CM | POA: Diagnosis not present

## 2021-02-19 DIAGNOSIS — R531 Weakness: Secondary | ICD-10-CM | POA: Diagnosis not present

## 2021-02-19 DIAGNOSIS — F419 Anxiety disorder, unspecified: Secondary | ICD-10-CM | POA: Diagnosis not present

## 2021-02-19 DIAGNOSIS — R5383 Other fatigue: Secondary | ICD-10-CM | POA: Diagnosis not present

## 2021-02-19 DIAGNOSIS — R0902 Hypoxemia: Secondary | ICD-10-CM | POA: Diagnosis not present

## 2021-02-19 DIAGNOSIS — J4520 Mild intermittent asthma, uncomplicated: Secondary | ICD-10-CM | POA: Diagnosis not present

## 2021-02-19 DIAGNOSIS — R110 Nausea: Secondary | ICD-10-CM | POA: Diagnosis not present

## 2021-02-19 DIAGNOSIS — I10 Essential (primary) hypertension: Secondary | ICD-10-CM | POA: Diagnosis not present

## 2021-02-19 DIAGNOSIS — S06360D Traumatic hemorrhage of cerebrum, unspecified, without loss of consciousness, subsequent encounter: Secondary | ICD-10-CM | POA: Diagnosis not present

## 2021-02-27 DIAGNOSIS — M6281 Muscle weakness (generalized): Secondary | ICD-10-CM | POA: Diagnosis not present

## 2021-02-27 DIAGNOSIS — R262 Difficulty in walking, not elsewhere classified: Secondary | ICD-10-CM | POA: Diagnosis not present

## 2021-03-04 DIAGNOSIS — R5381 Other malaise: Secondary | ICD-10-CM | POA: Diagnosis not present

## 2021-03-04 DIAGNOSIS — F419 Anxiety disorder, unspecified: Secondary | ICD-10-CM | POA: Insufficient documentation

## 2021-03-04 DIAGNOSIS — I10 Essential (primary) hypertension: Secondary | ICD-10-CM | POA: Diagnosis not present

## 2021-03-04 DIAGNOSIS — E782 Mixed hyperlipidemia: Secondary | ICD-10-CM | POA: Diagnosis not present

## 2021-03-04 DIAGNOSIS — G4700 Insomnia, unspecified: Secondary | ICD-10-CM | POA: Diagnosis not present

## 2021-03-04 DIAGNOSIS — K219 Gastro-esophageal reflux disease without esophagitis: Secondary | ICD-10-CM | POA: Diagnosis not present

## 2021-03-04 DIAGNOSIS — Z8739 Personal history of other diseases of the musculoskeletal system and connective tissue: Secondary | ICD-10-CM | POA: Diagnosis not present

## 2021-03-04 DIAGNOSIS — Z86018 Personal history of other benign neoplasm: Secondary | ICD-10-CM | POA: Diagnosis not present

## 2021-03-04 DIAGNOSIS — Z8616 Personal history of COVID-19: Secondary | ICD-10-CM | POA: Diagnosis not present

## 2021-03-12 ENCOUNTER — Other Ambulatory Visit: Payer: Self-pay

## 2021-03-12 ENCOUNTER — Encounter: Payer: Self-pay | Admitting: Family Medicine

## 2021-03-12 ENCOUNTER — Ambulatory Visit (INDEPENDENT_AMBULATORY_CARE_PROVIDER_SITE_OTHER): Payer: Medicare HMO | Admitting: Family Medicine

## 2021-03-12 VITALS — BP 104/72 | HR 86 | Ht 63.0 in | Wt 224.0 lb

## 2021-03-12 DIAGNOSIS — Z8616 Personal history of COVID-19: Secondary | ICD-10-CM | POA: Diagnosis not present

## 2021-03-12 NOTE — Progress Notes (Signed)
Date:  03/12/2021   Name:  Kirsten Snyder   DOB:  07/03/1933   MRN:  [id number]   Chief Complaint: Follow-up  Patient is a 87 year old female who presents for a follow up hospital discharge  exam. The patient reports the following problems: none. Health maintenance has been reviewed up to date.     Lab Results  Component Value Date   CREATININE 0.93 01/14/2021   BUN 15 01/14/2021   NA 144 01/14/2021   K 4.3 01/14/2021   CL 102 01/14/2021   CO2 27 01/14/2021   Lab Results  Component Value Date   CHOL 221 (H) 01/14/2021   HDL 41 01/14/2021   LDLCALC 143 (H) 01/14/2021   TRIG 202 (H) 01/14/2021   CHOLHDL 4.8 (H) 11/02/2017   Lab Results  Component Value Date   TSH 2.39 09/15/2012   No results found for: HGBA1C Lab Results  Component Value Date   WBC 5.6 01/14/2021   HGB 13.4 01/14/2021   HCT 38.7 01/14/2021   MCV 90 01/14/2021   PLT 299 01/14/2021   Lab Results  Component Value Date   ALT 12 01/14/2021   AST 20 01/14/2021   ALKPHOS 65 01/14/2021   BILITOT 0.9 01/14/2021   No components found for: VITD  Review of Systems  Constitutional:  Negative for chills and fever.  HENT:  Negative for drooling, ear discharge, ear pain and sore throat.   Respiratory:  Negative for cough, shortness of breath and wheezing.   Cardiovascular:  Negative for chest pain, palpitations and leg swelling.  Gastrointestinal:  Negative for abdominal pain, blood in stool, constipation, diarrhea and nausea.  Endocrine: Negative for polydipsia.  Genitourinary:  Negative for dysuria, frequency, hematuria and urgency.  Musculoskeletal:  Negative for back pain, myalgias and neck pain.  Skin:  Negative for rash.  Allergic/Immunologic: Negative for environmental allergies.  Neurological:  Negative for dizziness and headaches.  Hematological:  Does not bruise/bleed easily.  Psychiatric/Behavioral:  Negative for suicidal ideas. The patient is not nervous/anxious.    Patient Active  Problem List   Diagnosis Date Noted   Anxiety 03/04/2021   COPD (chronic obstructive pulmonary disease) ([place]) 02/12/2021   H/O total hysterectomy 08/05/2020   UTI (urinary tract infection) 02/27/2020   Fall at home, initial encounter 02/27/2020   S/P resection of meningioma 10/31/2019   Pulmonary emboli ([place]) 10/31/2019   Sepsis secondary to UTI ([place]) 10/01/2019   Sepsis ([place]) 10/01/2019   Hypokalemia 10/01/2019   Urinary urgency 09/15/2019   Atherosclerosis of abdominal aorta ([place]) 10/12/2018   SOBOE (shortness of breath on exertion) 10/12/2018   Osteopenia of neck of right femur 06/29/2018   Breast cancer, right ([place]) 06/17/2016   Essential hypertension 12/13/2015   Breast abscess of female 11/07/2015   Familial multiple lipoprotein-type hyperlipidemia 10/25/2014   Clinical depression 10/25/2014   Acid reflux 10/25/2014   Acute gastrointestinal bleeding 10/25/2014   Aggrieved 10/25/2014   Airway hyperreactivity 10/25/2014   Arthritis of knee 10/25/2014   H/O: gout 10/25/2014   Adiposity 10/25/2014   Arthritis, degenerative 10/25/2014   H/O adenomatous polyp of colon 03/07/2014    Allergies  Allergen Reactions   Sulfa Antibiotics     Past Surgical History:  Procedure Laterality Date   BREAST BIOPSY Bilateral    negative   BREAST EXCISIONAL BIOPSY Right    right positive 04/2010   BREAST LUMPECTOMY Right    removed a cancerous lump   VAGINAL HYSTERECTOMY  Social History   Tobacco Use   Smoking status: Former    Packs/day: 0.25    Years: 2.00    Pack years: 0.50    Types: Cigarettes   Smokeless tobacco: Never   Tobacco comments:    smoking cessation materials not required  Vaping Use   Vaping Use: Never used  Substance Use Topics   Alcohol use: Not Currently   Drug use: Never     Medication list has been reviewed and updated.  Current Meds  Medication Sig   albuterol (VENTOLIN HFA) 108 (90 Base) MCG/ACT inhaler Inhale 2 puffs into the lungs  every 6 (six) hours as needed for wheezing or shortness of breath. Include aerochamber   allopurinol (ZYLOPRIM) 100 MG tablet TAKE 1 TABLET EVERY DAY   calcium carbonate (TUMS - DOSED IN MG ELEMENTAL CALCIUM) 500 MG chewable tablet Chew 1 tablet by mouth every 8 (eight) hours as needed for indigestion.   cetirizine (ZYRTEC) 10 MG tablet Take 10 mg by mouth daily. For allergies.   ELIQUIS 5 MG TABS tablet TAKE (1) TABLET BY MOUTH TWICE DAILY   ferrous sulfate 325 (65 FE) MG tablet Take 325 mg by mouth daily with breakfast.   Fluticasone-Umeclidin-Vilant (TRELEGY ELLIPTA) 100-62.5-25 MCG/INH AEPB Inhale 1 puff into the lungs daily.   losartan (COZAAR) 25 MG tablet TAKE (1) TABLET BY MOUTH EVERY DAY   lovastatin (MEVACOR) 40 MG tablet TAKE 1 TABLET EVERY DAY AT BEDTIME   meclizine (ANTIVERT) 12.5 MG tablet Take 1 tablet (12.5 mg total) by mouth 3 (three) times daily as needed for dizziness.   Melatonin 3 MG TBDP Take 3 mg by mouth at bedtime.   montelukast (SINGULAIR) 10 MG tablet Take 1 tablet (10 mg total) by mouth at bedtime.   mupirocin ointment (BACTROBAN) 2 % Apply 1 application topically 2 (two) times daily.   nystatin cream (MYCOSTATIN) Apply 1 application topically 2 (two) times daily.   omeprazole (PRILOSEC) 20 MG capsule One a day   polyethylene glycol (MIRALAX / GLYCOLAX) 17 g packet Take 17 g by mouth daily as needed.   sertraline (ZOLOFT) 50 MG tablet One a day   traZODone (DESYREL) 50 MG tablet TAKE 1 TABLET EVERY DAY AT BEDTIME    PHQ 2/9 Scores 03/12/2021 01/14/2021 10/01/2020 08/05/2020  PHQ - 2 Score 6 6 2 3   PHQ- 9 Score 9 13 3 4     GAD 7 : Generalized Anxiety Score 03/12/2021 10/01/2020 02/23/2020 12/21/2019  Nervous, Anxious, on Edge 3 0 0 3  Control/stop worrying 3 0 0 3  Worry too much - different things 3 0 0 3  Trouble relaxing 3 0 0 1  Restless 0 0 0 0  Easily annoyed or irritable 2 0 0 0  Afraid - awful might happen 0 0 0 0  Total GAD 7 Score 14 0 0 10  Anxiety  Difficulty Somewhat difficult - Not difficult at all Not difficult at all    BP Readings from Last 3 Encounters:  03/12/21 104/72  01/31/21 (!) 110/50  01/14/21 104/68    Physical Exam Vitals and nursing note reviewed.  Constitutional:      General: She is not in acute distress.    Appearance: She is not diaphoretic.  HENT:     Head: Normocephalic and atraumatic.     Right Ear: External ear normal.     Left Ear: External ear normal.     Nose: Nose normal.  Eyes:     General:  Right eye: No discharge.        Left eye: No discharge.     Conjunctiva/sclera: Conjunctivae normal.     Pupils: Pupils are equal, round, and reactive to light.  Neck:     Thyroid: No thyromegaly.     Vascular: No JVD.  Cardiovascular:     Rate and Rhythm: Normal rate and regular rhythm.     Heart sounds: Normal heart sounds. No murmur heard.   No friction rub. No gallop.  Pulmonary:     Effort: Pulmonary effort is normal.     Breath sounds: Normal breath sounds.  Abdominal:     General: Bowel sounds are normal.     Palpations: Abdomen is soft. There is no mass.     Tenderness: There is no abdominal tenderness. There is no guarding.  Musculoskeletal:        General: Normal range of motion.     Cervical back: Normal range of motion and neck supple.  Lymphadenopathy:     Cervical: No cervical adenopathy.  Skin:    General: Skin is warm and dry.  Neurological:     Mental Status: She is alert.     Deep Tendon Reflexes: Reflexes are normal and symmetric.    Wt Readings from Last 3 Encounters:  03/12/21 224 lb (101.6 kg)  01/31/21 224 lb (101.6 kg)  01/14/21 224 lb (101.6 kg)    BP 104/72 (BP Location: Left Arm, Patient Position: Sitting, Cuff Size: Normal)   Pulse 86   Ht 5\' 3"  (1.6 m)   Wt 224 lb (101.6 kg)   SpO2 96%   BMI 39.68 kg/m   Assessment and Plan:  1. Post-COVID syndrome resolved New onset.  Patient is post recovery from [place] with debilitation that necessitated  hospitalization and rehab.  Patient is doing well with no residual symptoms.  Rehab has gotten reasonable strength and patient has a very good disposition relative to previous encounters.

## 2021-03-18 ENCOUNTER — Telehealth: Payer: Self-pay | Admitting: Family Medicine

## 2021-03-18 ENCOUNTER — Other Ambulatory Visit: Payer: Self-pay

## 2021-03-18 DIAGNOSIS — Z86711 Personal history of pulmonary embolism: Secondary | ICD-10-CM

## 2021-03-18 MED ORDER — APIXABAN 5 MG PO TABS: ORAL_TABLET | ORAL | 0 refills | Status: DC

## 2021-03-18 NOTE — Telephone Encounter (Signed)
Tried calling number listed for PT/OT and no VM is set up to leave a VM for these verbal orders.

## 2021-03-18 NOTE — Telephone Encounter (Signed)
Copied from [place] [id number]. Topic: Quick Communication - Home Health Verbal Orders >> Mar 18, 2021  8:38 AM Leward Quan A wrote: Caller/Agency: Amy // [place] Number: [phone] ok to LM  Requesting OT/PT/Skilled Nursing/Social Work/Speech Therapy: PT  Frequency: 2 w 3, 1 w 4 Patient declined home health aide also need to know if patient need to start back taking her Eloquis

## 2021-03-18 NOTE — Telephone Encounter (Signed)
Patient informed per MRI addended at the hospital she does not have a hemorrhage but a artifact was present in the MRI. Spoke with Dr. Army Melia since Dr. [name] is out of the office and pt was told to start back taking Eliquis BID daily.   Pt said she started to get a sore throat this morning with no other symptoms. Offered her an appt to sree Dr. Army Melia and she said she doesn't want to come in the office. Told her we would have to see her in person before we can prescribe medications. Told her if she gets worse or no better, to call the office back and schedule an appt and we would be glad to see her.

## 2021-04-22 DIAGNOSIS — R0902 Hypoxemia: Secondary | ICD-10-CM | POA: Diagnosis not present

## 2021-04-22 DIAGNOSIS — R5383 Other fatigue: Secondary | ICD-10-CM | POA: Diagnosis not present

## 2021-04-22 DIAGNOSIS — J4520 Mild intermittent asthma, uncomplicated: Secondary | ICD-10-CM | POA: Diagnosis not present

## 2021-04-22 DIAGNOSIS — U099 Post covid-19 condition, unspecified: Secondary | ICD-10-CM | POA: Diagnosis not present

## 2021-04-22 DIAGNOSIS — F419 Anxiety disorder, unspecified: Secondary | ICD-10-CM | POA: Diagnosis not present

## 2021-04-22 DIAGNOSIS — G4700 Insomnia, unspecified: Secondary | ICD-10-CM | POA: Diagnosis not present

## 2021-04-22 DIAGNOSIS — I10 Essential (primary) hypertension: Secondary | ICD-10-CM | POA: Diagnosis not present

## 2021-04-22 DIAGNOSIS — R5381 Other malaise: Secondary | ICD-10-CM | POA: Diagnosis not present

## 2021-04-22 DIAGNOSIS — J449 Chronic obstructive pulmonary disease, unspecified: Secondary | ICD-10-CM | POA: Diagnosis not present

## 2021-05-21 ENCOUNTER — Ambulatory Visit (INDEPENDENT_AMBULATORY_CARE_PROVIDER_SITE_OTHER): Payer: Medicare HMO

## 2021-05-21 DIAGNOSIS — Z0000 Encounter for general adult medical examination without abnormal findings: Secondary | ICD-10-CM

## 2021-05-21 NOTE — Patient Instructions (Signed)
Kirsten Snyder , Thank you for taking time to come for your Medicare Wellness Visit. I appreciate your ongoing commitment to your health goals. Please review the following plan we discussed and let me know if I can assist you in the future.   Screening recommendations/referrals: Colonoscopy: no longer required Mammogram: done 08/07/20 Bone Density: done 01/03/18 Recommended yearly ophthalmology/optometry visit for glaucoma screening and checkup Recommended yearly dental visit for hygiene and checkup  Vaccinations: Influenza vaccine: done 01/14/21 Pneumococcal vaccine: done 07/26/18 Tdap vaccine: done 05/20/15 Shingles vaccine: Shingrix discussed. Please contact your pharmacy for coverage information.  Covid-19:done 07/14/19 & 08/08/19  Advanced directives: Please bring a copy of your health care power of attorney and living will to the office at your convenience.   Conditions/risks identified: Recommend continuing fall prevention at home  Next appointment: Follow up in one year for your annual wellness visit    Preventive Care 65 Years and Older, Female Preventive care refers to lifestyle choices and visits with your health care provider that can promote health and wellness. What does preventive care include? A yearly physical exam. This is also called an annual well check. Dental exams once or twice a year. Routine eye exams. Ask your health care provider how often you should have your eyes checked. Personal lifestyle choices, including: Daily care of your teeth and gums. Regular physical activity. Eating a healthy diet. Avoiding tobacco and drug use. Limiting alcohol use. Practicing safe sex. Taking low-dose aspirin every day. Taking vitamin and mineral supplements as recommended by your health care provider. What happens during an annual well check? The services and screenings done by your health care provider during your annual well check will depend on your age, overall health,  lifestyle risk factors, and family history of disease. Counseling  Your health care provider may ask you questions about your: Alcohol use. Tobacco use. Drug use. Emotional well-being. Home and relationship well-being. Sexual activity. Eating habits. History of falls. Memory and ability to understand (cognition). Work and work Statistician. Reproductive health. Screening  You may have the following tests or measurements: Height, weight, and BMI. Blood pressure. Lipid and cholesterol levels. These may be checked every 5 years, or more frequently if you are over 45 years old. Skin check. Lung cancer screening. You may have this screening every year starting at age 63 if you have a 30-pack-year history of smoking and currently smoke or have quit within the past 15 years. Fecal occult blood test (FOBT) of the stool. You may have this test every year starting at age 30. Flexible sigmoidoscopy or colonoscopy. You may have a sigmoidoscopy every 5 years or a colonoscopy every 10 years starting at age 8. Hepatitis C blood test. Hepatitis B blood test. Sexually transmitted disease (STD) testing. Diabetes screening. This is done by checking your blood sugar (glucose) after you have not eaten for a while (fasting). You may have this done every 1-3 years. Bone density scan. This is done to screen for osteoporosis. You may have this done starting at age 25. Mammogram. This may be done every 1-2 years. Talk to your health care provider about how often you should have regular mammograms. Talk with your health care provider about your test results, treatment options, and if necessary, the need for more tests. Vaccines  Your health care provider may recommend certain vaccines, such as: Influenza vaccine. This is recommended every year. Tetanus, diphtheria, and acellular pertussis (Tdap, Td) vaccine. You may need a Td booster every 10 years. Zoster vaccine.  You may need this after age  36. Pneumococcal 13-valent conjugate (PCV13) vaccine. One dose is recommended after age 33. Pneumococcal polysaccharide (PPSV23) vaccine. One dose is recommended after age 57. Talk to your health care provider about which screenings and vaccines you need and how often you need them. This information is not intended to replace advice given to you by your health care provider. Make sure you discuss any questions you have with your health care provider. Document Released: 05/03/2015 Document Revised: 12/25/2015 Document Reviewed: 02/05/2015 Elsevier Interactive Patient Education  2017 [employer] Prevention in the Home Falls can cause injuries. They can happen to people of all ages. There are many things you can do to make your home safe and to help prevent falls. What can I do on the outside of my home? Regularly fix the edges of walkways and driveways and fix any cracks. Remove anything that might make you trip as you walk through a door, such as a raised step or threshold. Trim any bushes or trees on the path to your home. Use bright outdoor lighting. Clear any walking paths of anything that might make someone trip, such as rocks or tools. Regularly check to see if handrails are loose or broken. Make sure that both sides of any steps have handrails. Any raised decks and porches should have guardrails on the edges. Have any leaves, snow, or ice cleared regularly. Use sand or salt on walking paths during winter. Clean up any spills in your garage right away. This includes oil or grease spills. What can I do in the bathroom? Use night lights. Install grab bars by the toilet and in the tub and shower. Do not use towel bars as grab bars. Use non-skid mats or decals in the tub or shower. If you need to sit down in the shower, use a plastic, non-slip stool. Keep the floor dry. Clean up any water that spills on the floor as soon as it happens. Remove soap buildup in the tub or shower  regularly. Attach bath mats securely with double-sided non-slip rug tape. Do not have throw rugs and other things on the floor that can make you trip. What can I do in the bedroom? Use night lights. Make sure that you have a light by your bed that is easy to reach. Do not use any sheets or blankets that are too big for your bed. They should not hang down onto the floor. Have a firm chair that has side arms. You can use this for support while you get dressed. Do not have throw rugs and other things on the floor that can make you trip. What can I do in the kitchen? Clean up any spills right away. Avoid walking on wet floors. Keep items that you use a lot in easy-to-reach places. If you need to reach something above you, use a strong step stool that has a grab bar. Keep electrical cords out of the way. Do not use floor polish or wax that makes floors slippery. If you must use wax, use non-skid floor wax. Do not have throw rugs and other things on the floor that can make you trip. What can I do with my stairs? Do not leave any items on the stairs. Make sure that there are handrails on both sides of the stairs and use them. Fix handrails that are broken or loose. Make sure that handrails are as long as the stairways. Check any carpeting to make sure that it is  firmly attached to the stairs. Fix any carpet that is loose or worn. Avoid having throw rugs at the top or bottom of the stairs. If you do have throw rugs, attach them to the floor with carpet tape. Make sure that you have a light switch at the top of the stairs and the bottom of the stairs. If you do not have them, ask someone to add them for you. What else can I do to help prevent falls? Wear shoes that: Do not have high heels. Have rubber bottoms. Are comfortable and fit you well. Are closed at the toe. Do not wear sandals. If you use a stepladder: Make sure that it is fully opened. Do not climb a closed stepladder. Make sure that  both sides of the stepladder are locked into place. Ask someone to hold it for you, if possible. Clearly mark and make sure that you can see: Any grab bars or handrails. First and last steps. Where the edge of each step is. Use tools that help you move around (mobility aids) if they are needed. These include: Canes. Walkers. Scooters. Crutches. Turn on the lights when you go into a dark area. Replace any light bulbs as soon as they burn out. Set up your furniture so you have a clear path. Avoid moving your furniture around. If any of your floors are uneven, fix them. If there are any pets around you, be aware of where they are. Review your medicines with your doctor. Some medicines can make you feel dizzy. This can increase your chance of falling. Ask your doctor what other things that you can do to help prevent falls. This information is not intended to replace advice given to you by your health care provider. Make sure you discuss any questions you have with your health care provider. Document Released: 01/31/2009 Document Revised: 09/12/2015 Document Reviewed: 05/11/2014 Elsevier Interactive Patient Education  2017 [employer].

## 2021-05-21 NOTE — Progress Notes (Signed)
Subjective:   Kirsten Snyder is a 88 y.o. female who presents for Medicare Annual (Subsequent) preventive examination.  Virtual Visit via Telephone Note  I connected with  Renford Dills on 05/21/21 at  2:00 PM EST by telephone and verified that I am speaking with the correct person using two identifiers.  Location: Patient: home Provider: [facility] Persons participating in the virtual visit: [place]   I discussed the limitations, risks, security and privacy concerns of performing an evaluation and management service by telephone and the availability of in person appointments. The patient expressed understanding and agreed to proceed.  Interactive audio and video telecommunications were attempted between this nurse and patient, however failed, due to patient having technical difficulties OR patient did not have access to video capability.  We continued and completed visit with audio only.  Some vital signs may be absent or patient reported.   Clemetine Marker, LPN   Review of Systems     Cardiac Risk Factors include: advanced age (>67men, >74 women);dyslipidemia;hypertension;obesity (BMI >30kg/m2);sedentary lifestyle     Objective:    There were no vitals filed for this visit. There is no height or weight on file to calculate BMI.  Advanced Directives 05/21/2021 05/20/2020 02/28/2020 10/01/2019 05/10/2019 08/03/2018 12/13/2017  Does Patient Have a Medical Advance Directive? Yes Yes Yes Yes No No No  Type of Paramedic of [place];Living will [place];Living will Healthcare Power of [place] - - -  Does patient want to make changes to medical advance directive? - - No - Patient declined No - Patient declined - - -  Copy of [place] in Chart? No - copy requested No - copy requested No - copy requested No - copy requested - - -  Would patient like information on creating a medical  advance directive? - - - - No - Patient declined No - Patient declined Yes (MAU/Ambulatory/Procedural Areas - Information given)    Current Medications (verified) Outpatient Encounter Medications as of 05/21/2021  Medication Sig   albuterol (VENTOLIN HFA) 108 (90 Base) MCG/ACT inhaler Inhale 2 puffs into the lungs every 6 (six) hours as needed for wheezing or shortness of breath. Include aerochamber   allopurinol (ZYLOPRIM) 100 MG tablet TAKE 1 TABLET EVERY DAY   apixaban (ELIQUIS) 5 MG TABS tablet TAKE (1) TABLET BY MOUTH TWICE DAILY   calcium carbonate (TUMS - DOSED IN MG ELEMENTAL CALCIUM) 500 MG chewable tablet Chew 1 tablet by mouth every 8 (eight) hours as needed for indigestion.   cetirizine (ZYRTEC) 10 MG tablet Take 10 mg by mouth daily. For allergies.   ferrous sulfate 325 (65 FE) MG tablet Take 325 mg by mouth daily with breakfast.   Fluticasone-Umeclidin-Vilant (TRELEGY ELLIPTA) 100-62.5-25 MCG/INH AEPB Inhale 1 puff into the lungs daily.   losartan (COZAAR) 25 MG tablet TAKE (1) TABLET BY MOUTH EVERY DAY   lovastatin (MEVACOR) 40 MG tablet TAKE 1 TABLET EVERY DAY AT BEDTIME   meclizine (ANTIVERT) 12.5 MG tablet Take 1 tablet (12.5 mg total) by mouth 3 (three) times daily as needed for dizziness.   Melatonin 3 MG TBDP Take 3 mg by mouth at bedtime.   montelukast (SINGULAIR) 10 MG tablet Take 1 tablet (10 mg total) by mouth at bedtime.   mupirocin ointment (BACTROBAN) 2 % Apply 1 application topically 2 (two) times daily.   nystatin cream (MYCOSTATIN) Apply 1 application topically 2 (two) times daily.   omeprazole (  PRILOSEC) 20 MG capsule One a day   polyethylene glycol (MIRALAX / GLYCOLAX) 17 g packet Take 17 g by mouth daily as needed.   sertraline (ZOLOFT) 100 MG tablet Take 100 mg by mouth daily.   traZODone (DESYREL) 50 MG tablet TAKE 1 TABLET EVERY DAY AT BEDTIME   [DISCONTINUED] sertraline (ZOLOFT) 50 MG tablet One a day   No facility-administered encounter medications on  file as of 05/21/2021.    Allergies (verified) Sulfa antibiotics   History: Past Medical History:  Diagnosis Date   Allergy    Asthma    Breast cancer ([place])    Cancer ([place]) 2012   right breast cancer lumpectomy with rad tx   Depression    Edema    GERD (gastroesophageal reflux disease)    Gout    Hyperlipidemia    Hypertension    Personal history of radiation therapy    Past Surgical History:  Procedure Laterality Date   BREAST BIOPSY Bilateral    negative   BREAST EXCISIONAL BIOPSY Right    right positive 04/2010   BREAST LUMPECTOMY Right    removed a cancerous lump   VAGINAL HYSTERECTOMY     Family History  Problem Relation Age of Onset   Leukemia Mother    Heart disease Father    Breast cancer Neg Hx    Social History   Socioeconomic History   Marital status: Widowed    Spouse name: Not on file   Number of children: 2   Years of education: 12+   Highest education level: Some college, no degree  Occupational History   Occupation: Retired  Tobacco Use   Smoking status: Former    Packs/day: 0.25    Years: 2.00    Pack years: 0.50    Types: Cigarettes   Smokeless tobacco: Never   Tobacco comments:    smoking cessation materials not required  Vaping Use   Vaping Use: Never used  Substance and Sexual Activity   Alcohol use: Not Currently   Drug use: Never   Sexual activity: Not Currently    Partners: Male  Other Topics Concern   Not on file  Social History Narrative   Granddaughter lives at home with patient.   Social Determinants of Health   Financial Resource Strain: Low Risk    Difficulty of Paying Living Expenses: Not hard at all  Food Insecurity: No Food Insecurity   Worried About Charity fundraiser in the Last Year: Never true   [place] in the Last Year: Never true  Transportation Needs: No Transportation Needs   Lack of Transportation (Medical): No   Lack of Transportation (Non-Medical): No  Physical Activity: Inactive   Days  of Exercise per Week: 0 days   Minutes of Exercise per Session: 0 min  Stress: No Stress Concern Present   Feeling of Stress : Only a little  Social Connections: Socially Isolated   Frequency of Communication with Friends and Family: More than three times a week   Frequency of Social Gatherings with Friends and Family: More than three times a week   Attends Religious Services: Never   Marine scientist or Organizations: No   Attends Archivist Meetings: Never   Marital Status: Widowed    Tobacco Counseling Counseling given: Not Answered Tobacco comments: smoking cessation materials not required   Clinical Intake:  Pre-visit preparation completed: Yes  Pain : No/denies pain     Nutritional Risks: None Diabetes: No  How often do you need to have someone help you when you read instructions, pamphlets, or other written materials from your doctor or pharmacy?: 1 - Never    Interpreter Needed?: No  Information entered by :: Clemetine Marker LPN   Activities of Daily Living In your present state of health, do you have any difficulty performing the following activities: 05/21/2021 03/12/2021  Hearing? Tempie Donning  Vision? N Y  Difficulty concentrating or making decisions? Tempie Donning  Walking or climbing stairs? Y Y  Dressing or bathing? N N  Doing errands, shopping? Tempie Donning  Preparing Food and eating ? N -  Using the Toilet? N -  In the past six months, have you accidently leaked urine? N -  Do you have problems with loss of bowel control? N -  Managing your Medications? Y -  Managing your Finances? Y -  Housekeeping or managing your Housekeeping? N -  Some recent data might be hidden    Patient Care Team: [name], MD as PCP - General (Family Medicine)  Indicate any recent Medical Services you may have received from other than [facility] providers in the past year (date may be approximate).     Assessment:   This is a routine wellness examination for  [place].  Hearing/Vision screen Hearing Screening - Comments:: Pt c/o mild hearing difficulty; declines hearing aids Vision Screening - Comments:: [facility] screen[facility]  Dietary issues and exercise activities discussed: Current Exercise Habits: The patient does not participate in regular exercise at present, Exercise limited by: None identified   Goals Addressed             This Visit's Progress    DIET - INCREASE WATER INTAKE   On track    Recommend to drink at least 6-8 8oz glasses of water per day.       Depression Screen PHQ 2/9 Scores 05/21/2021 03/12/2021 01/14/2021 10/01/2020 08/05/2020 05/20/2020 02/23/2020  PHQ - 2 Score 6 6 6 2 3 2 2   PHQ- 9 Score 10 9 13 3 4 5 9     Fall Risk Fall Risk  05/21/2021 03/12/2021 01/14/2021 08/05/2020 05/20/2020  Falls in the past year? 0 0 0 0 1  Number falls in past yr: 0 0 0 - 1  Injury with Fall? 0 0 0 - 1  Risk for fall due to : Impaired balance/gait Impaired balance/gait;Impaired mobility History of fall(s);Impaired balance/gait History of fall(s) History of fall(s)  Risk for fall due to: Comment - - - - -  Follow up Falls prevention discussed Falls evaluation completed Falls evaluation completed Falls evaluation completed Falls prevention discussed    FALL RISK PREVENTION PERTAINING TO THE HOME:  Any stairs in or around the home? Yes  If so, are there any without handrails? No  Home free of loose throw rugs in walkways, pet beds, electrical cords, etc? Yes  Adequate lighting in your home to reduce risk of falls? Yes   ASSISTIVE DEVICES UTILIZED TO PREVENT FALLS:  Life alert? No  Use of a cane, walker or w/c? Yes  Grab bars in the bathroom? Yes  Shower chair or bench in shower? Yes  Elevated toilet seat or a handicapped toilet? Yes   TIMED UP AND GO:  Was the test performed? No . Telephonic visit.   Cognitive Function: Normal cognitive status assessed by direct observation by this Nurse Health Advisor.  No abnormalities found.       6CIT Screen 05/10/2019 12/13/2017  What Year? 0 points 0 points  What month? 0 points 0 points  What time? 0 points 0 points  Count back from 20 0 points 0 points  Months in reverse 4 points 0 points  Repeat phrase 4 points 2 points  Total Score 8 2    Immunizations Immunization History  Administered Date(s) Administered   Fluad Quad(high Dose 65+) 04/11/2019, 12/21/2019, 01/14/2021   Influenza, High Dose Seasonal PF 12/31/2016   PFIZER(Purple Top)SARS-COV-2 Vaccination 07/14/2019, 08/08/2019   Pneumococcal Conjugate-13 12/31/2016   Pneumococcal Polysaccharide-23 07/26/2018   Tdap 05/20/2015    TDAP status: Up to date  Flu Vaccine status: Up to date  Pneumococcal vaccine status: Up to date  Covid-19 vaccine status: Completed vaccines  Qualifies for Shingles Vaccine? Yes   Zostavax completed No   Shingrix Completed?: No.    Education has been provided regarding the importance of this vaccine. Patient has been advised to call insurance company to determine out of pocket expense if they have not yet received this vaccine. Advised may also receive vaccine at local pharmacy or Health Dept. Verbalized acceptance and understanding.  Screening Tests Health Maintenance  Topic Date Due   Zoster Vaccines- Shingrix (1 of 2) Never done   COVID-19 Vaccine (3 - Pfizer risk series) 09/05/2019   MAMMOGRAM  08/07/2021   TETANUS/TDAP  05/19/2025   Pneumonia Vaccine 47+ Years old  Completed   INFLUENZA VACCINE  Completed   DEXA SCAN  Completed   HPV VACCINES  Aged Out    Health Maintenance  Health Maintenance Due  Topic Date Due   Zoster Vaccines- Shingrix (1 of 2) Never done   COVID-19 Vaccine (3 - Pfizer risk series) 09/05/2019    Colorectal cancer screening: No longer required.   Mammogram status: Completed 08/07/20. Repeat every year  Bone Density status: Completed 01/03/18. Results reflect: Bone density results: OSTEOPENIA. Repeat every 2  years.  Lung Cancer Screening: (Low Dose CT Chest recommended if Age 59-80 years, 30 pack-year currently smoking OR have quit w/in 15years.) does not qualify.   Additional Screening:  Hepatitis C Screening: does not qualify  Vision Screening: Recommended annual ophthalmology exams for early detection of glaucoma and other disorders of the eye. Is the patient up to date with their annual eye exam?  Yes  Who is the provider or what is the name of the office in which the patient attends annual eye exams? [facility].   Dental Screening: Recommended annual dental exams for proper oral hygiene  Community Resource Referral / Chronic Care Management: CRR required this visit?  No   CCM required this visit?  No      Plan:     I have personally reviewed and noted the following in the patients chart:   Medical and social history Use of alcohol, tobacco or illicit drugs  Current medications and supplements including opioid prescriptions.  Functional ability and status Nutritional status Physical activity Advanced directives List of other physicians Hospitalizations, surgeries, and ER visits in previous 12 months Vitals Screenings to include cognitive, depression, and falls Referrals and appointments  In addition, I have reviewed and discussed with patient certain preventive protocols, quality metrics, and best practice recommendations. A written personalized care plan for preventive services as well as general preventive health recommendations were provided to patient.   Due to this being a telephonic visit, the after visit summary with patients personalized plan was offered to patient via mail or my-chart. Patient declined at this time.  Clemetine Marker,  LPN   [id number]   Nurse Notes: none

## 2021-06-05 IMAGING — MG MM DIGITAL SCREENING BILAT W/ TOMO AND CAD
8 series · 8 of 24 positions shown · non-contrast
Comparison: Previous exam(s).

CLINICAL DATA: Screening. RIGHT lumpectomy in 1991

EXAM:
DIGITAL SCREENING BILATERAL MAMMOGRAM WITH TOMOSYNTHESIS AND CAD
TECHNIQUE: Bilateral screening digital craniocaudal and mediolateral oblique
mammograms were obtained. Bilateral screening digital breast
tomosynthesis was performed. The images were evaluated with
computer-aided detection. Limited assessment of the far posterior
tissue secondary to patient mobility and body habitus.

[L MLO synth-2D]
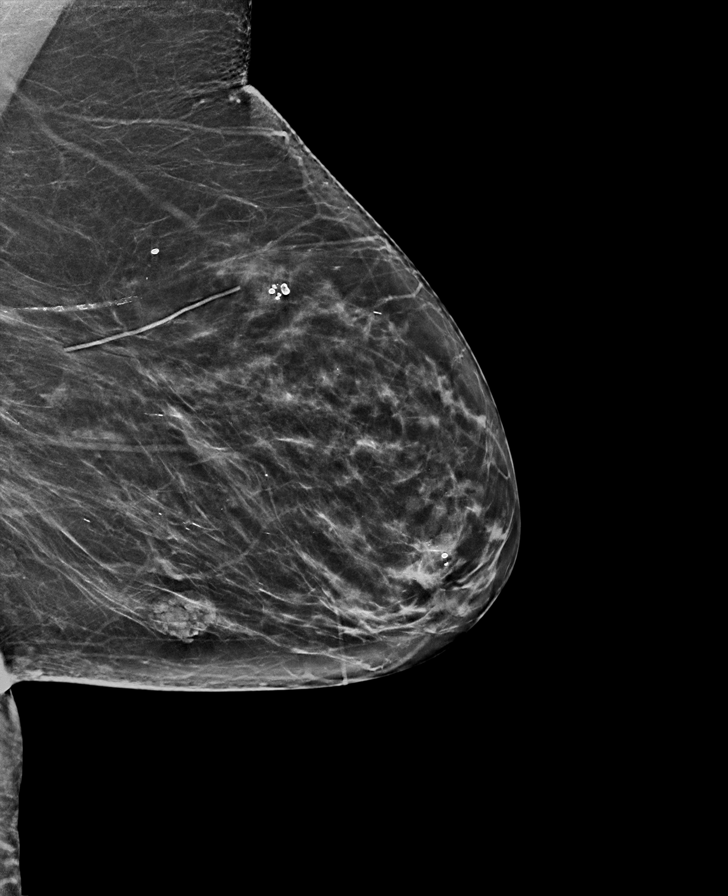

[R CC synth-2D]
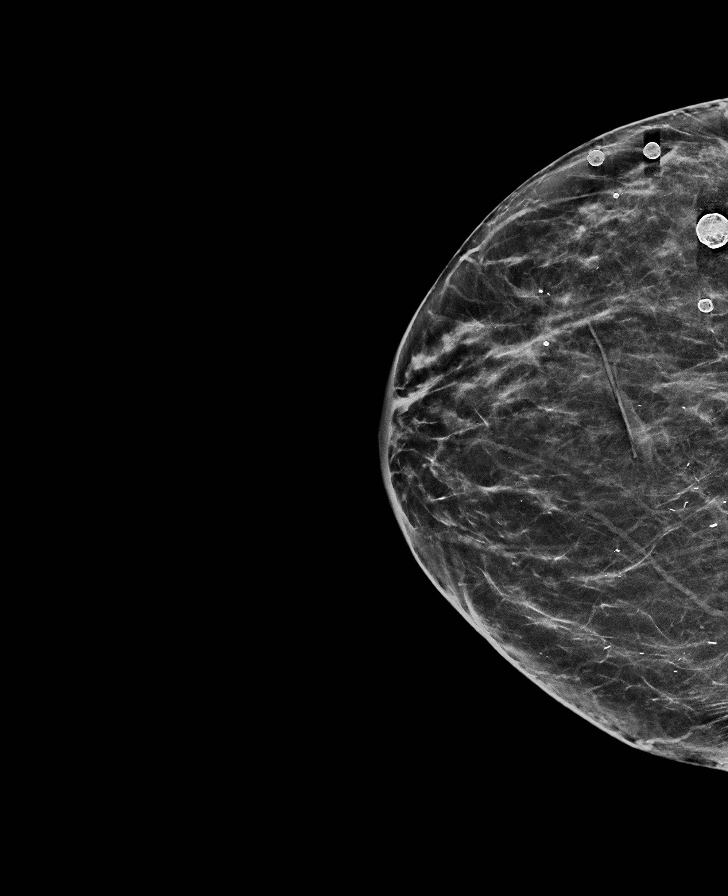

[R MLO synth-2D]
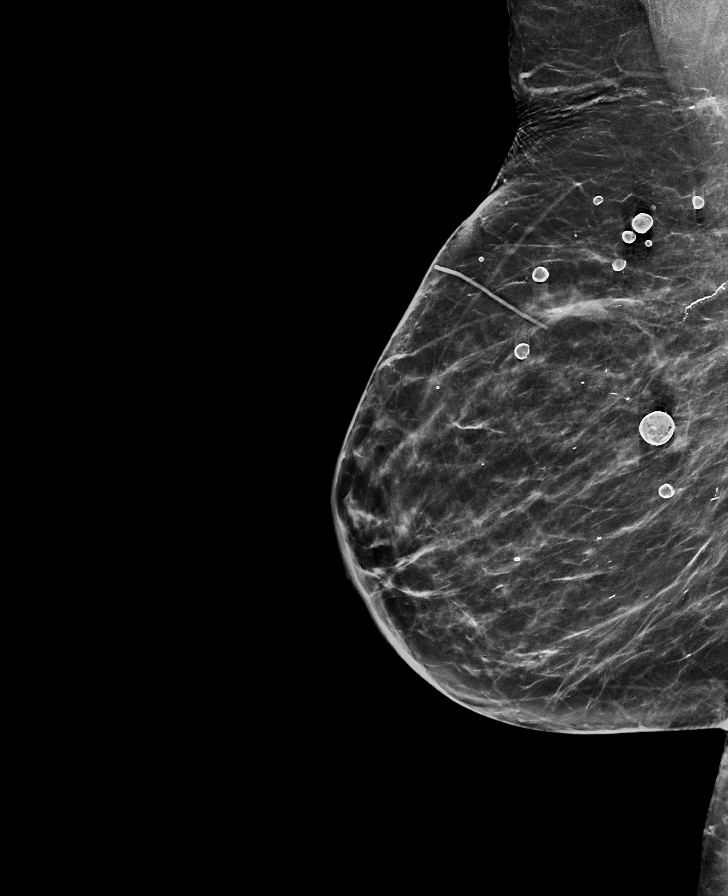

[L CC synth-2D]
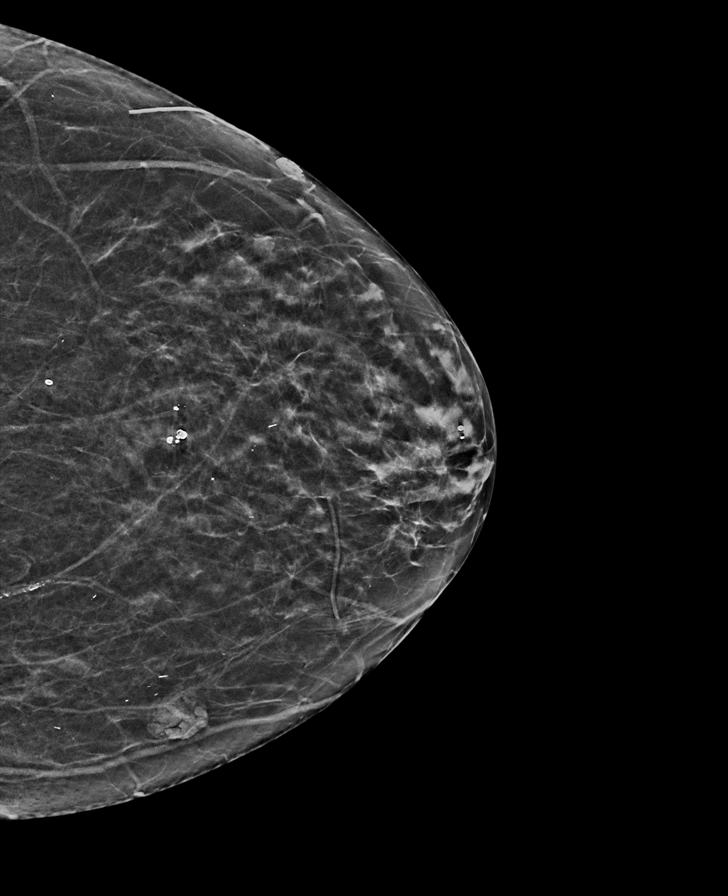

[L CC tomo · tomo slice 27/53.0]
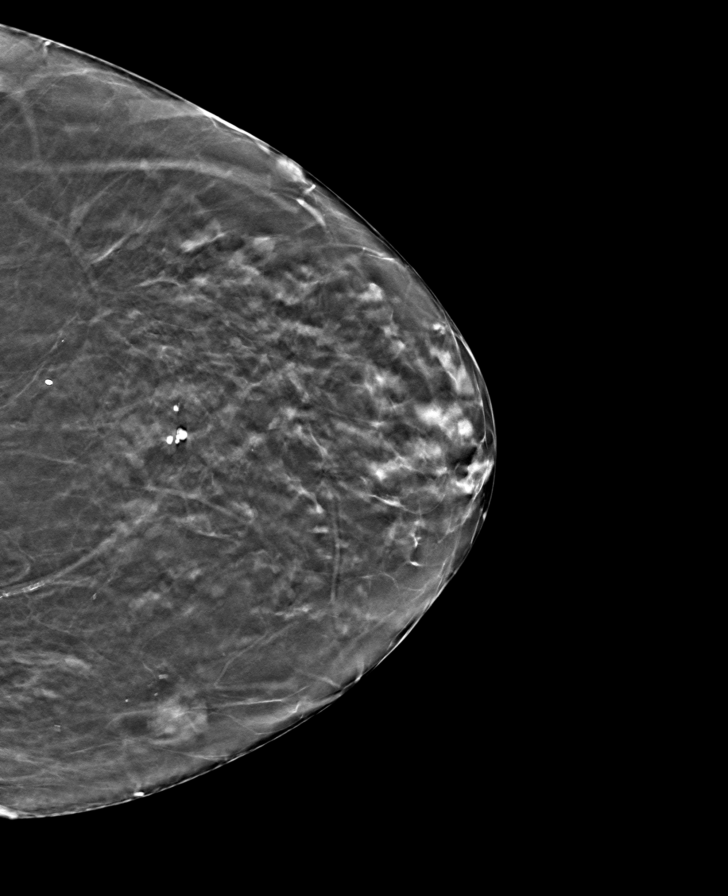

[R CC tomo · tomo slice 33/65.0]
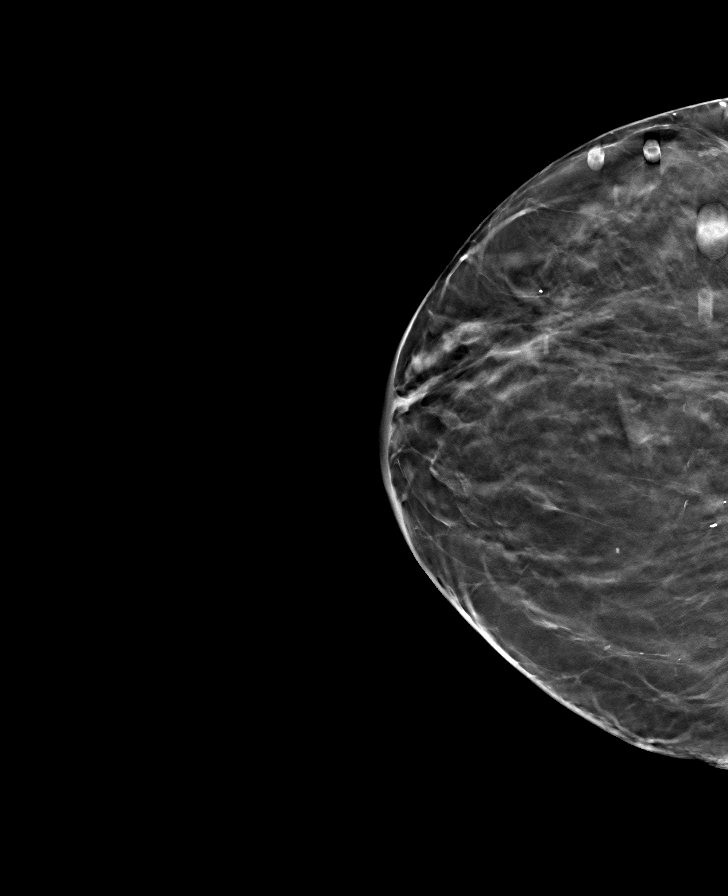

[L MLO tomo · tomo slice 33/65.0]
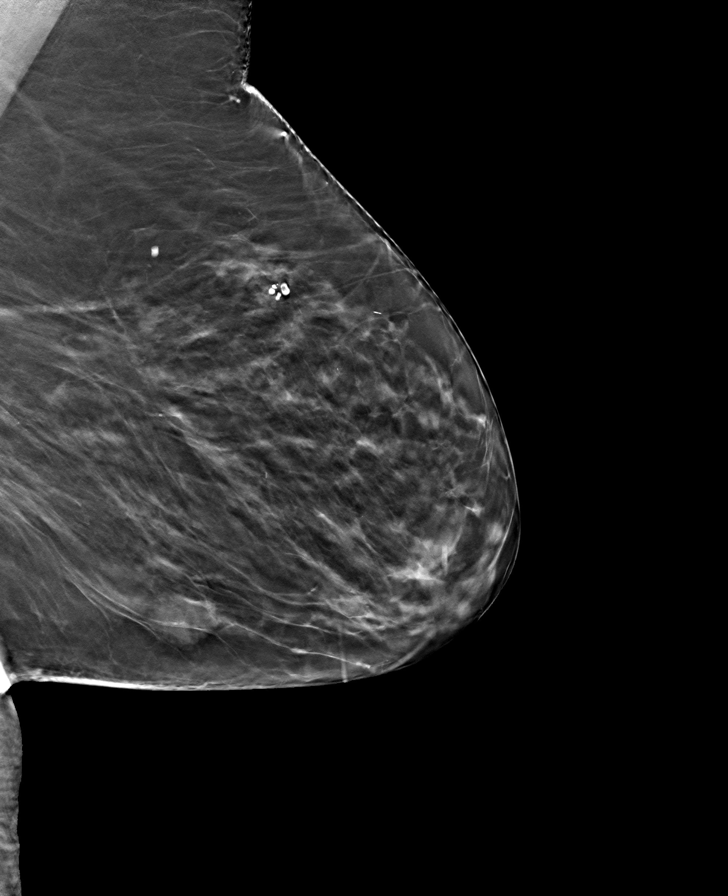

[R MLO tomo · tomo slice 37/72.0]
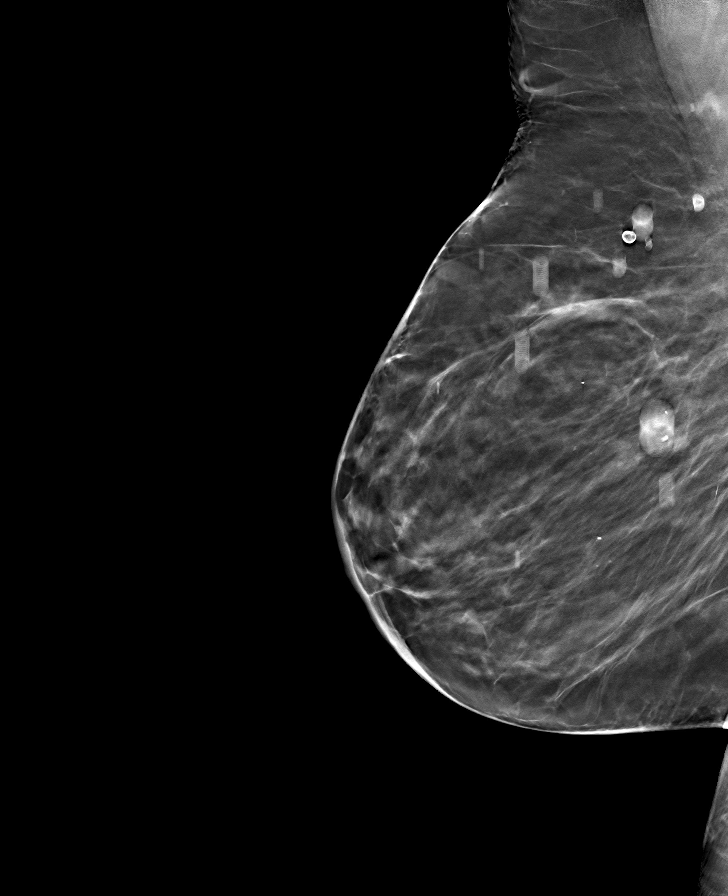

[8 of 24 positions shown; findings below may reference images not displayed]

ACR Breast Density Category b: There are scattered areas of
fibroglandular density.
FINDINGS: There are no findings suspicious for malignancy. There is density
and architectural distortion within the RIGHT breast, consistent
with postsurgical changes. These are stable in comparison to prior.
Stable LEFT breast postsurgical changes. The images were evaluated
with computer-aided detection.
IMPRESSION: No mammographic evidence of malignancy. A result letter of this
screening mammogram will be mailed directly to the patient.

RECOMMENDATION:
Screening mammogram in one year as clinically indicated.
(Code:P3-6-XKI)

BI-RADS CATEGORY  2: Benign.

## 2021-06-11 ENCOUNTER — Ambulatory Visit: Payer: Self-pay | Admitting: *Deleted

## 2021-06-11 ENCOUNTER — Other Ambulatory Visit: Payer: Self-pay

## 2021-06-11 ENCOUNTER — Ambulatory Visit
Admission: EM | Admit: 2021-06-11 | Discharge: 2021-06-11 | Disposition: A | Discharge status: Home / Self Care | Payer: Medicare HMO | Attending: Emergency Medicine | Admitting: Emergency Medicine

## 2021-06-11 ENCOUNTER — Encounter: Payer: Self-pay | Admitting: Emergency Medicine

## 2021-06-11 DIAGNOSIS — R300 Dysuria: Secondary | ICD-10-CM

## 2021-06-11 MED ORDER — CEPHALEXIN 500 MG PO CAPS: 500.0000 mg | ORAL_CAPSULE | Freq: Two times a day (BID) | ORAL | 0 refills | Status: AC

## 2021-06-11 NOTE — Telephone Encounter (Signed)
Summary: UTI with lots of burning little output   Patient called in to get an appointment with Dr [name] say that when she goes to the restroom to urinate it is very painful or maybe only a few drops come out and it is very painful starting today. Before she was not making it to the bathroom without an accident. Asking for medication sent to pharmacy but appt set for tomorrow morning Ph# [phone]      Reason for Disposition  [1] SEVERE pain with urination (e.g., excruciating) AND [2] not improved after 2 hours of pain medicine and Sitz bath  Answer Assessment - Initial Assessment Questions 1. SEVERITY: "How bad is the pain?"  (e.g., Scale 1-10; mild, moderate, or severe)   - MILD (1-3): complains slightly about urination hurting   - MODERATE (4-7): interferes with normal activities     - SEVERE (8-10): excruciating, unwilling or unable to urinate because of the pain      severe 2. FREQUENCY: "How many times have you had painful urination today?"      Every time 3. PATTERN: "Is pain present every time you urinate or just sometimes?"      Every time 4. ONSET: "When did the painful urination start?"      Pain started today 5. FEVER: "Do you have a fever?" If Yes, ask: "What is your temperature, how was it measured, and when did it start?"     no 6. PAST UTI: "Have you had a urine infection before?" If Yes, ask: "When was the last time?" and "What happened that time?"      Yes- with last hospitalization 7. CAUSE: "What do you think is causing the painful urination?"  (e.g., UTI, scratch, Herpes sore)     UTI 8. OTHER SYMPTOMS: "Do you have any other symptoms?" (e.g., flank pain, vaginal discharge, genital sores, urgency, blood in urine)     Not emptying completely- small amounts 9. PREGNANCY: "Is there any chance you are pregnant?" "When was your last menstrual period?"     *No Answer*  Protocols used: Urination Pain - Female-A-AH

## 2021-06-11 NOTE — Telephone Encounter (Signed)
°  Chief Complaint: pain with urination,  Symptoms: urgency, small amounts Frequency: pain started today Pertinent Negatives: Patient denies fever Disposition: [] ED /[x] Urgent Care (no appt availability in office) / [] Appointment(In office/virtual)/ []  [facility]/ [] Home Care/ [] Refused Recommended Disposition /[] [facility]/ []  Follow-up with PCP Additional Notes: Patient states she does not think she can wait until tomorrow- call to office- no open appointment- advised UC. Patient states she will have her son drive her.

## 2021-06-11 NOTE — ED Provider Notes (Addendum)
In [place]    CSN: [id number] Arrival date & time: 06/11/21  1507      History   Chief Complaint Chief Complaint  Patient presents with   Dysuria    HPI Kirsten Snyder is a 88 y.o. female.   Patient presents with dysuria, urgency and station of incomplete bladder emptying beginning this morning.  Endorses centralized lower back pain 1 day ago which has resolved.  Has not attempted treatment of symptoms.  Notify PCP of symptoms who recommended urgent care for evaluation.  History of UTI, sepsis related to UTI .wearing depends.    Past Medical History:  Diagnosis Date   Allergy    Asthma    Breast cancer ([place])    Cancer ([place]) 2012   right breast cancer lumpectomy with rad tx   Depression    Edema    GERD (gastroesophageal reflux disease)    Gout    Hyperlipidemia    Hypertension    Personal history of radiation therapy     Patient Active Problem List   Diagnosis Date Noted   Anxiety 03/04/2021   COPD (chronic obstructive pulmonary disease) ([place]) 02/12/2021   H/O total hysterectomy 08/05/2020   UTI (urinary tract infection) 02/27/2020   Fall at home, initial encounter 02/27/2020   S/P resection of meningioma 10/31/2019   Pulmonary emboli ([place]) 10/31/2019   Sepsis secondary to UTI ([place]) 10/01/2019   Sepsis ([place]) 10/01/2019   Hypokalemia 10/01/2019   Urinary urgency 09/15/2019   Atherosclerosis of abdominal aorta ([place]) 10/12/2018   SOBOE (shortness of breath on exertion) 10/12/2018   Osteopenia of neck of right femur 06/29/2018   Breast cancer, right ([place]) 06/17/2016   Essential hypertension 12/13/2015   Breast abscess of female 11/07/2015   Familial multiple lipoprotein-type hyperlipidemia 10/25/2014   Clinical depression 10/25/2014   Acid reflux 10/25/2014   Acute gastrointestinal bleeding 10/25/2014   Aggrieved 10/25/2014   Airway hyperreactivity 10/25/2014   Arthritis of knee 10/25/2014   H/O: gout 10/25/2014   Adiposity 10/25/2014    Arthritis, degenerative 10/25/2014   H/O adenomatous polyp of colon 03/07/2014    Past Surgical History:  Procedure Laterality Date   BREAST BIOPSY Bilateral    negative   BREAST EXCISIONAL BIOPSY Right    right positive 04/2010   BREAST LUMPECTOMY Right    removed a cancerous lump   VAGINAL HYSTERECTOMY      OB History   No obstetric history on file.      Home Medications    Prior to Admission medications   Medication Sig Start Date End Date Taking? Authorizing Provider  albuterol (VENTOLIN HFA) 108 (90 Base) MCG/ACT inhaler Inhale 2 puffs into the lungs every 6 (six) hours as needed for wheezing or shortness of breath. Include aerochamber 01/14/21  Yes [name], MD  allopurinol (ZYLOPRIM) 100 MG tablet TAKE 1 TABLET EVERY DAY 01/14/21  Yes [name], MD  calcium carbonate (TUMS - DOSED IN MG ELEMENTAL CALCIUM) 500 MG chewable tablet Chew 1 tablet by mouth every 8 (eight) hours as needed for indigestion.   Yes [provider]  cetirizine (ZYRTEC) 10 MG tablet Take 10 mg by mouth daily. For allergies.   Yes [provider]  ferrous sulfate 325 (65 FE) MG tablet Take 325 mg by mouth daily with breakfast.   Yes [provider]  Fluticasone-Umeclidin-Vilant (TRELEGY ELLIPTA) 100-62.5-25 MCG/INH AEPB Inhale 1 puff into the lungs daily. 04/11/19  Yes [name], MD  losartan (  COZAAR) 25 MG tablet TAKE (1) TABLET BY MOUTH EVERY DAY 01/14/21  Yes [name], MD  lovastatin (MEVACOR) 40 MG tablet TAKE 1 TABLET EVERY DAY AT BEDTIME 01/14/21  Yes [name], MD  meclizine (ANTIVERT) 12.5 MG tablet Take 1 tablet (12.5 mg total) by mouth 3 (three) times daily as needed for dizziness. 02/09/20  Yes [name], MD  Melatonin 3 MG TBDP Take 3 mg by mouth at bedtime. 03/04/21 03/04/22 Yes [provider]  montelukast (SINGULAIR) 10 MG tablet Take 1 tablet (10 mg total) by mouth at bedtime. 01/14/21  Yes [name], MD  omeprazole  (PRILOSEC) 20 MG capsule One a day 01/14/21  Yes [name], MD  sertraline (ZOLOFT) 100 MG tablet Take 100 mg by mouth daily. 05/19/21  Yes [provider]  traZODone (DESYREL) 50 MG tablet TAKE 1 TABLET EVERY DAY AT BEDTIME 01/14/21  Yes [name], MD  apixaban (ELIQUIS) 5 MG TABS tablet TAKE (1) TABLET BY MOUTH TWICE DAILY 03/18/21   Glean Hess, MD  mupirocin ointment (BACTROBAN) 2 % Apply 1 application topically 2 (two) times daily. 04/09/20   [name], MD  nystatin cream (MYCOSTATIN) Apply 1 application topically 2 (two) times daily. 04/09/20   [name], MD  polyethylene glycol (MIRALAX / GLYCOLAX) 17 g packet Take 17 g by mouth daily as needed. 03/01/20   Alma Friendly, MD    Family History Family History  Problem Relation Age of Onset   Leukemia Mother    Heart disease Father    Breast cancer Neg Hx     Social History Social History   Tobacco Use   Smoking status: Former    Packs/day: 0.25    Years: 2.00    Pack years: 0.50    Types: Cigarettes   Smokeless tobacco: Never   Tobacco comments:    smoking cessation materials not required  Vaping Use   Vaping Use: Never used  Substance Use Topics   Alcohol use: Not Currently   Drug use: Never     Allergies   Sulfa antibiotics   Review of Systems Review of Systems  Genitourinary:  Positive for dysuria. Negative for decreased urine volume, difficulty urinating, dyspareunia, enuresis, flank pain, frequency, genital sores, hematuria, menstrual problem, pelvic pain, urgency, vaginal bleeding, vaginal discharge and vaginal pain.  Musculoskeletal:  Positive for back pain. Negative for arthralgias, gait problem, joint swelling, myalgias, neck pain and neck stiffness.    Physical Exam Triage Vital Signs ED Triage Vitals  Enc Vitals Group     BP 06/11/21 1526 113/61     Pulse Rate 06/11/21 1526 90     Resp 06/11/21 1526 18     Temp 06/11/21 1526 98.1 F (36.7 C)     Temp  Source 06/11/21 1526 Oral     SpO2 06/11/21 1526 95 %     Weight 06/11/21 1521 223 lb 15.8 oz (101.6 kg)     Height 06/11/21 1521 5\' 3"  (1.6 m)     Head Circumference --      Peak Flow --      Pain Score 06/11/21 1521 0     Pain Loc --      Pain Edu? --      Excl. in [place]? --    No data found.  Updated Vital Signs BP 113/61 (BP Location: Right Arm)    Pulse 90    Temp 98.1 F (36.7 C) (Oral)  Resp 18    Ht 5\' 3"  (1.6 m)    Wt 223 lb 15.8 oz (101.6 kg)    SpO2 95%    BMI 39.68 kg/m   Visual Acuity Right Eye Distance:   Left Eye Distance:   Bilateral Distance:    Right Eye Near:   Left Eye Near:    Bilateral Near:     Physical Exam Constitutional:      Appearance: Normal appearance.  HENT:     Head: Normocephalic.  Eyes:     Extraocular Movements: Extraocular movements intact.  Pulmonary:     Effort: Pulmonary effort is normal.  Abdominal:     General: Abdomen is flat. Bowel sounds are normal.     Palpations: Abdomen is soft.     Tenderness: There is no abdominal tenderness. There is left CVA tenderness. There is no right CVA tenderness.  Skin:    General: Skin is warm and dry.  Neurological:     Mental Status: She is alert and oriented to person, place, and time. Mental status is at baseline.  Psychiatric:        Mood and Affect: Mood normal.        Behavior: Behavior normal.     UC Treatments / Results  Labs (all labs ordered are listed, but only abnormal results are displayed) Labs Reviewed  URINALYSIS, [place] MICROSCOPIC    EKG   Radiology No results found.  Procedures Procedures (including critical care time)  Medications Ordered in UC Medications - No data to display  Initial Impression / Assessment and Plan / UC Course  I have reviewed the triage vital signs and the nursing notes.  Pertinent labs & imaging results that were available during my care of the patient were reviewed by me and considered in my medical decision making  (see chart for details).  Dysuria  Vital signs are stable, patient is in no signs of distress, okay for outpatient treatment patient endorses that she is unable to give sample today due to dribbling, will move forward with treatment based on medical history, Keflex 5-day course prescribed as patient has had this medication in the past, advised increase fluid intake and good feminine hygiene, given strict precautions that if no improvement seen that she is to follow-up with PCP or urgent care for worsening symptoms she is to go to the emergency department for evaluation Final Clinical Impressions(s) / UC Diagnoses   Final diagnoses:  None   Discharge Instructions   None    ED Prescriptions   None    PDMP not reviewed this encounter.   Hans Eden, NP 06/11/21 1603    Hans Eden, NP 06/11/21 [phone]

## 2021-06-11 NOTE — Discharge Instructions (Signed)
Today you are being treated for a urinary infection based on your symptoms and your medical history  Take Keflex twice a day for the next 5 days  Unfortunately we were unable to obtain a sample today so we will not be able to determine which bacteria is present   If symptoms do not improve please follow-up with them urgent care or your primary care doctor for further evaluation, if your symptoms worsen please go to the nearest emergency department for further evaluation  You may increase your water intake to further help flush your kidneys and bladder  As always make sure that you are practicing good feminine hygiene, wiping front to back, using unscented products and keeping area dry and free from moisture

## 2021-06-11 NOTE — ED Triage Notes (Addendum)
Pt c/o dysuria, urinary retention. Started this morning. Denies fever. Pt states she does not think she can give a urine specimen because she has to wear a diaper and cant get to the restroom fast enough.

## 2021-06-12 ENCOUNTER — Ambulatory Visit: Payer: Medicare HMO | Admitting: Family Medicine

## 2021-06-23 ENCOUNTER — Other Ambulatory Visit: Payer: Self-pay | Admitting: Family Medicine

## 2021-07-14 ENCOUNTER — Ambulatory Visit (INDEPENDENT_AMBULATORY_CARE_PROVIDER_SITE_OTHER): Payer: Medicare HMO | Admitting: Family Medicine

## 2021-07-14 ENCOUNTER — Encounter: Payer: Self-pay | Admitting: Family Medicine

## 2021-07-14 ENCOUNTER — Other Ambulatory Visit: Payer: Self-pay

## 2021-07-14 VITALS — BP 100/60 | HR 98 | Ht 63.0 in | Wt 213.0 lb

## 2021-07-14 DIAGNOSIS — E790 Hyperuricemia without signs of inflammatory arthritis and tophaceous disease: Secondary | ICD-10-CM

## 2021-07-14 DIAGNOSIS — E7849 Other hyperlipidemia: Secondary | ICD-10-CM

## 2021-07-14 DIAGNOSIS — I10 Essential (primary) hypertension: Secondary | ICD-10-CM | POA: Diagnosis not present

## 2021-07-14 DIAGNOSIS — J4520 Mild intermittent asthma, uncomplicated: Secondary | ICD-10-CM

## 2021-07-14 DIAGNOSIS — F5101 Primary insomnia: Secondary | ICD-10-CM | POA: Diagnosis not present

## 2021-07-14 DIAGNOSIS — K219 Gastro-esophageal reflux disease without esophagitis: Secondary | ICD-10-CM | POA: Diagnosis not present

## 2021-07-14 MED ORDER — ALBUTEROL SULFATE HFA 108 (90 BASE) MCG/ACT IN AERS: 2.0000 | INHALATION_SPRAY | Freq: Four times a day (QID) | RESPIRATORY_TRACT | 1 refills | Status: DC | PRN

## 2021-07-14 MED ORDER — TRAZODONE HCL 50 MG PO TABS: ORAL_TABLET | ORAL | 1 refills | Status: DC

## 2021-07-14 MED ORDER — LOVASTATIN 40 MG PO TABS: ORAL_TABLET | ORAL | 1 refills | Status: DC

## 2021-07-14 MED ORDER — OMEPRAZOLE 20 MG PO CPDR: DELAYED_RELEASE_CAPSULE | ORAL | 1 refills | Status: DC

## 2021-07-14 MED ORDER — MONTELUKAST SODIUM 10 MG PO TABS: 10.0000 mg | ORAL_TABLET | Freq: Every day | ORAL | 3 refills | Status: DC

## 2021-07-14 MED ORDER — ALLOPURINOL 100 MG PO TABS: ORAL_TABLET | ORAL | 1 refills | Status: DC

## 2021-07-14 MED ORDER — LOSARTAN POTASSIUM 25 MG PO TABS: ORAL_TABLET | ORAL | 1 refills | Status: DC

## 2021-07-14 NOTE — Progress Notes (Signed)
? ? ?Date:  07/14/2021  ? ?Name:  Kirsten Snyder   DOB:  23-Nov-1933   MRN:  [id number] ? ? ?Chief Complaint: Insomnia, Gastroesophageal Reflux, Allergic Rhinitis , Hyperlipidemia, Hypertension, and Gout ? ?Insomnia ?Primary symptoms: difficulty falling asleep, no malaise/fatigue.   ?The problem has been gradually improving since onset. The symptoms are relieved by medication. The treatment provided moderate relief. PMH includes: no hypertension, no restless leg syndrome.   ?Gastroesophageal Reflux ?She reports no abdominal pain, no belching, no chest pain, no choking, no coughing, no dysphagia, no early satiety, no globus sensation, no heartburn, no hoarse voice, no nausea, no sore throat, no stridor, no tooth decay, no water brash or no wheezing. This is a recurrent problem. The current episode started in the past 7 days. The problem has been gradually improving. The symptoms are aggravated by certain foods. Pertinent negatives include no anemia, fatigue, melena, muscle weakness, orthopnea or weight loss. She has tried a PPI for the symptoms. The treatment provided moderate relief.  ?Hyperlipidemia ?This is a chronic problem. The current episode started more than 1 year ago. The problem is controlled. Recent lipid tests were reviewed and are normal. She has no history of chronic renal disease, diabetes, hypothyroidism, liver disease, obesity or nephrotic syndrome. Pertinent negatives include no chest pain, myalgias or shortness of breath. Current antihyperlipidemic treatment includes statins. The current treatment provides moderate improvement of lipids.  ?Hypertension ?This is a chronic problem. The current episode started more than 1 year ago. The problem has been waxing and waning since onset. Pertinent negatives include no anxiety, blurred vision, chest pain, headaches, malaise/fatigue, neck pain, orthopnea, palpitations, peripheral edema, PND, shortness of breath or sweats. Past treatments include angiotensin  blockers. There are no compliance problems.  There is no history of angina, kidney disease, CAD/MI, CVA, heart failure, left ventricular hypertrophy, PVD or retinopathy. There is no history of chronic renal disease, a hypertension causing med or renovascular disease.  ?Asthma ?There is no cough, hoarse voice, shortness of breath or wheezing. Pertinent negatives include no chest pain, ear pain, fever, headaches, heartburn, malaise/fatigue, myalgias, orthopnea, PND, sore throat, sweats or weight loss. Her past medical history is significant for asthma.  ? ?Lab Results  ?Component Value Date  ? NA 144 01/14/2021  ? K 4.3 01/14/2021  ? CO2 27 01/14/2021  ? GLUCOSE 114 (H) 01/14/2021  ? BUN 15 01/14/2021  ? CREATININE 0.93 01/14/2021  ? CALCIUM 9.3 01/14/2021  ? EGFR 60 01/14/2021  ? GFRNONAA >60 03/01/2020  ? ?Lab Results  ?Component Value Date  ? CHOL 221 (H) 01/14/2021  ? HDL 41 01/14/2021  ? LDLCALC 143 (H) 01/14/2021  ? TRIG 202 (H) 01/14/2021  ? CHOLHDL 4.8 (H) 11/02/2017  ? ?Lab Results  ?Component Value Date  ? TSH 2.39 09/15/2012  ? ?No results found for: HGBA1C ?Lab Results  ?Component Value Date  ? WBC 5.6 01/14/2021  ? HGB 13.4 01/14/2021  ? HCT 38.7 01/14/2021  ? MCV 90 01/14/2021  ? PLT 299 01/14/2021  ? ?Lab Results  ?Component Value Date  ? ALT 12 01/14/2021  ? AST 20 01/14/2021  ? ALKPHOS 65 01/14/2021  ? BILITOT 0.9 01/14/2021  ? ?No results found for: 25OHVITD2, [place], VD25OH  ? ?Review of Systems  ?Constitutional:  Negative for chills, fatigue, fever, malaise/fatigue and weight loss.  ?HENT:  Negative for drooling, ear discharge, ear pain, hoarse voice and sore throat.   ?Eyes:  Negative for blurred vision.  ?Respiratory:  Negative  for cough, choking, shortness of breath and wheezing.   ?Cardiovascular:  Negative for chest pain, palpitations, orthopnea, leg swelling and PND.  ?Gastrointestinal:  Negative for abdominal pain, blood in stool, constipation, diarrhea, dysphagia, heartburn, melena and  nausea.  ?Endocrine: Negative for polydipsia.  ?Genitourinary:  Negative for dysuria, frequency, hematuria and urgency.  ?Musculoskeletal:  Negative for back pain, myalgias, muscle weakness and neck pain.  ?Skin:  Negative for rash.  ?Allergic/Immunologic: Negative for environmental allergies.  ?Neurological:  Negative for dizziness and headaches.  ?Hematological:  Does not bruise/bleed easily.  ?Psychiatric/Behavioral:  Negative for suicidal ideas. The patient has insomnia. The patient is not nervous/anxious.   ? ?Patient Active Problem List  ? Diagnosis Date Noted  ? Anxiety 03/04/2021  ? COPD (chronic obstructive pulmonary disease) ([place]) 02/12/2021  ? H/O total hysterectomy 08/05/2020  ? UTI (urinary tract infection) 02/27/2020  ? Fall at home, initial encounter 02/27/2020  ? S/P resection of meningioma 10/31/2019  ? Pulmonary emboli ([place]) 10/31/2019  ? Sepsis secondary to UTI ([place]) 10/01/2019  ? Sepsis ([place]) 10/01/2019  ? Hypokalemia 10/01/2019  ? Urinary urgency 09/15/2019  ? Atherosclerosis of abdominal aorta ([place]) 10/12/2018  ? SOBOE (shortness of breath on exertion) 10/12/2018  ? Osteopenia of neck of right femur 06/29/2018  ? Breast cancer, right ([facility]) 06/17/2016  ? Essential hypertension 12/13/2015  ? Breast abscess of female 11/07/2015  ? Familial multiple lipoprotein-type hyperlipidemia 10/25/2014  ? Clinical depression 10/25/2014  ? Acid reflux 10/25/2014  ? Acute gastrointestinal bleeding 10/25/2014  ? Aggrieved 10/25/2014  ? Airway hyperreactivity 10/25/2014  ? Arthritis of knee 10/25/2014  ? H/O: gout 10/25/2014  ? Adiposity 10/25/2014  ? Arthritis, degenerative 10/25/2014  ? H/O adenomatous polyp of colon 03/07/2014  ? ? ?Allergies  ?Allergen Reactions  ? Sulfa Antibiotics   ? ? ?Past Surgical History:  ?Procedure Laterality Date  ? BREAST BIOPSY Bilateral   ? negative  ? BREAST EXCISIONAL BIOPSY Right   ? right positive 04/2010  ? BREAST LUMPECTOMY Right   ? removed a cancerous lump  ? VAGINAL  HYSTERECTOMY    ? ? ?Social History  ? ?Tobacco Use  ? Smoking status: Former  ?  Packs/day: 0.25  ?  Years: 2.00  ?  Pack years: 0.50  ?  Types: Cigarettes  ? Smokeless tobacco: Never  ? Tobacco comments:  ?  smoking cessation materials not required  ?Vaping Use  ? Vaping Use: Never used  ?Substance Use Topics  ? Alcohol use: Not Currently  ? Drug use: Never  ? ? ? ?Medication list has been reviewed and updated. ? ?No outpatient medications have been marked as taking for the 07/14/21 encounter (Office Visit) with [name], MD.  ? ? ? ?  07/14/2021  ?  9:42 AM 05/21/2021  ?  2:06 PM 03/12/2021  ? 11:15 AM 01/14/2021  ? 10:40 AM  ?PHQ 2/9 Scores  ?PHQ - 2 Score 0 _0 ?PHQ- 9 Score 0 _1 ? ? ? ?  07/14/2021  ?  9:42 AM 03/12/2021  ? 11:15 AM 10/01/2020  ?  2:41 PM 02/23/2020  ? 10:15 AM  ?GAD 7 : Generalized Anxiety Score  ?Nervous, Anxious, on Edge 0 3 0 0  ?Control/stop worrying 0 3 0 0  ?Worry too much - different things 0 3 0 0  ?Trouble relaxing 0 3 0 0  ?Restless 0 0 0 0  ?Easily annoyed or irritable 0 2 0 0  ?  Afraid - awful might happen 0 0 0 0  ?Total GAD 7 Score 0 14 0 0  ?Anxiety Difficulty Not difficult at all Somewhat difficult  Not difficult at all  ? ? ?BP Readings from Last 3 Encounters:  ?07/14/21 100/60  ?06/11/21 113/61  ?03/12/21 104/72  ? ? ?Physical Exam ?Vitals and nursing note reviewed.  ?Constitutional:   ?   Appearance: She is well-developed.  ?HENT:  ?   Head: Normocephalic.  ?   Right Ear: Tympanic membrane, ear canal and external ear normal.  ?   Left Ear: Tympanic membrane, ear canal and external ear normal.  ?   Nose: Nose normal. No congestion.  ?Eyes:  ?   General: Lids are everted, no foreign bodies appreciated. No scleral icterus.    ?   Left eye: No foreign body or hordeolum.  ?   Conjunctiva/sclera: Conjunctivae normal.  ?   Right eye: Right conjunctiva is not injected.  ?   Left eye: Left conjunctiva is not injected.  ?   Pupils: Pupils are equal, round, and reactive to  light.  ?Neck:  ?   Thyroid: No thyromegaly.  ?   Vascular: No JVD.  ?   Trachea: No tracheal deviation.  ?Cardiovascular:  ?   Rate and Rhythm: Normal rate and regular rhythm.  ?   Heart sounds: Normal heart sou

## 2021-07-15 LAB — LIPID PANEL WITH LDL/HDL RATIO
Cholesterol, Total: 278 mg/dL — ABNORMAL HIGH (ref 100–199)
HDL: 43 mg/dL (ref >39)
LDL Chol Calc (NIH): 203 mg/dL — ABNORMAL HIGH (ref 0–99)
LDL/HDL Ratio: 4.7 ratio — ABNORMAL HIGH (ref 0.0–3.2)
Triglycerides: 171 mg/dL — ABNORMAL HIGH (ref 0–149)
VLDL Cholesterol Cal: 32 mg/dL (ref 5–40)

## 2021-07-15 LAB — RENAL FUNCTION PANEL
Albumin: 4.1 g/dL (ref 3.6–4.6)
BUN/Creatinine Ratio: 13 (ref 12–28)
BUN: 11 mg/dL (ref 8–27)
CO2: 26 mmol/L (ref 20–29)
Calcium: 9 mg/dL (ref 8.7–10.3)
Chloride: 102 mmol/L (ref 96–106)
Creatinine, Ser: 0.84 mg/dL (ref 0.57–1.00)
Glucose: 130 mg/dL — ABNORMAL HIGH (ref 70–99)
Phosphorus: 4 mg/dL (ref 3.0–4.3)
Potassium: 4.7 mmol/L (ref 3.5–5.2)
Sodium: 142 mmol/L (ref 134–144)
eGFR: 68 mL/min/{1.73_m2} (ref >59)

## 2021-07-31 ENCOUNTER — Telehealth: Payer: Self-pay | Admitting: Family Medicine

## 2021-07-31 ENCOUNTER — Other Ambulatory Visit: Payer: Self-pay

## 2021-07-31 ENCOUNTER — Telehealth: Payer: Self-pay

## 2021-07-31 DIAGNOSIS — Z1231 Encounter for screening mammogram for malignant neoplasm of breast: Secondary | ICD-10-CM

## 2021-07-31 DIAGNOSIS — N644 Mastodynia: Secondary | ICD-10-CM

## 2021-07-31 NOTE — Progress Notes (Unsigned)
Ordered and scheduled mammo and [place] ?

## 2021-07-31 NOTE — Telephone Encounter (Signed)
Copied from [facility] [id number]. Topic: General - Other ?>> Jul 31, 2021 12:53 PM Pawlus, [name] wrote: ?Reason for CRM: Pt requested to speak directly with [name], pt stated there has been a mix up regarding her appointments, please advise. ?

## 2021-07-31 NOTE — Telephone Encounter (Signed)
Called [name] and [name]- no answer. So I called Kirsten Snyder with mammo info- [facility] in [place]. Mammo on 08/19/21 @ 220 with [place] to follow. He will pass the message on ?

## 2021-08-06 ENCOUNTER — Other Ambulatory Visit: Payer: Self-pay | Admitting: Family Medicine

## 2021-08-06 DIAGNOSIS — J4520 Mild intermittent asthma, uncomplicated: Secondary | ICD-10-CM

## 2021-08-06 NOTE — Telephone Encounter (Signed)
Dose inconsistent with current med list. ? ?Requested Prescriptions  ?Pending Prescriptions Disp Refills  ?? albuterol (VENTOLIN HFA) 108 (90 Base) MCG/ACT inhaler [Pharmacy Med Name: ALBUTEROL SULFATE HFA 108 (90 BASE)] 8.5 g   ?  Sig: INHALE 1 PUFF EVERY 6 HOURS AS NEEDED FOR COUGH  ?  ? Pulmonology:  [facility] 2 Passed - 08/06/2021  4:37 PM  ?  ?  Passed - Last BP in normal range  ?  BP Readings from Last 1 Encounters:  ?07/14/21 100/60  ?   ?  ?  Passed - Last Heart Rate in normal range  ?  Pulse Readings from Last 1 Encounters:  ?07/14/21 98  ?   ?  ?  Passed - Valid encounter within last 12 months  ?  Recent Outpatient Visits   ?      ? 3 weeks ago Familial multiple lipoprotein-type hyperlipidemia  ? [facility] [name], MD  ? 4 months ago Post-COVID syndrome resolved  ? [facility] [name], MD  ? 6 months ago Fever and chills  ? [facility] [name], MD  ? 6 months ago Essential hypertension  ? [facility] [name], MD  ? 10 months ago Mild intermittent asthma without complication  ? [facility] [name], MD  ?  ?  ?Future Appointments   ?        ? In 5 months [name], MD [facility] [place]  ?  ? ?  ?  ?  ? ? ? ?

## 2021-08-07 ENCOUNTER — Ambulatory Visit: Payer: Self-pay | Admitting: *Deleted

## 2021-08-07 ENCOUNTER — Other Ambulatory Visit: Payer: Self-pay | Admitting: Family Medicine

## 2021-08-07 DIAGNOSIS — J4520 Mild intermittent asthma, uncomplicated: Secondary | ICD-10-CM

## 2021-08-07 NOTE — Telephone Encounter (Signed)
Medication Refill - Medication:  ? ?albuterol (VENTOLIN HFA) 108 (90 Base) MCG/ACT inhaler ? ?Has the patient contacted their pharmacy? Yes.  Pt requested this Rx be re-sent in as soon as possible.  ?(Agent: If no, request that the patient contact the pharmacy for the refill. If patient does not wish to contact the pharmacy document the reason why and proceed with request.) ?(Agent: If yes, when and what did the pharmacy advise?) ? ?Preferred Pharmacy (with phone number or street name):  ? ?[facility] [place] [address]  ?[place] [address]  ?Phone: [phone] Fax: [phone]  ? ?Has the patient been seen for an appointment in the last year OR does the patient have an upcoming appointment? Yes.   ? ?Agent: Please be advised that RX refills may take up to 3 business days. We ask that you follow-up with your pharmacy. ? ?

## 2021-08-07 NOTE — Telephone Encounter (Signed)
Reason for Disposition ? [1] Prescription not at pharmacy AND [2] was prescribed by PCP recently  (Exception: triager has access to EMR and prescription is recorded there. Go to Home Care and confirm for pharmacy.) ? ?Answer Assessment - Initial Assessment Questions ?1. RESPIRATORY STATUS: "Describe your breathing?" (e.g., wheezing, shortness of breath, unable to speak, severe coughing)  ?    Wheezing- increased- patient is out of inhaler ?2. ONSET: "When did this breathing problem begin?"  ?    chronic ?3. PATTERN "Does the difficult breathing come and go, or has it been constant since it started?"  ?    *No Answer* ?4. SEVERITY: "How bad is your breathing?" (e.g., mild, moderate, severe)  ?  - MILD: No SOB at rest, mild SOB with walking, speaks normally in sentences, can lie down, no retractions, pulse < 100.  ?  - MODERATE: SOB at rest, SOB with minimal exertion and prefers to sit, cannot lie down flat, speaks in phrases, mild retractions, audible wheezing, pulse 100-120.  ?  - SEVERE: Very SOB at rest, speaks in single words, struggling to breathe, sitting hunched forward, retractions, pulse > 120  ?    *No Answer* ?5. RECURRENT SYMPTOM: "Have you had difficulty breathing before?" If Yes, ask: "When was the last time?" and "What happened that time?"  ?    *No Answer* ?6. CARDIAC HISTORY: "Do you have any history of heart disease?" (e.g., heart attack, angina, bypass surgery, angioplasty)  ?    *No Answer* ?7. LUNG HISTORY: "Do you have any history of lung disease?"  (e.g., pulmonary embolus, asthma, emphysema) ?    *No Answer* ?8. CAUSE: "What do you think is causing the breathing problem?"  ?    *No Answer* ?9. OTHER SYMPTOMS: "Do you have any other symptoms? (e.g., dizziness, runny nose, cough, chest pain, fever) ?    *No Answer* ?10. O2 SATURATION MONITOR:  "Do you use an oxygen saturation monitor (pulse oximeter) at home?" If Yes, "What is your reading (oxygen level) today?" "What is your usual oxygen  saturation reading?" (e.g., 95%) ?      *No Answer* ?11. PREGNANCY: "Is there any chance you are pregnant?" "When was your last menstrual period?" ?      *No Answer* ?12. TRAVEL: "Have you traveled out of the country in the last month?" (e.g., travel history, exposures) ?      *No Answer* ? ?Answer Assessment - Initial Assessment Questions ?1. DRUG NAME: "What medicine do you need to have refilled?" ?    Albuterol inhaler ?2. REFILLS REMAINING: "How many refills are remaining?" (Note: The label on the medicine or pill bottle will show how many refills are remaining. If there are no refills remaining, then a renewal may be needed.) ?    none ?3. EXPIRATION DATE: "What is the expiration date?" (Note: The label states when the prescription will expire, and thus can no longer be refilled.) ?    na ?4. PRESCRIBING HCP: "Who prescribed it?" Reason: If prescribed by specialist, call should be referred to that group. ?    PCP ?5. SYMPTOMS: "Do you have any symptoms?" ?    wheezing ? ?Patient's husband- [name] is calling- patient does not have RF on inhaler- in review of last Rx 07/10/21- Rx is marked as no print. Request to office sent to revise Rx so husband can pick up today and take to patient. ? ?Protocols used: Breathing Difficulty-A-AH, Medication Refill and Renewal Call-A-AH ? ?

## 2021-08-08 ENCOUNTER — Other Ambulatory Visit: Payer: Self-pay

## 2021-08-08 DIAGNOSIS — J4520 Mild intermittent asthma, uncomplicated: Secondary | ICD-10-CM

## 2021-08-08 MED ORDER — ALBUTEROL SULFATE HFA 108 (90 BASE) MCG/ACT IN AERS: 2.0000 | INHALATION_SPRAY | Freq: Four times a day (QID) | RESPIRATORY_TRACT | 1 refills | Status: DC | PRN

## 2021-08-08 NOTE — Telephone Encounter (Signed)
Refused duplicate refill request for albuterol 108 MCG/ACT inhaler because it was sent this morning to [facility] 08/08/2021 and received at 7:12 AM.    ?

## 2021-08-18 ENCOUNTER — Telehealth: Payer: Self-pay | Admitting: *Deleted

## 2021-08-18 NOTE — Telephone Encounter (Addendum)
Message on scheduling line from son asking for a return call. Patient last seen in 2020 and looks like follow up was not made at that time ? ?Follow-up and Disposition History ? ?08/03/2018 1622 - Kirsten Asal, MD  ?Check-out note: RN to call patient in 1 week to assess rash.  ?RTC in 6 months for MD assess, labs (CBC with diff, CMP, CA27.29).  ?Bilateral mammogram 06/05/2019.  ?-[facility]  ? ?

## 2021-08-19 ENCOUNTER — Inpatient Hospital Stay: Admission: RE | Admit: 2021-08-19 | Payer: Medicare HMO | Source: Ambulatory Visit

## 2021-08-19 ENCOUNTER — Other Ambulatory Visit: Payer: Medicare HMO

## 2021-09-02 ENCOUNTER — Inpatient Hospital Stay: Admission: RE | Admit: 2021-09-02 | Payer: Medicare HMO | Source: Ambulatory Visit

## 2021-09-02 ENCOUNTER — Other Ambulatory Visit: Payer: Medicare HMO

## 2021-10-20 ENCOUNTER — Other Ambulatory Visit: Payer: Self-pay | Admitting: Family Medicine

## 2021-10-20 DIAGNOSIS — E790 Hyperuricemia without signs of inflammatory arthritis and tophaceous disease: Secondary | ICD-10-CM

## 2021-10-22 NOTE — Telephone Encounter (Signed)
Requested Prescriptions  Pending Prescriptions Disp Refills  . allopurinol (ZYLOPRIM) 100 MG tablet [Pharmacy Med Name: ALLOPURINOL 100 MG TAB] 90 tablet 1    Sig: TAKE (1) TABLET BY MOUTH EVERY DAY     Endocrinology:  Gout Agents - allopurinol Failed - 10/20/2021  1:44 PM      Failed - Uric Acid in normal range and within 360 days    Uric Acid  Date Value Ref Range Status  07/26/2018 4.7 2.5 - 7.1 mg/dL Final    Comment:               Therapeutic target for gout patients: <6.0         Passed - Cr in normal range and within 360 days    Creatinine  Date Value Ref Range Status  10/19/2013 0.80 0.60 - 1.30 mg/dL Final   Creatinine, Ser  Date Value Ref Range Status  07/14/2021 0.84 0.57 - 1.00 mg/dL Final         Passed - Valid encounter within last 12 months    Recent Outpatient Visits          3 months ago Familial multiple lipoprotein-type hyperlipidemia   [facility] [name], MD   7 months ago Post-COVID syndrome resolved   [facility] [name], MD   8 months ago Fever and chills   [facility] [name], MD   9 months ago Essential hypertension   [facility] [name], MD   1 year ago Mild intermittent asthma without complication   [facility] [name], MD      Future Appointments            In 2 months [name], MD [facility]           Passed - CBC within normal limits and completed in the last 12 months    WBC  Date Value Ref Range Status  01/14/2021 5.6 3.4 - 10.8 x10E3/uL Final  03/01/2020 5.3 4.0 - 10.5 K/uL Final   RBC  Date Value Ref Range Status  01/14/2021 4.29 3.77 - 5.28 x10E6/uL Final  03/01/2020 3.49 (L) 3.87 - 5.11 MIL/uL Final   Hemoglobin  Date Value Ref Range Status  01/14/2021 13.4 11.1 - 15.9 g/dL Final   Hematocrit  Date Value Ref Range Status  01/14/2021 38.7 34.0 - 46.6 % Final   MCHC  Date Value Ref Range Status  01/14/2021  34.6 31.5 - 35.7 g/dL Final  03/01/2020 33.3 30.0 - 36.0 g/dL Final   [facility]  Date Value Ref Range Status  01/14/2021 31.2 26.6 - 33.0 pg Final  03/01/2020 31.5 26.0 - 34.0 pg Final   MCV  Date Value Ref Range Status  01/14/2021 90 79 - 97 fL Final  10/19/2013 96 80 - 100 fL Final   No results found for: "PLTCOUNTKUC", "LABPLAT", "POCPLA" RDW  Date Value Ref Range Status  01/14/2021 12.9 11.7 - 15.4 % Final  10/19/2013 13.5 11.5 - 14.5 % Final

## 2021-12-18 ENCOUNTER — Other Ambulatory Visit: Payer: Self-pay | Admitting: Family Medicine

## 2021-12-18 DIAGNOSIS — B379 Candidiasis, unspecified: Secondary | ICD-10-CM

## 2022-01-13 ENCOUNTER — Encounter: Payer: Self-pay | Admitting: Family Medicine

## 2022-01-13 ENCOUNTER — Ambulatory Visit (INDEPENDENT_AMBULATORY_CARE_PROVIDER_SITE_OTHER): Payer: Medicare HMO | Admitting: Family Medicine

## 2022-01-13 VITALS — BP 110/80 | HR 80 | Ht 63.0 in | Wt 215.0 lb

## 2022-01-13 DIAGNOSIS — E790 Hyperuricemia without signs of inflammatory arthritis and tophaceous disease: Secondary | ICD-10-CM | POA: Diagnosis not present

## 2022-01-13 DIAGNOSIS — J4520 Mild intermittent asthma, uncomplicated: Secondary | ICD-10-CM

## 2022-01-13 DIAGNOSIS — F5101 Primary insomnia: Secondary | ICD-10-CM

## 2022-01-13 DIAGNOSIS — E7849 Other hyperlipidemia: Secondary | ICD-10-CM

## 2022-01-13 DIAGNOSIS — R739 Hyperglycemia, unspecified: Secondary | ICD-10-CM | POA: Diagnosis not present

## 2022-01-13 DIAGNOSIS — I10 Essential (primary) hypertension: Secondary | ICD-10-CM

## 2022-01-13 DIAGNOSIS — K219 Gastro-esophageal reflux disease without esophagitis: Secondary | ICD-10-CM

## 2022-01-13 MED ORDER — TRELEGY ELLIPTA 100-62.5-25 MCG/ACT IN AEPB: 1.0000 | INHALATION_SPRAY | Freq: Every day | RESPIRATORY_TRACT | 11 refills | Status: AC

## 2022-01-13 MED ORDER — OMEPRAZOLE 20 MG PO CPDR: DELAYED_RELEASE_CAPSULE | ORAL | 1 refills | Status: DC

## 2022-01-13 MED ORDER — LOVASTATIN 40 MG PO TABS: ORAL_TABLET | ORAL | 1 refills | Status: DC

## 2022-01-13 MED ORDER — TRAZODONE HCL 50 MG PO TABS: ORAL_TABLET | ORAL | 1 refills | Status: DC

## 2022-01-13 MED ORDER — ALBUTEROL SULFATE HFA 108 (90 BASE) MCG/ACT IN AERS: 2.0000 | INHALATION_SPRAY | Freq: Four times a day (QID) | RESPIRATORY_TRACT | 1 refills | Status: DC | PRN

## 2022-01-13 MED ORDER — ALLOPURINOL 100 MG PO TABS: ORAL_TABLET | ORAL | 1 refills | Status: DC

## 2022-01-13 MED ORDER — MONTELUKAST SODIUM 10 MG PO TABS
10.0000 mg | ORAL_TABLET | Freq: Every day | ORAL | 1 refills | Status: DC
Start: 2022-01-13 — End: 2022-07-14

## 2022-01-13 MED ORDER — LOSARTAN POTASSIUM 25 MG PO TABS: ORAL_TABLET | ORAL | 1 refills | Status: DC

## 2022-01-13 NOTE — Progress Notes (Signed)
Date:  01/13/2022   Name:  Kirsten Snyder   DOB:  [birth year]   MRN:  [id number]   Chief Complaint: Insomnia, Gastroesophageal Reflux, Allergic Rhinitis , Hyperlipidemia, Hypertension, Gout, Hyperglycemia, and COPD  Insomnia Primary symptoms: no fragmented sleep, no sleep disturbance, no difficulty falling asleep.   The current episode started more than one year. The problem has been gradually improving since onset. The symptoms are relieved by medication. The treatment provided moderate relief.  Gastroesophageal Reflux She reports no abdominal pain, no belching, no coughing, no heartburn, no nausea or no wheezing. better. This is a chronic problem. The current episode started more than 1 year ago. The problem has been gradually improving. The symptoms are aggravated by certain foods. She has tried a PPI for the symptoms. The treatment provided moderate relief.  Hyperlipidemia This is a chronic problem. The current episode started more than 1 year ago. The problem is controlled. Recent lipid tests were reviewed and are normal. She has no history of chronic renal disease, diabetes, hypothyroidism, liver disease, obesity or nephrotic syndrome. Pertinent negatives include no myalgias or shortness of breath. Current antihyperlipidemic treatment includes statins. The current treatment provides moderate improvement of lipids.  Hypertension This is a chronic problem. The current episode started more than 1 year ago. The problem is controlled. Pertinent negatives include no headaches, neck pain, palpitations, PND or shortness of breath. Risk factors for coronary artery disease include dyslipidemia. Past treatments include angiotensin blockers. The current treatment provides moderate improvement. There are no compliance problems.  There is no history of angina, kidney disease, CAD/MI, CVA, heart failure, left ventricular hypertrophy, PVD or retinopathy. There is no history of chronic renal disease, a  hypertension causing med or renovascular disease.  Hyperglycemia This is a new problem. Pertinent negatives include no abdominal pain, chills, coughing, fever, headaches, myalgias, nausea, neck pain or rash.  COPD There is no chest tightness, cough, frequent throat clearing, shortness of breath or wheezing. The problem occurs intermittently. The problem has been gradually improving. Pertinent negatives include no ear pain, fever, headaches, heartburn, myalgias or PND. Her past medical history is significant for COPD.    Lab Results  Component Value Date   NA 142 07/14/2021   K 4.7 07/14/2021   CO2 26 07/14/2021   GLUCOSE 130 (H) 07/14/2021   BUN 11 07/14/2021   CREATININE 0.84 07/14/2021   CALCIUM 9.0 07/14/2021   EGFR 68 07/14/2021   GFRNONAA >60 03/01/2020   Lab Results  Component Value Date   CHOL 278 (H) 07/14/2021   HDL 43 07/14/2021   LDLCALC 203 (H) 07/14/2021   TRIG 171 (H) 07/14/2021   CHOLHDL 4.8 (H) 11/02/2017   Lab Results  Component Value Date   TSH 2.39 09/15/2012   No results found for: "HGBA1C" Lab Results  Component Value Date   WBC 5.6 01/14/2021   HGB 13.4 01/14/2021   HCT 38.7 01/14/2021   MCV 90 01/14/2021   PLT 299 01/14/2021   Lab Results  Component Value Date   ALT 12 01/14/2021   AST 20 01/14/2021   ALKPHOS 65 01/14/2021   BILITOT 0.9 01/14/2021   No results found for: "25OHVITD2", "25OHVITD3", "VD25OH"   Review of Systems  Constitutional:  Negative for chills and fever.  HENT:  Negative for drooling, ear discharge and ear pain.   Respiratory:  Negative for cough, shortness of breath and wheezing.   Cardiovascular:  Negative for palpitations, leg swelling and PND.  Gastrointestinal:  Negative for  abdominal pain, blood in stool, constipation, diarrhea, heartburn and nausea.  Endocrine: Negative for polydipsia and polyuria.  Genitourinary:  Negative for dysuria, frequency, hematuria and urgency.  Musculoskeletal:  Negative for back pain,  myalgias and neck pain.  Skin:  Negative for rash.  Allergic/Immunologic: Negative for environmental allergies.  Neurological:  Negative for dizziness and headaches.  Hematological:  Does not bruise/bleed easily.  Psychiatric/Behavioral:  Negative for sleep disturbance and suicidal ideas. The patient has insomnia. The patient is not nervous/anxious.     Patient Active Problem List   Diagnosis Date Noted   Anxiety 03/04/2021   COPD (chronic obstructive pulmonary disease) ([place]) 02/12/2021   H/O total hysterectomy 08/05/2020   UTI (urinary tract infection) 02/27/2020   Fall at home, initial encounter 02/27/2020   S/P resection of meningioma 10/31/2019   Pulmonary emboli ([place]) 10/31/2019   Sepsis secondary to UTI ([place]) 10/01/2019   Sepsis ([place]) 10/01/2019   Hypokalemia 10/01/2019   Urinary urgency 09/15/2019   Atherosclerosis of abdominal aorta ([place]) 10/12/2018   SOBOE (shortness of breath on exertion) 10/12/2018   Osteopenia of neck of right femur 06/29/2018   Breast cancer, right ([place]) 06/17/2016   Essential hypertension 12/13/2015   Breast abscess of female 11/07/2015   Familial multiple lipoprotein-type hyperlipidemia 10/25/2014   Clinical depression 10/25/2014   Acid reflux 10/25/2014   Acute gastrointestinal bleeding 10/25/2014   Aggrieved 10/25/2014   Airway hyperreactivity 10/25/2014   Arthritis of knee 10/25/2014   H/O: gout 10/25/2014   Adiposity 10/25/2014   Arthritis, degenerative 10/25/2014   H/O adenomatous polyp of colon 03/07/2014    Allergies  Allergen Reactions   Sulfa Antibiotics     Past Surgical History:  Procedure Laterality Date   BREAST BIOPSY Bilateral    negative   BREAST EXCISIONAL BIOPSY Right    right positive 04/2010   BREAST LUMPECTOMY Right    removed a cancerous lump   VAGINAL HYSTERECTOMY      Social History   Tobacco Use   Smoking status: Former    Packs/day: 0.25    Years: 2.00    Total pack years: 0.50    Types: Cigarettes    Smokeless tobacco: Never   Tobacco comments:    smoking cessation materials not required  Vaping Use   Vaping Use: Never used  Substance Use Topics   Alcohol use: Not Currently   Drug use: Never     Medication list has been reviewed and updated.  Current Meds  Medication Sig   albuterol (VENTOLIN HFA) 108 (90 Base) MCG/ACT inhaler Inhale 2 puffs into the lungs every 6 (six) hours as needed for wheezing or shortness of breath. Include aerochamber   allopurinol (ZYLOPRIM) 100 MG tablet TAKE (1) TABLET BY MOUTH EVERY DAY   calcium carbonate (TUMS - DOSED IN MG ELEMENTAL CALCIUM) 500 MG chewable tablet Chew 1 tablet by mouth every 8 (eight) hours as needed for indigestion.   cetirizine (ZYRTEC) 10 MG tablet Take 10 mg by mouth daily. For allergies.   ferrous sulfate 325 (65 FE) MG tablet Take 325 mg by mouth daily with breakfast.   Fluticasone-Umeclidin-Vilant (TRELEGY ELLIPTA) 100-62.5-25 MCG/INH AEPB Inhale 1 puff into the lungs daily.   losartan (COZAAR) 25 MG tablet Take 1 by mouth daily   lovastatin (MEVACOR) 40 MG tablet TAKE 1 TABLET EVERY DAY AT BEDTIME   meclizine (ANTIVERT) 12.5 MG tablet Take 1 tablet (12.5 mg total) by mouth 3 (three) times daily as needed for dizziness.   Melatonin 3  MG TBDP Take 3 mg by mouth at bedtime.   montelukast (SINGULAIR) 10 MG tablet Take 1 tablet (10 mg total) by mouth at bedtime.   mupirocin ointment (BACTROBAN) 2 % Apply 1 application topically 2 (two) times daily.   nystatin cream (MYCOSTATIN) APPLY 1 APPLICATION TOPICALLY TWICE DAILY   omeprazole (PRILOSEC) 20 MG capsule One a day   polyethylene glycol (MIRALAX / GLYCOLAX) 17 g packet Take 17 g by mouth daily as needed.   sertraline (ZOLOFT) 100 MG tablet Take 100 mg by mouth daily.   traZODone (DESYREL) 50 MG tablet TAKE 1 TABLET EVERY DAY AT BEDTIME   [DISCONTINUED] apixaban (ELIQUIS) 5 MG TABS tablet TAKE (1) TABLET BY MOUTH TWICE DAILY       01/13/2022    9:06 AM 07/14/2021    9:42  AM 03/12/2021   11:15 AM 10/01/2020    2:41 PM  GAD 7 : Generalized Anxiety Score  Nervous, Anxious, on Edge 0 0 3 0  Control/stop worrying 0 0 3 0  Worry too much - different things 0 0 3 0  Trouble relaxing 0 0 3 0  Restless 0 0 0 0  Easily annoyed or irritable 0 0 2 0  Afraid - awful might happen 0 0 0 0  Total GAD 7 Score 0 0 14 0  Anxiety Difficulty Not difficult at all Not difficult at all Somewhat difficult        01/13/2022    9:03 AM 07/14/2021    9:42 AM 05/21/2021    2:06 PM  Depression screen PHQ 2/9  Decreased Interest 0 0 3  Down, Depressed, Hopeless 1 0 3  PHQ - 2 Score 1 0 6  Altered sleeping 0 0 0  Tired, decreased energy 0 0 3  Change in appetite 0 0 0  Feeling bad or failure about yourself  0 0 0  Trouble concentrating 0 0 1  Moving slowly or fidgety/restless 0 0 0  Suicidal thoughts 0 0 0  PHQ-9 Score 1 0 10  Difficult doing work/chores Not difficult at all Not difficult at all Not difficult at all    BP Readings from Last 3 Encounters:  01/13/22 110/80  07/14/21 100/60  06/11/21 113/61    Physical Exam Vitals and nursing note reviewed. Exam conducted with a chaperone present.  Constitutional:      General: She is not in acute distress.    Appearance: She is not diaphoretic.  HENT:     Head: Normocephalic and atraumatic.     Right Ear: External ear normal.     Left Ear: External ear normal.     Nose: Nose normal. No congestion or rhinorrhea.     Mouth/Throat:     Mouth: Mucous membranes are moist.  Eyes:     General:        Right eye: No discharge.        Left eye: No discharge.     Conjunctiva/sclera: Conjunctivae normal.     Pupils: Pupils are equal, round, and reactive to light.  Neck:     Thyroid: No thyromegaly.     Vascular: No JVD.  Cardiovascular:     Rate and Rhythm: Normal rate and regular rhythm.     Heart sounds: Normal heart sounds, S1 normal and S2 normal. No murmur heard.    No systolic murmur is present.     No diastolic  murmur is present.     No friction rub. No gallop. No S3 or  S4 sounds.  Pulmonary:     Effort: Pulmonary effort is normal.     Breath sounds: Normal breath sounds. No wheezing, rhonchi or rales.  Abdominal:     General: Bowel sounds are normal.     Palpations: Abdomen is soft. There is no mass.     Tenderness: There is no abdominal tenderness. There is no guarding.  Musculoskeletal:        General: Normal range of motion.     Cervical back: Normal range of motion and neck supple.  Lymphadenopathy:     Cervical: No cervical adenopathy.  Skin:    General: Skin is warm and dry.  Neurological:     Mental Status: She is alert.     Deep Tendon Reflexes: Reflexes are normal and symmetric.     Wt Readings from Last 3 Encounters:  01/13/22 215 lb (97.5 kg)  07/14/21 213 lb (96.6 kg)  06/11/21 223 lb 15.8 oz (101.6 kg)    BP 110/80   Pulse 80   Ht '5\' 3"'  (1.6 m)   Wt 215 lb (97.5 kg)   BMI 38.09 kg/m   Assessment and Plan: 1. Essential hypertension Chronic.  Controlled.  Stable.  Blood pressure 110/80.  Continue losartan 25 mg once a day.  Will check CMP for electrolytes and GFR.  We will recheck patient in 6 months. - losartan (COZAAR) 25 MG tablet; Take 1 by mouth daily  Dispense: 90 tablet; Refill: 1 - Comprehensive Metabolic Panel (CMET)  2. Mild intermittent asthma without complication Chronic.  Uncontrolled because patient has not been taking Trelegy.  Patient has been refilled on this and has been encouraged to use her albuterol on an as-needed basis with her AeroChamber.  We will continue Singulair as well at 10 mg once a day. - albuterol (VENTOLIN HFA) 108 (90 Base) MCG/ACT inhaler; Inhale 2 puffs into the lungs every 6 (six) hours as needed for wheezing or shortness of breath. Include aerochamber  Dispense: 18 g; Refill: 1 - montelukast (SINGULAIR) 10 MG tablet; Take 1 tablet (10 mg total) by mouth at bedtime.  Dispense: 90 tablet; Refill: 1 - Fluticasone-Umeclidin-Vilant  (TRELEGY ELLIPTA) 100-62.5-25 MCG/ACT AEPB; Inhale 1 puff into the lungs daily.  Dispense: 1 each; Refill: 11  3. Hyperuricemia Chronic.  Controlled.  Stable.  Continue allopurinol 100 mg once a day. - allopurinol (ZYLOPRIM) 100 MG tablet; TAKE (1) TABLET BY MOUTH EVERY DAY  Dispense: 90 tablet; Refill: 1  4. Familial multiple lipoprotein-type hyperlipidemia Chronic.  Controlled.  Stable.  Will check lipid panel for current status of LDL will likely continue lovastatin 40 mg once a day.  We will recheck in 6 months. - lovastatin (MEVACOR) 40 MG tablet; TAKE 1 TABLET EVERY DAY AT BEDTIME  Dispense: 90 tablet; Refill: 1 - Lipid Panel With LDL/HDL Ratio  5. Gastroesophageal reflux disease, unspecified whether esophagitis present Chronic.  Controlled.  Stable.  Patient still avoids [place] food but does not realize Janine Limbo is [place] and we will continue omeprazole 20 mg once a day so she can tolerate [employer]. - omeprazole (PRILOSEC) 20 MG capsule; One a day  Dispense: 90 capsule; Refill: 1  6. Primary insomnia Chronic.  Controlled.  Stable.  Continue trazodone as this is improved sleep but not as optimal yet. - traZODone (DESYREL) 50 MG tablet; TAKE 1 TABLET EVERY DAY AT BEDTIME  Dispense: 90 tablet; Refill: 1  7. Hyperglycemia As noted patient has had some elevated glucose readings and we will check A1c  to see if prediabetic and will adjust diet accordingly. - Comprehensive Metabolic Panel (CMET) - HgB A1c     Otilio Miu, MD

## 2022-01-14 LAB — COMPREHENSIVE METABOLIC PANEL
ALT: 10 IU/L (ref 0–32)
AST: 14 IU/L (ref 0–40)
Albumin/Globulin Ratio: 2.2 (ref 1.2–2.2)
Albumin: 4.2 g/dL (ref 3.7–4.7)
Alkaline Phosphatase: 75 IU/L (ref 44–121)
BUN/Creatinine Ratio: 15 (ref 12–28)
BUN: 12 mg/dL (ref 8–27)
Bilirubin Total: 0.9 mg/dL (ref 0.0–1.2)
CO2: 26 mmol/L (ref 20–29)
Calcium: 9.5 mg/dL (ref 8.7–10.3)
Chloride: 101 mmol/L (ref 96–106)
Creatinine, Ser: 0.82 mg/dL (ref 0.57–1.00)
Globulin, Total: 1.9 g/dL (ref 1.5–4.5)
Glucose: 106 mg/dL — ABNORMAL HIGH (ref 70–99)
Potassium: 4.4 mmol/L (ref 3.5–5.2)
Sodium: 143 mmol/L (ref 134–144)
Total Protein: 6.1 g/dL (ref 6.0–8.5)
eGFR: 70 mL/min/{1.73_m2} (ref >59)

## 2022-01-14 LAB — LIPID PANEL WITH LDL/HDL RATIO
Cholesterol, Total: 173 mg/dL (ref 100–199)
HDL: 48 mg/dL (ref >39)
LDL Chol Calc (NIH): 105 mg/dL — ABNORMAL HIGH (ref 0–99)
LDL/HDL Ratio: 2.2 ratio (ref 0.0–3.2)
Triglycerides: 112 mg/dL (ref 0–149)
VLDL Cholesterol Cal: 20 mg/dL (ref 5–40)

## 2022-01-14 LAB — HEMOGLOBIN A1C
Est. average glucose Bld gHb Est-mCnc: 114 mg/dL
Hgb A1c MFr Bld: 5.6 % (ref 4.8–5.6)

## 2022-02-04 ENCOUNTER — Telehealth: Payer: Self-pay | Admitting: Family Medicine

## 2022-02-04 NOTE — Telephone Encounter (Signed)
Called pt she stated she was having UTI symptoms. Pt stated she normally doesn't have to come in that medication is called in for her. Offered appt pt stated she had no way to come in.  [place]

## 2022-02-04 NOTE — Telephone Encounter (Signed)
Copied from [place] [id number]. Topic: General - Other >> Feb 04, 2022 11:34 AM Cyndi Bender wrote: Reason for CRM: Pt requests that [name] return her call at [phone]

## 2022-02-06 ENCOUNTER — Ambulatory Visit (INDEPENDENT_AMBULATORY_CARE_PROVIDER_SITE_OTHER): Payer: Medicare HMO | Admitting: Family Medicine

## 2022-02-06 ENCOUNTER — Encounter: Payer: Self-pay | Admitting: Family Medicine

## 2022-02-06 VITALS — BP 120/80 | HR 72 | Ht 63.0 in | Wt 215.0 lb

## 2022-02-06 DIAGNOSIS — Z23 Encounter for immunization: Secondary | ICD-10-CM

## 2022-02-06 DIAGNOSIS — N3090 Cystitis, unspecified without hematuria: Secondary | ICD-10-CM | POA: Diagnosis not present

## 2022-02-06 LAB — POCT URINALYSIS DIPSTICK
Bilirubin, UA: NEGATIVE
Glucose, UA: NEGATIVE
Ketones, UA: NEGATIVE
Nitrite, UA: NEGATIVE
Protein, UA: POSITIVE — AB
Spec Grav, UA: <=1.005 — AB (ref 1.010–1.025)
Urobilinogen, UA: 0.2 E.U./dL
pH, UA: 6 (ref 5.0–8.0)

## 2022-02-06 MED ORDER — CEPHALEXIN 500 MG PO CAPS: 500.0000 mg | ORAL_CAPSULE | Freq: Three times a day (TID) | ORAL | 0 refills | Status: DC

## 2022-02-06 MED ORDER — FLUCONAZOLE 150 MG PO TABS: 150.0000 mg | ORAL_TABLET | Freq: Once | ORAL | 0 refills | Status: AC

## 2022-02-06 NOTE — Progress Notes (Signed)
Date:  02/06/2022   Name:  Kirsten Snyder   DOB:  November 16, 1933   MRN:  [id number]   Chief Complaint: Urinary Tract Infection and Flu Vaccine  Urinary Tract Infection  This is a new problem. The current episode started in the past 7 days. The problem occurs intermittently. The problem has been gradually improving. The quality of the pain is described as burning. The pain is moderate. There has been no fever. Associated symptoms include frequency, hematuria and urgency. Pertinent negatives include no discharge, flank pain or possible pregnancy. The treatment provided mild relief.    Lab Results  Component Value Date   NA 143 01/13/2022   K 4.4 01/13/2022   CO2 26 01/13/2022   GLUCOSE 106 (H) 01/13/2022   BUN 12 01/13/2022   CREATININE 0.82 01/13/2022   CALCIUM 9.5 01/13/2022   EGFR 70 01/13/2022   GFRNONAA >60 03/01/2020   Lab Results  Component Value Date   CHOL 173 01/13/2022   HDL 48 01/13/2022   LDLCALC 105 (H) 01/13/2022   TRIG 112 01/13/2022   CHOLHDL 4.8 (H) 11/02/2017   Lab Results  Component Value Date   TSH 2.39 09/15/2012   Lab Results  Component Value Date   HGBA1C 5.6 01/13/2022   Lab Results  Component Value Date   WBC 5.6 01/14/2021   HGB 13.4 01/14/2021   HCT 38.7 01/14/2021   MCV 90 01/14/2021   PLT 299 01/14/2021   Lab Results  Component Value Date   ALT 10 01/13/2022   AST 14 01/13/2022   ALKPHOS 75 01/13/2022   BILITOT 0.9 01/13/2022   No results found for: "25OHVITD2", "25OHVITD3", "VD25OH"   Review of Systems  Cardiovascular:  Negative for chest pain, palpitations and leg swelling.  Gastrointestinal:  Negative for abdominal pain.  Genitourinary:  Positive for dysuria, frequency, hematuria and urgency. Negative for difficulty urinating, flank pain, vaginal bleeding and vaginal discharge.  Musculoskeletal:  Negative for back pain.    Patient Active Problem List   Diagnosis Date Noted   Anxiety 03/04/2021   COPD (chronic obstructive  pulmonary disease) ([place]) 02/12/2021   H/O total hysterectomy 08/05/2020   UTI (urinary tract infection) 02/27/2020   Fall at home, initial encounter 02/27/2020   S/P resection of meningioma 10/31/2019   Pulmonary emboli ([place]) 10/31/2019   Sepsis secondary to UTI ([place]) 10/01/2019   Sepsis ([place]) 10/01/2019   Hypokalemia 10/01/2019   Urinary urgency 09/15/2019   Atherosclerosis of abdominal aorta ([place]) 10/12/2018   SOBOE (shortness of breath on exertion) 10/12/2018   Osteopenia of neck of right femur 06/29/2018   Breast cancer, right ([place]) 06/17/2016   Essential hypertension 12/13/2015   Breast abscess of female 11/07/2015   Familial multiple lipoprotein-type hyperlipidemia 10/25/2014   Clinical depression 10/25/2014   Acid reflux 10/25/2014   Acute gastrointestinal bleeding 10/25/2014   Aggrieved 10/25/2014   Airway hyperreactivity 10/25/2014   Arthritis of knee 10/25/2014   H/O: gout 10/25/2014   Adiposity 10/25/2014   Arthritis, degenerative 10/25/2014   H/O adenomatous polyp of colon 03/07/2014    Allergies  Allergen Reactions   Sulfa Antibiotics     Past Surgical History:  Procedure Laterality Date   BREAST BIOPSY Bilateral    negative   BREAST EXCISIONAL BIOPSY Right    right positive 04/2010   BREAST LUMPECTOMY Right    removed a cancerous lump   VAGINAL HYSTERECTOMY      Social History   Tobacco Use   Smoking status: Former  Packs/day: 0.25    Years: 2.00    Total pack years: 0.50    Types: Cigarettes   Smokeless tobacco: Never   Tobacco comments:    smoking cessation materials not required  Vaping Use   Vaping Use: Never used  Substance Use Topics   Alcohol use: Not Currently   Drug use: Never     Medication list has been reviewed and updated.  Current Meds  Medication Sig   albuterol (VENTOLIN HFA) 108 (90 Base) MCG/ACT inhaler Inhale 2 puffs into the lungs every 6 (six) hours as needed for wheezing or shortness of breath. Include  aerochamber   allopurinol (ZYLOPRIM) 100 MG tablet TAKE (1) TABLET BY MOUTH EVERY DAY   calcium carbonate (TUMS - DOSED IN MG ELEMENTAL CALCIUM) 500 MG chewable tablet Chew 1 tablet by mouth every 8 (eight) hours as needed for indigestion.   cetirizine (ZYRTEC) 10 MG tablet Take 10 mg by mouth daily. For allergies.   ferrous sulfate 325 (65 FE) MG tablet Take 325 mg by mouth daily with breakfast.   Fluticasone-Umeclidin-Vilant (TRELEGY ELLIPTA) 100-62.5-25 MCG/ACT AEPB Inhale 1 puff into the lungs daily.   losartan (COZAAR) 25 MG tablet Take 1 by mouth daily   lovastatin (MEVACOR) 40 MG tablet TAKE 1 TABLET EVERY DAY AT BEDTIME   meclizine (ANTIVERT) 12.5 MG tablet Take 1 tablet (12.5 mg total) by mouth 3 (three) times daily as needed for dizziness.   Melatonin 3 MG TBDP Take 3 mg by mouth at bedtime.   montelukast (SINGULAIR) 10 MG tablet Take 1 tablet (10 mg total) by mouth at bedtime.   mupirocin ointment (BACTROBAN) 2 % Apply 1 application topically 2 (two) times daily.   nystatin cream (MYCOSTATIN) APPLY 1 APPLICATION TOPICALLY TWICE DAILY   omeprazole (PRILOSEC) 20 MG capsule One a day   polyethylene glycol (MIRALAX / GLYCOLAX) 17 g packet Take 17 g by mouth daily as needed.   sertraline (ZOLOFT) 100 MG tablet Take 100 mg by mouth daily.   traZODone (DESYREL) 50 MG tablet TAKE 1 TABLET EVERY DAY AT BEDTIME       02/06/2022   11:21 AM 01/13/2022    9:06 AM 07/14/2021    9:42 AM 03/12/2021   11:15 AM  GAD 7 : Generalized Anxiety Score  Nervous, Anxious, on Edge 0 0 0 3  Control/stop worrying 0 0 0 3  Worry too much - different things 0 0 0 3  Trouble relaxing 0 0 0 3  Restless 0 0 0 0  Easily annoyed or irritable 0 0 0 2  Afraid - awful might happen 0 0 0 0  Total GAD 7 Score 0 0 0 14  Anxiety Difficulty Not difficult at all Not difficult at all Not difficult at all Somewhat difficult       02/06/2022   11:21 AM 01/13/2022    9:03 AM 07/14/2021    9:42 AM  Depression screen  PHQ 2/9  Decreased Interest 0 0 0  Down, Depressed, Hopeless 0 1 0  PHQ - 2 Score 0 1 0  Altered sleeping 0 0 0  Tired, decreased energy 0 0 0  Change in appetite 0 0 0  Feeling bad or failure about yourself  0 0 0  Trouble concentrating 0 0 0  Moving slowly or fidgety/restless 0 0 0  Suicidal thoughts 0 0 0  PHQ-9 Score 0 1 0  Difficult doing work/chores Not difficult at all Not difficult at all Not difficult at all  BP Readings from Last 3 Encounters:  02/06/22 120/80  01/13/22 110/80  07/14/21 100/60    Physical Exam Vitals and nursing note reviewed.  Constitutional:      Appearance: She is well-developed.  HENT:     Head: Normocephalic.  Eyes:     General: Lids are everted, no foreign bodies appreciated. No scleral icterus.       Left eye: No foreign body or hordeolum.     Conjunctiva/sclera:     Right eye: Right conjunctiva is not injected.     Left eye: Left conjunctiva is not injected.     Pupils: Pupils are equal, round, and reactive to light.  Neck:     Thyroid: No thyromegaly.     Vascular: No JVD.     Trachea: No tracheal deviation.  Cardiovascular:     Rate and Rhythm: Normal rate and regular rhythm.     Heart sounds: Normal heart sounds. No murmur heard.    No friction rub. No gallop.  Pulmonary:     Effort: Pulmonary effort is normal. No respiratory distress.     Breath sounds: Normal breath sounds. No wheezing, rhonchi or rales.  Abdominal:     Palpations: Abdomen is soft. There is no mass.     Tenderness: There is no abdominal tenderness. There is no right CVA tenderness, left CVA tenderness, guarding or rebound.  Musculoskeletal:        General: No tenderness. Normal range of motion.     Cervical back: Normal range of motion and neck supple.  Lymphadenopathy:     Cervical: No cervical adenopathy.  Skin:    General: Skin is warm.     Findings: No rash.  Neurological:     Mental Status: She is alert.  Psychiatric:        Mood and Affect:  Mood is not anxious or depressed.     Wt Readings from Last 3 Encounters:  02/06/22 215 lb (97.5 kg)  01/13/22 215 lb (97.5 kg)  07/14/21 213 lb (96.6 kg)    BP 120/80   Pulse 72   Ht '5\' 3"'  (1.6 m)   Wt 215 lb (97.5 kg)   BMI 38.09 kg/m   Assessment and Plan  1. Cystitis New onset.  Persistent.  Began in the early part of the week with dysuria, frequency and urgency.  Exam notes mild tenderness over suprapubic area without CVA tenderness.  We will treat with cephalexin 500 mg 3 times a day for 5 days with a Diflucan to follow on the final day.  We will send the urine for culture and that it has a significant number of leukocytes and similar to an infection she had last October at this time.  Patient is to return as needed. - POCT urinalysis dipstick - fluconazole (DIFLUCAN) 150 MG tablet; Take 1 tablet (150 mg total) by mouth once for 1 dose.  Dispense: 1 tablet; Refill: 0 - cephALEXin (KEFLEX) 500 MG capsule; Take 1 capsule (500 mg total) by mouth 3 (three) times daily.  Dispense: 15 capsule; Refill: 0  2. Need for immunization against influenza Discussed and administered - Flu Vaccine QUAD High Dose(Fluad)   Otilio Miu, MD

## 2022-02-10 LAB — URINE CULTURE

## 2022-02-23 ENCOUNTER — Ambulatory Visit: Payer: Self-pay | Admitting: *Deleted

## 2022-02-23 NOTE — Telephone Encounter (Signed)
  Chief Complaint: Cough Symptoms: Productive cough, greenish,wheezing at times, using inhaler Frequency: Wednesday Pertinent Negatives: Patient denies SOB, Fever Disposition: '[]'$ ED /'[]'$ Urgent Care (no appt availability in office) / '[x]'$ Appointment(In office/virtual)/ '[]'$  [place]/ '[]'$ Home Care/ '[]'$ Refused Recommended Disposition /'[]'$ [facility]/ '[]'$  Follow-up with PCP Additional Notes: Appt secured for tomorrow AM, does not have transportation today. Care advise provided, pt verbalizes understanding. Reason for Disposition  [1] Known COPD or other severe lung disease (i.e., bronchiectasis, cystic fibrosis, lung surgery) AND [2] worsening symptoms (i.e., increased sputum purulence or amount, increased breathing difficulty  Answer Assessment - Initial Assessment Questions 1. ONSET: "When did the cough begin?"    Wednesday 2. SEVERITY: "How bad is the cough today?"      Worse in mornings 3. SPUTUM: "Describe the color of your sputum" (none, dry cough; clear, white, yellow, green)     Greenish 4. HEMOPTYSIS: "Are you coughing up any blood?" If so ask: "How much?" (flecks, streaks, tablespoons, etc.)     no 5. DIFFICULTY BREATHING: "Are you having difficulty breathing?" If Yes, ask: "How bad is it?" (e.g., mild, moderate, severe)    - MILD: No SOB at rest, mild SOB with walking, speaks normally in sentences, can lie down, no retractions, pulse < 100.    - MODERATE: SOB at rest, SOB with minimal exertion and prefers to sit, cannot lie down flat, speaks in phrases, mild retractions, audible wheezing, pulse 100-120.    - SEVERE: Very SOB at rest, speaks in single words, struggling to breathe, sitting hunched forward, retractions, pulse > 120      no 6. FEVER: "Do you have a fever?" If Yes, ask: "What is your temperature, how was it measured, and when did it start?"     no 7. CARDIAC HISTORY: "Do you have any history of heart disease?" (e.g., heart attack, congestive heart  failure)       8. LUNG HISTORY: "Do you have any history of lung disease?"  (e.g., pulmonary embolus, asthma, emphysema)     COPD 9. PE RISK FACTORS: "Do you have a history of blood clots?" (or: recent major surgery, recent prolonged travel, bedridden)      10. OTHER SYMPTOMS: "Do you have any other symptoms?" (e.g., runny nose, wheezing, chest pain)       Wheezing at times  Protocols used: Cough - Acute Productive-A-AH

## 2022-02-24 ENCOUNTER — Encounter: Payer: Self-pay | Admitting: Family Medicine

## 2022-02-24 ENCOUNTER — Ambulatory Visit (INDEPENDENT_AMBULATORY_CARE_PROVIDER_SITE_OTHER): Payer: Medicare HMO | Admitting: Family Medicine

## 2022-02-24 ENCOUNTER — Ambulatory Visit
Admission: RE | Admit: 2022-02-24 | Discharge: 2022-02-24 | Disposition: A | Discharge status: Home / Self Care | Payer: Medicare HMO | Attending: Family Medicine | Admitting: Family Medicine

## 2022-02-24 ENCOUNTER — Other Ambulatory Visit
Admission: RE | Admit: 2022-02-24 | Discharge: 2022-02-24 | Disposition: A | Discharge status: Home / Self Care | Payer: Medicare HMO | Source: Home / Self Care | Attending: Family Medicine | Admitting: Family Medicine

## 2022-02-24 ENCOUNTER — Ambulatory Visit
Admission: RE | Admit: 2022-02-24 | Discharge: 2022-02-24 | Disposition: A | Discharge status: Home / Self Care | Payer: Medicare HMO | Source: Ambulatory Visit | Attending: Family Medicine | Admitting: Family Medicine

## 2022-02-24 VITALS — BP 102/62 | HR 100 | Ht 63.0 in | Wt 211.0 lb

## 2022-02-24 DIAGNOSIS — J189 Pneumonia, unspecified organism: Secondary | ICD-10-CM | POA: Insufficient documentation

## 2022-02-24 DIAGNOSIS — R051 Acute cough: Secondary | ICD-10-CM | POA: Diagnosis not present

## 2022-02-24 DIAGNOSIS — R911 Solitary pulmonary nodule: Secondary | ICD-10-CM | POA: Diagnosis not present

## 2022-02-24 DIAGNOSIS — R059 Cough, unspecified: Secondary | ICD-10-CM | POA: Diagnosis not present

## 2022-02-24 LAB — CBC WITH DIFFERENTIAL/PLATELET
Abs Immature Granulocytes: 0.01 K/uL (ref 0.00–0.07)
Basophils Absolute: 0.1 K/uL (ref 0.0–0.1)
Basophils Relative: 1 %
Eosinophils Absolute: 0.2 K/uL (ref 0.0–0.5)
Eosinophils Relative: 4 %
HCT: 38.7 % (ref 36.0–46.0)
Hemoglobin: 13 g/dL (ref 12.0–15.0)
Immature Granulocytes: 0 %
Lymphocytes Relative: 30 %
Lymphs Abs: 1.9 K/uL (ref 0.7–4.0)
MCH: 31 pg (ref 26.0–34.0)
MCHC: 33.6 g/dL (ref 30.0–36.0)
MCV: 92.1 fL (ref 80.0–100.0)
Monocytes Absolute: 0.6 K/uL (ref 0.1–1.0)
Monocytes Relative: 9 %
Neutro Abs: 3.6 K/uL (ref 1.7–7.7)
Neutrophils Relative %: 56 %
Platelets: 259 K/uL (ref 150–400)
RBC: 4.2 MIL/uL (ref 3.87–5.11)
RDW: 13.4 % (ref 11.5–15.5)
WBC: 6.4 K/uL (ref 4.0–10.5)
nRBC: 0 % (ref 0.0–0.2)

## 2022-02-24 LAB — POC COVID19 BINAXNOW: SARS Coronavirus 2 Ag: NEGATIVE

## 2022-02-24 LAB — POCT INFLUENZA A/B
Influenza A, POC: NEGATIVE
Influenza B, POC: NEGATIVE

## 2022-02-24 MED ORDER — GUAIFENESIN-CODEINE 100-10 MG/5ML PO SYRP: 5.0000 mL | ORAL_SOLUTION | Freq: Three times a day (TID) | ORAL | 0 refills | Status: DC | PRN

## 2022-02-24 MED ORDER — AMOXICILLIN-POT CLAVULANATE 875-125 MG PO TABS: 1.0000 | ORAL_TABLET | Freq: Two times a day (BID) | ORAL | 0 refills | Status: DC

## 2022-02-24 NOTE — Progress Notes (Signed)
Date:  02/24/2022   Name:  Kirsten Snyder   DOB:  1933/11/07   MRN:  [id number]   Chief Complaint: No chief complaint on file.  Cough This is a new problem. The current episode started in the past 7 days. The problem has been unchanged. The problem occurs every few minutes. The cough is Productive of purulent sputum (brown). Associated symptoms include chills, ear pain, nasal congestion, postnasal drip, rhinorrhea, a sore throat and wheezing. Pertinent negatives include no chest pain, fever, headaches or shortness of breath.    Lab Results  Component Value Date   NA 143 01/13/2022   K 4.4 01/13/2022   CO2 26 01/13/2022   GLUCOSE 106 (H) 01/13/2022   BUN 12 01/13/2022   CREATININE 0.82 01/13/2022   CALCIUM 9.5 01/13/2022   EGFR 70 01/13/2022   GFRNONAA >60 03/01/2020   Lab Results  Component Value Date   CHOL 173 01/13/2022   HDL 48 01/13/2022   LDLCALC 105 (H) 01/13/2022   TRIG 112 01/13/2022   CHOLHDL 4.8 (H) 11/02/2017   Lab Results  Component Value Date   TSH 2.39 09/15/2012   Lab Results  Component Value Date   HGBA1C 5.6 01/13/2022   Lab Results  Component Value Date   WBC 5.6 01/14/2021   HGB 13.4 01/14/2021   HCT 38.7 01/14/2021   MCV 90 01/14/2021   PLT 299 01/14/2021   Lab Results  Component Value Date   ALT 10 01/13/2022   AST 14 01/13/2022   ALKPHOS 75 01/13/2022   BILITOT 0.9 01/13/2022   No results found for: "25OHVITD2", "25OHVITD3", "VD25OH"   Review of Systems  Constitutional:  Positive for chills and fatigue. Negative for diaphoresis and fever.  HENT:  Positive for congestion, ear pain, nosebleeds, postnasal drip, rhinorrhea and sore throat. Negative for sinus pressure, sinus pain and sneezing.   Respiratory:  Positive for cough and wheezing. Negative for shortness of breath.   Cardiovascular:  Negative for chest pain, palpitations and leg swelling.  Neurological:  Negative for headaches.    Patient Active Problem List   Diagnosis  Date Noted   Anxiety 03/04/2021   COPD (chronic obstructive pulmonary disease) ([place]) 02/12/2021   H/O total hysterectomy 08/05/2020   UTI (urinary tract infection) 02/27/2020   Fall at home, initial encounter 02/27/2020   S/P resection of meningioma 10/31/2019   Pulmonary emboli ([place]) 10/31/2019   Sepsis secondary to UTI ([place]) 10/01/2019   Sepsis ([place]) 10/01/2019   Hypokalemia 10/01/2019   Urinary urgency 09/15/2019   Atherosclerosis of abdominal aorta ([place]) 10/12/2018   SOBOE (shortness of breath on exertion) 10/12/2018   Osteopenia of neck of right femur 06/29/2018   Breast cancer, right ([place]) 06/17/2016   Essential hypertension 12/13/2015   Breast abscess of female 11/07/2015   Familial multiple lipoprotein-type hyperlipidemia 10/25/2014   Clinical depression 10/25/2014   Acid reflux 10/25/2014   Acute gastrointestinal bleeding 10/25/2014   Aggrieved 10/25/2014   Airway hyperreactivity 10/25/2014   Arthritis of knee 10/25/2014   H/O: gout 10/25/2014   Adiposity 10/25/2014   Arthritis, degenerative 10/25/2014   H/O adenomatous polyp of colon 03/07/2014    Allergies  Allergen Reactions   Sulfa Antibiotics     Past Surgical History:  Procedure Laterality Date   BREAST BIOPSY Bilateral    negative   BREAST EXCISIONAL BIOPSY Right    right positive 04/2010   BREAST LUMPECTOMY Right    removed a cancerous lump   VAGINAL HYSTERECTOMY  Social History   Tobacco Use   Smoking status: Former    Packs/day: 0.25    Years: 2.00    Total pack years: 0.50    Types: Cigarettes   Smokeless tobacco: Never   Tobacco comments:    smoking cessation materials not required  Vaping Use   Vaping Use: Never used  Substance Use Topics   Alcohol use: Not Currently   Drug use: Never     Medication list has been reviewed and updated.  Current Meds  Medication Sig   albuterol (VENTOLIN HFA) 108 (90 Base) MCG/ACT inhaler Inhale 2 puffs into the lungs every 6 (six) hours as  needed for wheezing or shortness of breath. Include aerochamber   allopurinol (ZYLOPRIM) 100 MG tablet TAKE (1) TABLET BY MOUTH EVERY DAY   calcium carbonate (TUMS - DOSED IN MG ELEMENTAL CALCIUM) 500 MG chewable tablet Chew 1 tablet by mouth every 8 (eight) hours as needed for indigestion.   cetirizine (ZYRTEC) 10 MG tablet Take 10 mg by mouth daily. For allergies.   ferrous sulfate 325 (65 FE) MG tablet Take 325 mg by mouth daily with breakfast.   Fluticasone-Umeclidin-Vilant (TRELEGY ELLIPTA) 100-62.5-25 MCG/ACT AEPB Inhale 1 puff into the lungs daily.   losartan (COZAAR) 25 MG tablet Take 1 by mouth daily   lovastatin (MEVACOR) 40 MG tablet TAKE 1 TABLET EVERY DAY AT BEDTIME   meclizine (ANTIVERT) 12.5 MG tablet Take 1 tablet (12.5 mg total) by mouth 3 (three) times daily as needed for dizziness.   Melatonin 3 MG TBDP Take 3 mg by mouth at bedtime.   montelukast (SINGULAIR) 10 MG tablet Take 1 tablet (10 mg total) by mouth at bedtime.   mupirocin ointment (BACTROBAN) 2 % Apply 1 application topically 2 (two) times daily.   nystatin cream (MYCOSTATIN) APPLY 1 APPLICATION TOPICALLY TWICE DAILY   omeprazole (PRILOSEC) 20 MG capsule One a day   polyethylene glycol (MIRALAX / GLYCOLAX) 17 g packet Take 17 g by mouth daily as needed.   sertraline (ZOLOFT) 100 MG tablet Take 100 mg by mouth daily.   traZODone (DESYREL) 50 MG tablet TAKE 1 TABLET EVERY DAY AT BEDTIME   [DISCONTINUED] cephALEXin (KEFLEX) 500 MG capsule Take 1 capsule (500 mg total) by mouth 3 (three) times daily.       02/06/2022   11:21 AM 01/13/2022    9:06 AM 07/14/2021    9:42 AM 03/12/2021   11:15 AM  GAD 7 : Generalized Anxiety Score  Nervous, Anxious, on Edge 0 0 0 3  Control/stop worrying 0 0 0 3  Worry too much - different things 0 0 0 3  Trouble relaxing 0 0 0 3  Restless 0 0 0 0  Easily annoyed or irritable 0 0 0 2  Afraid - awful might happen 0 0 0 0  Total GAD 7 Score 0 0 0 14  Anxiety Difficulty Not  difficult at all Not difficult at all Not difficult at all Somewhat difficult       02/06/2022   11:21 AM 01/13/2022    9:03 AM 07/14/2021    9:42 AM  Depression screen PHQ 2/9  Decreased Interest 0 0 0  Down, Depressed, Hopeless 0 1 0  PHQ - 2 Score 0 1 0  Altered sleeping 0 0 0  Tired, decreased energy 0 0 0  Change in appetite 0 0 0  Feeling bad or failure about yourself  0 0 0  Trouble concentrating 0 0 0  Moving  slowly or fidgety/restless 0 0 0  Suicidal thoughts 0 0 0  PHQ-9 Score 0 1 0  Difficult doing work/chores Not difficult at all Not difficult at all Not difficult at all    BP Readings from Last 3 Encounters:  02/24/22 102/62  02/06/22 120/80  01/13/22 110/80    Physical Exam Vitals and nursing note reviewed.  HENT:     Head: Normocephalic.     Right Ear: Tympanic membrane and ear canal normal.     Left Ear: Tympanic membrane and ear canal normal.     Nose: Congestion present. No rhinorrhea.     Mouth/Throat:     Mouth: Mucous membranes are moist.  Eyes:     Extraocular Movements: Extraocular movements intact.     Conjunctiva/sclera: Conjunctivae normal.     Pupils: Pupils are equal, round, and reactive to light.  Cardiovascular:     Rate and Rhythm: Normal rate and regular rhythm.     Heart sounds: No murmur heard.    No friction rub. No gallop.  Pulmonary:     Effort: Pulmonary effort is normal.     Breath sounds: Examination of the left-lower field reveals decreased breath sounds. Decreased breath sounds present. No wheezing, rhonchi or rales.  Musculoskeletal:     Cervical back: Neck supple.     Wt Readings from Last 3 Encounters:  02/24/22 211 lb (95.7 kg)  02/06/22 215 lb (97.5 kg)  01/13/22 215 lb (97.5 kg)    BP 102/62   Pulse 100   Ht _0  (1.6 m)   Wt 211 lb (95.7 kg)   SpO2 90%   BMI 37.38 kg/m   Assessment and Plan:  1. Acute cough New onset.  Persistent.  Bloody nasal discharge from the nostril.  Could be sinus versus  pneumonia given shortness of breath and decreased oxygenation we will cover for pneumonia.  COVID and influenza were negative.  Patient is to take a Robitussin-AC 1/2 teaspoon 3-4 times a day. - POC COVID-19 - POCT Influenza A/B - guaiFENesin-codeine (ROBITUSSIN AC) 100-10 MG/5ML syrup; Take 5 mLs by mouth 3 (three) times daily as needed for cough.  Dispense: 118 mL; Refill: 0  2. Pneumonia of left lower lobe due to infectious organism New onset.  Persistent.  Decreased breath sounds in the left lower lobe.  Consistent with pneumonia.  Will obtain chest x-ray with CBC.  Will initiate Augmentin 875 mg twice a day.  Isolate from grand baby A's. - amoxicillin-clavulanate (AUGMENTIN) 875-125 MG tablet; Take 1 tablet by mouth 2 (two) times daily.  Dispense: 20 tablet; Refill: 0 - DG Chest 2 View    Otilio Miu, MD

## 2022-03-19 ENCOUNTER — Telehealth: Payer: Self-pay | Admitting: Family Medicine

## 2022-03-19 ENCOUNTER — Telehealth: Payer: Self-pay

## 2022-03-19 DIAGNOSIS — I10 Essential (primary) hypertension: Secondary | ICD-10-CM | POA: Diagnosis not present

## 2022-03-19 DIAGNOSIS — Z20822 Contact with and (suspected) exposure to covid-19: Secondary | ICD-10-CM | POA: Diagnosis not present

## 2022-03-19 DIAGNOSIS — R911 Solitary pulmonary nodule: Secondary | ICD-10-CM | POA: Diagnosis not present

## 2022-03-19 DIAGNOSIS — R059 Cough, unspecified: Secondary | ICD-10-CM | POA: Diagnosis not present

## 2022-03-19 DIAGNOSIS — K5900 Constipation, unspecified: Secondary | ICD-10-CM | POA: Diagnosis not present

## 2022-03-19 DIAGNOSIS — Z79899 Other long term (current) drug therapy: Secondary | ICD-10-CM | POA: Diagnosis not present

## 2022-03-19 DIAGNOSIS — L309 Dermatitis, unspecified: Secondary | ICD-10-CM | POA: Diagnosis not present

## 2022-03-19 DIAGNOSIS — M549 Dorsalgia, unspecified: Secondary | ICD-10-CM | POA: Diagnosis not present

## 2022-03-19 DIAGNOSIS — R197 Diarrhea, unspecified: Secondary | ICD-10-CM | POA: Diagnosis not present

## 2022-03-19 DIAGNOSIS — J449 Chronic obstructive pulmonary disease, unspecified: Secondary | ICD-10-CM | POA: Diagnosis not present

## 2022-03-19 NOTE — Telephone Encounter (Signed)
Copied from [place]. Topic: Appointment Scheduling - Scheduling Inquiry for Clinic >> Mar 19, 2022  8:10 AM Leilani Able wrote: Reason for CRM: Scheduling ??? Called office, Pt son has called worked about mother. Treated 11/7, states was improving, starting sliding back this week, states this morning very unwell, told son that could hardly hold the phone to talk however son did say was not quite sure about that but knows he at least needs to bring pt back in, Office wants forwarded to [place]. Son cb # [phone]

## 2022-03-19 NOTE — Telephone Encounter (Signed)
Called and spoke to son- told him to take pt to [facility], it is a possibility she got something over Thanksgiving since she was getting better and now "so weak she can't hold the phone." She also needs fluids, more than likely. I asked him to call and give update.

## 2022-03-31 ENCOUNTER — Ambulatory Visit (INDEPENDENT_AMBULATORY_CARE_PROVIDER_SITE_OTHER): Payer: Medicare HMO | Admitting: Family Medicine

## 2022-03-31 ENCOUNTER — Encounter: Payer: Self-pay | Admitting: Family Medicine

## 2022-03-31 VITALS — BP 128/68 | HR 94 | Ht 63.0 in | Wt 212.0 lb

## 2022-03-31 DIAGNOSIS — J441 Chronic obstructive pulmonary disease with (acute) exacerbation: Secondary | ICD-10-CM

## 2022-03-31 DIAGNOSIS — M793 Panniculitis, unspecified: Secondary | ICD-10-CM | POA: Diagnosis not present

## 2022-03-31 DIAGNOSIS — B379 Candidiasis, unspecified: Secondary | ICD-10-CM | POA: Diagnosis not present

## 2022-03-31 MED ORDER — MUPIROCIN 2 % EX OINT: 1.0000 | TOPICAL_OINTMENT | Freq: Two times a day (BID) | CUTANEOUS | 3 refills | Status: DC

## 2022-03-31 MED ORDER — NYSTATIN 100000 UNIT/GM EX CREA: TOPICAL_CREAM | CUTANEOUS | 1 refills | Status: DC

## 2022-03-31 NOTE — Progress Notes (Signed)
Date:  03/31/2022   Name:  Kirsten Snyder   DOB:  Jan 09, 1934   MRN:  [id number]   Chief Complaint: Cough  Cough This is a recurrent problem. The current episode started 1 to 4 weeks ago. The problem has been gradually improving. The problem occurs constantly. The cough is Non-productive. Pertinent negatives include no chills, fever, nasal congestion, postnasal drip or shortness of breath.    Lab Results  Component Value Date   NA 143 01/13/2022   K 4.4 01/13/2022   CO2 26 01/13/2022   GLUCOSE 106 (H) 01/13/2022   BUN 12 01/13/2022   CREATININE 0.82 01/13/2022   CALCIUM 9.5 01/13/2022   EGFR 70 01/13/2022   GFRNONAA >60 03/01/2020   Lab Results  Component Value Date   CHOL 173 01/13/2022   HDL 48 01/13/2022   LDLCALC 105 (H) 01/13/2022   TRIG 112 01/13/2022   CHOLHDL 4.8 (H) 11/02/2017   Lab Results  Component Value Date   TSH 2.39 09/15/2012   Lab Results  Component Value Date   HGBA1C 5.6 01/13/2022   Lab Results  Component Value Date   WBC 6.4 02/24/2022   HGB 13.0 02/24/2022   HCT 38.7 02/24/2022   MCV 92.1 02/24/2022   PLT 259 02/24/2022   Lab Results  Component Value Date   ALT 10 01/13/2022   AST 14 01/13/2022   ALKPHOS 75 01/13/2022   BILITOT 0.9 01/13/2022   No results found for: "25OHVITD2", "25OHVITD3", "VD25OH"   Review of Systems  Constitutional:  Negative for chills and fever.  HENT:  Negative for postnasal drip.   Respiratory:  Positive for cough. Negative for shortness of breath.     Patient Active Problem List   Diagnosis Date Noted   Anxiety 03/04/2021   COPD (chronic obstructive pulmonary disease) ([place]) 02/12/2021   H/O total hysterectomy 08/05/2020   UTI (urinary tract infection) 02/27/2020   Fall at home, initial encounter 02/27/2020   S/P resection of meningioma 10/31/2019   Pulmonary emboli ([place]) 10/31/2019   Sepsis secondary to UTI ([place]) 10/01/2019   Sepsis ([place]) 10/01/2019   Hypokalemia 10/01/2019   Urinary urgency  09/15/2019   Atherosclerosis of abdominal aorta ([place]) 10/12/2018   SOBOE (shortness of breath on exertion) 10/12/2018   Osteopenia of neck of right femur 06/29/2018   Breast cancer, right ([place]) 06/17/2016   Essential hypertension 12/13/2015   Breast abscess of female 11/07/2015   Familial multiple lipoprotein-type hyperlipidemia 10/25/2014   Clinical depression 10/25/2014   Acid reflux 10/25/2014   Acute gastrointestinal bleeding 10/25/2014   Aggrieved 10/25/2014   Airway hyperreactivity 10/25/2014   Arthritis of knee 10/25/2014   H/O: gout 10/25/2014   Adiposity 10/25/2014   Arthritis, degenerative 10/25/2014   H/O adenomatous polyp of colon 03/07/2014    Allergies  Allergen Reactions   Sulfa Antibiotics     Past Surgical History:  Procedure Laterality Date   BREAST BIOPSY Bilateral    negative   BREAST EXCISIONAL BIOPSY Right    right positive 04/2010   BREAST LUMPECTOMY Right    removed a cancerous lump   VAGINAL HYSTERECTOMY      Social History   Tobacco Use   Smoking status: Former    Packs/day: 0.25    Years: 2.00    Total pack years: 0.50    Types: Cigarettes   Smokeless tobacco: Never   Tobacco comments:    smoking cessation materials not required  Vaping Use   Vaping Use: Never used  Substance Use Topics   Alcohol use: Not Currently   Drug use: Never     Medication list has been reviewed and updated.  Current Meds  Medication Sig   albuterol (VENTOLIN HFA) 108 (90 Base) MCG/ACT inhaler Inhale 2 puffs into the lungs every 6 (six) hours as needed for wheezing or shortness of breath. Include aerochamber   allopurinol (ZYLOPRIM) 100 MG tablet TAKE (1) TABLET BY MOUTH EVERY DAY   calcium carbonate (TUMS - DOSED IN MG ELEMENTAL CALCIUM) 500 MG chewable tablet Chew 1 tablet by mouth every 8 (eight) hours as needed for indigestion.   cetirizine (ZYRTEC) 10 MG tablet Take 10 mg by mouth daily. For allergies.   ferrous sulfate 325 (65 FE) MG tablet Take  325 mg by mouth daily with breakfast.   Fluticasone-Umeclidin-Vilant (TRELEGY ELLIPTA) 100-62.5-25 MCG/ACT AEPB Inhale 1 puff into the lungs daily.   losartan (COZAAR) 25 MG tablet Take 1 by mouth daily   lovastatin (MEVACOR) 40 MG tablet TAKE 1 TABLET EVERY DAY AT BEDTIME   meclizine (ANTIVERT) 12.5 MG tablet Take 1 tablet (12.5 mg total) by mouth 3 (three) times daily as needed for dizziness.   montelukast (SINGULAIR) 10 MG tablet Take 1 tablet (10 mg total) by mouth at bedtime.   mupirocin ointment (BACTROBAN) 2 % Apply 1 application topically 2 (two) times daily.   nystatin cream (MYCOSTATIN) APPLY 1 APPLICATION TOPICALLY TWICE DAILY   omeprazole (PRILOSEC) 20 MG capsule One a day   polyethylene glycol (MIRALAX / GLYCOLAX) 17 g packet Take 17 g by mouth daily as needed.   sertraline (ZOLOFT) 100 MG tablet Take 100 mg by mouth daily.   traZODone (DESYREL) 50 MG tablet TAKE 1 TABLET EVERY DAY AT BEDTIME       02/24/2022    8:40 AM 02/06/2022   11:21 AM 01/13/2022    9:06 AM 07/14/2021    9:42 AM  GAD 7 : Generalized Anxiety Score  Nervous, Anxious, on Edge 0 0 0 0  Control/stop worrying 0 0 0 0  Worry too much - different things 0 0 0 0  Trouble relaxing 0 0 0 0  Restless 0 0 0 0  Easily annoyed or irritable 0 0 0 0  Afraid - awful might happen 0 0 0 0  Total GAD 7 Score 0 0 0 0  Anxiety Difficulty Not difficult at all Not difficult at all Not difficult at all Not difficult at all       02/24/2022    8:40 AM 02/06/2022   11:21 AM 01/13/2022    9:03 AM  Depression screen PHQ 2/9  Decreased Interest 0 0 0  Down, Depressed, Hopeless 0 0 1  PHQ - 2 Score 0 0 1  Altered sleeping 0 0 0  Tired, decreased energy 0 0 0  Change in appetite 0 0 0  Feeling bad or failure about yourself  0 0 0  Trouble concentrating 0 0 0  Moving slowly or fidgety/restless 0 0 0  Suicidal thoughts 0 0 0  PHQ-9 Score 0 0 1  Difficult doing work/chores Not difficult at all Not difficult at all Not  difficult at all    BP Readings from Last 3 Encounters:  03/31/22 128/68  02/24/22 102/62  02/06/22 120/80    Physical Exam Vitals and nursing note reviewed. Exam conducted with a chaperone present.  Constitutional:      General: She is not in acute distress.    Appearance: She is not diaphoretic.  HENT:     Head: Normocephalic and atraumatic.     Right Ear: Tympanic membrane and external ear normal.     Left Ear: Tympanic membrane and external ear normal.     Nose: Nose normal.  Eyes:     General:        Right eye: No discharge.        Left eye: No discharge.     Conjunctiva/sclera: Conjunctivae normal.     Pupils: Pupils are equal, round, and reactive to light.  Neck:     Thyroid: No thyromegaly.     Vascular: No JVD.  Cardiovascular:     Rate and Rhythm: Normal rate and regular rhythm.     Heart sounds: Normal heart sounds. No murmur heard.    No friction rub. No gallop.  Pulmonary:     Effort: Pulmonary effort is normal.     Breath sounds: Rhonchi present. No wheezing or rales.  Abdominal:     General: Bowel sounds are normal.     Palpations: Abdomen is soft. There is no mass.     Tenderness: There is no abdominal tenderness. There is no right CVA tenderness, left CVA tenderness, guarding or rebound.     Comments: No suprapubic tenderness  Musculoskeletal:        General: Normal range of motion.     Cervical back: Neck supple.  Lymphadenopathy:     Cervical: No cervical adenopathy.  Skin:    General: Skin is warm and dry.     Findings: Erythema present.  Neurological:     Mental Status: She is alert.     Wt Readings from Last 3 Encounters:  03/31/22 212 lb (96.2 kg)  02/24/22 211 lb (95.7 kg)  02/06/22 215 lb (97.5 kg)    BP 128/68   Pulse 94   Ht _0  (1.6 m)   Wt 212 lb (96.2 kg)   SpO2 96%   BMI 37.55 kg/m   Assessment and Plan:  1. Chronic obstructive pulmonary disease with acute exacerbation (HCC) Chronic.  Persistent.  Relatively  stable.  With some expiratory/inspiratory ratio increased.  This would suggest an acute exacerbation of her COPD and patient has not been using inhalers on a regular basis.  We have reemphasized to use her Trelegy once a day and albuterol at least twice a day.  This will probably resolve the cough but I have been encouraged her to pick up Mucinex DM for mucolytic action and suppression of the cough.  2. Candidiasis Chronic.  Episodic.  Patient has recurrent episodes of breakout of yeast infections we will refill her nystatin cream. - POCT urinalysis dipstick - mupirocin ointment (BACTROBAN) 2 %; Apply 1 Application topically 2 (two) times daily.  Dispense: 44 g; Refill: 3 - nystatin cream (MYCOSTATIN); APPLY 1 APPLICATION TOPICALLY TWICE DAILY  Dispense: 30 g; Refill: 1  3. Panniculitis Acute.  Episodic.  There is erythema and infection consistent with panniculitis in the abdominal area.  We will treat with combination of Bactroban and nystatin cream twice a day after cleaning area   Otilio Miu, MD

## 2022-04-21 ENCOUNTER — Ambulatory Visit: Payer: Self-pay | Admitting: *Deleted

## 2022-04-21 NOTE — Telephone Encounter (Signed)
  Chief Complaint: pain in vulvar area- not sure cause Symptoms: mother complaining of " pain down there" Frequency: unsure Pertinent Negatives: Patient denies   Disposition: '[]'$ ED /'[]'$ Urgent Care (no appt availability in office) / '[x]'$ Appointment(In office/virtual)/ '[]'$  [facility]/ '[]'$ Home Care/ '[]'$ Refused Recommended Disposition /'[]'$ [place] [facility]/ '[]'$  Follow-up with PCP Additional Notes: Son would like to come to office for urinary collection "hat' to try to get specimen for visit tomorrow- advised I would send message for provider response. Patient is incontinent and has trouble collecting specimen.

## 2022-04-21 NOTE — Telephone Encounter (Signed)
Summary: Possible UTI/Requesting Rx   Pt son stated pt has a possible UTI. Stated recently had an antibiotic, and this may be why. Stated pt said she is having some pain down there and is not wanting to come in. Pt requesting Rx be sent.  Scheduled an appointment for tomorrow 04/22/2022 per request.  Please call son [name] directly pt will not answer the phone.  Seeking clinical advice.         Answer Assessment - Initial Assessment Questions 1. LOCATION: "Where does it hurt?"      Patient's son is calling- mother states she is having "pain down there" Patient 's son is not specific about where pain is and has made appointment- due to incontinence of urine - it has been difficult to get urine sample. He wants to know if a collection hat would be helpful in getting sample- he can pick up today and if able to collect can bring to office for appointment tomorrow. Advised will ask provider.  Protocols used: Pelvic Pain - Female-A-AH

## 2022-04-22 ENCOUNTER — Ambulatory Visit (INDEPENDENT_AMBULATORY_CARE_PROVIDER_SITE_OTHER): Payer: Medicare HMO | Admitting: Family Medicine

## 2022-04-22 ENCOUNTER — Encounter: Payer: Self-pay | Admitting: Family Medicine

## 2022-04-22 VITALS — BP 110/64 | HR 68 | Ht 63.0 in | Wt 206.0 lb

## 2022-04-22 DIAGNOSIS — N3090 Cystitis, unspecified without hematuria: Secondary | ICD-10-CM | POA: Diagnosis not present

## 2022-04-22 DIAGNOSIS — B372 Candidiasis of skin and nail: Secondary | ICD-10-CM

## 2022-04-22 DIAGNOSIS — R350 Frequency of micturition: Secondary | ICD-10-CM

## 2022-04-22 DIAGNOSIS — B379 Candidiasis, unspecified: Secondary | ICD-10-CM

## 2022-04-22 LAB — POCT URINALYSIS DIPSTICK
Bilirubin, UA: NEGATIVE
Glucose, UA: NEGATIVE
Ketones, UA: NEGATIVE
Nitrite, UA: POSITIVE
Protein, UA: POSITIVE — AB
Spec Grav, UA: 1.02 (ref 1.010–1.025)
Urobilinogen, UA: 0.2 E.U./dL
pH, UA: 7 (ref 5.0–8.0)

## 2022-04-22 MED ORDER — NYSTATIN 100000 UNIT/GM EX CREA: TOPICAL_CREAM | CUTANEOUS | 1 refills | Status: AC

## 2022-04-22 MED ORDER — MUPIROCIN 2 % EX OINT: 1.0000 | TOPICAL_OINTMENT | Freq: Two times a day (BID) | CUTANEOUS | 3 refills | Status: AC

## 2022-04-22 MED ORDER — CEPHALEXIN 500 MG PO CAPS: 500.0000 mg | ORAL_CAPSULE | Freq: Three times a day (TID) | ORAL | 0 refills | Status: DC

## 2022-04-22 MED ORDER — FLUCONAZOLE 150 MG PO TABS: 150.0000 mg | ORAL_TABLET | ORAL | 0 refills | Status: DC

## 2022-04-22 NOTE — Progress Notes (Signed)
Date:  04/22/2022   Name:  Kirsten Snyder   DOB:  Jun 27, 1933   MRN:  [id number]   Chief Complaint: urine frequency  Urinary Frequency  This is a recurrent problem. The current episode started in the past 7 days. The problem occurs every urination. The quality of the pain is described as burning. The pain is moderate. There has been no fever. Associated symptoms include frequency and urgency. Pertinent negatives include no hematuria. She has tried nothing for the symptoms.    Lab Results  Component Value Date   NA 143 01/13/2022   K 4.4 01/13/2022   CO2 26 01/13/2022   GLUCOSE 106 (H) 01/13/2022   BUN 12 01/13/2022   CREATININE 0.82 01/13/2022   CALCIUM 9.5 01/13/2022   EGFR 70 01/13/2022   GFRNONAA >60 03/01/2020   Lab Results  Component Value Date   CHOL 173 01/13/2022   HDL 48 01/13/2022   LDLCALC 105 (H) 01/13/2022   TRIG 112 01/13/2022   CHOLHDL 4.8 (H) 11/02/2017   Lab Results  Component Value Date   TSH 2.39 09/15/2012   Lab Results  Component Value Date   HGBA1C 5.6 01/13/2022   Lab Results  Component Value Date   WBC 6.4 02/24/2022   HGB 13.0 02/24/2022   HCT 38.7 02/24/2022   MCV 92.1 02/24/2022   PLT 259 02/24/2022   Lab Results  Component Value Date   ALT 10 01/13/2022   AST 14 01/13/2022   ALKPHOS 75 01/13/2022   BILITOT 0.9 01/13/2022   No results found for: "25OHVITD2", "25OHVITD3", "VD25OH"   Review of Systems  Respiratory:  Negative for shortness of breath and wheezing.   Gastrointestinal:  Negative for abdominal pain and blood in stool.  Genitourinary:  Positive for dysuria, frequency and urgency. Negative for hematuria.  Skin:  Positive for rash.    Patient Active Problem List   Diagnosis Date Noted   Anxiety 03/04/2021   COPD (chronic obstructive pulmonary disease) ([place]) 02/12/2021   H/O total hysterectomy 08/05/2020   UTI (urinary tract infection) 02/27/2020   Fall at home, initial encounter 02/27/2020   S/P resection of  meningioma 10/31/2019   Pulmonary emboli ([place]) 10/31/2019   Sepsis secondary to UTI ([place]) 10/01/2019   Sepsis ([place]) 10/01/2019   Hypokalemia 10/01/2019   Urinary urgency 09/15/2019   Atherosclerosis of abdominal aorta ([place]) 10/12/2018   SOBOE (shortness of breath on exertion) 10/12/2018   Osteopenia of neck of right femur 06/29/2018   Breast cancer, right ([place]) 06/17/2016   Essential hypertension 12/13/2015   Breast abscess of female 11/07/2015   Familial multiple lipoprotein-type hyperlipidemia 10/25/2014   Clinical depression 10/25/2014   Acid reflux 10/25/2014   Acute gastrointestinal bleeding 10/25/2014   Aggrieved 10/25/2014   Airway hyperreactivity 10/25/2014   Arthritis of knee 10/25/2014   H/O: gout 10/25/2014   Adiposity 10/25/2014   Arthritis, degenerative 10/25/2014   H/O adenomatous polyp of colon 03/07/2014    Allergies  Allergen Reactions   Sulfa Antibiotics     Past Surgical History:  Procedure Laterality Date   BREAST BIOPSY Bilateral    negative   BREAST EXCISIONAL BIOPSY Right    right positive 04/2010   BREAST LUMPECTOMY Right    removed a cancerous lump   VAGINAL HYSTERECTOMY      Social History   Tobacco Use   Smoking status: Former    Packs/day: 0.25    Years: 2.00    Total pack years: 0.50  Types: Cigarettes   Smokeless tobacco: Never   Tobacco comments:    smoking cessation materials not required  Vaping Use   Vaping Use: Never used  Substance Use Topics   Alcohol use: Not Currently   Drug use: Never     Medication list has been reviewed and updated.  Current Meds  Medication Sig   albuterol (VENTOLIN HFA) 108 (90 Base) MCG/ACT inhaler Inhale 2 puffs into the lungs every 6 (six) hours as needed for wheezing or shortness of breath. Include aerochamber   allopurinol (ZYLOPRIM) 100 MG tablet TAKE (1) TABLET BY MOUTH EVERY DAY   calcium carbonate (TUMS - DOSED IN MG ELEMENTAL CALCIUM) 500 MG chewable tablet Chew 1 tablet by mouth  every 8 (eight) hours as needed for indigestion.   cetirizine (ZYRTEC) 10 MG tablet Take 10 mg by mouth daily. For allergies.   ferrous sulfate 325 (65 FE) MG tablet Take 325 mg by mouth daily with breakfast.   Fluticasone-Umeclidin-Vilant (TRELEGY ELLIPTA) 100-62.5-25 MCG/ACT AEPB Inhale 1 puff into the lungs daily.   losartan (COZAAR) 25 MG tablet Take 1 by mouth daily   lovastatin (MEVACOR) 40 MG tablet TAKE 1 TABLET EVERY DAY AT BEDTIME   meclizine (ANTIVERT) 12.5 MG tablet Take 1 tablet (12.5 mg total) by mouth 3 (three) times daily as needed for dizziness.   montelukast (SINGULAIR) 10 MG tablet Take 1 tablet (10 mg total) by mouth at bedtime.   mupirocin ointment (BACTROBAN) 2 % Apply 1 Application topically 2 (two) times daily.   nystatin cream (MYCOSTATIN) APPLY 1 APPLICATION TOPICALLY TWICE DAILY   omeprazole (PRILOSEC) 20 MG capsule One a day   polyethylene glycol (MIRALAX / GLYCOLAX) 17 g packet Take 17 g by mouth daily as needed.   sertraline (ZOLOFT) 100 MG tablet Take 100 mg by mouth daily.   traZODone (DESYREL) 50 MG tablet TAKE 1 TABLET EVERY DAY AT BEDTIME       04/22/2022   10:30 AM 02/24/2022    8:40 AM 02/06/2022   11:21 AM 01/13/2022    9:06 AM  GAD 7 : Generalized Anxiety Score  Nervous, Anxious, on Edge 0 0 0 0  Control/stop worrying 0 0 0 0  Worry too much - different things 0 0 0 0  Trouble relaxing 0 0 0 0  Restless 0 0 0 0  Easily annoyed or irritable 0 0 0 0  Afraid - awful might happen 0 0 0 0  Total GAD 7 Score 0 0 0 0  Anxiety Difficulty Not difficult at all Not difficult at all Not difficult at all Not difficult at all       04/22/2022   10:30 AM 02/24/2022    8:40 AM 02/06/2022   11:21 AM  Depression screen PHQ 2/9  Decreased Interest 0 0 0  Down, Depressed, Hopeless 0 0 0  PHQ - 2 Score 0 0 0  Altered sleeping 0 0 0  Tired, decreased energy 0 0 0  Change in appetite 0 0 0  Feeling bad or failure about yourself  0 0 0  Trouble concentrating 0 0  0  Moving slowly or fidgety/restless 0 0 0  Suicidal thoughts 0 0 0  PHQ-9 Score 0 0 0  Difficult doing work/chores Not difficult at all Not difficult at all Not difficult at all    BP Readings from Last 3 Encounters:  04/22/22 110/64  03/31/22 128/68  02/24/22 102/62    Physical Exam Vitals and nursing note reviewed. Exam  conducted with a chaperone present.  Constitutional:      General: She is not in acute distress.    Appearance: She is not diaphoretic.  HENT:     Head: Normocephalic and atraumatic.     Right Ear: Tympanic membrane and external ear normal.     Left Ear: Tympanic membrane and external ear normal.  Neck:     Thyroid: No thyromegaly.     Vascular: No JVD.  Cardiovascular:     Rate and Rhythm: Normal rate and regular rhythm.     Heart sounds: Normal heart sounds. No murmur heard.    No friction rub. No gallop.  Pulmonary:     Effort: Pulmonary effort is normal.     Breath sounds: Normal breath sounds. No wheezing, rhonchi or rales.  Abdominal:     General: Bowel sounds are normal.     Palpations: Abdomen is soft. There is no mass.     Tenderness: There is no abdominal tenderness. There is no right CVA tenderness, left CVA tenderness or guarding.  Musculoskeletal:     Cervical back: Normal range of motion and neck supple.  Lymphadenopathy:     Cervical: No cervical adenopathy.  Skin:    General: Skin is dry.  Neurological:     Mental Status: She is alert.     Wt Readings from Last 3 Encounters:  04/22/22 206 lb (93.4 kg)  03/31/22 212 lb (96.2 kg)  02/24/22 211 lb (95.7 kg)    BP 110/64   Pulse 68   Ht _0  (1.6 m)   Wt 206 lb (93.4 kg)   SpO2 97%   BMI 36.49 kg/m   Assessment and Plan:  1. Urine frequency New episode.  Recurrent.  Patient probably over the weekend developed UTI symptoms including frequency dysuria and incontinence.  Evaluation by dipstick notes that there is a urinary tract infection again and we will send for culture  and treat accordingly. - POCT urinalysis dipstick  2. Cystitis New onset.  Recurrent.  Relatively stable..  Dipstick urinalysis is consistent with cystitis and we will obtain culture which last time grew an E. coli that was sensitive to everything.  We will treat with cephalexin 500 mg 3 times a day for 7 days this is also because she has probably a secondary bacterial yeast infection of skin folds.  Given the recurrent nature of this with the urinary incontinence that is ensuing we will send to urology for evaluation. - Ambulatory referral to Urology - Urine Culture - cephALEXin (KEFLEX) 500 MG capsule; Take 1 capsule (500 mg total) by mouth 3 (three) times daily.  Dispense: 21 capsule; Refill: 0  3. Candida infection of flexural skin Recurrence.  Skin fold probably due to moisture and inability to maintain dryness in the skin folds.  We will refill Diflucan to take once a week for 3 weeks and refills on her nystatin and Bactroban. - fluconazole (DIFLUCAN) 150 MG tablet; Take 1 tablet (150 mg total) by mouth every 7 (seven) days.  Dispense: 3 tablet; Refill: 0    Otilio Miu, MD

## 2022-04-25 LAB — URINE CULTURE

## 2022-05-25 ENCOUNTER — Ambulatory Visit: Payer: Medicare HMO

## 2022-05-26 ENCOUNTER — Ambulatory Visit: Payer: Self-pay | Admitting: *Deleted

## 2022-05-26 NOTE — Telephone Encounter (Signed)
  Chief Complaint: UTI symptoms have returned.  She stopped taking the Keflex 500 mg when she started feeling better.   The symptoms have returned.   Son Yaret Hush called in for her.  Requesting a refill of the antibiotic to last until she sees the urologist this coming Monday.    (Pt. Did go back and finish taking the antibiotic when her symptoms came back) Symptoms: Burning, frequency, urgency Frequency: Got better while on antibiotic, stopped taking it and now symptoms have returned.   Pertinent Negatives: Patient denies N/A Disposition: '[]'$ ED /'[]'$ Urgent Care (no appt availability in office) / '[]'$ Appointment(In office/virtual)/ '[]'$  [place] [facility]/ '[]'$ Home Care/ '[]'$ Refused Recommended Disposition /'[]'$ [facility] [facility]/ '[x]'$  Follow-up with PCP Additional Notes: Message sent to Dr. Otilio Miu with request for refill of antibiotic to last until she sees the urologist this coming Monday since she is having symptoms and burning.

## 2022-05-26 NOTE — Telephone Encounter (Signed)
Message from Luciana Axe sent at 05/26/2022  8:51 AM EST  Summary: Medication Refill with continued sx   Pts son is calling to ask if the patient could receive a refill of cephALEXin (KEFLEX) 500 MG capsule [[id number]]. Pt is still in pain. However unable to get in with urology until next week. Please advise [place], [employer] - [place] [address] Phone: [phone] Fax: [phone] Hours: Not open 24 hours          Call History   Type Contact Phone/Fax User  05/26/2022 08:49 AM EST Phone (Incoming) Kats,Dwight (Emergency Contact) [phone] Blase Mess A   Reason for Disposition  [1] Painful urination AND [2] EITHER frequency or urgency AND [3] has on-call doctor    Requesting refill on antibiotic until sees urologist this coming Monday.  Answer Assessment - Initial Assessment Questions 1. SEVERITY: "How bad is the pain?"  (e.g., Scale 1-10; mild, moderate, or severe)   - MILD (1-3): complains slightly about urination hurting   - MODERATE (4-7): interferes with normal activities     - SEVERE (8-10): excruciating, unwilling or unable to urinate because of the pain      I returned call to son Laticha Ferrucci.  His mother has an appt. With urologist on this coming Monday.    She did not take all the medicine that was prescribed by Dr. [name] for UTI.    "She was feeling better so she stopped taking it".    She was feeling better but now all the symptoms have returned, burning, frequency, urgency.     She did start back on the antibiotic and finished what was left in the bottle. [name] is wanting to know if Dr. [name] would give her a refill of the antibiotic to last her until her appt. On Monday with the urologist.   2. FREQUENCY: "How many times have you had painful urination today?"      She is having burning with urination 3. PATTERN: "Is pain present every time you urinate or just sometimes?"      Not asked 4. ONSET: "When did the  painful urination start?"      Already being treated for UTI.   Seen by Dr. [name] on 04/22/2022 5. FEVER: "Do you have a fever?" If Yes, ask: "What is your temperature, how was it measured, and when did it start?"     Not asked 6. PAST UTI: "Have you had a urine infection before?" If Yes, ask: "When was the last time?" and "What happened that time?"      Yes many times 7. CAUSE: "What do you think is causing the painful urination?"  (e.g., UTI, scratch, Herpes sore)     UTI    She stopped taking the antibiotic when she started feeling better and now her symptoms have returned.    She did go back and take what was left of the antibiotic after having stopped it.   8. OTHER SYMPTOMS: "Do you have any other symptoms?" (e.g., blood in urine, flank pain, genital sores, urgency, vaginal discharge)     Urgency, burning, frequency 9. PREGNANCY: "Is there any chance you are pregnant?" "When was your last menstrual period?"     N/A due to age  Protocols used: Urination Pain - Female-A-AH

## 2022-05-28 ENCOUNTER — Ambulatory Visit (INDEPENDENT_AMBULATORY_CARE_PROVIDER_SITE_OTHER): Payer: Medicare HMO

## 2022-05-28 VITALS — Ht 63.0 in | Wt 206.0 lb

## 2022-05-28 DIAGNOSIS — Z0000 Encounter for general adult medical examination without abnormal findings: Secondary | ICD-10-CM

## 2022-05-28 NOTE — Patient Instructions (Signed)
Kirsten Snyder , Thank you for taking time to come for your Medicare Wellness Visit. I appreciate your ongoing commitment to your health goals. Please review the following plan we discussed and let me know if I can assist you in the future.   These are the goals we discussed:  Goals      DIET - EAT MORE FRUITS AND VEGETABLES     DIET - INCREASE WATER INTAKE     Recommend to drink at least 6-8 8oz glasses of water per day.        This is a list of the screening recommended for you and due dates:  Health Maintenance  Topic Date Due   Zoster (Shingles) Vaccine (1 of 2) Never done   Mammogram  08/07/2021   COVID-19 Vaccine (5 - 2023-24 season) 12/19/2021   Medicare Annual Wellness Visit  05/29/2023   DTaP/Tdap/Td vaccine (2 - Td or Tdap) 05/19/2025   Pneumonia Vaccine  Completed   Flu Shot  Completed   DEXA scan (bone density measurement)  Completed   HPV Vaccine  Aged Out    Advanced directives: no  Conditions/risks identified: none  Next appointment: Follow up in one year for your annual wellness visit 06/03/23 @ 9:00 am by phone   Preventive Care 65 Years and Older, Female Preventive care refers to lifestyle choices and visits with your health care provider that can promote health and wellness. What does preventive care include? A yearly physical exam. This is also called an annual well check. Dental exams once or twice a year. Routine eye exams. Ask your health care provider how often you should have your eyes checked. Personal lifestyle choices, including: Daily care of your teeth and gums. Regular physical activity. Eating a healthy diet. Avoiding tobacco and drug use. Limiting alcohol use. Practicing safe sex. Taking low-dose aspirin every day. Taking vitamin and mineral supplements as recommended by your health care provider. What happens during an annual well check? The services and screenings done by your health care provider during your annual well check will  depend on your age, overall health, lifestyle risk factors, and family history of disease. Counseling  Your health care provider may ask you questions about your: Alcohol use. Tobacco use. Drug use. Emotional well-being. Home and relationship well-being. Sexual activity. Eating habits. History of falls. Memory and ability to understand (cognition). Work and work Statistician. Reproductive health. Screening  You may have the following tests or measurements: Height, weight, and BMI. Blood pressure. Lipid and cholesterol levels. These may be checked every 5 years, or more frequently if you are over 74 years old. Skin check. Lung cancer screening. You may have this screening every year starting at age 81 if you have a 30-pack-year history of smoking and currently smoke or have quit within the past 15 years. Fecal occult blood test (FOBT) of the stool. You may have this test every year starting at age 84. Flexible sigmoidoscopy or colonoscopy. You may have a sigmoidoscopy every 5 years or a colonoscopy every 10 years starting at age 26. Hepatitis C blood test. Hepatitis B blood test. Sexually transmitted disease (STD) testing. Diabetes screening. This is done by checking your blood sugar (glucose) after you have not eaten for a while (fasting). You may have this done every 1-3 years. Bone density scan. This is done to screen for osteoporosis. You may have this done starting at age 62. Mammogram. This may be done every 1-2 years. Talk to your health care provider about how  often you should have regular mammograms. Talk with your health care provider about your test results, treatment options, and if necessary, the need for more tests. Vaccines  Your health care provider may recommend certain vaccines, such as: Influenza vaccine. This is recommended every year. Tetanus, diphtheria, and acellular pertussis (Tdap, Td) vaccine. You may need a Td booster every 10 years. Zoster vaccine. You may  need this after age 16. Pneumococcal 13-valent conjugate (PCV13) vaccine. One dose is recommended after age 75. Pneumococcal polysaccharide (PPSV23) vaccine. One dose is recommended after age 69. Talk to your health care provider about which screenings and vaccines you need and how often you need them. This information is not intended to replace advice given to you by your health care provider. Make sure you discuss any questions you have with your health care provider. Document Released: 05/03/2015 Document Revised: 12/25/2015 Document Reviewed: 02/05/2015 Elsevier Interactive Patient Education  2017 [employer] Prevention in the Home Falls can cause injuries. They can happen to people of all ages. There are many things you can do to make your home safe and to help prevent falls. What can I do on the outside of my home? Regularly fix the edges of walkways and driveways and fix any cracks. Remove anything that might make you trip as you walk through a door, such as a raised step or threshold. Trim any bushes or trees on the path to your home. Use bright outdoor lighting. Clear any walking paths of anything that might make someone trip, such as rocks or tools. Regularly check to see if handrails are loose or broken. Make sure that both sides of any steps have handrails. Any raised decks and porches should have guardrails on the edges. Have any leaves, snow, or ice cleared regularly. Use sand or salt on walking paths during winter. Clean up any spills in your garage right away. This includes oil or grease spills. What can I do in the bathroom? Use night lights. Install grab bars by the toilet and in the tub and shower. Do not use towel bars as grab bars. Use non-skid mats or decals in the tub or shower. If you need to sit down in the shower, use a plastic, non-slip stool. Keep the floor dry. Clean up any water that spills on the floor as soon as it happens. Remove soap buildup in  the tub or shower regularly. Attach bath mats securely with double-sided non-slip rug tape. Do not have throw rugs and other things on the floor that can make you trip. What can I do in the bedroom? Use night lights. Make sure that you have a light by your bed that is easy to reach. Do not use any sheets or blankets that are too big for your bed. They should not hang down onto the floor. Have a firm chair that has side arms. You can use this for support while you get dressed. Do not have throw rugs and other things on the floor that can make you trip. What can I do in the kitchen? Clean up any spills right away. Avoid walking on wet floors. Keep items that you use a lot in easy-to-reach places. If you need to reach something above you, use a strong step stool that has a grab bar. Keep electrical cords out of the way. Do not use floor polish or wax that makes floors slippery. If you must use wax, use non-skid floor wax. Do not have throw rugs and other things  on the floor that can make you trip. What can I do with my stairs? Do not leave any items on the stairs. Make sure that there are handrails on both sides of the stairs and use them. Fix handrails that are broken or loose. Make sure that handrails are as long as the stairways. Check any carpeting to make sure that it is firmly attached to the stairs. Fix any carpet that is loose or worn. Avoid having throw rugs at the top or bottom of the stairs. If you do have throw rugs, attach them to the floor with carpet tape. Make sure that you have a light switch at the top of the stairs and the bottom of the stairs. If you do not have them, ask someone to add them for you. What else can I do to help prevent falls? Wear shoes that: Do not have high heels. Have rubber bottoms. Are comfortable and fit you well. Are closed at the toe. Do not wear sandals. If you use a stepladder: Make sure that it is fully opened. Do not climb a closed  stepladder. Make sure that both sides of the stepladder are locked into place. Ask someone to hold it for you, if possible. Clearly mark and make sure that you can see: Any grab bars or handrails. First and last steps. Where the edge of each step is. Use tools that help you move around (mobility aids) if they are needed. These include: Canes. Walkers. Scooters. Crutches. Turn on the lights when you go into a dark area. Replace any light bulbs as soon as they burn out. Set up your furniture so you have a clear path. Avoid moving your furniture around. If any of your floors are uneven, fix them. If there are any pets around you, be aware of where they are. Review your medicines with your doctor. Some medicines can make you feel dizzy. This can increase your chance of falling. Ask your doctor what other things that you can do to help prevent falls. This information is not intended to replace advice given to you by your health care provider. Make sure you discuss any questions you have with your health care provider. Document Released: 01/31/2009 Document Revised: 09/12/2015 Document Reviewed: 05/11/2014 Elsevier Interactive Patient Education  2017 [employer].

## 2022-05-28 NOTE — Progress Notes (Signed)
Virtual Visit via Telephone Note  I connected with  Kirsten Snyder on 05/28/22 at  9:30 AM EST by telephone and verified that I am speaking with the correct person using two identifiers.  Location: Patient: home Provider: [facility] Persons participating in the virtual visit: [place]   I discussed the limitations, risks, security and privacy concerns of performing an evaluation and management service by telephone and the availability of in person appointments. The patient expressed understanding and agreed to proceed.  Interactive audio and video telecommunications were attempted between this nurse and patient, however failed, due to patient having technical difficulties OR patient did not have access to video capability.  We continued and completed visit with audio only.  Some vital signs may be absent or patient reported.   Dionisio David, LPN  Subjective:   Kirsten Snyder is a 89 y.o. female who presents for Medicare Annual (Subsequent) preventive examination.  Review of Systems     Cardiac Risk Factors include: advanced age (>85mn, >>55women);hypertension;dyslipidemia;obesity (BMI >30kg/m2)     Objective:    There were no vitals filed for this visit. There is no height or weight on file to calculate BMI.     05/28/2022    9:35 AM 06/11/2021    3:25 PM 05/21/2021    2:07 PM 05/20/2020    2:19 PM 02/28/2020    5:00 PM 10/01/2019    1:00 PM 05/10/2019    1:43 PM  Advanced Directives  Does Patient Have a Medical Advance Directive? No Yes Yes Yes Yes Yes No  Type of ACorporate treasurerof ACove NeckLiving will H[place]Living will Healthcare Power of [place]  Does patient want to make changes to medical advance directive?     No - Patient declined No - Patient declined   Copy of HWickesin Chart?   No - copy requested No - copy requested No - copy  requested No - copy requested   Would patient like information on creating a medical advance directive? No - Patient declined      No - Patient declined    Current Medications (verified) Outpatient Encounter Medications as of 05/28/2022  Medication Sig   albuterol (VENTOLIN HFA) 108 (90 Base) MCG/ACT inhaler Inhale 2 puffs into the lungs every 6 (six) hours as needed for wheezing or shortness of breath. Include aerochamber   allopurinol (ZYLOPRIM) 100 MG tablet TAKE (1) TABLET BY MOUTH EVERY DAY   calcium carbonate (TUMS - DOSED IN MG ELEMENTAL CALCIUM) 500 MG chewable tablet Chew 1 tablet by mouth every 8 (eight) hours as needed for indigestion.   cephALEXin (KEFLEX) 500 MG capsule Take 1 capsule (500 mg total) by mouth 3 (three) times daily.   cetirizine (ZYRTEC) 10 MG tablet Take 10 mg by mouth daily. For allergies.   ferrous sulfate 325 (65 FE) MG tablet Take 325 mg by mouth daily with breakfast.   Fluticasone-Umeclidin-Vilant (TRELEGY ELLIPTA) 100-62.5-25 MCG/ACT AEPB Inhale 1 puff into the lungs daily.   losartan (COZAAR) 25 MG tablet Take 1 by mouth daily   lovastatin (MEVACOR) 40 MG tablet TAKE 1 TABLET EVERY DAY AT BEDTIME   meclizine (ANTIVERT) 12.5 MG tablet Take 1 tablet (12.5 mg total) by mouth 3 (three) times daily as needed for dizziness.   montelukast (SINGULAIR) 10 MG tablet Take 1 tablet (10 mg total) by mouth at bedtime.   mupirocin ointment (BACTROBAN)  2 % Apply 1 Application topically 2 (two) times daily.   nystatin cream (MYCOSTATIN) APPLY 1 APPLICATION TOPICALLY TWICE DAILY   omeprazole (PRILOSEC) 20 MG capsule One a day   polyethylene glycol (MIRALAX / GLYCOLAX) 17 g packet Take 17 g by mouth daily as needed.   sertraline (ZOLOFT) 100 MG tablet Take 100 mg by mouth daily.   traZODone (DESYREL) 50 MG tablet TAKE 1 TABLET EVERY DAY AT BEDTIME   fluconazole (DIFLUCAN) 150 MG tablet Take 1 tablet (150 mg total) by mouth every 7 (seven) days. (Patient not taking: Reported on  05/28/2022)   No facility-administered encounter medications on file as of 05/28/2022.    Allergies (verified) Sulfa antibiotics   History: Past Medical History:  Diagnosis Date   Allergy    Asthma    Breast cancer ([place])    Cancer ([place]) 2012   right breast cancer lumpectomy with rad tx   Depression    Edema    GERD (gastroesophageal reflux disease)    Gout    Hyperlipidemia    Hypertension    Personal history of radiation therapy    Past Surgical History:  Procedure Laterality Date   BREAST BIOPSY Bilateral    negative   BREAST EXCISIONAL BIOPSY Right    right positive 04/2010   BREAST LUMPECTOMY Right    removed a cancerous lump   VAGINAL HYSTERECTOMY     Family History  Problem Relation Age of Onset   Leukemia Mother    Heart disease Father    Breast cancer Neg Hx    Social History   Socioeconomic History   Marital status: Widowed    Spouse name: Not on file   Number of children: 2   Years of education: 12+   Highest education level: Some college, no degree  Occupational History   Occupation: Retired  Tobacco Use   Smoking status: Former    Packs/day: 0.25    Years: 2.00    Total pack years: 0.50    Types: Cigarettes   Smokeless tobacco: Never   Tobacco comments:    smoking cessation materials not required  Vaping Use   Vaping Use: Never used  Substance and Sexual Activity   Alcohol use: Not Currently   Drug use: Never   Sexual activity: Not Currently    Partners: Male  Other Topics Concern   Not on file  Social History Narrative   Granddaughter lives at home with patient.   Social Determinants of Health   Financial Resource Strain: Low Risk  (05/28/2022)   Overall Financial Resource Strain (CARDIA)    Difficulty of Paying Living Expenses: Not hard at all  Food Insecurity: No Food Insecurity (05/28/2022)   Hunger Vital Sign    Worried About Running Out of Food in the Last Year: Never true    Ran Out of Food in the Last Year: Never true   Transportation Needs: No Transportation Needs (05/28/2022)   PRAPARE - Hydrologist (Medical): No    Lack of Transportation (Non-Medical): No  Physical Activity: Insufficiently Active (05/28/2022)   Exercise Vital Sign    Days of Exercise per Week: 2 days    Minutes of Exercise per Session: 20 min  Stress: No Stress Concern Present (05/28/2022)   [place]    Feeling of Stress : Not at all  Social Connections: Socially Isolated (05/28/2022)   Social Connection and Isolation Panel [NHANES]  Frequency of Communication with Friends and Family: More than three times a week    Frequency of Social Gatherings with Friends and Family: More than three times a week    Attends Religious Services: Never    Marine scientist or Organizations: No    Attends Archivist Meetings: Never    Marital Status: Widowed    Tobacco Counseling Counseling given: Not Answered Tobacco comments: smoking cessation materials not required   Clinical Intake:  Pre-visit preparation completed: Yes  Pain : No/denies pain     Diabetes: No  How often do you need to have someone help you when you read instructions, pamphlets, or other written materials from your doctor or pharmacy?: 1 - Never  Diabetic?no  Interpreter Needed?: No  Information entered by :: Kirke Shaggy, LPN   Activities of Daily Living    05/28/2022    9:36 AM  In your present state of health, do you have any difficulty performing the following activities:  Hearing? 1  Vision? 0  Difficulty concentrating or making decisions? 0  Walking or climbing stairs? 1  Dressing or bathing? 0  Doing errands, shopping? 0  Preparing Food and eating ? N  Using the Toilet? N  In the past six months, have you accidently leaked urine? N  Do you have problems with loss of bowel control? N  Managing your Medications? N  Managing your Finances? N   Housekeeping or managing your Housekeeping? Y    Patient Care Team: [name], MD as PCP - General (Family Medicine)  Indicate any recent Medical Services you may have received from other than [facility] providers in the past year (date may be approximate).     Assessment:   This is a routine wellness examination for [place].  Hearing/Vision screen Hearing Screening - Comments:: No aids Vision Screening - Comments:: Wears glasses- [facility]  Dietary issues and exercise activities discussed: Current Exercise Habits: Home exercise routine, Type of exercise: walking, Time (Minutes): 20, Frequency (Times/Week): 2, Weekly Exercise (Minutes/Week): 40, Intensity: Mild   Goals Addressed             This Visit's Progress    DIET - EAT MORE FRUITS AND VEGETABLES         Depression Screen    05/28/2022    9:33 AM 05/28/2022    9:32 AM 04/22/2022   10:30 AM 02/24/2022    8:40 AM 02/06/2022   11:21 AM 01/13/2022    9:03 AM 07/14/2021    9:42 AM  PHQ 2/9 Scores  PHQ - 2 Score 1 0 0 0 0 1 0  PHQ- 9 Score  0 0 0 0 1 0    Fall Risk    05/28/2022    9:36 AM 04/22/2022   10:30 AM 02/24/2022    8:40 AM 02/06/2022   11:21 AM 01/13/2022    9:03 AM  Fall Risk   Falls in the past year? 1 1 0 0 0  Number falls in past yr: 1 1 0 0 0  Injury with Fall? 0 0 0 0 0  Risk for fall due to : History of fall(s) Impaired balance/gait;History of fall(s) Impaired balance/gait History of fall(s);Impaired balance/gait Impaired balance/gait;History of fall(s)  Follow up Falls evaluation completed;Falls prevention discussed Falls evaluation completed;Falls prevention discussed Falls evaluation completed;Falls prevention discussed Falls evaluation completed Falls evaluation completed;Falls prevention discussed    FALL RISK PREVENTION PERTAINING TO THE HOME:  Any stairs in or around the  home? No  If so, are there any without handrails? No  Home free of loose throw rugs in walkways, pet beds, electrical  cords, etc? Yes  Adequate lighting in your home to reduce risk of falls? Yes   ASSISTIVE DEVICES UTILIZED TO PREVENT FALLS:  Life alert? No  Use of a cane, walker or w/c? Yes  Grab bars in the bathroom? No  Shower chair or bench in shower? No  Elevated toilet seat or a handicapped toilet? No    Cognitive Function:        05/28/2022    9:37 AM 05/10/2019    1:46 PM 12/13/2017    2:01 PM  6CIT Screen  What Year? 0 points 0 points 0 points  What month? 0 points 0 points 0 points  What time? 0 points 0 points 0 points  Count back from 20 0 points 0 points 0 points  Months in reverse 4 points 4 points 0 points  Repeat phrase 4 points 4 points 2 points  Total Score 8 points 8 points 2 points    Immunizations Immunization History  Administered Date(s) Administered   Fluad Quad(high Dose 65+) 04/11/2019, 12/21/2019, 01/14/2021, 02/06/2022   Influenza, High Dose Seasonal PF 12/31/2016, 04/11/2019   Moderna Sars-Covid-2 Vaccination 07/14/2019, 08/08/2019   PFIZER(Purple Top)SARS-COV-2 Vaccination 07/14/2019, 08/08/2019   PPD Test 09/22/2019   Pneumococcal Conjugate-13 12/31/2016   Pneumococcal Polysaccharide-23 07/26/2018   Td,absorbed, Preservative Free, Adult Use, Lf Unspecified 05/20/2015   Tdap 05/20/2015    TDAP status: Up to date  Flu Vaccine status: Up to date  Pneumococcal vaccine status: Up to date  Covid-19 vaccine status: Completed vaccines  Qualifies for Shingles Vaccine? No   Zostavax completed No   Shingrix Completed?: No.    Education has been provided regarding the importance of this vaccine. Patient has been advised to call insurance company to determine out of pocket expense if they have not yet received this vaccine. Advised may also receive vaccine at local pharmacy or Health Dept. Verbalized acceptance and understanding.  Screening Tests Health Maintenance  Topic Date Due   Zoster Vaccines- Shingrix (1 of 2) Never done   MAMMOGRAM  08/07/2021    COVID-19 Vaccine (5 - 2023-24 season) 12/19/2021   Medicare Annual Wellness (AWV)  05/29/2023   DTaP/Tdap/Td (2 - Td or Tdap) 05/19/2025   Pneumonia Vaccine 52+ Years old  Completed   INFLUENZA VACCINE  Completed   DEXA SCAN  Completed   HPV VACCINES  Aged Out    Health Maintenance  Health Maintenance Due  Topic Date Due   Zoster Vaccines- Shingrix (1 of 2) Never done   MAMMOGRAM  08/07/2021   COVID-19 Vaccine (5 - 2023-24 season) 12/19/2021    Colorectal cancer screening: No longer required.   Mammogram status: Ordered 07/31/21. Pt provided with contact info and advised to call to schedule appt.   Lung Cancer Screening: (Low Dose CT Chest recommended if Age 49-80 years, 30 pack-year currently smoking OR have quit w/in 15years.) does not qualify.   Additional Screening:  Hepatitis C Screening: does not qualify; Completed no  Vision Screening: Recommended annual ophthalmology exams for early detection of glaucoma and other disorders of the eye. Is the patient up to date with their annual eye exam?  Yes  Who is the provider or what is the name of the office in which the patient attends annual eye exams? [facility] If pt is not established with a provider, would they like to be referred to  a provider to establish care? No .   Dental Screening: Recommended annual dental exams for proper oral hygiene  Community Resource Referral / Chronic Care Management: CRR required this visit?  No   CCM required this visit?  No      Plan:     I have personally reviewed and noted the following in the patient's chart:   Medical and social history Use of alcohol, tobacco or illicit drugs  Current medications and supplements including opioid prescriptions. Patient is not currently taking opioid prescriptions. Functional ability and status Nutritional status Physical activity Advanced directives List of other physicians Hospitalizations, surgeries, and ER visits in previous 12  months Vitals Screenings to include cognitive, depression, and falls Referrals and appointments  In addition, I have reviewed and discussed with patient certain preventive protocols, quality metrics, and best practice recommendations. A written personalized care plan for preventive services as well as general preventive health recommendations were provided to patient.     Dionisio David, LPN   06/21/4354   Nurse Notes: none

## 2022-06-01 ENCOUNTER — Other Ambulatory Visit: Payer: Self-pay

## 2022-06-01 DIAGNOSIS — N3090 Cystitis, unspecified without hematuria: Secondary | ICD-10-CM

## 2022-06-02 ENCOUNTER — Ambulatory Visit (INDEPENDENT_AMBULATORY_CARE_PROVIDER_SITE_OTHER): Payer: Medicare HMO | Admitting: Urology

## 2022-06-02 ENCOUNTER — Other Ambulatory Visit
Admission: RE | Admit: 2022-06-02 | Discharge: 2022-06-02 | Disposition: A | Discharge status: Home / Self Care | Payer: Medicare HMO | Attending: Urology | Admitting: Urology

## 2022-06-02 ENCOUNTER — Encounter: Payer: Self-pay | Admitting: Urology

## 2022-06-02 VITALS — BP 116/73 | HR 104 | Ht 63.0 in | Wt 206.0 lb

## 2022-06-02 DIAGNOSIS — N3941 Urge incontinence: Secondary | ICD-10-CM | POA: Diagnosis not present

## 2022-06-02 DIAGNOSIS — N390 Urinary tract infection, site not specified: Secondary | ICD-10-CM

## 2022-06-02 DIAGNOSIS — N3281 Overactive bladder: Secondary | ICD-10-CM

## 2022-06-02 DIAGNOSIS — R300 Dysuria: Secondary | ICD-10-CM

## 2022-06-02 DIAGNOSIS — N958 Other specified menopausal and perimenopausal disorders: Secondary | ICD-10-CM

## 2022-06-02 MED ORDER — GEMTESA 75 MG PO TABS: 75.0000 mg | ORAL_TABLET | Freq: Every day | ORAL | 0 refills | Status: DC

## 2022-06-02 MED ORDER — ESTRADIOL 0.1 MG/GM VA CREA: TOPICAL_CREAM | VAGINAL | 12 refills | Status: AC

## 2022-06-02 MED ORDER — NITROFURANTOIN MACROCRYSTAL 50 MG PO CAPS: 50.0000 mg | ORAL_CAPSULE | Freq: Every day | ORAL | 0 refills | Status: DC

## 2022-06-02 NOTE — Patient Instructions (Signed)

## 2022-06-02 NOTE — Progress Notes (Signed)
06/02/22 10:01 AM   Kirsten Snyder 12-08-1933 [id number]  CC: Recurrent UTI, overactive bladder  HPI: 89 year old female here with her son today who helps provide much of the history for the above issues.  She has had problems with recurrent UTIs, with multiple E. coli positive cultures over the last year, primary symptoms of UTI are dysuria and pelvic pain, and improved with antibiotics.  She denies gross hematuria.  She also has had at least a few years of worsening overactive bladder symptoms and urge incontinence day and night.  She has never tried medications for this.  She drinks water as well as some soda and flavored drinks during the day.  She was unable to void for urinalysis today.  Recent KUB in November 2023 showed no evidence of stones   PMH: Past Medical History:  Diagnosis Date   Allergy    Asthma    Breast cancer ([place])    Cancer ([place]) 2012   right breast cancer lumpectomy with rad tx   Depression    Edema    GERD (gastroesophageal reflux disease)    Gout    Hyperlipidemia    Hypertension    Personal history of radiation therapy     Surgical History: Past Surgical History:  Procedure Laterality Date   BREAST BIOPSY Bilateral    negative   BREAST EXCISIONAL BIOPSY Right    right positive 04/2010   BREAST LUMPECTOMY Right    removed a cancerous lump   VAGINAL HYSTERECTOMY      Family History: Family History  Problem Relation Age of Onset   Leukemia Mother    Heart disease Father    Breast cancer Neg Hx     Social History:  reports that she has quit smoking. Her smoking use included cigarettes. She has a 0.50 pack-year smoking history. She has been exposed to tobacco smoke. She has never used smokeless tobacco. She reports that she does not currently use alcohol. She reports that she does not use drugs.  Physical Exam: BP 116/73 (BP Location: Left Arm, Patient Position: Sitting, Cuff Size: Large)   Pulse (!) 104   Ht 5' 3"$  (1.6 m)   Wt 206 lb  (93.4 kg)   SpO2 91%   BMI 36.49 kg/m    Constitutional:  Alert and oriented, No acute distress. Cardiovascular: No clubbing, cyanosis, or edema. Respiratory: Normal respiratory effort, no increased work of breathing. GI: Abdomen is soft, nontender, nondistended, no abdominal masses   Laboratory Data: Reviewed, see epic, prior culture data reviewed  Assessment & Plan:   89 year old female with recurrent UTI over at least the last year as well as overactive bladder with significant urge incontinence day and night.  We discussed the evaluation and treatment of patients with recurrent UTIs at length.  We specifically discussed the differences between asymptomatic bacteriuria and true urinary tract infection.  We discussed the AUA definition of recurrent UTI of at least 2 culture proven symptomatic acute cystitis episodes in a 28-monthperiod, or 3 within a 1 year period.  We discussed the importance of culture directed antibiotic treatment, and antibiotic stewardship.  First-line therapy includes nitrofurantoin(5 days), Bactrim(3 days), or fosfomycin(3 g single dose).  Possible etiologies of recurrent infection include periurethral tissue atrophy in postmenopausal woman, constipation, sexual activity, incomplete emptying, anatomic abnormalities, and even genetic predisposition.  Finally, we discussed the role of perineal hygiene, timed voiding, adequate hydration, topical vaginal estrogen, cranberry prophylaxis, and low-dose antibiotic prophylaxis.  We discussed that overactive bladder (  OAB) is not a disease, but is a symptom complex that is generally not life-threatening.  Symptoms typically include urinary urgency, frequency, and urge incontinence.  There are numerous treatment options, however there are risks and benefits with both medical and surgical management.  First-line treatment is behavioral therapies including bladder training, pelvic floor muscle training, and fluid management.  Second  line treatments include oral antimuscarinics(Ditropan er, Trospium) and beta-3 agonist (Mybetriq). There is typically a period of medication trial (4-8 weeks) to find the optimal therapy and dosing. If symptoms are bothersome despite the above management, third line options include intra-detrusor botox, peripheral tibial nerve stimulation (PTNS), and interstim (SNS). These are more invasive treatments with higher side effect profile, but may improve quality of life for patients with severe OAB symptoms.   -Trial of topical estrogen cream, cranberry tablets, 3 months low-dose nitrofurantoin prophylaxis for recurrent infections -Trial of Gemtesa for OAB, samples given -RTC 6 weeks symptom check, recommend prescription for Gemtesa at that time if doing well on samples   Nickolas Madrid, MD 06/02/2022  [facility] [address] [place], [place] [address] [phone]

## 2022-06-08 ENCOUNTER — Ambulatory Visit: Payer: Medicare HMO | Admitting: Urology

## 2022-06-25 ENCOUNTER — Ambulatory Visit (INDEPENDENT_AMBULATORY_CARE_PROVIDER_SITE_OTHER): Payer: Medicare HMO | Admitting: Family Medicine

## 2022-06-25 ENCOUNTER — Encounter: Payer: Self-pay | Admitting: Family Medicine

## 2022-06-25 VITALS — BP 122/78 | HR 86 | Ht 63.0 in | Wt 204.0 lb

## 2022-06-25 DIAGNOSIS — M940 Chondrocostal junction syndrome [Tietze]: Secondary | ICD-10-CM

## 2022-06-25 DIAGNOSIS — J4520 Mild intermittent asthma, uncomplicated: Secondary | ICD-10-CM

## 2022-06-25 DIAGNOSIS — R051 Acute cough: Secondary | ICD-10-CM

## 2022-06-25 DIAGNOSIS — J40 Bronchitis, not specified as acute or chronic: Secondary | ICD-10-CM | POA: Diagnosis not present

## 2022-06-25 DIAGNOSIS — L989 Disorder of the skin and subcutaneous tissue, unspecified: Secondary | ICD-10-CM

## 2022-06-25 LAB — POC COVID19 BINAXNOW: SARS Coronavirus 2 Ag: NEGATIVE

## 2022-06-25 MED ORDER — CEPHALEXIN 500 MG PO CAPS: 500.0000 mg | ORAL_CAPSULE | Freq: Three times a day (TID) | ORAL | 0 refills | Status: DC

## 2022-06-25 MED ORDER — PREDNISONE 10 MG PO TABS: 10.0000 mg | ORAL_TABLET | Freq: Every day | ORAL | 0 refills | Status: DC

## 2022-06-25 NOTE — Progress Notes (Signed)
Date:  06/25/2022   Name:  Kirsten Snyder   DOB:  [birth year]   MRN:  [id number]   Chief Complaint: Cough (Started a few days ago - coughing- no production, no fever or sob. )  Cough This is a new problem. The current episode started in the past 7 days. The problem has been waxing and waning. The problem occurs every few minutes. The cough is Productive of sputum. Associated symptoms include chest pain, chills, nasal congestion, postnasal drip and rhinorrhea. Pertinent negatives include no ear pain, fever, headaches, rash, sore throat, shortness of breath, sweats or wheezing. The symptoms are aggravated by lying down. She has tried OTC cough suppressant for the symptoms. The treatment provided mild relief.    Lab Results  Component Value Date   NA 143 01/13/2022   K 4.4 01/13/2022   CO2 26 01/13/2022   GLUCOSE 106 (H) 01/13/2022   BUN 12 01/13/2022   CREATININE 0.82 01/13/2022   CALCIUM 9.5 01/13/2022   EGFR 70 01/13/2022   GFRNONAA >60 03/01/2020   Lab Results  Component Value Date   CHOL 173 01/13/2022   HDL 48 01/13/2022   LDLCALC 105 (H) 01/13/2022   TRIG 112 01/13/2022   CHOLHDL 4.8 (H) 11/02/2017   Lab Results  Component Value Date   TSH 2.39 09/15/2012   Lab Results  Component Value Date   HGBA1C 5.6 01/13/2022   Lab Results  Component Value Date   WBC 6.4 02/24/2022   HGB 13.0 02/24/2022   HCT 38.7 02/24/2022   MCV 92.1 02/24/2022   PLT 259 02/24/2022   Lab Results  Component Value Date   ALT 10 01/13/2022   AST 14 01/13/2022   ALKPHOS 75 01/13/2022   BILITOT 0.9 01/13/2022   No results found for: "25OHVITD2", "25OHVITD3", "VD25OH"   Review of Systems  Constitutional:  Positive for chills. Negative for fever.  HENT:  Positive for postnasal drip and rhinorrhea. Negative for ear pain, nosebleeds, sore throat and trouble swallowing.   Respiratory:  Positive for cough. Negative for chest tightness, shortness of breath and wheezing.   Cardiovascular:   Positive for chest pain. Negative for palpitations.  Gastrointestinal:  Negative for abdominal pain.  Endocrine: Negative for polydipsia and polyuria.  Genitourinary:  Negative for difficulty urinating.  Skin:  Negative for rash.  Neurological:  Negative for headaches.    Patient Active Problem List   Diagnosis Date Noted   Anxiety 03/04/2021   H/O total hysterectomy 08/05/2020   Fall at home, initial encounter 02/27/2020   S/P resection of meningioma 10/31/2019   Pulmonary emboli ([place]) 10/31/2019   Urinary tract infection, site not specified 10/03/2019   Sepsis secondary to UTI ([place]) 10/01/2019   Sepsis, unspecified organism ([place]) 10/01/2019   Hypokalemia 10/01/2019   Chronic obstructive pulmonary disease, unspecified ([place]) 09/22/2019   Difficulty in walking, not elsewhere classified 09/22/2019   Encounter for surgical aftercare following surgery on the nervous system 09/22/2019   Gout, unspecified 09/22/2019   Insomnia, unspecified 09/22/2019   Major depressive disorder, recurrent, unspecified ([place]) 09/22/2019   Muscle weakness (generalized) 09/22/2019   Need for assistance with personal care 09/22/2019   Other lack of coordination 09/22/2019   Other symptoms and signs involving cognitive functions and awareness 09/22/2019   Personal history of breast cancer 09/22/2019   Unsteadiness on feet 09/22/2019   Weakness 09/22/2019   Gastro-esophageal reflux disease without esophagitis 09/22/2019   Unspecified asthma, uncomplicated 0000000   Unspecified osteoarthritis, unspecified site 09/22/2019  Hyperlipidemia, unspecified 09/22/2019   Urinary urgency 09/15/2019   Atherosclerosis of abdominal aorta ([place]) 10/12/2018   SOBOE (shortness of breath on exertion) 10/12/2018   Osteopenia of neck of right femur 06/29/2018   Breast cancer, right ([place]) 06/17/2016   Essential (primary) hypertension 12/13/2015   Breast abscess of female 11/07/2015   Familial multiple lipoprotein-type  hyperlipidemia 10/25/2014   Clinical depression 10/25/2014   Acid reflux 10/25/2014   Acute gastrointestinal bleeding 10/25/2014   Aggrieved 10/25/2014   Airway hyperreactivity 10/25/2014   Arthritis of knee 10/25/2014   H/O: gout 10/25/2014   Adiposity 10/25/2014   Arthritis, degenerative 10/25/2014   H/O adenomatous polyp of colon 03/07/2014    Allergies  Allergen Reactions   Sulfa Antibiotics     Past Surgical History:  Procedure Laterality Date   BREAST BIOPSY Bilateral    negative   BREAST EXCISIONAL BIOPSY Right    right positive 04/2010   BREAST LUMPECTOMY Right    removed a cancerous lump   VAGINAL HYSTERECTOMY      Social History   Tobacco Use   Smoking status: Former    Packs/day: 0.25    Years: 2.00    Total pack years: 0.50    Types: Cigarettes    Passive exposure: Past   Smokeless tobacco: Never   Tobacco comments:    smoking cessation materials not required  Vaping Use   Vaping Use: Never used  Substance Use Topics   Alcohol use: Not Currently   Drug use: Never     Medication list has been reviewed and updated.  Current Meds  Medication Sig   albuterol (VENTOLIN HFA) 108 (90 Base) MCG/ACT inhaler Inhale 2 puffs into the lungs every 6 (six) hours as needed for wheezing or shortness of breath. Include aerochamber   allopurinol (ZYLOPRIM) 100 MG tablet TAKE (1) TABLET BY MOUTH EVERY DAY   calcium carbonate (TUMS - DOSED IN MG ELEMENTAL CALCIUM) 500 MG chewable tablet Chew 1 tablet by mouth every 8 (eight) hours as needed for indigestion.   cetirizine (ZYRTEC) 10 MG tablet Take 10 mg by mouth daily. For allergies.   estradiol (ESTRACE) 0.1 MG/GM vaginal cream Estrogen Cream Instruction Discard applicator Apply pea sized amount to tip of finger to urethra before bed. Wash hands well after application. Use Monday, Wednesday and Friday   ferrous sulfate 325 (65 FE) MG tablet Take 325 mg by mouth daily with breakfast.   Fluticasone-Umeclidin-Vilant  (TRELEGY ELLIPTA) 100-62.5-25 MCG/ACT AEPB Inhale 1 puff into the lungs daily.   losartan (COZAAR) 25 MG tablet Take 1 by mouth daily   lovastatin (MEVACOR) 40 MG tablet TAKE 1 TABLET EVERY DAY AT BEDTIME   meclizine (ANTIVERT) 12.5 MG tablet Take 1 tablet (12.5 mg total) by mouth 3 (three) times daily as needed for dizziness.   montelukast (SINGULAIR) 10 MG tablet Take 1 tablet (10 mg total) by mouth at bedtime.   mupirocin ointment (BACTROBAN) 2 % Apply 1 Application topically 2 (two) times daily.   nitrofurantoin (MACRODANTIN) 50 MG capsule Take 1 capsule (50 mg total) by mouth daily.   nystatin cream (MYCOSTATIN) APPLY 1 APPLICATION TOPICALLY TWICE DAILY   omeprazole (PRILOSEC) 20 MG capsule One a day   polyethylene glycol (MIRALAX / GLYCOLAX) 17 g packet Take 17 g by mouth daily as needed.   sertraline (ZOLOFT) 100 MG tablet Take 100 mg by mouth daily.   traZODone (DESYREL) 50 MG tablet TAKE 1 TABLET EVERY DAY AT BEDTIME   Vibegron (GEMTESA) 75 MG  TABS Take 1 tablet (75 mg total) by mouth daily.       04/22/2022   10:30 AM 02/24/2022    8:40 AM 02/06/2022   11:21 AM 01/13/2022    9:06 AM  GAD 7 : Generalized Anxiety Score  Nervous, Anxious, on Edge 0 0 0 0  Control/stop worrying 0 0 0 0  Worry too much - different things 0 0 0 0  Trouble relaxing 0 0 0 0  Restless 0 0 0 0  Easily annoyed or irritable 0 0 0 0  Afraid - awful might happen 0 0 0 0  Total GAD 7 Score 0 0 0 0  Anxiety Difficulty Not difficult at all Not difficult at all Not difficult at all Not difficult at all       05/28/2022    9:33 AM 05/28/2022    9:32 AM 04/22/2022   10:30 AM  Depression screen PHQ 2/9  Decreased Interest  0 0  Down, Depressed, Hopeless 1 0 0  PHQ - 2 Score 1 0 0  Altered sleeping  0 0  Tired, decreased energy  0 0  Change in appetite  0 0  Feeling bad or failure about yourself   0 0  Trouble concentrating  0 0  Moving slowly or fidgety/restless  0 0  Suicidal thoughts  0 0  PHQ-9 Score   0 0  Difficult doing work/chores Not difficult at all Not difficult at all Not difficult at all    BP Readings from Last 3 Encounters:  06/25/22 122/78  06/02/22 116/73  04/22/22 110/64    Physical Exam Vitals and nursing note reviewed. Exam conducted with a chaperone present.  Constitutional:      General: She is not in acute distress.    Appearance: She is not diaphoretic.  HENT:     Head: Normocephalic and atraumatic.     Right Ear: External ear normal.     Left Ear: External ear normal.     Nose: Nose normal.  Eyes:     General:        Right eye: No discharge.        Left eye: No discharge.     Conjunctiva/sclera: Conjunctivae normal.     Pupils: Pupils are equal, round, and reactive to light.  Neck:     Thyroid: No thyromegaly.     Vascular: No JVD.  Cardiovascular:     Rate and Rhythm: Normal rate and regular rhythm.     Heart sounds: Normal heart sounds, S1 normal and S2 normal. No murmur heard.    No systolic murmur is present.     No diastolic murmur is present.     No friction rub. No gallop. No S3 or S4 sounds.  Pulmonary:     Effort: Pulmonary effort is normal.     Breath sounds: Normal breath sounds. Decreased air movement present. No decreased breath sounds, wheezing, rhonchi or rales.  Abdominal:     General: Bowel sounds are normal.     Palpations: Abdomen is soft. There is no mass.     Tenderness: There is no abdominal tenderness. There is no guarding.  Musculoskeletal:        General: Normal range of motion.     Cervical back: Normal range of motion and neck supple.  Lymphadenopathy:     Cervical: No cervical adenopathy.     Upper Body:     Right upper body: No supraclavicular adenopathy.     Left  upper body: No supraclavicular adenopathy.  Skin:    General: Skin is warm and dry.     Findings: Lesion and wound present.  Neurological:     Mental Status: She is alert.     Deep Tendon Reflexes:     Reflex Scores:      Patellar reflexes are 1+ on  the right side and 1+ on the left side.    Wt Readings from Last 3 Encounters:  06/25/22 204 lb (92.5 kg)  06/02/22 206 lb (93.4 kg)  05/28/22 206 lb (93.4 kg)    BP 122/78   Pulse 86   Ht '5\' 3"'$  (1.6 m)   Wt 204 lb (92.5 kg)   SpO2 94%   BMI 36.14 kg/m   Assessment and Plan:  1. Acute cough Acute.  Episodic.  Stable.  Checking of COVID is negative.  This probably is representing some reactive airway disease which patient is probably not taking her Trelegy and albuterol on a regular basis.  We will initiate prednisone 10 mg once a day and encourage Mucinex DM. - POC COVID-19 BinaxNow - predniSONE (DELTASONE) 10 MG tablet; Take 1 tablet (10 mg total) by mouth daily with breakfast.  Dispense: 30 tablet; Refill: 0  2. Mild intermittent asthma without complication A bit of a noncompliance but patient has difficulty remembering and care taper may not be making sure she takes this on a regular basis patient must take her Trelegy daily and must be taken her albuterol with presumably a AeroChamber. - predniSONE (DELTASONE) 10 MG tablet; Take 1 tablet (10 mg total) by mouth daily with breakfast.  Dispense: 30 tablet; Refill: 0  3. Chest skin lesion Brings up that she has displaced on her chest that is odorous there is secondarily infected granulomatous lesion which I hope is not of a squamous cell variety.  We will treat the infection with cephalexin 500 mg 3 times a day for 10 days and refer to dermatology patient is to keep it covered. - cephALEXin (KEFLEX) 500 MG capsule; Take 1 capsule (500 mg total) by mouth 3 (three) times daily.  Dispense: 30 capsule; Refill: 0 - Ambulatory referral to Dermatology  4. Bronchitis Patient has a bronchitis presumably secondary to her reactive airway disease and has been suggested that she continue her Mucinex DM.  5. Costochondritis Point tenderness along the left costochondral margin at about 5 6 costochondral margin and patient has been suggested to  use an anti-inflammatory over-the-counter such as Advil.   Otilio Miu, MD

## 2022-07-09 NOTE — Progress Notes (Signed)
07/13/2022 4:49 PM   Kirsten Snyder [birth year] [id number]  Referring provider: [name], MD [address] [place],  [place] [address]  Urological history: 1. rUTI's -Contributing factors of age, vaginal atrophy, incontinence and constipation -documented urine cultures over the last year  04/22/2022 - E.coli   02/06/2022 - E.coli -vaginal estrogen cream, cranberry tablets and nitrofurantoin 100 mg daily  2. Urge incontinence -contributing factors of age, vaginal atrophy, history of smoking, hypertension, COPD, arthritis, depression and anxiety   No chief complaint on file.   HPI: Kirsten Snyder is a 89 y.o. female who presents today for 6 week follow up after a trial of Gemtesa.   PVR ***   PMH: Past Medical History:  Diagnosis Date   Allergy    Asthma    Breast cancer ([place])    Cancer ([place]) 2012   right breast cancer lumpectomy with rad tx   Depression    Edema    GERD (gastroesophageal reflux disease)    Gout    Hyperlipidemia    Hypertension    Personal history of radiation therapy     Surgical History: Past Surgical History:  Procedure Laterality Date   BREAST BIOPSY Bilateral    negative   BREAST EXCISIONAL BIOPSY Right    right positive 04/2010   BREAST LUMPECTOMY Right    removed a cancerous lump   VAGINAL HYSTERECTOMY      Home Medications:  Allergies as of 07/13/2022       Reactions   Sulfa Antibiotics         Medication List        Accurate as of July 09, 2022  4:49 PM. If you have any questions, ask your nurse or doctor.          albuterol 108 (90 Base) MCG/ACT inhaler Commonly known as: VENTOLIN HFA Inhale 2 puffs into the lungs every 6 (six) hours as needed for wheezing or shortness of breath. Include aerochamber   allopurinol 100 MG tablet Commonly known as: ZYLOPRIM TAKE (1) TABLET BY MOUTH EVERY DAY   calcium carbonate 500 MG chewable tablet Commonly known as: TUMS - dosed in mg elemental  calcium Chew 1 tablet by mouth every 8 (eight) hours as needed for indigestion.   cephALEXin 500 MG capsule Commonly known as: KEFLEX Take 1 capsule (500 mg total) by mouth 3 (three) times daily.   cetirizine 10 MG tablet Commonly known as: ZYRTEC Take 10 mg by mouth daily. For allergies.   estradiol 0.1 MG/GM vaginal cream Commonly known as: ESTRACE Estrogen Cream Instruction Discard applicator Apply pea sized amount to tip of finger to urethra before bed. Wash hands well after application. Use Monday, Wednesday and Friday   ferrous sulfate 325 (65 FE) MG tablet Take 325 mg by mouth daily with breakfast.   Gemtesa 75 MG Tabs Generic drug: Vibegron Take 1 tablet (75 mg total) by mouth daily.   losartan 25 MG tablet Commonly known as: COZAAR Take 1 by mouth daily   lovastatin 40 MG tablet Commonly known as: MEVACOR TAKE 1 TABLET EVERY DAY AT BEDTIME   meclizine 12.5 MG tablet Commonly known as: ANTIVERT Take 1 tablet (12.5 mg total) by mouth 3 (three) times daily as needed for dizziness.   montelukast 10 MG tablet Commonly known as: SINGULAIR Take 1 tablet (10 mg total) by mouth at bedtime.   mupirocin ointment 2 % Commonly known as: BACTROBAN Apply 1 Application topically 2 (two) times daily.  nitrofurantoin 50 MG capsule Commonly known as: MACRODANTIN Take 1 capsule (50 mg total) by mouth daily.   nystatin cream Commonly known as: MYCOSTATIN APPLY 1 APPLICATION TOPICALLY TWICE DAILY   omeprazole 20 MG capsule Commonly known as: PRILOSEC One a day   polyethylene glycol 17 g packet Commonly known as: MIRALAX / GLYCOLAX Take 17 g by mouth daily as needed.   predniSONE 10 MG tablet Commonly known as: DELTASONE Take 1 tablet (10 mg total) by mouth daily with breakfast.   sertraline 100 MG tablet Commonly known as: ZOLOFT Take 100 mg by mouth daily.   traZODone 50 MG tablet Commonly known as: DESYREL TAKE 1 TABLET EVERY DAY AT BEDTIME   Trelegy  Ellipta 100-62.5-25 MCG/ACT Aepb Generic drug: Fluticasone-Umeclidin-Vilant Inhale 1 puff into the lungs daily.        Allergies:  Allergies  Allergen Reactions   Sulfa Antibiotics     Family History: Family History  Problem Relation Age of Onset   Leukemia Mother    Heart disease Father    Breast cancer Neg Hx     Social History:  reports that she has quit smoking. Her smoking use included cigarettes. She has a 0.50 pack-year smoking history. She has been exposed to tobacco smoke. She has never used smokeless tobacco. She reports that she does not currently use alcohol. She reports that she does not use drugs.  ROS: Pertinent ROS in HPI  Physical Exam: There were no vitals taken for this visit.  Constitutional:  Well nourished. Alert and oriented, No acute distress. HEENT: [place] AT, moist mucus membranes.  Trachea midline, no masses. Cardiovascular: No clubbing, cyanosis, or edema. Respiratory: Normal respiratory effort, no increased work of breathing. GU: No CVA tenderness.  No bladder fullness or masses. Vulvovaginal atrophy w/ pallor, loss of rugae, introital retraction, excoriations.  Vulvar thinning, fusion of labia, clitoral hood retraction, prominent urethral meatus.   *** external genitalia, *** pubic hair distribution, no lesions.  Normal urethral meatus, no lesions, no prolapse, no discharge.   No urethral masses, tenderness and/or tenderness. No bladder fullness, tenderness or masses. *** vagina mucosa, *** estrogen effect, no discharge, no lesions, *** pelvic support, *** cystocele and *** rectocele noted.  No cervical motion tenderness.  Uterus is freely mobile and non-fixed.  No adnexal/parametria masses or tenderness noted.  Anus and perineum are without rashes or lesions.   ***  Neurologic: Grossly intact, no focal deficits, moving all 4 extremities. Psychiatric: Normal mood and affect.    Laboratory Data:  Serum creatinine (02/2022) 0.65 Lab Results  Component  Value Date   HGBA1C 5.6 01/13/2022      Component Value Date/Time   CHOL 173 01/13/2022 0953   HDL 48 01/13/2022 0953   CHOLHDL 4.8 (H) 11/02/2017 1202   LDLCALC 105 (H) 01/13/2022 0953   Lab Results  Component Value Date   AST 14 01/13/2022   Lab Results  Component Value Date   ALT 10 01/13/2022  I have reviewed the labs.   Pertinent Imaging: ***   Assessment & Plan:  ***  1. Urge incontinence ***  No follow-ups on file.  These notes generated with voice recognition software. I apologize for typographical errors.  [place], [place] [address]  [place], [place] [address] [phone]

## 2022-07-13 ENCOUNTER — Ambulatory Visit (INDEPENDENT_AMBULATORY_CARE_PROVIDER_SITE_OTHER): Payer: Medicare HMO | Admitting: Urology

## 2022-07-13 ENCOUNTER — Telehealth: Payer: Self-pay

## 2022-07-13 ENCOUNTER — Encounter: Payer: Self-pay | Admitting: Urology

## 2022-07-13 ENCOUNTER — Other Ambulatory Visit: Payer: Self-pay | Admitting: Urology

## 2022-07-13 VITALS — BP 125/77 | HR 91 | Ht 63.0 in | Wt 204.0 lb

## 2022-07-13 DIAGNOSIS — N3941 Urge incontinence: Secondary | ICD-10-CM | POA: Diagnosis not present

## 2022-07-13 DIAGNOSIS — N3281 Overactive bladder: Secondary | ICD-10-CM

## 2022-07-13 DIAGNOSIS — N952 Postmenopausal atrophic vaginitis: Secondary | ICD-10-CM | POA: Diagnosis not present

## 2022-07-13 LAB — BLADDER SCAN AMB NON-IMAGING: Scan Result: 0

## 2022-07-13 MED ORDER — TROSPIUM CHLORIDE 20 MG PO TABS: 20.0000 mg | ORAL_TABLET | Freq: Two times a day (BID) | ORAL | 0 refills | Status: DC

## 2022-07-13 MED ORDER — GEMTESA 75 MG PO TABS: 75.0000 mg | ORAL_TABLET | Freq: Every day | ORAL | 3 refills | Status: DC

## 2022-07-13 NOTE — Telephone Encounter (Signed)
Mr Shaikh, patient's son called stating that [facility] will be $95 for the patient for 30 day supply. They are not able to afford that and asking for other options.

## 2022-07-13 NOTE — Addendum Note (Signed)
Addended by: Kris Mouton on: 07/13/2022 03:25 PM   Modules accepted: Orders

## 2022-07-13 NOTE — Telephone Encounter (Signed)
Mr Poss advised. RX sent in.

## 2022-07-14 ENCOUNTER — Encounter: Payer: Self-pay | Admitting: Family Medicine

## 2022-07-14 ENCOUNTER — Ambulatory Visit (INDEPENDENT_AMBULATORY_CARE_PROVIDER_SITE_OTHER): Payer: Medicare HMO | Admitting: Family Medicine

## 2022-07-14 VITALS — BP 100/60 | HR 90 | Ht 63.0 in | Wt 205.0 lb

## 2022-07-14 DIAGNOSIS — K219 Gastro-esophageal reflux disease without esophagitis: Secondary | ICD-10-CM | POA: Diagnosis not present

## 2022-07-14 DIAGNOSIS — I10 Essential (primary) hypertension: Secondary | ICD-10-CM | POA: Diagnosis not present

## 2022-07-14 DIAGNOSIS — J4520 Mild intermittent asthma, uncomplicated: Secondary | ICD-10-CM

## 2022-07-14 DIAGNOSIS — E790 Hyperuricemia without signs of inflammatory arthritis and tophaceous disease: Secondary | ICD-10-CM | POA: Diagnosis not present

## 2022-07-14 DIAGNOSIS — F5101 Primary insomnia: Secondary | ICD-10-CM

## 2022-07-14 DIAGNOSIS — E7849 Other hyperlipidemia: Secondary | ICD-10-CM

## 2022-07-14 MED ORDER — MONTELUKAST SODIUM 10 MG PO TABS: 10.0000 mg | ORAL_TABLET | Freq: Every day | ORAL | 1 refills | Status: AC

## 2022-07-14 MED ORDER — LOVASTATIN 40 MG PO TABS: ORAL_TABLET | ORAL | 1 refills | Status: DC

## 2022-07-14 MED ORDER — TRAZODONE HCL 50 MG PO TABS: ORAL_TABLET | ORAL | 1 refills | Status: AC

## 2022-07-14 MED ORDER — ALLOPURINOL 100 MG PO TABS: ORAL_TABLET | ORAL | 1 refills | Status: AC

## 2022-07-14 MED ORDER — LOSARTAN POTASSIUM 25 MG PO TABS: ORAL_TABLET | ORAL | 1 refills | Status: AC

## 2022-07-14 MED ORDER — OMEPRAZOLE 20 MG PO CPDR: DELAYED_RELEASE_CAPSULE | ORAL | 1 refills | Status: AC

## 2022-07-14 MED ORDER — ALBUTEROL SULFATE HFA 108 (90 BASE) MCG/ACT IN AERS: 2.0000 | INHALATION_SPRAY | Freq: Four times a day (QID) | RESPIRATORY_TRACT | 1 refills | Status: AC | PRN

## 2022-07-14 NOTE — Progress Notes (Signed)
Date:  07/14/2022   Name:  Kirsten Snyder   DOB:  1934-01-04   MRN:  [id number]   Chief Complaint: Asthma, Gout, Hypertension, Hyperlipidemia, Allergic Rhinitis , Gastroesophageal Reflux, and Insomnia  Asthma There is no cough, difficulty breathing, shortness of breath or wheezing. This is a chronic problem. The current episode started more than 1 year ago. The problem occurs intermittently. The problem has been waxing and waning. Pertinent negatives include no appetite change, chest pain, dyspnea on exertion, ear congestion, ear pain, fever, headaches, heartburn, malaise/fatigue, myalgias, nasal congestion, orthopnea, PND, postnasal drip, rhinorrhea, sneezing, sore throat, sweats, trouble swallowing or weight loss. Her symptoms are aggravated by nothing. Her symptoms are alleviated by beta-agonist. She reports moderate improvement on treatment. Her symptoms are not alleviated by steroid inhaler. Her past medical history is significant for asthma.  Hypertension This is a chronic problem. The current episode started more than 1 year ago. The problem has been gradually improving since onset. The problem is controlled. Pertinent negatives include no chest pain, headaches, malaise/fatigue, palpitations, PND, shortness of breath or sweats. Risk factors for coronary artery disease include dyslipidemia. Past treatments include angiotensin blockers. The current treatment provides moderate improvement. There are no compliance problems.  There is no history of angina, kidney disease, CAD/MI, CVA, heart failure, left ventricular hypertrophy, PVD or retinopathy. There is no history of chronic renal disease, a hypertension causing med or renovascular disease.  Hyperlipidemia This is a chronic problem. The current episode started more than 1 year ago. The problem is controlled. Recent lipid tests were reviewed and are normal. She has no history of chronic renal disease. Pertinent negatives include no chest pain,  myalgias or shortness of breath. The current treatment provides moderate improvement of lipids.  Gastroesophageal Reflux She reports no abdominal pain, no chest pain, no coughing, no heartburn, no nausea, no sore throat or no wheezing. This is a chronic problem. The current episode started more than 1 year ago. The symptoms are aggravated by certain foods. Pertinent negatives include no fatigue, orthopnea or weight loss. She has tried a PPI for the symptoms. The treatment provided moderate relief.  Insomnia Primary symptoms: no malaise/fatigue.   The current episode started more than one year. The problem has been gradually improving since onset.    Lab Results  Component Value Date   NA 143 01/13/2022   K 4.4 01/13/2022   CO2 26 01/13/2022   GLUCOSE 106 (H) 01/13/2022   BUN 12 01/13/2022   CREATININE 0.82 01/13/2022   CALCIUM 9.5 01/13/2022   EGFR 70 01/13/2022   GFRNONAA >60 03/01/2020   Lab Results  Component Value Date   CHOL 173 01/13/2022   HDL 48 01/13/2022   LDLCALC 105 (H) 01/13/2022   TRIG 112 01/13/2022   CHOLHDL 4.8 (H) 11/02/2017   Lab Results  Component Value Date   TSH 2.39 09/15/2012   Lab Results  Component Value Date   HGBA1C 5.6 01/13/2022   Lab Results  Component Value Date   WBC 6.4 02/24/2022   HGB 13.0 02/24/2022   HCT 38.7 02/24/2022   MCV 92.1 02/24/2022   PLT 259 02/24/2022   Lab Results  Component Value Date   ALT 10 01/13/2022   AST 14 01/13/2022   ALKPHOS 75 01/13/2022   BILITOT 0.9 01/13/2022   No results found for: "25OHVITD2", "25OHVITD3", "VD25OH"   Review of Systems  Constitutional: Negative.  Negative for appetite change, chills, fatigue, fever, malaise/fatigue, unexpected weight change and weight  loss.  HENT:  Negative for congestion, ear discharge, ear pain, postnasal drip, rhinorrhea, sinus pressure, sneezing, sore throat and trouble swallowing.   Respiratory:  Negative for cough, shortness of breath, wheezing and stridor.    Cardiovascular:  Negative for chest pain, dyspnea on exertion, palpitations and PND.  Gastrointestinal:  Negative for abdominal pain, blood in stool, constipation, diarrhea, heartburn and nausea.  Genitourinary:  Negative for dysuria, flank pain, frequency, hematuria, urgency and vaginal discharge.  Musculoskeletal:  Negative for arthralgias, back pain and myalgias.  Skin:  Negative for rash.  Neurological:  Negative for dizziness, weakness and headaches.  Hematological:  Negative for adenopathy. Does not bruise/bleed easily.  Psychiatric/Behavioral:  Negative for dysphoric mood. The patient has insomnia. The patient is not nervous/anxious.     Patient Active Problem List   Diagnosis Date Noted   Anxiety 03/04/2021   H/O total hysterectomy 08/05/2020   Fall at home, initial encounter 02/27/2020   S/P resection of meningioma 10/31/2019   Pulmonary emboli ([place]) 10/31/2019   Urinary tract infection, site not specified 10/03/2019   Sepsis secondary to UTI ([place]) 10/01/2019   Sepsis, unspecified organism ([place]) 10/01/2019   Hypokalemia 10/01/2019   Chronic obstructive pulmonary disease, unspecified ([place]) 09/22/2019   Difficulty in walking, not elsewhere classified 09/22/2019   Encounter for surgical aftercare following surgery on the nervous system 09/22/2019   Gout, unspecified 09/22/2019   Insomnia, unspecified 09/22/2019   Major depressive disorder, recurrent, unspecified ([place]) 09/22/2019   Muscle weakness (generalized) 09/22/2019   Need for assistance with personal care 09/22/2019   Other lack of coordination 09/22/2019   Other symptoms and signs involving cognitive functions and awareness 09/22/2019   Personal history of breast cancer 09/22/2019   Unsteadiness on feet 09/22/2019   Weakness 09/22/2019   Gastro-esophageal reflux disease without esophagitis 09/22/2019   Unspecified asthma, uncomplicated 0000000   Unspecified osteoarthritis, unspecified site 09/22/2019    Hyperlipidemia, unspecified 09/22/2019   Urinary urgency 09/15/2019   Atherosclerosis of abdominal aorta ([place]) 10/12/2018   SOBOE (shortness of breath on exertion) 10/12/2018   Osteopenia of neck of right femur 06/29/2018   Breast cancer, right ([place]) 06/17/2016   Essential (primary) hypertension 12/13/2015   Breast abscess of female 11/07/2015   Familial multiple lipoprotein-type hyperlipidemia 10/25/2014   Clinical depression 10/25/2014   Acid reflux 10/25/2014   Acute gastrointestinal bleeding 10/25/2014   Aggrieved 10/25/2014   Airway hyperreactivity 10/25/2014   Arthritis of knee 10/25/2014   H/O: gout 10/25/2014   Adiposity 10/25/2014   Arthritis, degenerative 10/25/2014   H/O adenomatous polyp of colon 03/07/2014    Allergies  Allergen Reactions   Sulfa Antibiotics     Past Surgical History:  Procedure Laterality Date   BREAST BIOPSY Bilateral    negative   BREAST EXCISIONAL BIOPSY Right    right positive 04/2010   BREAST LUMPECTOMY Right    removed a cancerous lump   VAGINAL HYSTERECTOMY      Social History   Tobacco Use   Smoking status: Former    Packs/day: 0.25    Years: 2.00    Additional pack years: 0.00    Total pack years: 0.50    Types: Cigarettes    Passive exposure: Past   Smokeless tobacco: Never   Tobacco comments:    smoking cessation materials not required  Vaping Use   Vaping Use: Never used  Substance Use Topics   Alcohol use: Not Currently   Drug use: Never     Medication list has  been reviewed and updated.  Current Meds  Medication Sig   albuterol (VENTOLIN HFA) 108 (90 Base) MCG/ACT inhaler Inhale 2 puffs into the lungs every 6 (six) hours as needed for wheezing or shortness of breath. Include aerochamber   allopurinol (ZYLOPRIM) 100 MG tablet TAKE (1) TABLET BY MOUTH EVERY DAY   calcium carbonate (TUMS - DOSED IN MG ELEMENTAL CALCIUM) 500 MG chewable tablet Chew 1 tablet by mouth every 8 (eight) hours as needed for indigestion.    cetirizine (ZYRTEC) 10 MG tablet Take 10 mg by mouth daily. For allergies.   estradiol (ESTRACE) 0.1 MG/GM vaginal cream Estrogen Cream Instruction Discard applicator Apply pea sized amount to tip of finger to urethra before bed. Wash hands well after application. Use Monday, Wednesday and Friday   ferrous sulfate 325 (65 FE) MG tablet Take 325 mg by mouth daily with breakfast.   Fluticasone-Umeclidin-Vilant (TRELEGY ELLIPTA) 100-62.5-25 MCG/ACT AEPB Inhale 1 puff into the lungs daily.   losartan (COZAAR) 25 MG tablet Take 1 by mouth daily   lovastatin (MEVACOR) 40 MG tablet TAKE 1 TABLET EVERY DAY AT BEDTIME   meclizine (ANTIVERT) 12.5 MG tablet Take 1 tablet (12.5 mg total) by mouth 3 (three) times daily as needed for dizziness.   montelukast (SINGULAIR) 10 MG tablet Take 1 tablet (10 mg total) by mouth at bedtime.   mupirocin ointment (BACTROBAN) 2 % Apply 1 Application topically 2 (two) times daily.   nitrofurantoin (MACRODANTIN) 50 MG capsule Take 1 capsule (50 mg total) by mouth daily.   nystatin cream (MYCOSTATIN) APPLY 1 APPLICATION TOPICALLY TWICE DAILY   omeprazole (PRILOSEC) 20 MG capsule One a day   polyethylene glycol (MIRALAX / GLYCOLAX) 17 g packet Take 17 g by mouth daily as needed.   predniSONE (DELTASONE) 10 MG tablet Take 1 tablet (10 mg total) by mouth daily with breakfast.   sertraline (ZOLOFT) 100 MG tablet Take 100 mg by mouth daily.   traZODone (DESYREL) 50 MG tablet TAKE 1 TABLET EVERY DAY AT BEDTIME   trospium (SANCTURA) 20 MG tablet Take 1 tablet (20 mg total) by mouth 2 (two) times daily.   Vibegron (GEMTESA) 75 MG TABS Take 1 tablet (75 mg total) by mouth daily.       07/14/2022   10:01 AM 04/22/2022   10:30 AM 02/24/2022    8:40 AM 02/06/2022   11:21 AM  GAD 7 : Generalized Anxiety Score  Nervous, Anxious, on Edge 0 0 0 0  Control/stop worrying 0 0 0 0  Worry too much - different things 0 0 0 0  Trouble relaxing 0 0 0 0  Restless 0 0 0 0  Easily annoyed or  irritable 0 0 0 0  Afraid - awful might happen 0 0 0 0  Total GAD 7 Score 0 0 0 0  Anxiety Difficulty Not difficult at all Not difficult at all Not difficult at all Not difficult at all       07/14/2022   10:01 AM 05/28/2022    9:33 AM 05/28/2022    9:32 AM  Depression screen PHQ 2/9  Decreased Interest 0  0  Down, Depressed, Hopeless 0 1 0  PHQ - 2 Score 0 1 0  Altered sleeping 0  0  Tired, decreased energy 0  0  Change in appetite 0  0  Feeling bad or failure about yourself  0  0  Trouble concentrating 0  0  Moving slowly or fidgety/restless 0  0  Suicidal thoughts  0  0  PHQ-9 Score 0  0  Difficult doing work/chores Not difficult at all Not difficult at all Not difficult at all    BP Readings from Last 3 Encounters:  07/14/22 100/60  07/13/22 125/77  06/25/22 122/78    Physical Exam Vitals and nursing note reviewed. Exam conducted with a chaperone present.  Constitutional:      General: She is not in acute distress.    Appearance: She is not diaphoretic.  HENT:     Head: Normocephalic and atraumatic.     Right Ear: Tympanic membrane and external ear normal.     Left Ear: Tympanic membrane and external ear normal.     Nose: Nose normal. No congestion or rhinorrhea.     Mouth/Throat:     Pharynx: No oropharyngeal exudate or posterior oropharyngeal erythema.  Eyes:     General:        Right eye: No discharge.        Left eye: No discharge.     Conjunctiva/sclera: Conjunctivae normal.     Pupils: Pupils are equal, round, and reactive to light.  Neck:     Thyroid: No thyromegaly.     Vascular: No JVD.  Cardiovascular:     Rate and Rhythm: Normal rate and regular rhythm.     Heart sounds: Normal heart sounds. No murmur heard.    No friction rub. No gallop.  Pulmonary:     Effort: Pulmonary effort is normal.     Breath sounds: Normal breath sounds. No wheezing or rhonchi.  Abdominal:     General: Bowel sounds are normal.     Palpations: Abdomen is soft. There is no  mass.     Tenderness: There is no abdominal tenderness. There is no guarding or rebound.  Musculoskeletal:        General: Normal range of motion.     Cervical back: Normal range of motion and neck supple.  Lymphadenopathy:     Cervical: No cervical adenopathy.  Skin:    General: Skin is warm and dry.  Neurological:     Mental Status: She is alert.     Motor: No weakness.     Deep Tendon Reflexes: Reflexes are normal and symmetric.     Wt Readings from Last 3 Encounters:  07/14/22 205 lb (93 kg)  07/13/22 204 lb (92.5 kg)  06/25/22 204 lb (92.5 kg)    BP 100/60   Pulse 90   Ht 5\' 3"  (1.6 m)   Wt 205 lb (93 kg)   SpO2 95%   BMI 36.31 kg/m   Assessment and Plan:  1. Mild intermittent asthma without complication Chronic.  Controlled.  Intermittent mild.  Continue with albuterol 2 puffs every 6 hours and Singulair 10 mg once a day.  Patient did well on prednisone the last time we will hold for breakthrough in the future. - albuterol (VENTOLIN HFA) 108 (90 Base) MCG/ACT inhaler; Inhale 2 puffs into the lungs every 6 (six) hours as needed for wheezing or shortness of breath. Include aerochamber  Dispense: 18 g; Refill: 1 - montelukast (SINGULAIR) 10 MG tablet; Take 1 tablet (10 mg total) by mouth at bedtime.  Dispense: 90 tablet; Refill: 1  2. Hyperuricemia Chronic.  Controlled.  Stable.  Continue current level of uric acid with allopurinol 100 mg daily. - allopurinol (ZYLOPRIM) 100 MG tablet; TAKE (1) TABLET BY MOUTH EVERY DAY  Dispense: 90 tablet; Refill: 1  3. Essential hypertension .  Controlled.  Stable.  Blood pressure 100/60.  Continue losartan 25 mg once a day.  Will check renal function panel for electrolytes and GFR. - losartan (COZAAR) 25 MG tablet; Take 1 by mouth daily  Dispense: 90 tablet; Refill: 1 - Renal Function Panel  4. Familial multiple lipoprotein-type hyperlipidemia Chronic.  Controlled.  Stable.  Continue lovastatin 40 mg once a day.  Will check lipid  panel for current level of control. - lovastatin (MEVACOR) 40 MG tablet; TAKE 1 TABLET EVERY DAY AT BEDTIME  Dispense: 90 tablet; Refill: 1 - Lipid Panel With LDL/HDL Ratio  5. Gastroesophageal reflux disease, unspecified whether esophagitis present .  Controlled.  Stable.  Continue omeprazole 20 mg once a day. - omeprazole (PRILOSEC) 20 MG capsule; One a day  Dispense: 90 capsule; Refill: 1  6. Primary insomnia Chronic.  Controlled.  Stable.  Doing well currently with trazodone nightly. - traZODone (DESYREL) 50 MG tablet; TAKE 1 TABLET EVERY DAY AT BEDTIME  Dispense: 90 tablet; Refill: 1    Otilio Miu, MD

## 2022-07-15 LAB — RENAL FUNCTION PANEL
Albumin: 3.9 g/dL (ref 3.7–4.7)
BUN/Creatinine Ratio: 17 (ref 12–28)
BUN: 16 mg/dL (ref 8–27)
CO2: 26 mmol/L (ref 20–29)
Calcium: 9.2 mg/dL (ref 8.7–10.3)
Chloride: 98 mmol/L (ref 96–106)
Creatinine, Ser: 0.93 mg/dL (ref 0.57–1.00)
Glucose: 96 mg/dL (ref 70–99)
Phosphorus: 3.5 mg/dL (ref 3.0–4.3)
Potassium: 3.9 mmol/L (ref 3.5–5.2)
Sodium: 140 mmol/L (ref 134–144)
eGFR: 59 mL/min/{1.73_m2} — ABNORMAL LOW (ref >59)

## 2022-07-15 LAB — LIPID PANEL WITH LDL/HDL RATIO
Cholesterol, Total: 245 mg/dL — ABNORMAL HIGH (ref 100–199)
HDL: 53 mg/dL (ref >39)
LDL Chol Calc (NIH): 162 mg/dL — ABNORMAL HIGH (ref 0–99)
LDL/HDL Ratio: 3.1 ratio (ref 0.0–3.2)
Triglycerides: 163 mg/dL — ABNORMAL HIGH (ref 0–149)
VLDL Cholesterol Cal: 30 mg/dL (ref 5–40)

## 2022-08-31 ENCOUNTER — Telehealth: Payer: Self-pay | Admitting: Urology

## 2022-08-31 ENCOUNTER — Other Ambulatory Visit: Payer: Self-pay | Admitting: Urology

## 2022-08-31 NOTE — Telephone Encounter (Signed)
Pt's son called in stating pt's insurance wouldn't cover Gemtesa.  They want to know if she should try something different.  Also, pt is almost out of Macrodantin.  They use [facility] in [place].

## 2022-09-01 MED ORDER — TROSPIUM CHLORIDE 20 MG PO TABS: 20.0000 mg | ORAL_TABLET | Freq: Two times a day (BID) | ORAL | 1 refills | Status: DC

## 2022-09-01 NOTE — Telephone Encounter (Signed)
Patient husband called back in. States that she was taking the trospium, ran out yesterday. States that he was told to call back in insurance didn't cover the gemtesa and something else would be prescribed.

## 2022-09-01 NOTE — Telephone Encounter (Signed)
Spoke to patient's son and he is just wanting to continue Trospium. Refill sent to pharmacy.

## 2022-09-09 ENCOUNTER — Ambulatory Visit (INDEPENDENT_AMBULATORY_CARE_PROVIDER_SITE_OTHER): Payer: Medicare HMO | Admitting: Family Medicine

## 2022-09-09 ENCOUNTER — Encounter: Payer: Self-pay | Admitting: Family Medicine

## 2022-09-09 ENCOUNTER — Other Ambulatory Visit
Admission: RE | Admit: 2022-09-09 | Discharge: 2022-09-09 | Disposition: A | Discharge status: Home / Self Care | Payer: Medicare HMO | Attending: Family Medicine | Admitting: Family Medicine

## 2022-09-09 VITALS — BP 118/70 | HR 90 | Temp 98.1°F | Ht 63.0 in | Wt 210.0 lb

## 2022-09-09 DIAGNOSIS — N390 Urinary tract infection, site not specified: Secondary | ICD-10-CM | POA: Diagnosis not present

## 2022-09-09 DIAGNOSIS — N3090 Cystitis, unspecified without hematuria: Secondary | ICD-10-CM

## 2022-09-09 DIAGNOSIS — E860 Dehydration: Secondary | ICD-10-CM | POA: Diagnosis not present

## 2022-09-09 LAB — POCT URINALYSIS DIPSTICK
Bilirubin, UA: NEGATIVE
Blood, UA: NEGATIVE
Glucose, UA: NEGATIVE
Ketones, UA: NEGATIVE
Nitrite, UA: NEGATIVE
Protein, UA: NEGATIVE
Spec Grav, UA: 1.02 (ref 1.010–1.025)
Urobilinogen, UA: 0.2 E.U./dL
pH, UA: 7.5 (ref 5.0–8.0)

## 2022-09-09 LAB — RENAL FUNCTION PANEL
Albumin: 3.6 g/dL (ref 3.5–5.0)
Anion gap: 7 (ref 5–15)
BUN: 7 mg/dL — ABNORMAL LOW (ref 8–23)
CO2: 27 mmol/L (ref 22–32)
Calcium: 8.6 mg/dL — ABNORMAL LOW (ref 8.9–10.3)
Chloride: 100 mmol/L (ref 98–111)
Creatinine, Ser: 0.86 mg/dL (ref 0.44–1.00)
GFR, Estimated: >60 mL/min (ref >60)
Glucose, Bld: 125 mg/dL — ABNORMAL HIGH (ref 70–99)
Phosphorus: 3.9 mg/dL (ref 2.5–4.6)
Potassium: 4.1 mmol/L (ref 3.5–5.1)
Sodium: 134 mmol/L — ABNORMAL LOW (ref 135–145)

## 2022-09-09 MED ORDER — NITROFURANTOIN MACROCRYSTAL 50 MG PO CAPS: 50.0000 mg | ORAL_CAPSULE | Freq: Two times a day (BID) | ORAL | 0 refills | Status: DC

## 2022-09-09 NOTE — Progress Notes (Signed)
Date:  09/09/2022   Name:  Kirsten Snyder   DOB:  1933-07-24   MRN:  [id number]   Chief Complaint: Diarrhea (Had diarrhea over the weekend- has had a couple this morning, but "not as bad"- not as runny)  .Patient is a 89 year old female who presents for a comprehensive physical exam. The patient reports the following problems: malaise. Health maintenance has been reviewed up to date.      Lab Results  Component Value Date   NA 140 07/14/2022   K 3.9 07/14/2022   CO2 26 07/14/2022   GLUCOSE 96 07/14/2022   BUN 16 07/14/2022   CREATININE 0.93 07/14/2022   CALCIUM 9.2 07/14/2022   EGFR 59 (L) 07/14/2022   GFRNONAA >60 03/01/2020   Lab Results  Component Value Date   CHOL 245 (H) 07/14/2022   HDL 53 07/14/2022   LDLCALC 162 (H) 07/14/2022   TRIG 163 (H) 07/14/2022   CHOLHDL 4.8 (H) 11/02/2017   Lab Results  Component Value Date   TSH 2.39 09/15/2012   Lab Results  Component Value Date   HGBA1C 5.6 01/13/2022   Lab Results  Component Value Date   WBC 6.4 02/24/2022   HGB 13.0 02/24/2022   HCT 38.7 02/24/2022   MCV 92.1 02/24/2022   PLT 259 02/24/2022   Lab Results  Component Value Date   ALT 10 01/13/2022   AST 14 01/13/2022   ALKPHOS 75 01/13/2022   BILITOT 0.9 01/13/2022   No results found for: "25OHVITD2", "25OHVITD3", "VD25OH"   Review of Systems  Constitutional:  Positive for fatigue. Negative for appetite change, diaphoresis and fever.  HENT:  Negative for congestion, ear pain, nosebleeds, postnasal drip, rhinorrhea, sinus pressure, sinus pain, sneezing and sore throat.   Respiratory:  Negative for chest tightness, shortness of breath and wheezing.   Cardiovascular:  Negative for chest pain.  Gastrointestinal:  Positive for diarrhea. Negative for abdominal pain, anal bleeding, blood in stool and constipation.  Endocrine: Negative for polydipsia and polyuria.  Genitourinary:  Negative for decreased urine volume, difficulty urinating and frequency.     Patient Active Problem List   Diagnosis Date Noted   Anxiety 03/04/2021   H/O total hysterectomy 08/05/2020   Fall at home, initial encounter 02/27/2020   S/P resection of meningioma 10/31/2019   Pulmonary emboli ([facility]) 10/31/2019   Urinary tract infection, site not specified 10/03/2019   Sepsis secondary to UTI ([facility]) 10/01/2019   Sepsis, unspecified organism ([facility]) 10/01/2019   Hypokalemia 10/01/2019   Chronic obstructive pulmonary disease, unspecified ([facility]) 09/22/2019   Difficulty in walking, not elsewhere classified 09/22/2019   Encounter for surgical aftercare following surgery on the nervous system 09/22/2019   Gout, unspecified 09/22/2019   Insomnia, unspecified 09/22/2019   Major depressive disorder, recurrent, unspecified ([facility]) 09/22/2019   Muscle weakness (generalized) 09/22/2019   Need for assistance with personal care 09/22/2019   Other lack of coordination 09/22/2019   Other symptoms and signs involving cognitive functions and awareness 09/22/2019   Personal history of breast cancer 09/22/2019   Unsteadiness on feet 09/22/2019   Weakness 09/22/2019   Gastro-esophageal reflux disease without esophagitis 09/22/2019   Unspecified asthma, uncomplicated 09/22/2019   Unspecified osteoarthritis, unspecified site 09/22/2019   Hyperlipidemia, unspecified 09/22/2019   Urinary urgency 09/15/2019   Atherosclerosis of abdominal aorta ([facility]) 10/12/2018   SOBOE (shortness of breath on exertion) 10/12/2018   Osteopenia of neck of right femur 06/29/2018   Breast cancer, right ([facility]) 06/17/2016  Essential (primary) hypertension 12/13/2015   Breast abscess of female 11/07/2015   Familial multiple lipoprotein-type hyperlipidemia 10/25/2014   Clinical depression 10/25/2014   Acid reflux 10/25/2014   Acute gastrointestinal bleeding 10/25/2014   Aggrieved 10/25/2014   Airway hyperreactivity 10/25/2014   Arthritis of knee 10/25/2014   H/O: gout 10/25/2014   Adiposity 10/25/2014    Arthritis, degenerative 10/25/2014   H/O adenomatous polyp of colon 03/07/2014    Allergies  Allergen Reactions   Sulfa Antibiotics     Past Surgical History:  Procedure Laterality Date   BREAST BIOPSY Bilateral    negative   BREAST EXCISIONAL BIOPSY Right    right positive 04/2010   BREAST LUMPECTOMY Right    removed a cancerous lump   VAGINAL HYSTERECTOMY      Social History   Tobacco Use   Smoking status: Former    Packs/day: 0.25    Years: 2.00    Additional pack years: 0.00    Total pack years: 0.50    Types: Cigarettes    Passive exposure: Past   Smokeless tobacco: Never   Tobacco comments:    smoking cessation materials not required  Vaping Use   Vaping Use: Never used  Substance Use Topics   Alcohol use: Not Currently   Drug use: Never     Medication list has been reviewed and updated.  Current Meds  Medication Sig   albuterol (VENTOLIN HFA) 108 (90 Base) MCG/ACT inhaler Inhale 2 puffs into the lungs every 6 (six) hours as needed for wheezing or shortness of breath. Include aerochamber   allopurinol (ZYLOPRIM) 100 MG tablet TAKE (1) TABLET BY MOUTH EVERY DAY   calcium carbonate (TUMS - DOSED IN MG ELEMENTAL CALCIUM) 500 MG chewable tablet Chew 1 tablet by mouth every 8 (eight) hours as needed for indigestion.   cetirizine (ZYRTEC) 10 MG tablet Take 10 mg by mouth daily. For allergies.   estradiol (ESTRACE) 0.1 MG/GM vaginal cream Estrogen Cream Instruction Discard applicator Apply pea sized amount to tip of finger to urethra before bed. Wash hands well after application. Use Monday, Wednesday and Friday   ferrous sulfate 325 (65 FE) MG tablet Take 325 mg by mouth daily with breakfast.   Fluticasone-Umeclidin-Vilant (TRELEGY ELLIPTA) 100-62.5-25 MCG/ACT AEPB Inhale 1 puff into the lungs daily.   losartan (COZAAR) 25 MG tablet Take 1 by mouth daily   lovastatin (MEVACOR) 40 MG tablet TAKE 1 TABLET EVERY DAY AT BEDTIME   meclizine (ANTIVERT) 12.5 MG tablet  Take 1 tablet (12.5 mg total) by mouth 3 (three) times daily as needed for dizziness.   montelukast (SINGULAIR) 10 MG tablet Take 1 tablet (10 mg total) by mouth at bedtime.   mupirocin ointment (BACTROBAN) 2 % Apply 1 Application topically 2 (two) times daily.   nystatin cream (MYCOSTATIN) APPLY 1 APPLICATION TOPICALLY TWICE DAILY   omeprazole (PRILOSEC) 20 MG capsule One a day   predniSONE (DELTASONE) 10 MG tablet Take 1 tablet (10 mg total) by mouth daily with breakfast.   sertraline (ZOLOFT) 100 MG tablet Take 100 mg by mouth daily.   traZODone (DESYREL) 50 MG tablet TAKE 1 TABLET EVERY DAY AT BEDTIME   trospium (SANCTURA) 20 MG tablet Take 1 tablet (20 mg total) by mouth 2 (two) times daily.       09/09/2022   11:36 AM 07/14/2022   10:01 AM 04/22/2022   10:30 AM 02/24/2022    8:40 AM  GAD 7 : Generalized Anxiety Score  Nervous, Anxious, on Edge  0 0 0 0  Control/stop worrying 0 0 0 0  Worry too much - different things 0 0 0 0  Trouble relaxing 0 0 0 0  Restless 0 0 0 0  Easily annoyed or irritable 0 0 0 0  Afraid - awful might happen 0 0 0 0  Total GAD 7 Score 0 0 0 0  Anxiety Difficulty Not difficult at all Not difficult at all Not difficult at all Not difficult at all       09/09/2022   11:35 AM 07/14/2022   10:01 AM 05/28/2022    9:33 AM  Depression screen PHQ 2/9  Decreased Interest 0 0   Down, Depressed, Hopeless 0 0 1  PHQ - 2 Score 0 0 1  Altered sleeping 0 0   Tired, decreased energy 2 0   Change in appetite 0 0   Feeling bad or failure about yourself  0 0   Trouble concentrating 0 0   Moving slowly or fidgety/restless 0 0   Suicidal thoughts 0 0   PHQ-9 Score 2 0   Difficult doing work/chores Not difficult at all Not difficult at all Not difficult at all    BP Readings from Last 3 Encounters:  09/09/22 118/70  07/14/22 100/60  07/13/22 125/77    Physical Exam Vitals and nursing note reviewed.  HENT:     Right Ear: Tympanic membrane normal.     Left Ear:  Tympanic membrane normal.     Nose: Nose normal.     Mouth/Throat:     Mouth: Mucous membranes are moist.  Eyes:     Pupils: Pupils are equal, round, and reactive to light.  Cardiovascular:     Rate and Rhythm: Normal rate.     Heart sounds: No murmur heard.    No gallop.  Pulmonary:     Breath sounds: No rhonchi or rales.  Abdominal:     General: There is no distension.     Tenderness: There is no abdominal tenderness.  Musculoskeletal:     Cervical back: Neck supple.  Skin:    General: Skin is warm.  Neurological:     Mental Status: She is alert.     Wt Readings from Last 3 Encounters:  09/09/22 210 lb (95.3 kg)  07/14/22 205 lb (93 kg)  07/13/22 204 lb (92.5 kg)    BP 118/70   Pulse 90   Temp 98.1 F (36.7 C) (Oral)   Ht 5\' 3"  (1.6 m)   Wt 210 lb (95.3 kg)   SpO2 96%   BMI 37.20 kg/m   Assessment and Plan:  1. Dehydration New onset persistent patient has been drinking mostly sodas with minimal water intake.  We have reemphasized this and also link will include some Ensure for her to take on a more regular basis than the [place].  2. Cystitis Patient is currently not on suppression and urinalysis notes that patient has leukocytes consistent with may be an early cystitis.  We will treat with Acra bid twice a day for 5 days.  Patient is encouraged to drink more fluids.  We will recheck patient on an as-needed basis. - nitrofurantoin (MACRODANTIN) 50 MG capsule; Take 1 capsule (50 mg total) by mouth 2 (two) times daily.  Dispense: 10 capsule; Refill: 0 - POCT urinalysis dipstick    Elizabeth Sauer, MD

## 2022-09-15 ENCOUNTER — Telehealth: Payer: Self-pay | Admitting: Urology

## 2022-09-15 ENCOUNTER — Other Ambulatory Visit: Payer: Self-pay | Admitting: Family Medicine

## 2022-09-15 MED ORDER — TROSPIUM CHLORIDE 20 MG PO TABS: 20.0000 mg | ORAL_TABLET | Freq: Two times a day (BID) | ORAL | 3 refills | Status: DC

## 2022-09-15 NOTE — Telephone Encounter (Signed)
Pt last seen by you in March, looks like most recently trospium was sent into the pharmacy, son walked into [facility] today requesting alternative.

## 2022-09-15 NOTE — Telephone Encounter (Signed)
Pt. Kirsten Snyder son [name] came in and said that Insurance will not cover Gemtesa for his mom and he need Dr. Richardo Hanks to send a substitute for her to [facility] in [place]. Dwight's phone # [phone]

## 2022-09-15 NOTE — Telephone Encounter (Signed)
Spoke to [employer] for Trospium was sent to [facility] so they could use the [employer] coupon.

## 2022-09-15 NOTE — Telephone Encounter (Signed)
Spoke to [employer] was changed to [facility] so they could use the [place] coupon.

## 2022-09-21 DIAGNOSIS — D485 Neoplasm of uncertain behavior of skin: Secondary | ICD-10-CM | POA: Diagnosis not present

## 2022-09-21 DIAGNOSIS — C44519 Basal cell carcinoma of skin of other part of trunk: Secondary | ICD-10-CM | POA: Diagnosis not present

## 2022-10-19 ENCOUNTER — Ambulatory Visit: Payer: Medicare HMO | Admitting: Urology

## 2022-10-19 DIAGNOSIS — C44519 Basal cell carcinoma of skin of other part of trunk: Secondary | ICD-10-CM | POA: Diagnosis not present

## 2022-10-19 DIAGNOSIS — C4491 Basal cell carcinoma of skin, unspecified: Secondary | ICD-10-CM | POA: Diagnosis not present

## 2022-10-27 DIAGNOSIS — I509 Heart failure, unspecified: Secondary | ICD-10-CM | POA: Diagnosis not present

## 2022-11-08 NOTE — Progress Notes (Unsigned)
11/09/2022 5:42 PM   Kirsten Snyder [birth year] [id number]  Referring provider: Duanne Limerick, MD [address] [place],  [place] [address]  Urological history: 1. rUTI's -Contributing factors of age, vaginal atrophy, incontinence and constipation -documented urine cultures over the last year  04/22/2022 - E.coli   02/06/2022 - E.coli -vaginal estrogen cream, cranberry tablets and nitrofurantoin 100 mg daily  2. Urge incontinence -contributing factors of age, vaginal atrophy, history of smoking, hypertension, COPD, arthritis, depression and anxiety -insurance would not cover Gemtesa 75 mg daily     HPI: Kirsten Snyder is a 89 y.o. female who presents today for 3 month follow up for urge incontinence with her son, Kirsten Snyder.   At her visit on 07/13/2022, she was having 8 or more daytime urinations, 3 or more nighttime urinations with a mild urge to urinate.  She was wearing depends daily.  She was engaging in toilet mapping.  She was very pleased with the Gemtesa samples as it let her have more time to get to the restroom.  She was able to sleep in her bed which she has not been able to do in months.  Patient denied any modifying or aggravating factors.  Patient denied any gross hematuria, dysuria or suprapubic/flank pain.  Patient denied any fevers, chills, nausea or vomiting.   PVR 0 mL.  She has not yet started the vaginal estrogen cream.  She was encouraged to start her vaginal estrogen cream and to continue the [place].   Her insurance would not cover the [facility], so she was prescribed trospium IR 20 mg BID.    PVR ***   PMH: Past Medical History:  Diagnosis Date   Allergy    Asthma    Breast cancer (HCC)    Cancer (HCC) 2012   right breast cancer lumpectomy with rad tx   Depression    Edema    GERD (gastroesophageal reflux disease)    Gout    Hyperlipidemia    Hypertension    Personal history of radiation therapy     Surgical History: Past Surgical  History:  Procedure Laterality Date   BREAST BIOPSY Bilateral    negative   BREAST EXCISIONAL BIOPSY Right    right positive 04/2010   BREAST LUMPECTOMY Right    removed a cancerous lump   VAGINAL HYSTERECTOMY      Home Medications:  Allergies as of 11/09/2022       Reactions   Sulfa Antibiotics         Medication List        Accurate as of November 08, 2022  5:42 PM. If you have any questions, ask your nurse or doctor.          albuterol 108 (90 Base) MCG/ACT inhaler Commonly known as: VENTOLIN HFA Inhale 2 puffs into the lungs every 6 (six) hours as needed for wheezing or shortness of breath. Include aerochamber   allopurinol 100 MG tablet Commonly known as: ZYLOPRIM TAKE (1) TABLET BY MOUTH EVERY DAY   calcium carbonate 500 MG chewable tablet Commonly known as: TUMS - dosed in mg elemental calcium Chew 1 tablet by mouth every 8 (eight) hours as needed for indigestion.   cetirizine 10 MG tablet Commonly known as: ZYRTEC Take 10 mg by mouth daily. For allergies.   estradiol 0.1 MG/GM vaginal cream Commonly known as: ESTRACE Estrogen Cream Instruction Discard applicator Apply pea sized amount to tip of finger to urethra before bed. Wash hands well  after application. Use Monday, Wednesday and Friday   ferrous sulfate 325 (65 FE) MG tablet Take 325 mg by mouth daily with breakfast.   losartan 25 MG tablet Commonly known as: COZAAR Take 1 by mouth daily   lovastatin 40 MG tablet Commonly known as: MEVACOR TAKE 1 TABLET EVERY DAY AT BEDTIME   meclizine 12.5 MG tablet Commonly known as: ANTIVERT Take 1 tablet (12.5 mg total) by mouth 3 (three) times daily as needed for dizziness.   montelukast 10 MG tablet Commonly known as: SINGULAIR Take 1 tablet (10 mg total) by mouth at bedtime.   mupirocin ointment 2 % Commonly known as: BACTROBAN Apply 1 Application topically 2 (two) times daily.   nitrofurantoin 50 MG capsule Commonly known as: MACRODANTIN Take 1  capsule (50 mg total) by mouth 2 (two) times daily.   nystatin cream Commonly known as: MYCOSTATIN APPLY 1 APPLICATION TOPICALLY TWICE DAILY   omeprazole 20 MG capsule Commonly known as: PRILOSEC One a day   polyethylene glycol 17 g packet Commonly known as: MIRALAX / GLYCOLAX Take 17 g by mouth daily as needed.   predniSONE 10 MG tablet Commonly known as: DELTASONE Take 1 tablet (10 mg total) by mouth daily with breakfast.   sertraline 100 MG tablet Commonly known as: ZOLOFT Take 100 mg by mouth daily.   traZODone 50 MG tablet Commonly known as: DESYREL TAKE 1 TABLET EVERY DAY AT BEDTIME   Trelegy Ellipta 100-62.5-25 MCG/ACT Aepb Generic drug: Fluticasone-Umeclidin-Vilant Inhale 1 puff into the lungs daily.   trospium 20 MG tablet Commonly known as: SANCTURA Take 1 tablet (20 mg total) by mouth 2 (two) times daily.        Allergies:  Allergies  Allergen Reactions   Sulfa Antibiotics     Family History: Family History  Problem Relation Age of Onset   Leukemia Mother    Heart disease Father    Breast cancer Neg Hx     Social History:  reports that she has quit smoking. Her smoking use included cigarettes. She has a 0.5 pack-year smoking history. She has been exposed to tobacco smoke. She has never used smokeless tobacco. She reports that she does not currently use alcohol. She reports that she does not use drugs.  ROS: Pertinent ROS in HPI  Physical Exam: There were no vitals taken for this visit.  Constitutional:  Well nourished. Alert and oriented, No acute distress. HEENT: [place] AT, moist mucus membranes.  Trachea midline Cardiovascular: No clubbing, cyanosis, or edema. Respiratory: Normal respiratory effort, no increased work of breathing. Neurologic: Grossly intact, no focal deficits, moving all 4 extremities. Psychiatric: Normal mood and affect.    Laboratory Data:  Serum creatinine (02/2022) 0.65 Lab Results  Component Value Date   HGBA1C 5.6  01/13/2022      Component Value Date/Time   CHOL 245 (H) 07/14/2022 1030   HDL 53 07/14/2022 1030   CHOLHDL 4.8 (H) 11/02/2017 1202   LDLCALC 162 (H) 07/14/2022 1030   Lab Results  Component Value Date   AST 14 01/13/2022   Lab Results  Component Value Date   ALT 10 01/13/2022  I have reviewed the labs.   Pertinent Imaging:  07/13/22 10:05  Scan Result 0     Assessment & Plan:    1. Urge incontinence -She is very pleased with the results of the Gemtesa 75 mg daily -She will continue Gemtesa 75 mg daily, I gave her more samples as I am concerned that her insurance coverage  for this medication will be cost prohibitive -She will contact the office if that is the case and we will prescribe a different OAB medication -Otherwise she will follow-up in 3 months  2.  Vaginal atrophy -Encouraged her to start the vaginal estrogen cream applying it Monday, Wednesday and Friday nights  No follow-ups on file.  These notes generated with voice recognition software. I apologize for typographical errors.  Cloretta Ned  [facility] [address]  [address] [place], [place] [address] [phone]

## 2022-11-09 ENCOUNTER — Encounter: Payer: Self-pay | Admitting: Urology

## 2022-11-09 ENCOUNTER — Ambulatory Visit (INDEPENDENT_AMBULATORY_CARE_PROVIDER_SITE_OTHER): Payer: Medicare HMO | Admitting: Urology

## 2022-11-09 ENCOUNTER — Ambulatory Visit: Payer: Medicare HMO | Admitting: Urology

## 2022-11-09 VITALS — BP 139/81 | HR 96 | Ht 63.0 in | Wt 210.0 lb

## 2022-11-09 DIAGNOSIS — N3941 Urge incontinence: Secondary | ICD-10-CM | POA: Diagnosis not present

## 2022-11-09 DIAGNOSIS — N952 Postmenopausal atrophic vaginitis: Secondary | ICD-10-CM

## 2022-11-09 LAB — BLADDER SCAN AMB NON-IMAGING

## 2023-01-01 ENCOUNTER — Ambulatory Visit: Payer: Self-pay | Admitting: *Deleted

## 2023-01-01 NOTE — Telephone Encounter (Signed)
Chief Complaint: constipation per patient son Symptoms: BMs small hard balls last known yesterday. Rectal pain reported. Taking miralax and stool softeners. Son reports patient not drinking enough water Frequency: na  Pertinent Negatives: Patient denies blood in stool, no report of abdominal pain no vomiting. No nausea reported  Disposition: [] ED /[] Urgent Care (no appt availability in office) / [] Appointment(In office/virtual)/ []  [facility]/ [] Home Care/ [] Refused Recommended Disposition /[] [facility]/ [x]  Follow-up with PCP Additional Notes:   Recommended increase fluid intake and drink warm liquids such as prune juice , apple juice. Ambulate within in home .Marland Kitchen May try MOM if not directed to do so in the past. Please advise .   Summary: bowel movement concerns   The patient's son has called to share that they have recently experienced constipation concerns and difficulty using the restroom  The patient is concerned that his mother may not be drinking enough water and would like to speak with clinical staff when possible  Please contact the patient's son further when possible             Reason for Disposition  Constipation is a chronic symptom (recurrent or ongoing AND present > 4 weeks)  Answer Assessment - Initial Assessment Questions 1. STOOL PATTERN OR FREQUENCY: "How often do you have a bowel movement (BM)?"  (Normal range: 3 times a day to every 3 days)  "When was your last BM?"       Unsure ,small  hard balls since yesterday per patient son on DPR 2. STRAINING: "Do you have to strain to have a BM?"      Unsure  3. RECTAL PAIN: "Does your rectum hurt when the stool comes out?" If Yes, ask: "Do you have hemorrhoids? How bad is the pain?"  (Scale 1-10; or mild, moderate, severe)     Yes  4. STOOL COMPOSITION: "Are the stools hard?"      Yes  5. BLOOD ON STOOLS: "Has there been any blood on the toilet tissue or on the surface of the BM?" If Yes, ask:  "When was the last time?"     unknown 6. CHRONIC CONSTIPATION: "Is this a new problem for you?"  If No, ask: "How long have you had this problem?" (days, weeks, months)      Few days  7. CHANGES IN DIET OR HYDRATION: "Have there been any recent changes in your diet?" "How much fluids are you drinking on a daily basis?"  "How much have you had to drink today?"     Son reports not drinking enough fluids  8. MEDICINES: "Have you been taking any new medicines?" "Are you taking any narcotic pain medicines?" (e.g., Dilaudid, morphine, Percocet, Vicodin)     No  9. LAXATIVES: "Have you been using any stool softeners, laxatives, or enemas?"  If Yes, ask "What, how often, and when was the last time?"     Stool softeners, miralax. 10. ACTIVITY:  "How much walking do you do every day?"  "Has your activity level decreased in the past week?"        No much  11. CAUSE: "What do you think is causing the constipation?"        Not  12. OTHER SYMPTOMS: "Do you have any other symptoms?" (e.g., abdomen pain, bloating, fever, vomiting)       Rectal pain  13. MEDICAL HISTORY: "Do you have a history of hemorrhoids, rectal fissures, or rectal surgery or rectal abscess?"  Na  14. PREGNANCY: "Is there any chance you are pregnant?" "When was your last menstrual period?"       na  Protocols used: Constipation-A-AH

## 2023-01-04 ENCOUNTER — Encounter: Payer: Self-pay | Admitting: Family Medicine

## 2023-01-04 ENCOUNTER — Ambulatory Visit (INDEPENDENT_AMBULATORY_CARE_PROVIDER_SITE_OTHER): Payer: Medicare HMO | Admitting: Family Medicine

## 2023-01-04 VITALS — BP 138/84 | HR 110 | Ht 63.0 in | Wt 198.0 lb

## 2023-01-04 DIAGNOSIS — F4323 Adjustment disorder with mixed anxiety and depressed mood: Secondary | ICD-10-CM

## 2023-01-04 MED ORDER — SERTRALINE HCL 25 MG PO TABS: 25.0000 mg | ORAL_TABLET | Freq: Every day | ORAL | 3 refills | Status: DC

## 2023-01-04 NOTE — Progress Notes (Signed)
Date:  01/04/2023   Name:  Kirsten Snyder   DOB:  1934/01/27   MRN:  [id number]   Chief Complaint: Depression (18 and 7- trazodone is not working for sleep. )  Depression        This is a recurrent problem.  The current episode started more than 1 month ago.   The onset quality is gradual.   The problem has been gradually worsening since onset.  Associated symptoms include fatigue, helplessness, hopelessness, restlessness, decreased interest, sad and suicidal ideas.     The symptoms are aggravated by family issues (family moving out).  Treatments tried: use to take sertraline.  Compliance with treatment is good.   Lab Results  Component Value Date   NA 134 (L) 09/09/2022   K 4.1 09/09/2022   CO2 27 09/09/2022   GLUCOSE 125 (H) 09/09/2022   BUN 7 (L) 09/09/2022   CREATININE 0.86 09/09/2022   CALCIUM 8.6 (L) 09/09/2022   EGFR 59 (L) 07/14/2022   GFRNONAA >60 09/09/2022   Lab Results  Component Value Date   CHOL 245 (H) 07/14/2022   HDL 53 07/14/2022   LDLCALC 162 (H) 07/14/2022   TRIG 163 (H) 07/14/2022   CHOLHDL 4.8 (H) 11/02/2017   Lab Results  Component Value Date   TSH 2.39 09/15/2012   Lab Results  Component Value Date   HGBA1C 5.6 01/13/2022   Lab Results  Component Value Date   WBC 6.4 02/24/2022   HGB 13.0 02/24/2022   HCT 38.7 02/24/2022   MCV 92.1 02/24/2022   PLT 259 02/24/2022   Lab Results  Component Value Date   ALT 10 01/13/2022   AST 14 01/13/2022   ALKPHOS 75 01/13/2022   BILITOT 0.9 01/13/2022   No results found for: "25OHVITD2", "25OHVITD3", "VD25OH"   Review of Systems  Constitutional:  Positive for fatigue.  Eyes:  Negative for visual disturbance.  Respiratory:  Negative for cough, shortness of breath and wheezing.        "Weakness "in chest  Cardiovascular:  Negative for chest pain, palpitations and leg swelling.  Psychiatric/Behavioral:  Positive for depression and suicidal ideas.     Patient Active Problem List   Diagnosis  Date Noted   Anxiety 03/04/2021   H/O total hysterectomy 08/05/2020   Fall at home, initial encounter 02/27/2020   S/P resection of meningioma 10/31/2019   Pulmonary emboli (HCC) 10/31/2019   Urinary tract infection, site not specified 10/03/2019   Sepsis secondary to UTI (HCC) 10/01/2019   Sepsis, unspecified organism (HCC) 10/01/2019   Hypokalemia 10/01/2019   Chronic obstructive pulmonary disease, unspecified (HCC) 09/22/2019   Difficulty in walking, not elsewhere classified 09/22/2019   Encounter for surgical aftercare following surgery on the nervous system 09/22/2019   Gout, unspecified 09/22/2019   Insomnia, unspecified 09/22/2019   Major depressive disorder, recurrent, unspecified (HCC) 09/22/2019   Muscle weakness (generalized) 09/22/2019   Need for assistance with personal care 09/22/2019   Other lack of coordination 09/22/2019   Other symptoms and signs involving cognitive functions and awareness 09/22/2019   Personal history of breast cancer 09/22/2019   Unsteadiness on feet 09/22/2019   Weakness 09/22/2019   Gastro-esophageal reflux disease without esophagitis 09/22/2019   Unspecified asthma, uncomplicated 09/22/2019   Unspecified osteoarthritis, unspecified site 09/22/2019   Hyperlipidemia, unspecified 09/22/2019   Urinary urgency 09/15/2019   Atherosclerosis of abdominal aorta (HCC) 10/12/2018   SOBOE (shortness of breath on exertion) 10/12/2018   Osteopenia of neck of right femur  06/29/2018   Breast cancer, right (HCC) 06/17/2016   Essential (primary) hypertension 12/13/2015   Breast abscess of female 11/07/2015   Familial multiple lipoprotein-type hyperlipidemia 10/25/2014   Clinical depression 10/25/2014   Acid reflux 10/25/2014   Acute gastrointestinal bleeding 10/25/2014   Aggrieved 10/25/2014   Airway hyperreactivity 10/25/2014   Arthritis of knee 10/25/2014   H/O: gout 10/25/2014   Adiposity 10/25/2014   Arthritis, degenerative 10/25/2014   H/O  adenomatous polyp of colon 03/07/2014    Allergies  Allergen Reactions   Sulfa Antibiotics     Past Surgical History:  Procedure Laterality Date   BREAST BIOPSY Bilateral    negative   BREAST EXCISIONAL BIOPSY Right    right positive 04/2010   BREAST LUMPECTOMY Right    removed a cancerous lump   VAGINAL HYSTERECTOMY      Social History   Tobacco Use   Smoking status: Former    Current packs/day: 0.25    Average packs/day: 0.3 packs/day for 2.0 years (0.5 ttl pk-yrs)    Types: Cigarettes    Passive exposure: Past   Smokeless tobacco: Never   Tobacco comments:    smoking cessation materials not required  Vaping Use   Vaping status: Never Used  Substance Use Topics   Alcohol use: Not Currently   Drug use: Never     Medication list has been reviewed and updated.  Current Meds  Medication Sig   albuterol (VENTOLIN HFA) 108 (90 Base) MCG/ACT inhaler Inhale 2 puffs into the lungs every 6 (six) hours as needed for wheezing or shortness of breath. Include aerochamber   allopurinol (ZYLOPRIM) 100 MG tablet TAKE (1) TABLET BY MOUTH EVERY DAY   calcium carbonate (TUMS - DOSED IN MG ELEMENTAL CALCIUM) 500 MG chewable tablet Chew 1 tablet by mouth every 8 (eight) hours as needed for indigestion.   cetirizine (ZYRTEC) 10 MG tablet Take 10 mg by mouth daily. For allergies.   estradiol (ESTRACE) 0.1 MG/GM vaginal cream Estrogen Cream Instruction Discard applicator Apply pea sized amount to tip of finger to urethra before bed. Wash hands well after application. Use Monday, Wednesday and Friday   ferrous sulfate 325 (65 FE) MG tablet Take 325 mg by mouth daily with breakfast.   Fluticasone-Umeclidin-Vilant (TRELEGY ELLIPTA) 100-62.5-25 MCG/ACT AEPB Inhale 1 puff into the lungs daily.   losartan (COZAAR) 25 MG tablet Take 1 by mouth daily   lovastatin (MEVACOR) 40 MG tablet TAKE 1 TABLET EVERY DAY AT BEDTIME   meclizine (ANTIVERT) 12.5 MG tablet Take 1 tablet (12.5 mg total) by mouth 3  (three) times daily as needed for dizziness.   montelukast (SINGULAIR) 10 MG tablet Take 1 tablet (10 mg total) by mouth at bedtime.   mupirocin ointment (BACTROBAN) 2 % Apply 1 Application topically 2 (two) times daily.   nystatin cream (MYCOSTATIN) APPLY 1 APPLICATION TOPICALLY TWICE DAILY   omeprazole (PRILOSEC) 20 MG capsule One a day   polyethylene glycol (MIRALAX / GLYCOLAX) 17 g packet Take 17 g by mouth daily as needed.   traZODone (DESYREL) 50 MG tablet TAKE 1 TABLET EVERY DAY AT BEDTIME   trospium (SANCTURA) 20 MG tablet Take 1 tablet (20 mg total) by mouth 2 (two) times daily.       01/04/2023    4:05 PM 09/09/2022   11:36 AM 07/14/2022   10:01 AM 04/22/2022   10:30 AM  GAD 7 : Generalized Anxiety Score  Nervous, Anxious, on Edge 0 0 0 0  Control/stop worrying 2  0 0 0  Worry too much - different things 2 0 0 0  Trouble relaxing 0 0 0 0  Restless 0 0 0 0  Easily annoyed or irritable 0 0 0 0  Afraid - awful might happen 1 0 0 0  Total GAD 7 Score 5 0 0 0  Anxiety Difficulty Somewhat difficult Not difficult at all Not difficult at all Not difficult at all       01/04/2023    4:01 PM 09/09/2022   11:35 AM 07/14/2022   10:01 AM  Depression screen PHQ 2/9  Decreased Interest 3 0 0  Down, Depressed, Hopeless 3 0 0  PHQ - 2 Score 6 0 0  Altered sleeping 3 0 0  Tired, decreased energy 1 2 0  Change in appetite 3 0 0  Feeling bad or failure about yourself  1 0 0  Trouble concentrating 0 0 0  Moving slowly or fidgety/restless 0 0 0  Suicidal thoughts 2 0 0  PHQ-9 Score 16 2 0  Difficult doing work/chores Very difficult Not difficult at all Not difficult at all    BP Readings from Last 3 Encounters:  01/04/23 138/84  11/09/22 139/81  09/09/22 118/70    Physical Exam Vitals and nursing note reviewed.  HENT:     Right Ear: Tympanic membrane, ear canal and external ear normal. There is no impacted cerumen.     Left Ear: Tympanic membrane and external ear normal. There is  no impacted cerumen.     Nose: Nose normal. No congestion.     Mouth/Throat:     Mouth: Mucous membranes are moist.  Eyes:     Pupils: Pupils are equal, round, and reactive to light.  Cardiovascular:     Rate and Rhythm: Normal rate.     Heart sounds: No murmur heard.    No friction rub. No gallop.  Pulmonary:     Breath sounds: No wheezing, rhonchi or rales.  Chest:     Chest wall: Tenderness present.  Abdominal:     Tenderness: There is no guarding.  Musculoskeletal:     Cervical back: Normal range of motion.  Neurological:     Mental Status: She is alert.     Wt Readings from Last 3 Encounters:  01/04/23 198 lb (89.8 kg)  11/09/22 210 lb (95.3 kg)  09/09/22 210 lb (95.3 kg)    BP 138/84   Pulse (!) 110   Ht 5\' 3"  (1.6 m)   Wt 198 lb (89.8 kg)   SpO2 95%   BMI 35.07 kg/m   Assessment and Plan:  1. Adjustment reaction with anxiety and depression Relatively new onset with a chronic problem that has been controlled without medication for the past year.  Patient used to be on sertraline 100 mg and currently is experiencing the moving out of her granddaughter and her great grandchild who are living with her at this time.  So we will resume sertraline but will do so slowly 25 mg once a day for 2 weeks and then increase to 50 mg (2 x 25 mg) for 1 week and we will see on recheck how patient is tolerating the medication.   Elizabeth Sauer, MD

## 2023-01-14 ENCOUNTER — Ambulatory Visit (INDEPENDENT_AMBULATORY_CARE_PROVIDER_SITE_OTHER): Payer: Medicare HMO | Admitting: Family Medicine

## 2023-01-14 ENCOUNTER — Encounter: Payer: Self-pay | Admitting: Family Medicine

## 2023-01-14 VITALS — BP 108/60 | HR 72 | Ht 63.0 in | Wt 198.0 lb

## 2023-01-14 DIAGNOSIS — F4323 Adjustment disorder with mixed anxiety and depressed mood: Secondary | ICD-10-CM

## 2023-01-14 MED ORDER — SERTRALINE HCL 50 MG PO TABS: 50.0000 mg | ORAL_TABLET | Freq: Every day | ORAL | 3 refills | Status: AC

## 2023-01-14 NOTE — Progress Notes (Signed)
Date:  01/14/2023   Name:  Kirsten Snyder   DOB:  04-11-1934   MRN:  [id number]   Chief Complaint: Depression (F/up from starting sertraline/ 16-9 and same on GAD at 5)  Depression         Lab Results  Component Value Date   NA 134 (L) 09/09/2022   K 4.1 09/09/2022   CO2 27 09/09/2022   GLUCOSE 125 (H) 09/09/2022   BUN 7 (L) 09/09/2022   CREATININE 0.86 09/09/2022   CALCIUM 8.6 (L) 09/09/2022   EGFR 59 (L) 07/14/2022   GFRNONAA >60 09/09/2022   Lab Results  Component Value Date   CHOL 245 (H) 07/14/2022   HDL 53 07/14/2022   LDLCALC 162 (H) 07/14/2022   TRIG 163 (H) 07/14/2022   CHOLHDL 4.8 (H) 11/02/2017   Lab Results  Component Value Date   TSH 2.39 09/15/2012   Lab Results  Component Value Date   HGBA1C 5.6 01/13/2022   Lab Results  Component Value Date   WBC 6.4 02/24/2022   HGB 13.0 02/24/2022   HCT 38.7 02/24/2022   MCV 92.1 02/24/2022   PLT 259 02/24/2022   Lab Results  Component Value Date   ALT 10 01/13/2022   AST 14 01/13/2022   ALKPHOS 75 01/13/2022   BILITOT 0.9 01/13/2022   No results found for: "25OHVITD2", "25OHVITD3", "VD25OH"   Review of Systems  Psychiatric/Behavioral:  Positive for depression.     Patient Active Problem List   Diagnosis Date Noted   Anxiety 03/04/2021   H/O total hysterectomy 08/05/2020   Fall at home, initial encounter 02/27/2020   S/P resection of meningioma 10/31/2019   Pulmonary emboli (HCC) 10/31/2019   Urinary tract infection, site not specified 10/03/2019   Sepsis secondary to UTI (HCC) 10/01/2019   Sepsis, unspecified organism (HCC) 10/01/2019   Hypokalemia 10/01/2019   Chronic obstructive pulmonary disease, unspecified (HCC) 09/22/2019   Difficulty in walking, not elsewhere classified 09/22/2019   Encounter for surgical aftercare following surgery on the nervous system 09/22/2019   Gout, unspecified 09/22/2019   Insomnia, unspecified 09/22/2019   Major depressive disorder, recurrent,  unspecified (HCC) 09/22/2019   Muscle weakness (generalized) 09/22/2019   Need for assistance with personal care 09/22/2019   Other lack of coordination 09/22/2019   Other symptoms and signs involving cognitive functions and awareness 09/22/2019   Personal history of breast cancer 09/22/2019   Unsteadiness on feet 09/22/2019   Weakness 09/22/2019   Gastro-esophageal reflux disease without esophagitis 09/22/2019   Unspecified asthma, uncomplicated 09/22/2019   Unspecified osteoarthritis, unspecified site 09/22/2019   Hyperlipidemia, unspecified 09/22/2019   Urinary urgency 09/15/2019   Atherosclerosis of abdominal aorta (HCC) 10/12/2018   SOBOE (shortness of breath on exertion) 10/12/2018   Osteopenia of neck of right femur 06/29/2018   Breast cancer, right (HCC) 06/17/2016   Essential (primary) hypertension 12/13/2015   Breast abscess of female 11/07/2015   Familial multiple lipoprotein-type hyperlipidemia 10/25/2014   Clinical depression 10/25/2014   Acid reflux 10/25/2014   Acute gastrointestinal bleeding 10/25/2014   Aggrieved 10/25/2014   Airway hyperreactivity 10/25/2014   Arthritis of knee 10/25/2014   H/O: gout 10/25/2014   Adiposity 10/25/2014   Arthritis, degenerative 10/25/2014   H/O adenomatous polyp of colon 03/07/2014    Allergies  Allergen Reactions   Sulfa Antibiotics     Past Surgical History:  Procedure Laterality Date   BREAST BIOPSY Bilateral    negative   BREAST EXCISIONAL BIOPSY Right  right positive 04/2010   BREAST LUMPECTOMY Right    removed a cancerous lump   VAGINAL HYSTERECTOMY      Social History   Tobacco Use   Smoking status: Former    Current packs/day: 0.25    Average packs/day: 0.3 packs/day for 2.0 years (0.5 ttl pk-yrs)    Types: Cigarettes    Passive exposure: Past   Smokeless tobacco: Never   Tobacco comments:    smoking cessation materials not required  Vaping Use   Vaping status: Never Used  Substance Use Topics    Alcohol use: Not Currently   Drug use: Never     Medication list has been reviewed and updated.  Current Meds  Medication Sig   albuterol (VENTOLIN HFA) 108 (90 Base) MCG/ACT inhaler Inhale 2 puffs into the lungs every 6 (six) hours as needed for wheezing or shortness of breath. Include aerochamber   allopurinol (ZYLOPRIM) 100 MG tablet TAKE (1) TABLET BY MOUTH EVERY DAY   calcium carbonate (TUMS - DOSED IN MG ELEMENTAL CALCIUM) 500 MG chewable tablet Chew 1 tablet by mouth every 8 (eight) hours as needed for indigestion.   cetirizine (ZYRTEC) 10 MG tablet Take 10 mg by mouth daily. For allergies.   estradiol (ESTRACE) 0.1 MG/GM vaginal cream Estrogen Cream Instruction Discard applicator Apply pea sized amount to tip of finger to urethra before bed. Wash hands well after application. Use Monday, Wednesday and Friday   ferrous sulfate 325 (65 FE) MG tablet Take 325 mg by mouth daily with breakfast.   Fluticasone-Umeclidin-Vilant (TRELEGY ELLIPTA) 100-62.5-25 MCG/ACT AEPB Inhale 1 puff into the lungs daily.   losartan (COZAAR) 25 MG tablet Take 1 by mouth daily   lovastatin (MEVACOR) 40 MG tablet TAKE 1 TABLET EVERY DAY AT BEDTIME   meclizine (ANTIVERT) 12.5 MG tablet Take 1 tablet (12.5 mg total) by mouth 3 (three) times daily as needed for dizziness.   montelukast (SINGULAIR) 10 MG tablet Take 1 tablet (10 mg total) by mouth at bedtime.   mupirocin ointment (BACTROBAN) 2 % Apply 1 Application topically 2 (two) times daily.   nystatin cream (MYCOSTATIN) APPLY 1 APPLICATION TOPICALLY TWICE DAILY   omeprazole (PRILOSEC) 20 MG capsule One a day   polyethylene glycol (MIRALAX / GLYCOLAX) 17 g packet Take 17 g by mouth daily as needed.   sertraline (ZOLOFT) 25 MG tablet Take 1 tablet (25 mg total) by mouth daily.   traZODone (DESYREL) 50 MG tablet TAKE 1 TABLET EVERY DAY AT BEDTIME   trospium (SANCTURA) 20 MG tablet Take 1 tablet (20 mg total) by mouth 2 (two) times daily.       01/14/2023    11:07 AM 01/04/2023    4:05 PM 09/09/2022   11:36 AM 07/14/2022   10:01 AM  GAD 7 : Generalized Anxiety Score  Nervous, Anxious, on Edge 0 0 0 0  Control/stop worrying 2 2 0 0  Worry too much - different things 2 2 0 0  Trouble relaxing 0 0 0 0  Restless 0 0 0 0  Easily annoyed or irritable 0 0 0 0  Afraid - awful might happen 1 1 0 0  Total GAD 7 Score 5 5 0 0  Anxiety Difficulty Not difficult at all Somewhat difficult Not difficult at all Not difficult at all       01/14/2023   11:04 AM 01/04/2023    4:01 PM 09/09/2022   11:35 AM  Depression screen PHQ 2/9  Decreased Interest 1 3 0  Down, Depressed, Hopeless 2 3 0  PHQ - 2 Score 3 6 0  Altered sleeping 1 3 0  Tired, decreased energy 1 1 2   Change in appetite 3 3 0  Feeling bad or failure about yourself  0 1 0  Trouble concentrating 0 0 0  Moving slowly or fidgety/restless 0 0 0  Suicidal thoughts 1 2 0  PHQ-9 Score 9 16 2   Difficult doing work/chores Somewhat difficult Very difficult Not difficult at all    BP Readings from Last 3 Encounters:  01/14/23 108/60  01/04/23 138/84  11/09/22 139/81    Physical Exam Vitals and nursing note reviewed. Exam conducted with a chaperone present.  Constitutional:      General: She is not in acute distress.    Appearance: She is not diaphoretic.  HENT:     Head: Normocephalic and atraumatic.     Right Ear: Tympanic membrane and external ear normal.     Left Ear: Tympanic membrane and external ear normal.     Nose: Nose normal. No congestion.     Mouth/Throat:     Mouth: Mucous membranes are moist.     Pharynx: No oropharyngeal exudate or posterior oropharyngeal erythema.  Eyes:     General:        Right eye: No discharge.        Left eye: No discharge.     Conjunctiva/sclera: Conjunctivae normal.     Pupils: Pupils are equal, round, and reactive to light.  Neck:     Thyroid: No thyromegaly.     Vascular: No JVD.  Cardiovascular:     Rate and Rhythm: Normal rate and  regular rhythm.     Heart sounds: Normal heart sounds. No murmur heard.    No friction rub. No gallop.  Pulmonary:     Effort: Pulmonary effort is normal. No respiratory distress.     Breath sounds: Normal breath sounds. No stridor. No wheezing, rhonchi or rales.  Abdominal:     General: Bowel sounds are normal.     Palpations: Abdomen is soft. There is no mass.     Tenderness: There is no abdominal tenderness. There is no guarding.  Musculoskeletal:        General: No deformity. Normal range of motion.     Cervical back: Normal range of motion and neck supple.  Lymphadenopathy:     Cervical: No cervical adenopathy.  Skin:    General: Skin is warm and dry.     Findings: No lesion.  Neurological:     Mental Status: She is alert.     Deep Tendon Reflexes: Reflexes are normal and symmetric.     Wt Readings from Last 3 Encounters:  01/14/23 198 lb (89.8 kg)  01/04/23 198 lb (89.8 kg)  11/09/22 210 lb (95.3 kg)    BP 108/60   Pulse 72   Ht 5\' 3"  (1.6 m)   Wt 198 lb (89.8 kg)   SpO2 95%   BMI 35.07 kg/m   Assessment and Plan: 1. Adjustment reaction with anxiety and depression Chronic.  Controlled.  Stable.  Recent initiated sertraline 25 mg once a day with some mild improvement.  PHQ was 9 GAD score is 5.  We will increase sertraline to 50 mg once a day and will recheck patient in 6 weeks.  In the meantime the adjustment situation is in the process of the vomiting and patient is adapting.    Elizabeth Sauer, MD

## 2023-02-09 ENCOUNTER — Ambulatory Visit: Payer: Self-pay | Admitting: *Deleted

## 2023-02-09 NOTE — Telephone Encounter (Signed)
Pt's son is calling in because pt is taking sertraline (ZOLOFT) 50 MG tablet [[id number]] and pt says it is not working and they would like to speak with the provider regarding this. Pt was originally on 25 MG and was told to double the dosage, but it's still not working for the depression.   Reason for Disposition  [1] Depression AND [2] worsening (e.g., sleeping poorly, less able to do activities of daily living)  Answer Assessment - Initial Assessment Questions 1. CONCERN: "What happened that made you call today?"     Depression- same since increased dosing of medication 2. DEPRESSION SYMPTOM SCREENING: "How are you feeling overall?" (e.g., decreased energy, increased sleeping or difficulty sleeping, difficulty concentrating, feelings of sadness, guilt, hopelessness, or worthlessness)     Patient is thinking about everything 3. RISK OF HARM - SUICIDAL IDEATION:  "Do you ever have thoughts of hurting or killing yourself?"  (e.g., yes, no, no but preoccupation with thoughts about death)   - INTENT:  "Do you have thoughts of hurting or killing yourself right NOW?" (e.g., yes, no, N/A)   - PLAN: "Do you have a specific plan for how you would do this?" (e.g., gun, knife, overdose, no plan, N/A)     Yes- patient is aware she does not need to do that- overdose- no plan- has had thoughts 4. RISK OF HARM - HOMICIDAL IDEATION:  "Do you ever have thoughts of hurting or killing someone else?"  (e.g., yes, no, no but preoccupation with thoughts about death)   - INTENT:  "Do you have thoughts of hurting or killing someone right NOW?" (e.g., yes, no, N/A)   - PLAN: "Do you have a specific plan for how you would do this?" (e.g., gun, knife, no plan, N/A)      no 5. FUNCTIONAL IMPAIRMENT: "How have things been going for you overall? Have you had more difficulty than usual doing your normal daily activities?"  (e.g., better, same, worse; self-care, school, work, interactions)     Patient is not doing self care-  decreased bathing- feels like she is going to pass out 6. SUPPORT: "Who is with you now?" "Who do you live with?" "Do you have family or friends who you can talk to?"      Patient is going to be by herself soon- has no idea of what she is going to do- family lives beside her 7. THERAPIST: "Do you have a counselor or therapist? Name?"     No therapist  8. STRESSORS: "Has there been any new stress or recent changes in your life?"     Patient has been upset over granddaughter moving out, patient has had multiple falls- unable to walk good- pain 9. ALCOHOL USE OR SUBSTANCE USE (DRUG USE): "Do you drink alcohol or use any illegal drugs?"     no 10. OTHER: "Do you have any other physical symptoms right now?" (e.g., fever)       no  Protocols used: Depression-A-AH

## 2023-02-09 NOTE — Telephone Encounter (Signed)
  Chief Complaint: depression- medication is not helping- patient tearful Symptoms: patient states she has recently increased her antidepressant dosing without improvement- patient states the depression is not worse- just not better. Patient is tearful and  has had thoughts of ending things- but states her faith stops her.  Frequency: chronic depression since loss of husband- but worse recently- granddaughter is moving out and she is going to be alone- patient has had recent falls and does not feel she is up to living alone. Patient has chronic pain and has had recent falls.  Pertinent Negatives: Patient denies suicidal plan- although she states if she did try to leave- she would take overdose.  Disposition: [x] ED /Urgent Care (no appt availability in office) / [] Appointment(In office/virtual)/ []  [facility]/ [] Home Care/ [] Refused Recommended Disposition /[] [facility]/ []  Follow-up with PCP Additional Notes: Patient advised ED - but she will not go- she does agree to see provider- PCP is not in office tomorrow- patient states she only wants to see PCP . No open slot on Thursday- patient advised I would send message to PCP to see if she can be scheduled.

## 2023-02-10 NOTE — Telephone Encounter (Signed)
Please call pt to schedule an appointment.  KP

## 2023-02-11 ENCOUNTER — Ambulatory Visit (INDEPENDENT_AMBULATORY_CARE_PROVIDER_SITE_OTHER): Payer: Medicare HMO | Admitting: Family Medicine

## 2023-02-11 ENCOUNTER — Encounter: Payer: Self-pay | Admitting: Family Medicine

## 2023-02-11 VITALS — BP 118/74 | HR 91 | Ht 63.0 in | Wt 200.0 lb

## 2023-02-11 DIAGNOSIS — N3001 Acute cystitis with hematuria: Secondary | ICD-10-CM | POA: Diagnosis not present

## 2023-02-11 DIAGNOSIS — F4322 Adjustment disorder with anxiety: Secondary | ICD-10-CM | POA: Diagnosis not present

## 2023-02-11 DIAGNOSIS — F32A Depression, unspecified: Secondary | ICD-10-CM | POA: Diagnosis not present

## 2023-02-11 DIAGNOSIS — F329 Major depressive disorder, single episode, unspecified: Secondary | ICD-10-CM

## 2023-02-11 DIAGNOSIS — F419 Anxiety disorder, unspecified: Secondary | ICD-10-CM

## 2023-02-11 DIAGNOSIS — F4323 Adjustment disorder with mixed anxiety and depressed mood: Secondary | ICD-10-CM | POA: Diagnosis not present

## 2023-02-11 DIAGNOSIS — R9431 Abnormal electrocardiogram [ECG] [EKG]: Secondary | ICD-10-CM | POA: Diagnosis not present

## 2023-02-11 DIAGNOSIS — Z7951 Long term (current) use of inhaled steroids: Secondary | ICD-10-CM | POA: Diagnosis not present

## 2023-02-11 DIAGNOSIS — I10 Essential (primary) hypertension: Secondary | ICD-10-CM | POA: Diagnosis not present

## 2023-02-11 DIAGNOSIS — R443 Hallucinations, unspecified: Secondary | ICD-10-CM | POA: Diagnosis not present

## 2023-02-11 DIAGNOSIS — J449 Chronic obstructive pulmonary disease, unspecified: Secondary | ICD-10-CM | POA: Diagnosis not present

## 2023-02-11 DIAGNOSIS — N390 Urinary tract infection, site not specified: Secondary | ICD-10-CM | POA: Diagnosis not present

## 2023-02-11 DIAGNOSIS — Z882 Allergy status to sulfonamides status: Secondary | ICD-10-CM | POA: Diagnosis not present

## 2023-02-11 NOTE — Progress Notes (Signed)
Date:  02/11/2023   Name:  Kirsten Snyder   DOB:  [birth year]   MRN:  [id number]   Chief Complaint: Depression  Depression        This is a new problem.  The current episode started in the past 7 days.   The problem occurs constantly.  The problem has been rapidly worsening since onset.  Associated symptoms include decreased concentration, fatigue, helplessness, hopelessness, insomnia, irritable, restlessness, decreased interest, appetite change, sad and suicidal ideas.  Past treatments include SSRIs - Selective serotonin reuptake inhibitors.  Past medical history includes anxiety.   Anxiety Presents for follow-up visit. Symptoms include decreased concentration, depressed mood, excessive worry, insomnia, irritability, nervous/anxious behavior, restlessness and suicidal ideas. Patient reports no chest pain or shortness of breath.      Lab Results  Component Value Date   NA 134 (L) 09/09/2022   K 4.1 09/09/2022   CO2 27 09/09/2022   GLUCOSE 125 (H) 09/09/2022   BUN 7 (L) 09/09/2022   CREATININE 0.86 09/09/2022   CALCIUM 8.6 (L) 09/09/2022   EGFR 59 (L) 07/14/2022   GFRNONAA >60 09/09/2022   Lab Results  Component Value Date   CHOL 245 (H) 07/14/2022   HDL 53 07/14/2022   LDLCALC 162 (H) 07/14/2022   TRIG 163 (H) 07/14/2022   CHOLHDL 4.8 (H) 11/02/2017   Lab Results  Component Value Date   TSH 2.39 09/15/2012   Lab Results  Component Value Date   HGBA1C 5.6 01/13/2022   Lab Results  Component Value Date   WBC 6.4 02/24/2022   HGB 13.0 02/24/2022   HCT 38.7 02/24/2022   MCV 92.1 02/24/2022   PLT 259 02/24/2022   Lab Results  Component Value Date   ALT 10 01/13/2022   AST 14 01/13/2022   ALKPHOS 75 01/13/2022   BILITOT 0.9 01/13/2022   No results found for: "25OHVITD2", "25OHVITD3", "VD25OH"   Review of Systems  Constitutional:  Positive for appetite change, fatigue and irritability.  Respiratory:  Negative for chest tightness, shortness of breath and  wheezing.   Cardiovascular:  Negative for chest pain.  Psychiatric/Behavioral:  Positive for decreased concentration, depression, dysphoric mood, self-injury, sleep disturbance and suicidal ideas. The patient is nervous/anxious and has insomnia.     Patient Active Problem List   Diagnosis Date Noted   Anxiety 03/04/2021   H/O total hysterectomy 08/05/2020   Fall at home, initial encounter 02/27/2020   S/P resection of meningioma 10/31/2019   Pulmonary emboli (HCC) 10/31/2019   Urinary tract infection, site not specified 10/03/2019   Sepsis secondary to UTI (HCC) 10/01/2019   Sepsis, unspecified organism (HCC) 10/01/2019   Hypokalemia 10/01/2019   Chronic obstructive pulmonary disease, unspecified (HCC) 09/22/2019   Difficulty in walking, not elsewhere classified 09/22/2019   Encounter for surgical aftercare following surgery on the nervous system 09/22/2019   Gout, unspecified 09/22/2019   Insomnia, unspecified 09/22/2019   Major depressive disorder, recurrent, unspecified (HCC) 09/22/2019   Muscle weakness (generalized) 09/22/2019   Need for assistance with personal care 09/22/2019   Other lack of coordination 09/22/2019   Other symptoms and signs involving cognitive functions and awareness 09/22/2019   Personal history of breast cancer 09/22/2019   Unsteadiness on feet 09/22/2019   Weakness 09/22/2019   Gastro-esophageal reflux disease without esophagitis 09/22/2019   Unspecified asthma, uncomplicated 09/22/2019   Unspecified osteoarthritis, unspecified site 09/22/2019   Hyperlipidemia, unspecified 09/22/2019   Urinary urgency 09/15/2019   Atherosclerosis of abdominal aorta (HCC) 10/12/2018  SOBOE (shortness of breath on exertion) 10/12/2018   Osteopenia of neck of right femur 06/29/2018   Breast cancer, right (HCC) 06/17/2016   Essential (primary) hypertension 12/13/2015   Breast abscess of female 11/07/2015   Familial multiple lipoprotein-type hyperlipidemia 10/25/2014    Clinical depression 10/25/2014   Acid reflux 10/25/2014   Acute gastrointestinal bleeding 10/25/2014   Aggrieved 10/25/2014   Airway hyperreactivity 10/25/2014   Arthritis of knee 10/25/2014   H/O: gout 10/25/2014   Adiposity 10/25/2014   Arthritis, degenerative 10/25/2014   H/O adenomatous polyp of colon 03/07/2014    Allergies  Allergen Reactions   Sulfa Antibiotics     Past Surgical History:  Procedure Laterality Date   BREAST BIOPSY Bilateral    negative   BREAST EXCISIONAL BIOPSY Right    right positive 04/2010   BREAST LUMPECTOMY Right    removed a cancerous lump   VAGINAL HYSTERECTOMY      Social History   Tobacco Use   Smoking status: Former    Current packs/day: 0.25    Average packs/day: 0.3 packs/day for 2.0 years (0.5 ttl pk-yrs)    Types: Cigarettes    Passive exposure: Past   Smokeless tobacco: Never   Tobacco comments:    smoking cessation materials not required  Vaping Use   Vaping status: Never Used  Substance Use Topics   Alcohol use: Not Currently   Drug use: Never     Medication list has been reviewed and updated.  Current Meds  Medication Sig   albuterol (VENTOLIN HFA) 108 (90 Base) MCG/ACT inhaler Inhale 2 puffs into the lungs every 6 (six) hours as needed for wheezing or shortness of breath. Include aerochamber   allopurinol (ZYLOPRIM) 100 MG tablet TAKE (1) TABLET BY MOUTH EVERY DAY   calcium carbonate (TUMS - DOSED IN MG ELEMENTAL CALCIUM) 500 MG chewable tablet Chew 1 tablet by mouth every 8 (eight) hours as needed for indigestion.   cetirizine (ZYRTEC) 10 MG tablet Take 10 mg by mouth daily. For allergies.   estradiol (ESTRACE) 0.1 MG/GM vaginal cream Estrogen Cream Instruction Discard applicator Apply pea sized amount to tip of finger to urethra before bed. Wash hands well after application. Use Monday, Wednesday and Friday   ferrous sulfate 325 (65 FE) MG tablet Take 325 mg by mouth daily with breakfast.    Fluticasone-Umeclidin-Vilant (TRELEGY ELLIPTA) 100-62.5-25 MCG/ACT AEPB Inhale 1 puff into the lungs daily.   losartan (COZAAR) 25 MG tablet Take 1 by mouth daily   lovastatin (MEVACOR) 40 MG tablet TAKE 1 TABLET EVERY DAY AT BEDTIME   meclizine (ANTIVERT) 12.5 MG tablet Take 1 tablet (12.5 mg total) by mouth 3 (three) times daily as needed for dizziness.   montelukast (SINGULAIR) 10 MG tablet Take 1 tablet (10 mg total) by mouth at bedtime.   mupirocin ointment (BACTROBAN) 2 % Apply 1 Application topically 2 (two) times daily.   nystatin cream (MYCOSTATIN) APPLY 1 APPLICATION TOPICALLY TWICE DAILY   omeprazole (PRILOSEC) 20 MG capsule One a day   polyethylene glycol (MIRALAX / GLYCOLAX) 17 g packet Take 17 g by mouth daily as needed.   sertraline (ZOLOFT) 50 MG tablet Take 1 tablet (50 mg total) by mouth daily.   traZODone (DESYREL) 50 MG tablet TAKE 1 TABLET EVERY DAY AT BEDTIME   trospium (SANCTURA) 20 MG tablet Take 1 tablet (20 mg total) by mouth 2 (two) times daily.       02/11/2023    4:21 PM 01/14/2023   11:07  AM 01/04/2023    4:05 PM 09/09/2022   11:36 AM  GAD 7 : Generalized Anxiety Score  Nervous, Anxious, on Edge 3 0 0 0  Control/stop worrying 3 2 2  0  Worry too much - different things 3 2 2  0  Trouble relaxing 3 0 0 0  Restless 3 0 0 0  Easily annoyed or irritable 3 0 0 0  Afraid - awful might happen 3 1 1  0  Total GAD 7 Score 21 5 5  0  Anxiety Difficulty Extremely difficult Not difficult at all Somewhat difficult Not difficult at all       02/11/2023    4:21 PM 01/14/2023   11:04 AM 01/04/2023    4:01 PM  Depression screen PHQ 2/9  Decreased Interest 3 1 3   Down, Depressed, Hopeless 3 2 3   PHQ - 2 Score 6 3 6   Altered sleeping 3 1 3   Tired, decreased energy 3 1 1   Change in appetite 3 3 3   Feeling bad or failure about yourself  3 0 1  Trouble concentrating 3 0 0  Moving slowly or fidgety/restless 3 0 0  Suicidal thoughts 3 1 2   PHQ-9 Score 27 9 16   Difficult  doing work/chores Extremely dIfficult Somewhat difficult Very difficult    BP Readings from Last 3 Encounters:  02/11/23 118/74  01/14/23 108/60  01/04/23 138/84    Physical Exam Vitals and nursing note reviewed.  Constitutional:      General: She is irritable.  HENT:     Head: Normocephalic.     Right Ear: Tympanic membrane normal.     Left Ear: Tympanic membrane normal.     Nose: Nose normal.     Mouth/Throat:     Mouth: Mucous membranes are moist.  Eyes:     Pupils: Pupils are equal, round, and reactive to light.  Cardiovascular:     Rate and Rhythm: Normal rate.     Heart sounds: No murmur heard.    No friction rub. No gallop.  Pulmonary:     Breath sounds: No wheezing, rhonchi or rales.  Musculoskeletal:     Cervical back: Normal range of motion.  Neurological:     Mental Status: She is alert.     Wt Readings from Last 3 Encounters:  02/11/23 200 lb (90.7 kg)  01/14/23 198 lb (89.8 kg)  01/04/23 198 lb (89.8 kg)    BP 118/74   Pulse 91   Ht 5\' 3"  (1.6 m)   Wt 200 lb (90.7 kg)   SpO2 92%   BMI 35.43 kg/m   Assessment and Plan:  1. Reactive depression New onset.  Uncontrolled.  Unstable.  PHQ is 27.  Relatively new onset but this is due to a reactive depression due to her granddaughter moving out her household with her great grandchild.  Patient is not handling the stress and this weekend marks the time in which possessions are going to be moved out of the house as well and patient is not adjusting to this and is in fact worsened over the past 48 hours with thoughts of self-injury/suicidal.  Patient is crying and is unable to adjust to the impending move and has been on sertraline 50 mg and will likely need to have have an increase but probably at this point in time needs to have face-to-face psychiatric evaluation to assess whether or not indeed she is in danger of herself or others.  Suggestion is to [facility] to a [facility]  facility with psychiatric on-call to  discuss the concerns of her self-injury and possible adjustment of medication and arrangement of outpatient follow-up.  2. Anxiety New onset.  Uncontrolled.  Unstable.  GAD score is 21.  Patient is also having anxiety with excessive worry and has, constant feelings of anxiety near panic attack.  This is all due to an adjustment to the moving out of her grandchild and the moving out of her great grandchild as well.  This weekend culminates with the removal of possessions and finalization of the moving out at which time the grandmother has not handled the stress and is continued to deteriorate despite medication.  Because of her anxiety increasing and the possibility of self injury and the family is unable to stay at home with her otherwise patient will be alone we have [facility] to [facility] for discussion with psychiatrist on-call for possible intervention.   Elizabeth Sauer, MD

## 2023-02-12 DIAGNOSIS — N390 Urinary tract infection, site not specified: Secondary | ICD-10-CM | POA: Diagnosis not present

## 2023-02-12 DIAGNOSIS — F4323 Adjustment disorder with mixed anxiety and depressed mood: Secondary | ICD-10-CM | POA: Diagnosis not present

## 2023-02-18 DIAGNOSIS — R2681 Unsteadiness on feet: Secondary | ICD-10-CM | POA: Diagnosis not present

## 2023-02-18 DIAGNOSIS — E559 Vitamin D deficiency, unspecified: Secondary | ICD-10-CM | POA: Diagnosis not present

## 2023-02-18 DIAGNOSIS — K219 Gastro-esophageal reflux disease without esophagitis: Secondary | ICD-10-CM | POA: Diagnosis not present

## 2023-02-18 DIAGNOSIS — M85851 Other specified disorders of bone density and structure, right thigh: Secondary | ICD-10-CM | POA: Diagnosis not present

## 2023-02-18 DIAGNOSIS — F331 Major depressive disorder, recurrent, moderate: Secondary | ICD-10-CM | POA: Diagnosis not present

## 2023-02-18 DIAGNOSIS — Z23 Encounter for immunization: Secondary | ICD-10-CM | POA: Diagnosis not present

## 2023-02-18 DIAGNOSIS — M1990 Unspecified osteoarthritis, unspecified site: Secondary | ICD-10-CM | POA: Diagnosis not present

## 2023-02-18 DIAGNOSIS — Z66 Do not resuscitate: Secondary | ICD-10-CM | POA: Diagnosis not present

## 2023-02-18 DIAGNOSIS — J449 Chronic obstructive pulmonary disease, unspecified: Secondary | ICD-10-CM | POA: Diagnosis not present

## 2023-03-04 ENCOUNTER — Ambulatory Visit: Payer: Medicare HMO | Admitting: Family Medicine

## 2023-03-04 DIAGNOSIS — M6284 Sarcopenia: Secondary | ICD-10-CM | POA: Diagnosis not present

## 2023-03-04 DIAGNOSIS — E559 Vitamin D deficiency, unspecified: Secondary | ICD-10-CM | POA: Diagnosis not present

## 2023-03-04 DIAGNOSIS — M85851 Other specified disorders of bone density and structure, right thigh: Secondary | ICD-10-CM | POA: Diagnosis not present

## 2023-03-04 DIAGNOSIS — M1990 Unspecified osteoarthritis, unspecified site: Secondary | ICD-10-CM | POA: Diagnosis not present

## 2023-03-04 DIAGNOSIS — E782 Mixed hyperlipidemia: Secondary | ICD-10-CM | POA: Diagnosis not present

## 2023-03-04 DIAGNOSIS — I700 Atherosclerosis of aorta: Secondary | ICD-10-CM | POA: Diagnosis not present

## 2023-03-04 DIAGNOSIS — K219 Gastro-esophageal reflux disease without esophagitis: Secondary | ICD-10-CM | POA: Diagnosis not present

## 2023-03-04 DIAGNOSIS — I10 Essential (primary) hypertension: Secondary | ICD-10-CM | POA: Diagnosis not present

## 2023-03-04 DIAGNOSIS — J4489 Other specified chronic obstructive pulmonary disease: Secondary | ICD-10-CM | POA: Diagnosis not present

## 2023-03-05 DIAGNOSIS — F331 Major depressive disorder, recurrent, moderate: Secondary | ICD-10-CM | POA: Diagnosis not present

## 2023-03-05 DIAGNOSIS — K5900 Constipation, unspecified: Secondary | ICD-10-CM | POA: Diagnosis not present

## 2023-03-08 DIAGNOSIS — M85851 Other specified disorders of bone density and structure, right thigh: Secondary | ICD-10-CM | POA: Diagnosis not present

## 2023-03-08 DIAGNOSIS — E559 Vitamin D deficiency, unspecified: Secondary | ICD-10-CM | POA: Diagnosis not present

## 2023-03-08 DIAGNOSIS — E782 Mixed hyperlipidemia: Secondary | ICD-10-CM | POA: Diagnosis not present

## 2023-03-08 DIAGNOSIS — J4489 Other specified chronic obstructive pulmonary disease: Secondary | ICD-10-CM | POA: Diagnosis not present

## 2023-03-08 DIAGNOSIS — I700 Atherosclerosis of aorta: Secondary | ICD-10-CM | POA: Diagnosis not present

## 2023-03-08 DIAGNOSIS — M1990 Unspecified osteoarthritis, unspecified site: Secondary | ICD-10-CM | POA: Diagnosis not present

## 2023-03-08 DIAGNOSIS — I10 Essential (primary) hypertension: Secondary | ICD-10-CM | POA: Diagnosis not present

## 2023-03-08 DIAGNOSIS — K219 Gastro-esophageal reflux disease without esophagitis: Secondary | ICD-10-CM | POA: Diagnosis not present

## 2023-03-08 DIAGNOSIS — M6284 Sarcopenia: Secondary | ICD-10-CM | POA: Diagnosis not present

## 2023-03-10 DIAGNOSIS — E559 Vitamin D deficiency, unspecified: Secondary | ICD-10-CM | POA: Diagnosis not present

## 2023-03-10 DIAGNOSIS — M1990 Unspecified osteoarthritis, unspecified site: Secondary | ICD-10-CM | POA: Diagnosis not present

## 2023-03-10 DIAGNOSIS — J4489 Other specified chronic obstructive pulmonary disease: Secondary | ICD-10-CM | POA: Diagnosis not present

## 2023-03-10 DIAGNOSIS — K219 Gastro-esophageal reflux disease without esophagitis: Secondary | ICD-10-CM | POA: Diagnosis not present

## 2023-03-10 DIAGNOSIS — I10 Essential (primary) hypertension: Secondary | ICD-10-CM | POA: Diagnosis not present

## 2023-03-10 DIAGNOSIS — I700 Atherosclerosis of aorta: Secondary | ICD-10-CM | POA: Diagnosis not present

## 2023-03-10 DIAGNOSIS — M85851 Other specified disorders of bone density and structure, right thigh: Secondary | ICD-10-CM | POA: Diagnosis not present

## 2023-03-10 DIAGNOSIS — M6284 Sarcopenia: Secondary | ICD-10-CM | POA: Diagnosis not present

## 2023-03-10 DIAGNOSIS — E782 Mixed hyperlipidemia: Secondary | ICD-10-CM | POA: Diagnosis not present

## 2023-03-11 DIAGNOSIS — I700 Atherosclerosis of aorta: Secondary | ICD-10-CM | POA: Diagnosis not present

## 2023-03-11 DIAGNOSIS — M1990 Unspecified osteoarthritis, unspecified site: Secondary | ICD-10-CM | POA: Diagnosis not present

## 2023-03-11 DIAGNOSIS — E559 Vitamin D deficiency, unspecified: Secondary | ICD-10-CM | POA: Diagnosis not present

## 2023-03-11 DIAGNOSIS — I10 Essential (primary) hypertension: Secondary | ICD-10-CM | POA: Diagnosis not present

## 2023-03-11 DIAGNOSIS — M85851 Other specified disorders of bone density and structure, right thigh: Secondary | ICD-10-CM | POA: Diagnosis not present

## 2023-03-11 DIAGNOSIS — M6284 Sarcopenia: Secondary | ICD-10-CM | POA: Diagnosis not present

## 2023-03-11 DIAGNOSIS — J4489 Other specified chronic obstructive pulmonary disease: Secondary | ICD-10-CM | POA: Diagnosis not present

## 2023-03-11 DIAGNOSIS — E782 Mixed hyperlipidemia: Secondary | ICD-10-CM | POA: Diagnosis not present

## 2023-03-11 DIAGNOSIS — K219 Gastro-esophageal reflux disease without esophagitis: Secondary | ICD-10-CM | POA: Diagnosis not present

## 2023-03-16 DIAGNOSIS — M85851 Other specified disorders of bone density and structure, right thigh: Secondary | ICD-10-CM | POA: Diagnosis not present

## 2023-03-16 DIAGNOSIS — E782 Mixed hyperlipidemia: Secondary | ICD-10-CM | POA: Diagnosis not present

## 2023-03-16 DIAGNOSIS — M1990 Unspecified osteoarthritis, unspecified site: Secondary | ICD-10-CM | POA: Diagnosis not present

## 2023-03-16 DIAGNOSIS — M6284 Sarcopenia: Secondary | ICD-10-CM | POA: Diagnosis not present

## 2023-03-16 DIAGNOSIS — K219 Gastro-esophageal reflux disease without esophagitis: Secondary | ICD-10-CM | POA: Diagnosis not present

## 2023-03-16 DIAGNOSIS — I700 Atherosclerosis of aorta: Secondary | ICD-10-CM | POA: Diagnosis not present

## 2023-03-16 DIAGNOSIS — J4489 Other specified chronic obstructive pulmonary disease: Secondary | ICD-10-CM | POA: Diagnosis not present

## 2023-03-16 DIAGNOSIS — E559 Vitamin D deficiency, unspecified: Secondary | ICD-10-CM | POA: Diagnosis not present

## 2023-03-16 DIAGNOSIS — I10 Essential (primary) hypertension: Secondary | ICD-10-CM | POA: Diagnosis not present

## 2023-03-17 ENCOUNTER — Other Ambulatory Visit: Payer: Self-pay | Admitting: Family Medicine

## 2023-03-17 DIAGNOSIS — K219 Gastro-esophageal reflux disease without esophagitis: Secondary | ICD-10-CM | POA: Diagnosis not present

## 2023-03-17 DIAGNOSIS — M85851 Other specified disorders of bone density and structure, right thigh: Secondary | ICD-10-CM | POA: Diagnosis not present

## 2023-03-17 DIAGNOSIS — I700 Atherosclerosis of aorta: Secondary | ICD-10-CM | POA: Diagnosis not present

## 2023-03-17 DIAGNOSIS — J4489 Other specified chronic obstructive pulmonary disease: Secondary | ICD-10-CM | POA: Diagnosis not present

## 2023-03-17 DIAGNOSIS — E782 Mixed hyperlipidemia: Secondary | ICD-10-CM | POA: Diagnosis not present

## 2023-03-17 DIAGNOSIS — E7849 Other hyperlipidemia: Secondary | ICD-10-CM

## 2023-03-17 DIAGNOSIS — I10 Essential (primary) hypertension: Secondary | ICD-10-CM | POA: Diagnosis not present

## 2023-03-17 DIAGNOSIS — M6284 Sarcopenia: Secondary | ICD-10-CM | POA: Diagnosis not present

## 2023-03-17 DIAGNOSIS — M1990 Unspecified osteoarthritis, unspecified site: Secondary | ICD-10-CM | POA: Diagnosis not present

## 2023-03-17 DIAGNOSIS — E559 Vitamin D deficiency, unspecified: Secondary | ICD-10-CM | POA: Diagnosis not present

## 2023-03-22 DIAGNOSIS — R2681 Unsteadiness on feet: Secondary | ICD-10-CM | POA: Diagnosis not present

## 2023-03-22 DIAGNOSIS — R5383 Other fatigue: Secondary | ICD-10-CM | POA: Diagnosis not present

## 2023-03-22 DIAGNOSIS — E559 Vitamin D deficiency, unspecified: Secondary | ICD-10-CM | POA: Diagnosis not present

## 2023-03-22 DIAGNOSIS — E785 Hyperlipidemia, unspecified: Secondary | ICD-10-CM | POA: Diagnosis not present

## 2023-03-22 DIAGNOSIS — I10 Essential (primary) hypertension: Secondary | ICD-10-CM | POA: Diagnosis not present

## 2023-03-23 DIAGNOSIS — E782 Mixed hyperlipidemia: Secondary | ICD-10-CM | POA: Diagnosis not present

## 2023-03-23 DIAGNOSIS — K219 Gastro-esophageal reflux disease without esophagitis: Secondary | ICD-10-CM | POA: Diagnosis not present

## 2023-03-23 DIAGNOSIS — M6284 Sarcopenia: Secondary | ICD-10-CM | POA: Diagnosis not present

## 2023-03-23 DIAGNOSIS — J4489 Other specified chronic obstructive pulmonary disease: Secondary | ICD-10-CM | POA: Diagnosis not present

## 2023-03-23 DIAGNOSIS — M1990 Unspecified osteoarthritis, unspecified site: Secondary | ICD-10-CM | POA: Diagnosis not present

## 2023-03-23 DIAGNOSIS — M85851 Other specified disorders of bone density and structure, right thigh: Secondary | ICD-10-CM | POA: Diagnosis not present

## 2023-03-23 DIAGNOSIS — I10 Essential (primary) hypertension: Secondary | ICD-10-CM | POA: Diagnosis not present

## 2023-03-23 DIAGNOSIS — E559 Vitamin D deficiency, unspecified: Secondary | ICD-10-CM | POA: Diagnosis not present

## 2023-03-23 DIAGNOSIS — I700 Atherosclerosis of aorta: Secondary | ICD-10-CM | POA: Diagnosis not present

## 2023-03-25 DIAGNOSIS — E782 Mixed hyperlipidemia: Secondary | ICD-10-CM | POA: Diagnosis not present

## 2023-03-25 DIAGNOSIS — M85851 Other specified disorders of bone density and structure, right thigh: Secondary | ICD-10-CM | POA: Diagnosis not present

## 2023-03-25 DIAGNOSIS — I700 Atherosclerosis of aorta: Secondary | ICD-10-CM | POA: Diagnosis not present

## 2023-03-25 DIAGNOSIS — M1990 Unspecified osteoarthritis, unspecified site: Secondary | ICD-10-CM | POA: Diagnosis not present

## 2023-03-25 DIAGNOSIS — J4489 Other specified chronic obstructive pulmonary disease: Secondary | ICD-10-CM | POA: Diagnosis not present

## 2023-03-25 DIAGNOSIS — M6284 Sarcopenia: Secondary | ICD-10-CM | POA: Diagnosis not present

## 2023-03-25 DIAGNOSIS — K219 Gastro-esophageal reflux disease without esophagitis: Secondary | ICD-10-CM | POA: Diagnosis not present

## 2023-03-25 DIAGNOSIS — I10 Essential (primary) hypertension: Secondary | ICD-10-CM | POA: Diagnosis not present

## 2023-03-25 DIAGNOSIS — E559 Vitamin D deficiency, unspecified: Secondary | ICD-10-CM | POA: Diagnosis not present

## 2023-03-29 DIAGNOSIS — I700 Atherosclerosis of aorta: Secondary | ICD-10-CM | POA: Diagnosis not present

## 2023-03-29 DIAGNOSIS — E782 Mixed hyperlipidemia: Secondary | ICD-10-CM | POA: Diagnosis not present

## 2023-03-29 DIAGNOSIS — I10 Essential (primary) hypertension: Secondary | ICD-10-CM | POA: Diagnosis not present

## 2023-03-29 DIAGNOSIS — K219 Gastro-esophageal reflux disease without esophagitis: Secondary | ICD-10-CM | POA: Diagnosis not present

## 2023-03-29 DIAGNOSIS — E559 Vitamin D deficiency, unspecified: Secondary | ICD-10-CM | POA: Diagnosis not present

## 2023-03-29 DIAGNOSIS — M1990 Unspecified osteoarthritis, unspecified site: Secondary | ICD-10-CM | POA: Diagnosis not present

## 2023-03-29 DIAGNOSIS — J4489 Other specified chronic obstructive pulmonary disease: Secondary | ICD-10-CM | POA: Diagnosis not present

## 2023-03-29 DIAGNOSIS — M85851 Other specified disorders of bone density and structure, right thigh: Secondary | ICD-10-CM | POA: Diagnosis not present

## 2023-03-29 DIAGNOSIS — M6284 Sarcopenia: Secondary | ICD-10-CM | POA: Diagnosis not present

## 2023-03-30 DIAGNOSIS — R5383 Other fatigue: Secondary | ICD-10-CM | POA: Diagnosis not present

## 2023-03-31 DIAGNOSIS — J302 Other seasonal allergic rhinitis: Secondary | ICD-10-CM | POA: Diagnosis not present

## 2023-03-31 DIAGNOSIS — E559 Vitamin D deficiency, unspecified: Secondary | ICD-10-CM | POA: Diagnosis not present

## 2023-03-31 DIAGNOSIS — I10 Essential (primary) hypertension: Secondary | ICD-10-CM | POA: Diagnosis not present

## 2023-03-31 DIAGNOSIS — J449 Chronic obstructive pulmonary disease, unspecified: Secondary | ICD-10-CM | POA: Diagnosis not present

## 2023-03-31 DIAGNOSIS — K219 Gastro-esophageal reflux disease without esophagitis: Secondary | ICD-10-CM | POA: Diagnosis not present

## 2023-03-31 DIAGNOSIS — F331 Major depressive disorder, recurrent, moderate: Secondary | ICD-10-CM | POA: Diagnosis not present

## 2023-03-31 DIAGNOSIS — N3281 Overactive bladder: Secondary | ICD-10-CM | POA: Diagnosis not present

## 2023-03-31 DIAGNOSIS — E538 Deficiency of other specified B group vitamins: Secondary | ICD-10-CM | POA: Diagnosis not present

## 2023-03-31 DIAGNOSIS — K5900 Constipation, unspecified: Secondary | ICD-10-CM | POA: Diagnosis not present

## 2023-04-01 DIAGNOSIS — M6284 Sarcopenia: Secondary | ICD-10-CM | POA: Diagnosis not present

## 2023-04-01 DIAGNOSIS — K219 Gastro-esophageal reflux disease without esophagitis: Secondary | ICD-10-CM | POA: Diagnosis not present

## 2023-04-01 DIAGNOSIS — I700 Atherosclerosis of aorta: Secondary | ICD-10-CM | POA: Diagnosis not present

## 2023-04-01 DIAGNOSIS — E782 Mixed hyperlipidemia: Secondary | ICD-10-CM | POA: Diagnosis not present

## 2023-04-01 DIAGNOSIS — I10 Essential (primary) hypertension: Secondary | ICD-10-CM | POA: Diagnosis not present

## 2023-04-01 DIAGNOSIS — M1990 Unspecified osteoarthritis, unspecified site: Secondary | ICD-10-CM | POA: Diagnosis not present

## 2023-04-01 DIAGNOSIS — J4489 Other specified chronic obstructive pulmonary disease: Secondary | ICD-10-CM | POA: Diagnosis not present

## 2023-04-01 DIAGNOSIS — M85851 Other specified disorders of bone density and structure, right thigh: Secondary | ICD-10-CM | POA: Diagnosis not present

## 2023-04-01 DIAGNOSIS — E559 Vitamin D deficiency, unspecified: Secondary | ICD-10-CM | POA: Diagnosis not present

## 2023-04-06 DIAGNOSIS — K219 Gastro-esophageal reflux disease without esophagitis: Secondary | ICD-10-CM | POA: Diagnosis not present

## 2023-04-06 DIAGNOSIS — M85852 Other specified disorders of bone density and structure, left thigh: Secondary | ICD-10-CM | POA: Diagnosis not present

## 2023-04-06 DIAGNOSIS — M1990 Unspecified osteoarthritis, unspecified site: Secondary | ICD-10-CM | POA: Diagnosis not present

## 2023-04-06 DIAGNOSIS — E559 Vitamin D deficiency, unspecified: Secondary | ICD-10-CM | POA: Diagnosis not present

## 2023-04-06 DIAGNOSIS — J4489 Other specified chronic obstructive pulmonary disease: Secondary | ICD-10-CM | POA: Diagnosis not present

## 2023-04-06 DIAGNOSIS — M6284 Sarcopenia: Secondary | ICD-10-CM | POA: Diagnosis not present

## 2023-04-06 DIAGNOSIS — E782 Mixed hyperlipidemia: Secondary | ICD-10-CM | POA: Diagnosis not present

## 2023-04-06 DIAGNOSIS — M85851 Other specified disorders of bone density and structure, right thigh: Secondary | ICD-10-CM | POA: Diagnosis not present

## 2023-04-06 DIAGNOSIS — I10 Essential (primary) hypertension: Secondary | ICD-10-CM | POA: Diagnosis not present

## 2023-04-06 DIAGNOSIS — I700 Atherosclerosis of aorta: Secondary | ICD-10-CM | POA: Diagnosis not present

## 2023-04-08 DIAGNOSIS — K219 Gastro-esophageal reflux disease without esophagitis: Secondary | ICD-10-CM | POA: Diagnosis not present

## 2023-04-08 DIAGNOSIS — M85851 Other specified disorders of bone density and structure, right thigh: Secondary | ICD-10-CM | POA: Diagnosis not present

## 2023-04-08 DIAGNOSIS — I10 Essential (primary) hypertension: Secondary | ICD-10-CM | POA: Diagnosis not present

## 2023-04-08 DIAGNOSIS — J4489 Other specified chronic obstructive pulmonary disease: Secondary | ICD-10-CM | POA: Diagnosis not present

## 2023-04-08 DIAGNOSIS — M6284 Sarcopenia: Secondary | ICD-10-CM | POA: Diagnosis not present

## 2023-04-08 DIAGNOSIS — M1990 Unspecified osteoarthritis, unspecified site: Secondary | ICD-10-CM | POA: Diagnosis not present

## 2023-04-08 DIAGNOSIS — E559 Vitamin D deficiency, unspecified: Secondary | ICD-10-CM | POA: Diagnosis not present

## 2023-04-08 DIAGNOSIS — E782 Mixed hyperlipidemia: Secondary | ICD-10-CM | POA: Diagnosis not present

## 2023-04-08 DIAGNOSIS — I700 Atherosclerosis of aorta: Secondary | ICD-10-CM | POA: Diagnosis not present

## 2023-04-22 ENCOUNTER — Other Ambulatory Visit: Payer: Self-pay | Admitting: Urology

## 2023-04-23 DIAGNOSIS — M85851 Other specified disorders of bone density and structure, right thigh: Secondary | ICD-10-CM | POA: Diagnosis not present

## 2023-04-23 DIAGNOSIS — F331 Major depressive disorder, recurrent, moderate: Secondary | ICD-10-CM | POA: Diagnosis not present

## 2023-05-07 DIAGNOSIS — F331 Major depressive disorder, recurrent, moderate: Secondary | ICD-10-CM | POA: Diagnosis not present

## 2023-05-07 DIAGNOSIS — R058 Other specified cough: Secondary | ICD-10-CM | POA: Diagnosis not present

## 2023-05-10 DIAGNOSIS — Z961 Presence of intraocular lens: Secondary | ICD-10-CM | POA: Diagnosis not present

## 2023-05-10 DIAGNOSIS — H353221 Exudative age-related macular degeneration, left eye, with active choroidal neovascularization: Secondary | ICD-10-CM | POA: Diagnosis not present

## 2023-05-19 DIAGNOSIS — F331 Major depressive disorder, recurrent, moderate: Secondary | ICD-10-CM | POA: Diagnosis not present

## 2023-05-25 DIAGNOSIS — R2681 Unsteadiness on feet: Secondary | ICD-10-CM | POA: Diagnosis not present

## 2023-05-25 DIAGNOSIS — M85851 Other specified disorders of bone density and structure, right thigh: Secondary | ICD-10-CM | POA: Diagnosis not present

## 2023-05-25 DIAGNOSIS — K219 Gastro-esophageal reflux disease without esophagitis: Secondary | ICD-10-CM | POA: Diagnosis not present

## 2023-06-16 DIAGNOSIS — M85851 Other specified disorders of bone density and structure, right thigh: Secondary | ICD-10-CM | POA: Diagnosis not present

## 2023-06-16 DIAGNOSIS — Z66 Do not resuscitate: Secondary | ICD-10-CM | POA: Diagnosis not present

## 2023-06-16 DIAGNOSIS — Z7983 Long term (current) use of bisphosphonates: Secondary | ICD-10-CM | POA: Diagnosis not present

## 2023-06-16 DIAGNOSIS — R2681 Unsteadiness on feet: Secondary | ICD-10-CM | POA: Diagnosis not present

## 2023-07-11 NOTE — ED Provider Notes (Addendum)
 [facility] Emergency Department Provider Note   ED Clinical Impression   Final diagnoses:  Syncope, unspecified syncope type (Primary)  Acute cystitis without hematuria    HPI, Medical Decision Making, ED Course   HPI: 89 y.o. female who breast cancer (s/p radiation therapy), meningoma (s/p craniectomy 2021), CHF, COPD, HLD, GERD, UTI, and HTN who presents with a syncopal episode. The patient reports experiencing a syncopal episode today while sitting outside getting her haircut. Negative head strike. She endorses nausea en route in the ambulance, but currently denies it. Per the patient's daughter at bedside, the patient was not fully responsive for 20-25 minutes, and was gradually able to communicate. The patient notes that she experienced changes in her vision prior to syncope, but denies experiencing chest pain or nausea prior to the episode. She notes she has not experienced anything similar to this in the past. She notes that her PO intake was limited today, but this was not associated with symptoms. Denies urinary or fecal incontinence. Denies chest pain, shortness of breath, abdominal pain, headache, fevers, chills, nausea, emesis, dysuria, hematuria, hematochezia or diarrhea.  EMS notes that the loss of consciousness moment was much shorter at 3 to 5 minutes.  Differential includes vasovagal syncope, seizure, ACS, GI bleed.  DDx/MDM: Patient is non-toxic appearing and in NAD. Vital signs are WNL. Physical exam is unremarkable.Given history, exam and workup, low suspicion for HF, ICH (no trauma, headache), seizure (no witnessed seizure like activity, no postictal period, tongue laceration, bladder incontinence), stroke (no focal neuro deficits), HOCM (no murmur, family history of sudden death), ACS (neg troponin, no anginal pain), aortic dissection (no chest pain), malignant arrhythmia on ekg or any family history of sudden death, or GI bleed (stable hgb). Low suspicion for PE given normal  vital signs, absence of chest pain or dyspnea, no evidence of DVT, no recent surgery/immobilization.  Laboratory evaluation is unremarkable for this patient within normal CBC, no evidence of electrolyte abnormalities normal TSH negative troponin negative viral 4 Plex negative repeat 2-hour troponin.  CT head shows evidence of global atrophy but no evidence of acute intracranial hemorrhage, infarction, or mass.  She does have evidence of a urinary tract infection with a white blood cell count of 37 and a leuk esterase.  She does admit to frequent urinary tract infections and after 500 cc bolus of lactated Ringer 's she feels significantly improved.  Shared decision making is held with the family with regards to admission versus discharge.  Patient would prefer to be discharged to home and will stay with family members for close observation.  She was encouraged to drink p.o. fluids and in taking food.  A 3 g dose of fosfomycin is given in the emergency department prior to discharge.  She will have close follow-up with her primary care doctor within the week for reevaluation.  All questions answered and the patient and her family are amenable to the plan be discharged home.    Diagnostic workup as below. Will treat patient with IV fluids and fosfomycin.  Orders Placed This Encounter  Procedures  . Influenza/ RSV/COVID PCR  . Urine Culture  . CT Head Wo Contrast  . Basic Metabolic Panel  . CBC w/ Differential  . TSH  . hsTroponin I (serial 0-2-6H w/ delta)  . hsTroponin I - 2 Hour  . Urinalysis with Microscopy with Culture Reflex  . ECG 12 Lead  . Insert peripheral IV    ED Course ED Course as of 07/12/23 1220  [place]  Jul 11, 2023  1823 CBC w/ Differential(!):   WBC 4.5  RBC 3.86(!)  HGB 12.2  HCT 35.6  MCV 92.1  MCH 31.6  MCHC 34.3  RDW 14.7  MPV 6.4(!)  Platelet 237  nRBC 0  Neutrophils % 51.5  Lymphocytes % 35.0  Monocytes % 8.6  Eosinophils % 4.0  Basophils % 0.9  Absolute  Neutrophils 2.3  Absolute Lymphocytes 1.6  Absolute Monocytes  0.4  Absolute Eosinophils 0.2  Absolute Basophils  0.0 CBC shows no evidence of acute leukocytosis or anemia  2005 Urinalysis with Microscopy with Culture Reflex(!):   Color, UA Light Yellow  Clarity, UA Turbid  Spec Grav, UA 1.010  pH, UA 6.5  Leukocyte Esterase, UA Moderate(!)  Nitrite, UA Negative  Protein, UA Negative  Glucose, UA Negative  Ketones, UA Negative  Urobilinogen, UA <2.0 mg/dL  Bilirubin, UA Negative  Blood, UA Negative  RBC, UA 2  WBC, UA 37(!)  Squam Epithel, UA <1  Bacteria, UA None Seen  Mucus, UA Rare(!) Urinalysis consistent with urinary tract infection.  2020 Patient feeling significantly improved at this time found to have urinalysis on her UA.  She will be given a 3 g dose of fosfomycin orally and will be permitted to be discharged home.    Discussion of Management with other Physicians, QHP or Appropriate Source: Discussed with attending physician Independent Interpretation of Studies: If applicable, documented in ED course above. I have reviewed recent and relevant previous record, including: Outpatient notes - [facility] 05/25/2023 for past medical history Escalation of Care including OBS/Admission/Transfer was considered: However, patient was determined to be appropriate for outpatient management. Social Determinants that significantly affected care: None   Additional History Elements   Chief Complaint Chief Complaint  Patient presents with  . Syncope   Additional Historian(s): a family member  Past Medical History:  Diagnosis Date  . Acute gastrointestinal bleeding 10/25/2014  . Adjustment reaction with anxiety and depression 02/12/2023  . Airway hyperreactivity 10/25/2014   Last Assessment & Plan:   Chronic Controlled cont Advair/albuterol  inhalers    . Altered mental status 09/15/2019  . Anxiety disorder, unspecified 02/19/2021  . Arthritis of knee 10/25/2014  .  Asthma   . Brain mass 09/16/2019  . Breast abscess of female 11/07/2015  . Breast cancer, right (CMS-HCC) 06/17/2016  . Chronic obstructive pulmonary disease, unspecified (CMS-HCC) 09/22/2019  . COPD (chronic obstructive pulmonary disease) (CMS-HCC)   . Difficulty in walking, not elsewhere classified 09/22/2019  . Headache 09/15/2019  . Hyperlipidemia, unspecified 09/22/2019  . Hypertension   . Hypoxemia 10/31/2019  . Major depressive disorder, recurrent, unspecified (CMS-HCC) 09/22/2019  . Meningioma (CMS-HCC) 10/13/2019  . Pulmonary emboli (CMS-HCC) 10/31/2019  . Sepsis secondary to UTI (CMS-HCC) 10/01/2019  . Sepsis, unspecified organism (CMS-HCC) 10/01/2019  . Unspecified osteoarthritis, unspecified site 09/22/2019  . Urinary tract infection 09/15/2019  . Urinary tract infection, site not specified 10/03/2019  . Weakness 09/22/2019    Past Surgical History:  Procedure Laterality Date  . IR EMBOLIZATION INTRACRANIAL SPINAL  09/17/2019   IR EMBOLIZATION INTRACRANIAL SPINAL 09/17/2019 Pat Beatrix President, MD IMG VIR H&V [facility]  . PR EXCIS SUPRATENT MENINGIOMA Right 09/19/2019   Procedure: CRANIECTOMY; EXC MENINGOMA-SUPRATENTORIAL;  Surgeon: Sivakumar Jaikumar, MD;  Location: MAIN OR [facility];  Service: Neurosurgery  . PR IONM 1 ON 1 IN OR W/ATTENDANCE EACH 15 MINUTES N/A 09/19/2019   Procedure: CONTINUOUS INTRAOPERATIVE NEUROPHYSIOLOGY MONITORING IN OR;  Surgeon: Stanly Glow, MD;  Location: MAIN OR [facility];  Service:  Neurosurgery  . PR MICROSURG TECHNIQUES,REQ OPER MICROSCOPE N/A 09/19/2019   Procedure: MICROSURGICAL TECHNIQUES, REQUIRING USE OF OPERATING MICROSCOPE (LIST SEPARATELY IN ADDITION TO CODE FOR PRIMARY PROCEDURE);  Surgeon: Sivakumar Jaikumar, MD;  Location: MAIN OR [facility];  Service: Neurosurgery  . PR STEREOTACTIC COMP ASSIST PROC,CRANIAL,INTRADURAL N/A 09/19/2019   Procedure: STEREOTACTIC COMPUTER-ASSISTED (NAVIGATIONAL) PROCEDURE; CRANIAL, INTRADURAL;  Surgeon: Sivakumar Jaikumar,  MD;  Location: MAIN OR [facility];  Service: Neurosurgery    Allergies Sulfa (sulfonamide antibiotics)  Family History No family history on file.  Social History Social History   Tobacco Use  . Smoking status: Never  . Smokeless tobacco: Never  Substance Use Topics  . Alcohol use: Never  . Drug use: Never     Physical Exam   VITAL SIGNS:    Vitals:   07/11/23 1619 07/11/23 1630 07/11/23 1801 07/11/23 1910  BP:  119/62  130/42  Pulse: 90 91 88 91  Resp: 13 16 17 15   Temp:  37.2 C (99 F)    TempSrc:      SpO2:   94% 93%   Constitutional: Alert and oriented. No acute distress. Eyes: Conjunctivae are normal. HEENT: Normocephalic and atraumatic. No congestion. Moist mucous membranes.  Cardiovascular: Rate as above, regular rhythm. Normal and symmetric distal pulses. Respiratory: Normal respiratory effort. Breath sounds are normal. There are no wheezing or crackles heard. Gastrointestinal: Soft, non-distended, non-tender. Genitourinary: Deferred. Musculoskeletal: Non-tender with normal range of motion in all extremities. Neurologic: Normal speech and language. No gross focal neurologic deficits are appreciated. Patient is moving all extremities equally, face is symmetric at rest and with speech. Skin: Skin is warm, dry and intact. No rash noted. Psychiatric: Mood and affect are normal. Speech and behavior are normal.   Radiology   CT Head Wo Contrast  Final Result  No acute intracranial abnormalities.    Chronic findings as detailed.            Pertinent labs & imaging results that were available during my care of the patient were independently interpreted by me and considered in my medical decision making (see chart for details).  Portions of this record have been created using Scientist, clinical (histocompatibility and immunogenetics). Dictation errors have been sought, but may not have been identified and corrected.  Documentation assistance was provided by Norleen Medora Ley on July 11, 2023  at 4:46 PM for Ozell Doss, MD.  Documentation assistance provided by the scribe. I was present during the time the encounter was recorded. The information recorded by the scribe was done at my direction and has been reviewed and validated by me.      Wilinski, Michael S, DO Resident 07/12/23 1220    Doss Ozell RAMAN, DO Resident 07/12/23 [phone]

## 2023-07-11 NOTE — ED Notes (Signed)
 Bed: 10  Expected date:   Expected time:   Means of arrival:   Comments:  EMS

## 2023-07-13 ENCOUNTER — Telehealth: Payer: Self-pay

## 2023-07-13 NOTE — Transitions of Care (Post Inpatient/ED Visit) (Unsigned)
   07/13/2023  Name: Kirsten Snyder MRN: [id number] DOB: [birth year]  Today's TOC FU Call Status: Today's TOC FU Call Status:: Unsuccessful Call (1st Attempt) Unsuccessful Call (1st Attempt) Date: 07/13/23  Attempted to reach the patient regarding the most recent Inpatient/ED visit.  Follow Up Plan: Additional outreach attempts will be made to reach the patient to complete the Transitions of Care (Post Inpatient/ED visit) call.   Signature Karena Addison, LPN [facility] Nurse Health Advisor Direct Dial [phone]

## 2023-07-14 NOTE — Transitions of Care (Post Inpatient/ED Visit) (Unsigned)
   07/14/2023  Name: Kirsten Snyder MRN: [id number] DOB: [birth year]  Today's TOC FU Call Status: Today's TOC FU Call Status:: Unsuccessful Call (2nd Attempt) Unsuccessful Call (1st Attempt) Date: 07/13/23 Unsuccessful Call (2nd Attempt) Date: 07/14/23  Attempted to reach the patient regarding the most recent Inpatient/ED visit.  Follow Up Plan: Additional outreach attempts will be made to reach the patient to complete the Transitions of Care (Post Inpatient/ED visit) call.   Signature Karena Addison, LPN [facility] Nurse Health Advisor Direct Dial [phone]

## 2023-07-15 NOTE — Transitions of Care (Post Inpatient/ED Visit) (Signed)
   07/15/2023  Name: Kirsten Snyder MRN: [id number] DOB: 07-05-1933  Today's TOC FU Call Status: Today's TOC FU Call Status:: Unsuccessful Call (3rd Attempt) Unsuccessful Call (1st Attempt) Date: 07/13/23 Unsuccessful Call (2nd Attempt) Date: 07/14/23 Unsuccessful Call (3rd Attempt) Date: 07/15/23  Attempted to reach the patient regarding the most recent Inpatient/ED visit.  Follow Up Plan: No further outreach attempts will be made at this time. We have been unable to contact the patient.  Signature Karena Addison, LPN [facility] Nurse Health Advisor Direct Dial [phone]

## 2023-11-11 NOTE — Progress Notes (Signed)
 11/22/2023 9:58 AM   Kirsten Snyder 03-07-1934 [id number]  Referring provider: Joshua Cathryne BROCKS, MD No address on file  Urological history: 1. rUTI's -Contributing factors of age, vaginal atrophy, incontinence and constipation  2. Urge incontinence -contributing factors of age, vaginal atrophy, history of smoking, hypertension, COPD, arthritis, depression and anxiety -insurance would not cover Gemtesa  75 mg daily  -trospium  IR 20 mg BID    HPI: Kirsten Snyder is a 89 y.o. female who presents today for yearly follow-up with her son, Kirsten Snyder.   Previous records reviewed.     She has been taking the trospium  IR 20 mg twice daily.  She wears 1 depends daily.  She is satisfied with her urinary symptoms at today's visit.  Patient denies any modifying or aggravating factors.  Patient denies any recent UTI's, gross hematuria, dysuria or suprapubic/flank pain.  Patient denies any fevers, chills, nausea or vomiting.    She uses a vaginal estrogen cream on a as needed basis.  She states she uses it when she feels sore down there which is not very often.   PMH: Past Medical History:  Diagnosis Date   Allergy    Asthma    Breast cancer (HCC)    Cancer (HCC) 2012   right breast cancer lumpectomy with rad tx   Depression    Edema    GERD (gastroesophageal reflux disease)    Gout    Hyperlipidemia    Hypertension    Personal history of radiation therapy     Surgical History: Past Surgical History:  Procedure Laterality Date   BREAST BIOPSY Bilateral    negative   BREAST EXCISIONAL BIOPSY Right    right positive 04/2010   BREAST LUMPECTOMY Right    removed a cancerous lump   VAGINAL HYSTERECTOMY      Home Medications:  Allergies as of 11/22/2023       Reactions   Sulfa Antibiotics         Medication List        Accurate as of November 22, 2023  9:58 AM. If you have any questions, ask your nurse or doctor.          STOP taking these medications     meclizine  12.5 MG tablet Commonly known as: ANTIVERT        TAKE these medications    albuterol  108 (90 Base) MCG/ACT inhaler Commonly known as: VENTOLIN  HFA Inhale 2 puffs into the lungs every 6 (six) hours as needed for wheezing or shortness of breath. Include aerochamber   allopurinol  100 MG tablet Commonly known as: ZYLOPRIM  TAKE (1) TABLET BY MOUTH EVERY DAY   calcium  carbonate 500 MG chewable tablet Commonly known as: TUMS - dosed in mg elemental calcium  Chew 1 tablet by mouth every 8 (eight) hours as needed for indigestion.   cetirizine 10 MG tablet Commonly known as: ZYRTEC Take 10 mg by mouth daily. For allergies.   Cholecalciferol 50 MCG (2000 UT) Tabs Take 100 mcg by mouth.   DSS 100 MG Caps Take 1 capsule by mouth daily.   escitalopram 20 MG tablet Commonly known as: LEXAPRO Take 20 mg by mouth daily.   estradiol  0.1 MG/GM vaginal cream Commonly known as: ESTRACE  Estrogen Cream Instruction Discard applicator Apply pea sized amount to tip of finger to urethra before bed. Wash hands well after application. Use Monday, Wednesday and Friday   ferrous sulfate  325 (65 FE) MG tablet Take 325 mg by mouth daily with breakfast.  fluticasone  50 MCG/ACT nasal spray Commonly known as: FLONASE  1 spray.   loratadine  10 MG tablet Commonly known as: CLARITIN  Take 1 tablet by mouth daily.   losartan  25 MG tablet Commonly known as: COZAAR  Take 1 by mouth daily   lovastatin  40 MG tablet Commonly known as: MEVACOR  TAKE (1) TABLET BY MOUTH AT BEDTIME   Melatonin 5 MG Caps Take 5 mg by mouth at bedtime.   montelukast  10 MG tablet Commonly known as: SINGULAIR  Take 1 tablet (10 mg total) by mouth at bedtime.   mupirocin  ointment 2 % Commonly known as: BACTROBAN  Apply 1 Application topically 2 (two) times daily.   nystatin  cream Commonly known as: MYCOSTATIN  APPLY 1 APPLICATION TOPICALLY TWICE DAILY   omeprazole  20 MG capsule Commonly known as:  PRILOSEC One a day   polyethylene glycol powder 17 GM/SCOOP powder Commonly known as: GLYCOLAX /MIRALAX  Take 17 g by mouth. What changed: Another medication with the same name was removed. Continue taking this medication, and follow the directions you see here.   sertraline  50 MG tablet Commonly known as: ZOLOFT  Take 1 tablet (50 mg total) by mouth daily.   traZODone  50 MG tablet Commonly known as: DESYREL  TAKE 1 TABLET EVERY DAY AT BEDTIME   Trelegy Ellipta  100-62.5-25 MCG/ACT Aepb Generic drug: Fluticasone -Umeclidin-Vilant Inhale 1 puff into the lungs daily.   trospium  20 MG tablet Commonly known as: SANCTURA  TAKE 1 TABLET BY MOUTH TWICE A DAY        Allergies:  Allergies  Allergen Reactions   Sulfa Antibiotics     Family History: Family History  Problem Relation Age of Onset   Leukemia Mother    Heart disease Father    Breast cancer Neg Hx     Social History:  reports that she has quit smoking. Her smoking use included cigarettes. She has a 0.5 pack-year smoking history. She has been exposed to tobacco smoke. She has never used smokeless tobacco. She reports that she does not currently use alcohol. She reports that she does not use drugs.  ROS: Pertinent ROS in HPI  Physical Exam: BP 134/79 (BP Location: Left Arm, Patient Position: Sitting, Cuff Size: Large)   Pulse 84   Ht 5' 3 (1.6 m)   Wt 197 lb (89.4 kg)   SpO2 94%   BMI 34.90 kg/m   Constitutional:  Well nourished. Alert and oriented, No acute distress. HEENT: [place] AT, moist mucus membranes.  Trachea midline Cardiovascular: No clubbing, cyanosis, or edema. Respiratory: Normal respiratory effort, no increased work of breathing. Neurologic: Grossly intact, no focal deficits, moving all 4 extremities. Psychiatric: Normal mood and affect.     Laboratory Data:  See EPIC and HPI I have reviewed the labs.   Pertinent Imaging: N/A    Assessment & Plan:    1. Urge incontinence -At goal on  medication - Continue trospium  IR 20 mg BID   2.  Vaginal atrophy -continue vaginal estrogen cream applying it prn  Return in about 1 year (around 11/21/2024).  These notes generated with voice recognition software. I apologize for typographical errors.  Kirsten Snyder  [facility] [address]  [address] [place], [place] [address] [phone]

## 2023-11-22 ENCOUNTER — Encounter: Payer: Self-pay | Admitting: Urology

## 2023-11-22 ENCOUNTER — Ambulatory Visit (INDEPENDENT_AMBULATORY_CARE_PROVIDER_SITE_OTHER): Payer: Self-pay | Admitting: Urology

## 2023-11-22 VITALS — BP 134/79 | HR 84 | Ht 63.0 in | Wt 197.0 lb

## 2023-11-22 DIAGNOSIS — N3941 Urge incontinence: Secondary | ICD-10-CM | POA: Diagnosis not present

## 2023-11-22 DIAGNOSIS — N952 Postmenopausal atrophic vaginitis: Secondary | ICD-10-CM

## 2023-12-06 NOTE — Progress Notes (Addendum)
 [facility] Established Patient Clinic Note  Assessment/Plan: Kirsten Snyder is a 89 y.o.female  Problem List Items Addressed This Visit     Arthritis, degenerative   Has chronic degenerative OA, sarcopenia, generalized weakness, gait instability. She is unable to rise from chair w/out using arms. Has slow gait speed w/ rollator (not measured today). She is able to do ADLs w/ minimal assistance, but does not feel comfortable/safe to do certain ADLs without assistance/supervisiton.   Plan:  - Home health PT/OT, aide for gait/balance training, strenghtening exercises, assistance w/ ADLs and med management (reminders to take daily medications)      Relevant Orders   Home Health   Vitamin D deficiency   Having very low energy. Will check levels today       Relevant Orders   Vitamin D 25 Hydroxy (25OH D2 + D3)   Candidal skin infection - Primary   Relevant Medications   nystatin  (MYCOSTATIN ) 100,000 unit/gram powder   Fatigue   Extreme fatigue. No energy to get up and do much around the house or out of the house. Moods are reportedly same. She is on max dose SSRI. She takes a few sedating OTC medications that we did not discuss at length today.   Exam stable from prior No other sick/focal symptoms  Plan:  - labs as below - home health referral placed - consider medication titration for MDD - med review/rec at next visit to discuss all meds including OTC       Relevant Orders   Vitamin D 25 Hydroxy (25OH D2 + D3)   Vitamin B12 (Completed)   CBC (Completed)   Comprehensive Metabolic Panel (Completed)   TSH (Completed)   Falls   Fall since last visit. Uses rollator. Feeling very tired. Not getting up and moving about the house much these days. Will send new referral to home health for PT/OT and HHA - BP was also very low today 88/55, denies orthostasis. Will have HH check BP at home as well to get a trend.   Plan:  - STOP Losartan  - labs as below -  Home health referral placed today       Relevant Orders   Home Health   Chronic cough   Chronic cough, often dry. Sometimes has coughing fits w/ production. No fever, chills, sob. Has asthma/copd on problem list but has not had updated spirometry in current medical chart. Reports albuterol  PRN makes no difference at all. Has hx of GERD and allergies as well.   Plan:  - she is reportedly taking daily allergy pill -- clarify next visit  - resume daily OTC PPI  - consider formal spirometry or alternate inhaler therapy  - f/up 2 weeks       Other Visit Diagnoses       Elevated TSH         Prediabetes           Next Visit:    Subjective Kirsten Snyder is a 89 y.o. female  coming to clinic today for the following issues:  Chief Complaint  Patient presents with  . Rash    Rash underneath stomach and under left breast, stinks, itches   HPI:  F/p  Rash Cough   I have reviewed the problem list, medications, and allergies and have updated/reconciled them if needed.  Kirsten Snyder  reports that she has never smoked. She has never used smokeless tobacco. Health Maintenance  Topic Date Due  . COPD Spirometry  Never done  . Zoster Vaccines (1 of 2) Never done  . COVID-19 Vaccine (3 - 2024-25 season) 12/20/2022  . Medicare Annual Wellness Visit (AWV)  06/28/2023  . Influenza Vaccine (1) 12/20/2023  . Serum Creatinine Monitoring  12/05/2024  . Potassium Monitoring  12/05/2024  . DTaP/Tdap/Td Vaccines (2 - Td or Tdap) 05/19/2025  . DEXA Scan  04/05/2028  . Pneumococcal Vaccine 50+  Completed    Review of Systems  Constitutional:  Positive for malaise/fatigue.  HENT: Negative.    Eyes: Negative.   Respiratory:  Positive for cough.   Cardiovascular: Negative.   Gastrointestinal: Negative.   Genitourinary: Negative.   Musculoskeletal: Negative.   Skin: Negative.   Neurological:  Positive for weakness.  Endo/Heme/Allergies: Negative.   Psychiatric/Behavioral:  Negative for  depression and suicidal ideas. The patient is not nervous/anxious.     Objective  VITALS: BP 88/55 (BP Site: R Arm, BP Position: Sitting, BP Cuff Size: Medium) Comment: pt says bp is not usually this low, but not feeling dizzy  Pulse 74   Temp 36.5 C (97.7 F) (Temporal)   Ht 160 cm (5' 2.99)   BMI 34.45 kg/m   Physical Exam Vitals reviewed.  Constitutional:      Appearance: Normal appearance.  HENT:     Head: Normocephalic and atraumatic.     Right Ear: External ear normal.     Left Ear: External ear normal.     Nose: Nose normal.     Mouth/Throat:     Mouth: Mucous membranes are moist.  Eyes:     Conjunctiva/sclera: Conjunctivae normal.  Cardiovascular:     Rate and Rhythm: Normal rate.  Pulmonary:     Effort: Pulmonary effort is normal. No respiratory distress.     Breath sounds: No stridor. Decreased breath sounds present. No wheezing, rhonchi or rales.  Abdominal:     General: Abdomen is flat.  Musculoskeletal:     Cervical back: Normal range of motion.     Right lower leg: No edema.     Left lower leg: No edema.  Skin:    Findings: Rash (under b/l breasts and pannus) present.  Neurological:     General: No focal deficit present.     Mental Status: She is alert and oriented to person, place, and time.  Psychiatric:        Mood and Affect: Mood normal.        Behavior: Behavior normal.     LABS/IMAGING Lab Results  Component Value Date   TSH 5.104 (H) 12/06/2023   LDL 87 03/22/2023   HDL 48 03/22/2023   NA 143 12/06/2023   K 4.2 12/06/2023   CO2 32.0 (H) 12/06/2023   BUN 8 (L) 12/06/2023   CREATININE 0.72 12/06/2023   EGFR 81 12/06/2023   ALBUMIN 3.6 12/06/2023   AST 15 12/06/2023   ALT 8 (L) 12/06/2023   ALKPHOS 52 12/06/2023   MG 1.8 02/13/2021   WBC 4.8 12/06/2023   HGB 12.5 12/06/2023   HCT 36.8 12/06/2023   PLT 264 12/06/2023   VITDTOTAL 40.1 03/22/2023   VITAMINB12 368 12/06/2023   IRON 99 03/22/2023   TIBC 269 03/22/2023   FERRITIN  30.6 11/01/2019    I personally spent 45 minutes face-to-face and non-face-to-face in the care of this patient, which includes all pre, intra, and post visit time on the date of service.  All documented time was specific to the E/M visit and does not include any procedures that may  have been performed.    [facility] [place]  at [facility] [address], [place], [place] [address] . Telephone [phone] . Fax [phone] [email or web address] Waddell [email or web address]. Lyle, MD Clinical Assistant Professor  [facility]

## 2023-12-15 NOTE — Home Health (Signed)
 SN call for therapy only comprehensive assessment medication review from PT [facility] 12/13/2023 visit completed with patient. Medications and medication interactions reviewed with patient. No severe interactions noted. Patient verbalized clear understanding of all instructions. No further follow-up needed at this time.

## 2023-12-19 ENCOUNTER — Other Ambulatory Visit: Payer: Self-pay | Admitting: Urology

## 2024-02-06 NOTE — Progress Notes (Unsigned)
 02/07/2024 6:46 PM   Kirsten Snyder 02-Apr-1934 [id number]  Referring provider: Blair Waddell Browning, MD [address] [place],  [place] [address]  Urological history: 1. rUTI's -Contributing factors of age, vaginal atrophy, incontinence and constipation - January 04, 2024-mixed urogenital flora - July 11, 2023-mixed urogenital flora - February 11, 2023-mixed urogenital flora  2. Urge incontinence -contributing factors of age, vaginal atrophy, history of smoking, hypertension, COPD, arthritis, depression and anxiety -insurance would not cover Gemtesa  75 mg daily  -trospium  IR 20 mg BID    HPI: Kirsten Snyder is a 89 y.o. woman who presents today for medication not working, having to use the restroom every 2 hours follow-up with her son, Kirsten Snyder.   Previous records reviewed.     They are having (1 to 7) or (8 or more) daytime voids,  they are having nocturia (1-2) or (3 or more) and urgency is (none, mild, strong, severe).   They are having (stress, urge or mixed incontinence.)    they are having urinary leakage (1-2 times weekly, 3 or more times weekly, 1-2 times daily and 3 or more times daily) They are using absorbent products for leakage (no, sometimes, always )   the type of products they use are (panty liners, absorbant pads, depends) *** daily.  They are not limiting fluids.  They are not engaging in toilet mapping  ***  She is having issues with constipation. ***  Sleep apnea ***  UA ***  PVR ***  Serum creatinine (11/2023) 0.72, eGFR 81  Hemoglobin A1c ***  OAB agent Sanctura  IR 20 mg BID   Fluid consumption ***   PMH: Past Medical History:  Diagnosis Date   Allergy    Asthma    Breast cancer (HCC)    Cancer (HCC) 2012   right breast cancer lumpectomy with rad tx   Depression    Edema    GERD (gastroesophageal reflux disease)    Gout    Hyperlipidemia    Hypertension    Personal history of radiation therapy     Surgical  History: Past Surgical History:  Procedure Laterality Date   BREAST BIOPSY Bilateral    negative   BREAST EXCISIONAL BIOPSY Right    right positive 04/2010   BREAST LUMPECTOMY Right    removed a cancerous lump   VAGINAL HYSTERECTOMY      Home Medications:  Allergies as of 02/07/2024       Reactions   Sulfa Antibiotics         Medication List        Accurate as of February 06, 2024  6:46 PM. If you have any questions, ask your nurse or doctor.          albuterol  108 (90 Base) MCG/ACT inhaler Commonly known as: VENTOLIN  HFA Inhale 2 puffs into the lungs every 6 (six) hours as needed for wheezing or shortness of breath. Include aerochamber   allopurinol  100 MG tablet Commonly known as: ZYLOPRIM  TAKE (1) TABLET BY MOUTH EVERY DAY   calcium  carbonate 500 MG chewable tablet Commonly known as: TUMS - dosed in mg elemental calcium  Chew 1 tablet by mouth every 8 (eight) hours as needed for indigestion.   cetirizine 10 MG tablet Commonly known as: ZYRTEC Take 10 mg by mouth daily. For allergies.   Cholecalciferol 50 MCG (2000 UT) Tabs Take 100 mcg by mouth.   DSS 100 MG Caps Take 1 capsule by mouth daily.   escitalopram  20 MG tablet Commonly known as: LEXAPRO Take 20 mg by mouth daily.   estradiol  0.1 MG/GM vaginal cream Commonly known as: ESTRACE  Estrogen Cream Instruction Discard applicator Apply pea sized amount to tip of finger to urethra before bed. Wash hands well after application. Use Monday, Wednesday and Friday   ferrous sulfate  325 (65 FE) MG tablet Take 325 mg by mouth daily with breakfast.   fluticasone  50 MCG/ACT nasal spray Commonly known as: FLONASE  1 spray.   loratadine  10 MG tablet Commonly known as: CLARITIN  Take 1 tablet by mouth daily.   losartan  25 MG tablet Commonly known as: COZAAR  Take 1 by mouth daily   lovastatin  40 MG tablet Commonly known as: MEVACOR  TAKE (1) TABLET BY MOUTH AT BEDTIME   Melatonin 5 MG Caps Take 5 mg by  mouth at bedtime.   montelukast  10 MG tablet Commonly known as: SINGULAIR  Take 1 tablet (10 mg total) by mouth at bedtime.   mupirocin  ointment 2 % Commonly known as: BACTROBAN  Apply 1 Application topically 2 (two) times daily.   nystatin  cream Commonly known as: MYCOSTATIN  APPLY 1 APPLICATION TOPICALLY TWICE DAILY   omeprazole  20 MG capsule Commonly known as: PRILOSEC One a day   polyethylene glycol powder 17 GM/SCOOP powder Commonly known as: GLYCOLAX /MIRALAX  Take 17 g by mouth.   sertraline  50 MG tablet Commonly known as: ZOLOFT  Take 1 tablet (50 mg total) by mouth daily.   traZODone  50 MG tablet Commonly known as: DESYREL  TAKE 1 TABLET EVERY DAY AT BEDTIME   Trelegy Ellipta  100-62.5-25 MCG/ACT Aepb Generic drug: Fluticasone -Umeclidin-Vilant Inhale 1 puff into the lungs daily.   trospium  20 MG tablet Commonly known as: SANCTURA  TAKE 1 TABLET BY MOUTH TWICE A DAY        Allergies:  Allergies  Allergen Reactions   Sulfa Antibiotics     Family History: Family History  Problem Relation Age of Onset   Leukemia Mother    Heart disease Father    Breast cancer Neg Hx     Social History:  reports that she has quit smoking. Her smoking use included cigarettes. She has a 0.5 pack-year smoking history. She has been exposed to tobacco smoke. She has never used smokeless tobacco. She reports that she does not currently use alcohol. She reports that she does not use drugs.  ROS: Pertinent ROS in HPI  Physical Exam: There were no vitals taken for this visit.  Constitutional:  Well nourished. Alert and oriented, No acute distress. HEENT: [place] AT, moist mucus membranes.  Trachea midline, no masses. Cardiovascular: No clubbing, cyanosis, or edema. Respiratory: Normal respiratory effort, no increased work of breathing. GU: No CVA tenderness.  No bladder fullness or masses.  Recession of labia minora, dry, pale vulvar vaginal mucosa and loss of mucosal ridges and folds.   Normal urethral meatus, no lesions, no prolapse, no discharge.   No urethral masses, tenderness and/or tenderness. No bladder fullness, tenderness or masses. *** vagina mucosa, *** estrogen effect, no discharge, no lesions, *** pelvic support, *** cystocele and *** rectocele noted.  No cervical motion tenderness.  Uterus is freely mobile and non-fixed.  No adnexal/parametria masses or tenderness noted.  Anus and perineum are without rashes or lesions.   ***  Neurologic: Grossly intact, no focal deficits, moving all 4 extremities. Psychiatric: Normal mood and affect.    Laboratory Data:  See EPIC and HPI I have reviewed the labs.   Pertinent Imaging: ***   Assessment & Plan:    1.  Urge incontinence - recent urine cultures have been negative - PVR demonstrates adequate emptying ** - constipation *** - should consider a sleep study *** - scheduled voids - decrease in fluid consumption by 25% ***  2. Genitourinary Syndrome of Menopause (GSM)  - Explained that GSM is a common condition that affects women during and after menopause. It is characterized by a cluster of symptoms related to the genital and urinary tracts, including: vaginal dryness, pain or discomfort during intercourse (dyspareunia), burning or irritation in the vulva or vagina, thinning or loss of vaginal tissue, frequent urination, urgency (feeling the need to urinate immediately), incontinence (loss of bladder control), and pain or burning during urination - explained that GSM is primarily caused by the decline in estrogen levels during menopause. This leads to changes in the vaginal tissue, making it thinner, drier, and more susceptible to irritation. Other factors that may contribute to GSM include: Smoking, certain medications (e.g., chemotherapy drugs, and pelvic organ prolapse - advised that First-line treatment options are: Local low-dose vaginal estrogen (cream, ring, tablet), which improves dryness, irritation,  dyspareunia and it is safe for most patients, including those with history of breast cancer (with multidisciplinary input). - advised they can also use vaginal moisturizers and lubricants (alone or combined) and avoid irritants and harsh cleansers.  Pelvic floor physical therapy for dysfunction is also helpful.  Referral to sex therapy or counseling if psychosocial concerns are present. - Reassured that there is no evidence linking local estrogen to breast or endometrial cancer - Explained that it may take up to 8 months of consistent application of the vaginal estrogen cream to cause unnecessary changes needed to address GSM and that this will be a lifelong medication - Will start with vaginal estrogen cream, applying a pea-sized amount with a fingertip just inside the vaginal introitus every night for 30 days and then continuing on to Monday, Wednesday and Friday nights - estradiol  (ESTRACE ) 0.01 % CREA vaginal cream, Apply one pea-sized amount around the opening of the urethra daily for 30 days, then 3 times weekly moving forward, sent to pharmacy      No follow-ups on file.  These notes generated with voice recognition software. I apologize for typographical errors.  Kirsten Snyder  [facility] [address]  [address] [place], [place] [address] [phone]

## 2024-02-07 ENCOUNTER — Ambulatory Visit: Admitting: Urology

## 2024-02-07 ENCOUNTER — Encounter: Payer: Self-pay | Admitting: Urology

## 2024-02-07 VITALS — BP 163/82 | HR 77 | Wt 200.0 lb

## 2024-02-07 DIAGNOSIS — N3941 Urge incontinence: Secondary | ICD-10-CM | POA: Diagnosis not present

## 2024-02-07 DIAGNOSIS — N958 Other specified menopausal and perimenopausal disorders: Secondary | ICD-10-CM | POA: Diagnosis not present

## 2024-02-07 LAB — BLADDER SCAN AMB NON-IMAGING

## 2024-02-07 MED ORDER — GEMTESA 75 MG PO TABS: 75.0000 mg | ORAL_TABLET | Freq: Every day | ORAL | 0 refills | Status: DC

## 2024-03-18 NOTE — Progress Notes (Signed)
 03/20/2024 3:43 PM   Kirsten Snyder [birth year] [id number]  Referring provider: Blair Waddell Browning, MD [address] [place],  [place] [address]  Urological history: 1. rUTI's -Contributing factors of age, vaginal atrophy, incontinence and constipation - February 26, 2024-proteus mirabilis - January 04, 2024-mixed urogenital flora - July 11, 2023-mixed urogenital flora - February 11, 2023-mixed urogenital flora  2. Urge incontinence -contributing factors of age, vaginal atrophy, history of smoking, hypertension, COPD, arthritis, depression and anxiety -insurance would not cover Gemtesa  75 mg daily  -trospium  IR 20 mg BID    HPI: Kirsten Snyder is a 89 y.o. woman who presents today for 6 week follow up with her son, Kirsten Snyder.   Previous records reviewed.     She has seen urogyn since her last appointment.  They have advised her to start with barrier creams to aid with vaginal irritation, discuss vaginal estrogen cream with her oncologist, and was given a prescription for Myrbetriq.    She has been unable to pick up the Myrbetriq, because her insurance will not cover the medication.  She is having 1-7 daytime voids, 3 or more episodes of nocturia and a mild urge to urinate.  She is having urge incontinence that happens 1-2 times daily.  She wears depends daily.  She does not limit fluid intake.  And she does engage in toilet mapping.  Patient denies any modifying or aggravating factors.  Patient denies any recent UTI's, gross hematuria, dysuria or suprapubic/flank pain.  Patient denies any fevers, chills, nausea or vomiting.    PVR 15 mL  Serum creatinine (11/2023) 0.72, eGFR 81  Fluid consumption: She drinks 2, 16 ounce bottles of water daily, Ensure and Diet Coke.   PMH: Past Medical History:  Diagnosis Date   Allergy    Asthma    Breast cancer (HCC)    Cancer (HCC) 2012   right breast cancer lumpectomy with rad tx   Depression    Edema    GERD  (gastroesophageal reflux disease)    Gout    Hyperlipidemia    Hypertension    Personal history of radiation therapy     Surgical History: Past Surgical History:  Procedure Laterality Date   BREAST BIOPSY Bilateral    negative   BREAST EXCISIONAL BIOPSY Right    right positive 04/2010   BREAST LUMPECTOMY Right    removed a cancerous lump   VAGINAL HYSTERECTOMY      Home Medications:  Allergies as of 03/20/2024       Reactions   Sulfa Antibiotics         Medication List        Accurate as of March 20, 2024  3:43 PM. If you have any questions, ask your nurse or doctor.          STOP taking these medications    mirabegron ER 50 MG Tb24 tablet Commonly known as: MYRBETRIQ Stopped by: Krizia Flight   trospium  20 MG tablet Commonly known as: SANCTURA  Stopped by: Stormy Connon       TAKE these medications    albuterol  108 (90 Base) MCG/ACT inhaler Commonly known as: VENTOLIN  HFA Inhale 2 puffs into the lungs every 6 (six) hours as needed for wheezing or shortness of breath. Include aerochamber   allopurinol  100 MG tablet Commonly known as: ZYLOPRIM  TAKE (1) TABLET BY MOUTH EVERY DAY   calcium  carbonate 500 MG chewable tablet Commonly known as: TUMS - dosed in mg elemental  calcium  Chew 1 tablet by mouth every 8 (eight) hours as needed for indigestion.   cetirizine 10 MG tablet Commonly known as: ZYRTEC Take 10 mg by mouth daily. For allergies.   Cholecalciferol 50 MCG (2000 UT) Tabs Take 100 mcg by mouth.   DSS 100 MG Caps Take 1 capsule by mouth daily.   escitalopram 20 MG tablet Commonly known as: LEXAPRO Take 20 mg by mouth daily.   estradiol  0.1 MG/GM vaginal cream Commonly known as: ESTRACE  Estrogen Cream Instruction Discard applicator Apply pea sized amount to tip of finger to urethra before bed. Wash hands well after application. Use Monday, Wednesday and Friday   ferrous sulfate  325 (65 FE) MG tablet Take 325 mg by mouth daily  with breakfast.   fluticasone  50 MCG/ACT nasal spray Commonly known as: FLONASE  1 spray.   Gemtesa  75 MG Tabs Generic drug: Vibegron  Take 1 tablet (75 mg total) by mouth daily. What changed: Another medication with the same name was added. Make sure you understand how and when to take each. Changed by: CLOTILDA CORNWALL   Gemtesa  75 MG Tabs Generic drug: Vibegron  Take 1 tablet (75 mg total) by mouth daily. What changed: You were already taking a medication with the same name, and this prescription was added. Make sure you understand how and when to take each. Changed by: CLOTILDA CORNWALL   levothyroxine 25 MCG tablet Commonly known as: SYNTHROID Take 25 mcg by mouth daily.   loratadine  10 MG tablet Commonly known as: CLARITIN  Take 1 tablet by mouth daily.   losartan  25 MG tablet Commonly known as: COZAAR  Take 1 by mouth daily   lovastatin  40 MG tablet Commonly known as: MEVACOR  TAKE (1) TABLET BY MOUTH AT BEDTIME   Melatonin 5 MG Caps Take 5 mg by mouth at bedtime.   montelukast  10 MG tablet Commonly known as: SINGULAIR  Take 1 tablet (10 mg total) by mouth at bedtime.   mupirocin  ointment 2 % Commonly known as: BACTROBAN  Apply 1 Application topically 2 (two) times daily.   nystatin  cream Commonly known as: MYCOSTATIN  APPLY 1 APPLICATION TOPICALLY TWICE DAILY   omeprazole  20 MG capsule Commonly known as: PRILOSEC One a day   polyethylene glycol powder 17 GM/SCOOP powder Commonly known as: GLYCOLAX /MIRALAX  Take 17 g by mouth.   senna-docusate 8.6-50 MG tablet Commonly known as: Senokot-S Take 1 tablet by mouth every morning.   sertraline  50 MG tablet Commonly known as: ZOLOFT  Take 1 tablet (50 mg total) by mouth daily.   traZODone  50 MG tablet Commonly known as: DESYREL  TAKE 1 TABLET EVERY DAY AT BEDTIME   Trelegy Ellipta  100-62.5-25 MCG/ACT Aepb Generic drug: Fluticasone -Umeclidin-Vilant Inhale 1 puff into the lungs daily.        Allergies:   Allergies  Allergen Reactions   Sulfa Antibiotics     Family History: Family History  Problem Relation Age of Onset   Leukemia Mother    Heart disease Father    Breast cancer Neg Hx     Social History:  reports that she has quit smoking. Her smoking use included cigarettes. She has a 0.5 pack-year smoking history. She has been exposed to tobacco smoke. She has never used smokeless tobacco. She reports that she does not currently use alcohol. She reports that she does not use drugs.  ROS: Pertinent ROS in HPI  Physical Exam: BP 139/85   Pulse 80   Ht 5' 3 (1.6 m)   Wt 197 lb (89.4 kg)   SpO2 91%  BMI 34.90 kg/m   Constitutional:  Well nourished. Alert and oriented, No acute distress. HEENT: [place] AT, moist mucus membranes.  Trachea midline Cardiovascular: No clubbing, cyanosis, or edema. Respiratory: Normal respiratory effort, no increased work of breathing. Neurologic: Grossly intact, no focal deficits, moving all 4 extremities. Psychiatric: Normal mood and affect.    Laboratory Data:  See EPIC and HPI I have reviewed the labs.   Pertinent Imaging:  03/20/24 10:40  Scan Result 15mL    Assessment & Plan:    1. Urge incontinence - recent urine cultures have been negative - PVR demonstrates adequate emptying  - We contacted the pharmacy to see what reason the insurance company gave for not covering the Myrbetriq, pharmacy stated that the insurance company was indicating that she had not tried one of their formulary medications and funny enough they listed Gemtesa  is what is their formulary options, but when I prescribed it in the past it would not cover it, her insurance did state it would cover oxybutynin, so after shared decision making we have decided to send in a prescription for the Gemtesa  and if the insurance is still denying payment for this medication, I will send in a prescription for the oxybutynin, I did discuss the side effects associated with the oxybutynin  and if we have to start the oxybutynin, she will stop the medication if she should experience any of the side effects  2. Genitourinary Syndrome of Menopause (GSM)  - she wants to discuss the use of the vaginal estrogen cream further with her oncologist   Return in about 1 month (around 04/20/2024) for PVR and OAB questionnaire.  These notes generated with voice recognition software. I apologize for typographical errors.  CLOTILDA HELON RIGGERS   [facility] [address]  [address] [place], [place] [address] [phone]

## 2024-03-20 ENCOUNTER — Ambulatory Visit: Admitting: Urology

## 2024-03-20 ENCOUNTER — Telehealth: Payer: Self-pay

## 2024-03-20 ENCOUNTER — Encounter: Payer: Self-pay | Admitting: Urology

## 2024-03-20 VITALS — BP 139/85 | HR 80 | Ht 63.0 in | Wt 197.0 lb

## 2024-03-20 DIAGNOSIS — N958 Other specified menopausal and perimenopausal disorders: Secondary | ICD-10-CM | POA: Diagnosis not present

## 2024-03-20 DIAGNOSIS — N3941 Urge incontinence: Secondary | ICD-10-CM | POA: Diagnosis not present

## 2024-03-20 LAB — BLADDER SCAN AMB NON-IMAGING

## 2024-03-20 MED ORDER — GEMTESA 75 MG PO TABS: 75.0000 mg | ORAL_TABLET | Freq: Every day | ORAL | 1 refills | Status: AC

## 2024-03-20 NOTE — Telephone Encounter (Signed)
 Contacted [facility] @ [phone] in regards to coverage for Myrbetriq, per pharmacist @ [facility] , patient's insurance will not cover Myrbetriq. It is not on the list of medications that they cover. Advised PA S. McGowan that this medication is not covered.  T.Abia Monaco LPN

## 2024-03-20 NOTE — Telephone Encounter (Signed)
 Second call was made to [facility], per [facility] patient insurance will cover Gemtesa , Oxybutynin XR and Tolterodine. Advised PA S. McGowan of medications that are covered per pharmacy.  IVAR Kirks LPN

## 2024-04-15 NOTE — Progress Notes (Incomplete)
 "    04/24/2024 11:45 PM   Kirsten Snyder 12/14/1933 [id number]  Referring provider: Blair Waddell Browning, MD [address] [place],  [place] [address]  Urological history: 1. rUTI's -Contributing factors of age, vaginal atrophy, incontinence and constipation - February 26, 2024-proteus mirabilis - January 04, 2024-mixed urogenital flora - July 11, 2023-mixed urogenital flora - February 11, 2023-mixed urogenital flora  2. Urge incontinence -contributing factors of age, vaginal atrophy, history of smoking, hypertension, COPD, arthritis, depression and anxiety -insurance would not cover Gemtesa  75 mg daily  -trospium  IR 20 mg BID    HPI: Kirsten Snyder is a 89 y.o. woman who presents today for one month follow up with her son, [name].   Previous records reviewed.     They are having (1 to 7) or (8 or more) daytime voids,  they are having nocturia (1-2) or (3 or more) and urgency is (none, mild, strong, severe).   They are having (stress, urge or mixed incontinence.)    they are having urinary leakage (1-2 times weekly, 3 or more times weekly, 1-2 times daily and 3 or more times daily) They are using absorbent products for leakage (no, sometimes, always )   the type of products they use are (panty liners, absorbant pads, depends) *** daily.  They are not limiting fluids.  They are not engaging in toilet mapping  ***  PVR (03/2024) 15 mL  Serum creatinine (11/2023) 0.72, eGFR 81  Fluid consumption: She drinks 2, 16 ounce bottles of water daily, Ensure and Diet Coke.   PMH: Past Medical History:  Diagnosis Date   Allergy    Asthma    Breast cancer (HCC)    Cancer (HCC) 2012   right breast cancer lumpectomy with rad tx   Depression    Edema    GERD (gastroesophageal reflux disease)    Gout    Hyperlipidemia    Hypertension    Personal history of radiation therapy     Surgical History: Past Surgical History:  Procedure Laterality Date   BREAST BIOPSY  Bilateral    negative   BREAST EXCISIONAL BIOPSY Right    right positive 04/2010   BREAST LUMPECTOMY Right    removed a cancerous lump   VAGINAL HYSTERECTOMY      Home Medications:  Allergies as of 04/24/2024       Reactions   Sulfa Antibiotics         Medication List        Accurate as of April 15, 2024 11:45 PM. If you have any questions, ask your nurse or doctor.          albuterol  108 (90 Base) MCG/ACT inhaler Commonly known as: VENTOLIN  HFA Inhale 2 puffs into the lungs every 6 (six) hours as needed for wheezing or shortness of breath. Include aerochamber   allopurinol  100 MG tablet Commonly known as: ZYLOPRIM  TAKE (1) TABLET BY MOUTH EVERY DAY   calcium  carbonate 500 MG chewable tablet Commonly known as: TUMS - dosed in mg elemental calcium  Chew 1 tablet by mouth every 8 (eight) hours as needed for indigestion.   cetirizine 10 MG tablet Commonly known as: ZYRTEC Take 10 mg by mouth daily. For allergies.   escitalopram 20 MG tablet Commonly known as: LEXAPRO Take 20 mg by mouth daily.   estradiol  0.1 MG/GM vaginal cream Commonly known as: ESTRACE  Estrogen Cream Instruction Discard applicator Apply pea sized amount to tip of finger to urethra before bed.  Wash hands well after application. Use Monday, Wednesday and Friday   ferrous sulfate  325 (65 FE) MG tablet Take 325 mg by mouth daily with breakfast.   Gemtesa  75 MG Tabs Generic drug: Vibegron  Take 1 tablet (75 mg total) by mouth daily.   Gemtesa  75 MG Tabs Generic drug: Vibegron  Take 1 tablet (75 mg total) by mouth daily.   levothyroxine 25 MCG tablet Commonly known as: SYNTHROID Take 25 mcg by mouth daily.   loratadine  10 MG tablet Commonly known as: CLARITIN  Take 1 tablet by mouth daily.   losartan  25 MG tablet Commonly known as: COZAAR  Take 1 by mouth daily   lovastatin  40 MG tablet Commonly known as: MEVACOR  TAKE (1) TABLET BY MOUTH AT BEDTIME   Melatonin 5 MG Caps Take 5 mg  by mouth at bedtime.   montelukast  10 MG tablet Commonly known as: SINGULAIR  Take 1 tablet (10 mg total) by mouth at bedtime.   mupirocin  ointment 2 % Commonly known as: BACTROBAN  Apply 1 Application topically 2 (two) times daily.   nystatin  cream Commonly known as: MYCOSTATIN  APPLY 1 APPLICATION TOPICALLY TWICE DAILY   omeprazole  20 MG capsule Commonly known as: PRILOSEC One a day   senna-docusate 8.6-50 MG tablet Commonly known as: Senokot-S Take 1 tablet by mouth every morning.   sertraline  50 MG tablet Commonly known as: ZOLOFT  Take 1 tablet (50 mg total) by mouth daily.   traZODone  50 MG tablet Commonly known as: DESYREL  TAKE 1 TABLET EVERY DAY AT BEDTIME   Trelegy Ellipta  100-62.5-25 MCG/ACT Aepb Generic drug: Fluticasone -Umeclidin-Vilant Inhale 1 puff into the lungs daily.        Allergies:  Allergies  Allergen Reactions   Sulfa Antibiotics     Family History: Family History  Problem Relation Age of Onset   Leukemia Mother    Heart disease Father    Breast cancer Neg Hx     Social History:  reports that she has quit smoking. Her smoking use included cigarettes. She has a 0.5 pack-year smoking history. She has been exposed to tobacco smoke. She has never used smokeless tobacco. She reports that she does not currently use alcohol. She reports that she does not use drugs.  ROS: Pertinent ROS in HPI  Physical Exam: There were no vitals taken for this visit.  Constitutional:  Well nourished. Alert and oriented, No acute distress. HEENT: [place] AT, moist mucus membranes.  Trachea midline, no masses. Cardiovascular: No clubbing, cyanosis, or edema. Respiratory: Normal respiratory effort, no increased work of breathing. GU: No CVA tenderness.  No bladder fullness or masses.  Recession of labia minora, dry, pale vulvar vaginal mucosa and loss of mucosal ridges and folds.  Normal urethral meatus, no lesions, no prolapse, no discharge.   No urethral masses,  tenderness and/or tenderness. No bladder fullness, tenderness or masses. *** vagina mucosa, *** estrogen effect, no discharge, no lesions, *** pelvic support, *** cystocele and *** rectocele noted.  No cervical motion tenderness.  Uterus is freely mobile and non-fixed.  No adnexal/parametria masses or tenderness noted.  Anus and perineum are without rashes or lesions.   ***  Neurologic: Grossly intact, no focal deficits, moving all 4 extremities. Psychiatric: Normal mood and affect.    Laboratory Data:  See EPIC and HPI I have reviewed the labs.   Pertinent Imaging:  03/20/24 10:40  Scan Result 15mL    Assessment & Plan:    1. Urge incontinence - ***  2. Genitourinary Syndrome of Menopause (GSM)  - ***  No follow-ups on file.  These notes generated with voice recognition software. I apologize for typographical errors.  CLOTILDA HELON RIGGERS  [facility] [address]  [address] [place], [place] [address] [phone]  "

## 2024-04-15 NOTE — Progress Notes (Signed)
 "    04/24/2024 10:25 AM   Kirsten Snyder [birth year] [id number]  Referring provider: Blair Waddell Browning, MD [address] [place],  [place] [address]  Urological history: 1. rUTI's -Contributing factors of age, vaginal atrophy, incontinence and constipation - February 26, 2024-proteus mirabilis - January 04, 2024-mixed urogenital flora - July 11, 2023-mixed urogenital flora - February 11, 2023-mixed urogenital flora  2. Urge incontinence -contributing factors of age, vaginal atrophy, history of smoking, hypertension, COPD, arthritis, depression and anxiety -insurance would not cover Gemtesa  75 mg daily  -failed trospium  IR 20 mg BID    HPI: Kirsten Snyder is a 89 y.o. woman who presents today for one month follow up with her son, Kirsten Snyder.   Previous records reviewed.     She is having 1-7 daytime voids, 3 more episodes of nocturia, with a mild urge to urinate.  She does have urge incontinence leaking 3 more times a day.  She wears 5 depends daily when she does engages in toilet mapping.  She states that she has daytime urgency and frequency, but she only urinates a small amount.  She states it burns when she urinates.  She states she feels that the burning is internal.  She has not been applying the vaginal estrogen cream.  At night, she states she has to pack herself.  Meaning that she has to wear depends reinforced with incontinent pads and lay towels on the bed to avoid soiling the mattress.  She endorses pedal edema.    PVR (04/2024) 0 mL  Serum creatinine (11/2023) 0.72, eGFR 81  Fluid consumption: She drinks 2, 16 ounce bottles of water daily, Ensure and Diet Coke.   PMH: Past Medical History:  Diagnosis Date   Allergy    Asthma    Breast cancer (HCC)    Cancer (HCC) 2012   right breast cancer lumpectomy with rad tx   Depression    Edema    GERD (gastroesophageal reflux disease)    Gout    Hyperlipidemia    Hypertension    Personal history  of radiation therapy     Surgical History: Past Surgical History:  Procedure Laterality Date   BREAST BIOPSY Bilateral    negative   BREAST EXCISIONAL BIOPSY Right    right positive 04/2010   BREAST LUMPECTOMY Right    removed a cancerous lump   VAGINAL HYSTERECTOMY      Home Medications:  Allergies as of 04/24/2024       Reactions   Sulfa Antibiotics         Medication List        Accurate as of April 24, 2024 10:25 AM. If you have any questions, ask your nurse or doctor.          albuterol  108 (90 Base) MCG/ACT inhaler Commonly known as: VENTOLIN  HFA Inhale 2 puffs into the lungs every 6 (six) hours as needed for wheezing or shortness of breath. Include aerochamber   allopurinol  100 MG tablet Commonly known as: ZYLOPRIM  TAKE (1) TABLET BY MOUTH EVERY DAY   calcium  carbonate 500 MG chewable tablet Commonly known as: TUMS - dosed in mg elemental calcium  Chew 1 tablet by mouth every 8 (eight) hours as needed for indigestion.   cetirizine 10 MG tablet Commonly known as: ZYRTEC Take 10 mg by mouth daily. For allergies.   escitalopram 20 MG tablet Commonly known as: LEXAPRO Take 20 mg by mouth daily.   estradiol  0.1 MG/GM vaginal cream  Commonly known as: ESTRACE  Estrogen Cream Instruction Discard applicator Apply pea sized amount to tip of finger to urethra before bed. Wash hands well after application. Use Monday, Wednesday and Friday   ferrous sulfate  325 (65 FE) MG tablet Take 325 mg by mouth daily with breakfast.   Gemtesa  75 MG Tabs Generic drug: Vibegron  Take 1 tablet (75 mg total) by mouth daily.   Gemtesa  75 MG Tabs Generic drug: Vibegron  Take 1 tablet (75 mg total) by mouth daily.   levothyroxine 25 MCG tablet Commonly known as: SYNTHROID Take 25 mcg by mouth daily.   loratadine  10 MG tablet Commonly known as: CLARITIN  Take 1 tablet by mouth daily.   losartan  25 MG tablet Commonly known as: COZAAR  Take 1 by mouth daily   lovastatin   40 MG tablet Commonly known as: MEVACOR  TAKE (1) TABLET BY MOUTH AT BEDTIME   Melatonin 5 MG Caps Take 5 mg by mouth at bedtime.   montelukast  10 MG tablet Commonly known as: SINGULAIR  Take 1 tablet (10 mg total) by mouth at bedtime.   mupirocin  ointment 2 % Commonly known as: BACTROBAN  Apply 1 Application topically 2 (two) times daily.   nystatin  cream Commonly known as: MYCOSTATIN  APPLY 1 APPLICATION TOPICALLY TWICE DAILY   omeprazole  20 MG capsule Commonly known as: PRILOSEC One a day   senna-docusate 8.6-50 MG tablet Commonly known as: Senokot-S Take 1 tablet by mouth every morning.   sertraline  50 MG tablet Commonly known as: ZOLOFT  Take 1 tablet (50 mg total) by mouth daily.   traZODone  50 MG tablet Commonly known as: DESYREL  TAKE 1 TABLET EVERY DAY AT BEDTIME   Trelegy Ellipta  100-62.5-25 MCG/ACT Aepb Generic drug: Fluticasone -Umeclidin-Vilant Inhale 1 puff into the lungs daily.        Allergies:  Allergies  Allergen Reactions   Sulfa Antibiotics     Family History: Family History  Problem Relation Age of Onset   Leukemia Mother    Heart disease Father    Breast cancer Neg Hx     Social History:  reports that she has quit smoking. Her smoking use included cigarettes. She has a 0.5 pack-year smoking history. She has been exposed to tobacco smoke. She has never used smokeless tobacco. She reports that she does not currently use alcohol. She reports that she does not use drugs.  ROS: Pertinent ROS in HPI  Physical Exam: BP 124/73   Pulse 76   Wt 197 lb (89.4 kg)   SpO2 93%   BMI 34.90 kg/m   Constitutional:  Well nourished. Alert and oriented, No acute distress. HEENT: Kirsten Snyder AT, moist mucus membranes.  Trachea midline Cardiovascular: No clubbing, cyanosis, or edema. Respiratory: Normal respiratory effort, no increased work of breathing. Neurologic: Grossly intact, no focal deficits, moving all 4 extremities. Psychiatric: Normal mood and  affect.    Laboratory Data:  See EPIC and HPI I have reviewed the labs.   Pertinent Imaging:  03/20/24 10:40  Scan Result 15mL    Assessment & Plan:    1. Urge incontinence/dysuria - Has been refractory to Tropium and Gemtesa  - Will schedule cystoscopy to evaluate the internal bladder anatomy due to her refractory incontinence and dysuria, but her symptoms may likely be the cause of GSM, so hopefully she will be more consistent with the application of the vaginal estrogen cream in the future - We did discuss a trial of oxybutynin, but after discussion with her regarding the side effects, she does not want to pursue the medication at  this time - Patient left the office before submitting UA, so we will contact her to bring back a specimen  2. Genitourinary Syndrome of Menopause (GSM)  - Encouraged her to apply vaginal estrogen cream 3 nights weekly and reassured her that her chances of malignancy with the use of estrogen cream are very slim  3. Nocturia - I explained to the patient that nocturia is often multi-factorial and difficult to treat.  Sleeping disorders, heart conditions, peripheral vascular disease, diabetes, an enlarged prostate for men, an urethral stricture causing bladder outlet obstruction and/or certain medications can contribute to nocturia. - She does experience pedal edema and has not been elevating her feet and I explained that may be contributing to some of her nighttime incontinence and nocturia, her and her son seems somewhat resistant to this as she has to make frequent trips to the restroom's during the day, but I explained that since she empties her bladder during the day and she is admitting she only urinates small amounts during the day, an over active bladder is only contributing small amount to her night time issues    Return for cysto for refractory urge incontinence and dysuria .  These notes generated with voice recognition software. I apologize for  typographical errors.  CLOTILDA HELON RIGGERS  [facility] [address]  [address] [place], [place] [address] [phone]  "

## 2024-04-24 ENCOUNTER — Telehealth: Payer: Self-pay

## 2024-04-24 ENCOUNTER — Encounter: Payer: Self-pay | Admitting: Urology

## 2024-04-24 ENCOUNTER — Ambulatory Visit: Payer: Medicare (Managed Care) | Admitting: Urology

## 2024-04-24 VITALS — BP 124/73 | HR 76 | Wt 197.0 lb

## 2024-04-24 DIAGNOSIS — R351 Nocturia: Secondary | ICD-10-CM

## 2024-04-24 DIAGNOSIS — R300 Dysuria: Secondary | ICD-10-CM | POA: Diagnosis not present

## 2024-04-24 DIAGNOSIS — N958 Other specified menopausal and perimenopausal disorders: Secondary | ICD-10-CM | POA: Diagnosis not present

## 2024-04-24 DIAGNOSIS — N3941 Urge incontinence: Secondary | ICD-10-CM

## 2024-04-24 LAB — BLADDER SCAN AMB NON-IMAGING

## 2024-04-24 NOTE — Telephone Encounter (Signed)
 Spoke with DPR ETTER [name] Chancy , pt son) patient son will pick up a urine cup tomorrow for UA and UCX. Per provider request.  Andrea Kirks Lpn

## 2024-04-24 NOTE — Patient Instructions (Signed)

## 2024-04-25 LAB — MICROSCOPIC EXAMINATION
Epithelial Cells (non renal): >10 /HPF — AB (ref 0–10)
WBC, UA: >30 /HPF — AB (ref 0–5)

## 2024-04-25 LAB — URINALYSIS, COMPLETE
Bilirubin, UA: NEGATIVE
Glucose, UA: NEGATIVE
Ketones, UA: NEGATIVE
Nitrite, UA: POSITIVE — AB
Protein,UA: NEGATIVE
Specific Gravity, UA: 1.015 (ref 1.005–1.030)
Urobilinogen, Ur: 1 mg/dL (ref 0.2–1.0)
pH, UA: 6.5 (ref 5.0–7.5)

## 2024-04-28 ENCOUNTER — Ambulatory Visit: Payer: Self-pay | Admitting: Urology

## 2024-04-28 DIAGNOSIS — R300 Dysuria: Secondary | ICD-10-CM

## 2024-04-28 LAB — CULTURE, URINE COMPREHENSIVE

## 2024-04-28 MED ORDER — AMOXICILLIN-POT CLAVULANATE 875-125 MG PO TABS: 1.0000 | ORAL_TABLET | Freq: Two times a day (BID) | ORAL | 0 refills | Status: AC

## 2024-05-16 ENCOUNTER — Ambulatory Visit: Payer: Medicare (Managed Care) | Admitting: Urology

## 2024-05-16 VITALS — BP 135/77 | HR 83 | Ht 63.0 in | Wt 197.0 lb

## 2024-05-16 DIAGNOSIS — N3941 Urge incontinence: Secondary | ICD-10-CM

## 2024-05-16 MED ORDER — LIDOCAINE HCL URETHRAL/MUCOSAL 2 % EX GEL
1.0000 | Freq: Once | CUTANEOUS | Status: AC
  Administered 2024-05-16: 1 via URETHRAL

## 2024-05-16 MED ORDER — CIPROFLOXACIN HCL 500 MG PO TABS
500.0000 mg | ORAL_TABLET | Freq: Once | ORAL | Status: AC
  Administered 2024-05-16: 500 mg via ORAL

## 2024-05-16 NOTE — Patient Instructions (Signed)

## 2024-05-16 NOTE — Progress Notes (Signed)
 Cystoscopy Procedure Note:  Indication: Dysuria, urgency/frequency  She has been followed extensively by Clotilda Cornwall, PA over the last 2 years for recurrent UTI, GSM, OAB/urge incontinence  Recently treated for E. coli and Proteus in the urine, however urinalysis contaminated showed with greater than 10 epithelial cells  Cipro  given for prophylaxis  After informed consent and discussion of the procedure and its risks, Kirsten Snyder was positioned and prepped in the standard fashion. Cystoscopy was performed with a flexible cystoscope. The urethra, bladder neck and entire bladder was visualized in a standard fashion.The ureteral orifices were visualized in their normal location and orientation.  Decompressed bladder, did not fill well, suspect low bladder capacity, no suspicious lesions.  Findings: No suspicious lesions, suspect low volume bladder  ------------------------------------------------  Assessment and Plan: 89 year old comorbid female with overactive symptoms urgency, urge incontinence, nocturia.  Likely multifactorial, especially overnight with lower extremity edema and fluid intake.  I think we need to have realistic expectations with her age and comorbidities  She has failed oral medications for OAB.  I think reasonable to trial PTNS and risks and benefits discussed and she was interested.  We also discussed minimizing fluids in the evening, lower extremity compression socks, elevation of the legs in the mid afternoon to help with nocturia  Schedule PTNS Continue follow-up with Clotilda Redell Burnet, MD 05/16/2024

## 2024-11-27 ENCOUNTER — Ambulatory Visit: Admitting: Urology
